# Patient Record
Sex: Female | Born: 1935 | Race: White | Hispanic: No | Marital: Single | Smoking: Never smoker
Health system: Southern US, Community
[De-identification: ages and names within clinical notes are randomized; demographics above are authoritative.]

## PROBLEM LIST (undated history)

## (undated) DIAGNOSIS — I480 Paroxysmal atrial fibrillation: Secondary | ICD-10-CM

## (undated) DIAGNOSIS — C801 Malignant (primary) neoplasm, unspecified: Secondary | ICD-10-CM

## (undated) DIAGNOSIS — I495 Sick sinus syndrome: Secondary | ICD-10-CM

## (undated) DIAGNOSIS — H3500 Unspecified background retinopathy: Secondary | ICD-10-CM

## (undated) DIAGNOSIS — E785 Hyperlipidemia, unspecified: Secondary | ICD-10-CM

## (undated) DIAGNOSIS — R413 Other amnesia: Secondary | ICD-10-CM

## (undated) DIAGNOSIS — R42 Dizziness and giddiness: Secondary | ICD-10-CM

## (undated) DIAGNOSIS — Z950 Presence of cardiac pacemaker: Secondary | ICD-10-CM

## (undated) DIAGNOSIS — N183 Chronic kidney disease, stage 3 unspecified: Secondary | ICD-10-CM

## (undated) DIAGNOSIS — I119 Hypertensive heart disease without heart failure: Secondary | ICD-10-CM

## (undated) DIAGNOSIS — E119 Type 2 diabetes mellitus without complications: Secondary | ICD-10-CM

## (undated) DIAGNOSIS — A419 Sepsis, unspecified organism: Secondary | ICD-10-CM

## (undated) DIAGNOSIS — I10 Essential (primary) hypertension: Secondary | ICD-10-CM

## (undated) DIAGNOSIS — H547 Unspecified visual loss: Secondary | ICD-10-CM

## (undated) HISTORY — DX: Chronic kidney disease, stage 3 (moderate): N18.3

## (undated) HISTORY — DX: Dizziness and giddiness: R42

## (undated) HISTORY — DX: Hyperlipidemia, unspecified: E78.5

## (undated) HISTORY — DX: Chronic kidney disease, stage 3 unspecified: N18.30

## (undated) HISTORY — DX: Hypertensive heart disease without heart failure: I11.9

## (undated) HISTORY — DX: Sick sinus syndrome: I49.5

## (undated) HISTORY — DX: Other amnesia: R41.3

## (undated) HISTORY — PX: DENTAL SURGERY: SHX609

## (undated) HISTORY — DX: Unspecified background retinopathy: H35.00

## (undated) HISTORY — DX: Type 2 diabetes mellitus without complications: E11.9

## (undated) HISTORY — PX: EYE SURGERY: SHX253

---

## 2010-05-11 ENCOUNTER — Encounter: Payer: Self-pay | Admitting: Cardiovascular Disease

## 2010-05-13 ENCOUNTER — Encounter: Payer: Self-pay | Admitting: Cardiovascular Disease

## 2010-07-30 ENCOUNTER — Encounter: Payer: Self-pay | Admitting: Cardiovascular Disease

## 2011-04-01 ENCOUNTER — Encounter: Payer: Self-pay | Admitting: Cardiovascular Disease

## 2011-04-17 ENCOUNTER — Encounter: Payer: Self-pay | Admitting: *Deleted

## 2011-04-17 ENCOUNTER — Encounter: Payer: Self-pay | Admitting: Cardiovascular Disease

## 2011-04-18 ENCOUNTER — Encounter: Payer: Self-pay | Admitting: Cardiovascular Disease

## 2011-04-18 ENCOUNTER — Ambulatory Visit (INDEPENDENT_AMBULATORY_CARE_PROVIDER_SITE_OTHER): Payer: Medicare Other | Admitting: Cardiovascular Disease

## 2011-04-18 VITALS — BP 165/80 | HR 69 | Resp 14 | Ht 62.0 in | Wt 146.0 lb

## 2011-04-18 DIAGNOSIS — R946 Abnormal results of thyroid function studies: Secondary | ICD-10-CM | POA: Insufficient documentation

## 2011-04-18 DIAGNOSIS — I119 Hypertensive heart disease without heart failure: Secondary | ICD-10-CM | POA: Insufficient documentation

## 2011-04-18 DIAGNOSIS — I480 Paroxysmal atrial fibrillation: Secondary | ICD-10-CM

## 2011-04-18 DIAGNOSIS — I10 Essential (primary) hypertension: Secondary | ICD-10-CM

## 2011-04-18 DIAGNOSIS — I4891 Unspecified atrial fibrillation: Secondary | ICD-10-CM

## 2011-04-18 NOTE — Progress Notes (Signed)
77 yo referred by Dr Johnnye Sima.  Previously seen by cardiologist in [facility].  Moved here with demented sister as neither one of them drive and their brother Barnabas Lister lives here.  Reviewed some records from [facility]  Had normal echo.  ? History of PAF.  On amiodarone with suppressed TSH.  No history of CAD or structural heart disease.  Denies palpiatations, SSCP, dyspnea or syncope.  Not on coumadin.  Discussed long term side effects of Amiodarone and especially since TSH abnormal would stop this.  If any evidence of recurrence of afib a drug like flecainide would be better  ROS: Denies fever, malais, weight loss, blurry vision, decreased visual acuity, cough, sputum, SOB, hemoptysis, pleuritic pain, palpitaitons, heartburn, abdominal pain, melena, lower extremity edema, claudication, or rash.  All other systems reviewed and negative   General: Affect appropriate Healthy:  appears stated age 77: normal Neck supple with no adenopathy JVP normal no bruits no thyromegaly Lungs clear with no wheezing and good diaphragmatic motion Heart:  S1/S2 no murmur,rub, gallop or click PMI normal Abdomen: benighn, BS positve, no tenderness, no AAA no bruit.  No HSM or HJR Distal pulses intact with no bruits No edema Neuro non-focal Skin warm and dry No muscular weakness  Medications Current Outpatient Prescriptions  Medication Sig Dispense Refill  . acarbose (PRECOSE) 100 MG tablet Take 100 mg by mouth daily.        Marland Kitchen amLODipine (NORVASC) 5 MG tablet Take 5 mg by mouth daily.        Marland Kitchen glimepiride (AMARYL) 1 MG tablet Take 1 mg by mouth daily before breakfast.        . losartan (COZAAR) 100 MG tablet Take 100 mg by mouth daily.        . metFORMIN (GLUMETZA) 500 MG (MOD) 24 hr tablet Take 500 mg by mouth 2 (two) times daily with a meal.        . metoprolol (TOPROL-XL) 200 MG 24 hr tablet 1/2 tab po qd       . pravastatin (PRAVACHOL) 40 MG tablet Take 40 mg by mouth daily.        Marland Kitchen  telmisartan (MICARDIS) 40 MG tablet Take 40 mg by mouth daily.        Marland Kitchen DISCONTD: amiodarone (PACERONE) 200 MG tablet Take 200 mg by mouth daily.        Marland Kitchen DISCONTD: b complex vitamins capsule Take 1 capsule by mouth daily.        Marland Kitchen DISCONTD: telmisartan (MICARDIS) 40 MG tablet Take 40 mg by mouth daily.          Allergies Review of patient's allergies indicates no known allergies.  Family History: Family History  Problem Relation Age of Onset  . Heart attack Father   . Hypertension Mother   . Cancer Other     Social History: History   Social History  . Marital Status: Married    Spouse Name: N/A    Number of Children: N/A  . Years of Education: N/A   Occupational History  . Not on file.   Social History Main Topics  . Smoking status: Never Smoker   . Smokeless tobacco: Not on file  . Alcohol Use: Not on file  . Drug Use: Not on file  . Sexually Active: Not on file   Other Topics Concern  . Not on file   Social History Narrative  . No narrative on file    Electrocardiogram: NSR 69 PR 232  otherwise normal  Assessment and Plan

## 2011-04-18 NOTE — Patient Instructions (Signed)
Your physician wants you to follow-up in: 6 months You will receive a reminder letter in the mail two months in advance. If you don't receive a letter, please call our office to schedule the follow-up appointment.   STOP PACERONE

## 2011-04-18 NOTE — Assessment & Plan Note (Signed)
Continue calcium blocker and beta blocker May need to change in future to ACE given tendency to brady and long PR

## 2011-04-18 NOTE — Assessment & Plan Note (Signed)
Continue ASA and BB,  Stop amiodarone.

## 2011-04-18 NOTE — Assessment & Plan Note (Signed)
Repeat in 3 months once amiodarone has been stopped today

## 2011-11-14 DIAGNOSIS — Z79899 Other long term (current) drug therapy: Secondary | ICD-10-CM | POA: Diagnosis not present

## 2011-11-14 DIAGNOSIS — I10 Essential (primary) hypertension: Secondary | ICD-10-CM | POA: Diagnosis not present

## 2011-11-14 DIAGNOSIS — E559 Vitamin D deficiency, unspecified: Secondary | ICD-10-CM | POA: Diagnosis not present

## 2011-11-14 DIAGNOSIS — Z1212 Encounter for screening for malignant neoplasm of rectum: Secondary | ICD-10-CM | POA: Diagnosis not present

## 2011-11-14 DIAGNOSIS — E119 Type 2 diabetes mellitus without complications: Secondary | ICD-10-CM | POA: Diagnosis not present

## 2011-11-14 DIAGNOSIS — E782 Mixed hyperlipidemia: Secondary | ICD-10-CM | POA: Diagnosis not present

## 2011-11-25 DIAGNOSIS — E1139 Type 2 diabetes mellitus with other diabetic ophthalmic complication: Secondary | ICD-10-CM | POA: Diagnosis not present

## 2011-11-25 DIAGNOSIS — E11349 Type 2 diabetes mellitus with severe nonproliferative diabetic retinopathy without macular edema: Secondary | ICD-10-CM | POA: Diagnosis not present

## 2011-11-25 DIAGNOSIS — H4310 Vitreous hemorrhage, unspecified eye: Secondary | ICD-10-CM | POA: Diagnosis not present

## 2011-11-25 DIAGNOSIS — E11359 Type 2 diabetes mellitus with proliferative diabetic retinopathy without macular edema: Secondary | ICD-10-CM | POA: Diagnosis not present

## 2012-02-27 DIAGNOSIS — E119 Type 2 diabetes mellitus without complications: Secondary | ICD-10-CM | POA: Diagnosis not present

## 2012-02-27 DIAGNOSIS — E559 Vitamin D deficiency, unspecified: Secondary | ICD-10-CM | POA: Diagnosis not present

## 2012-02-27 DIAGNOSIS — Z79899 Other long term (current) drug therapy: Secondary | ICD-10-CM | POA: Diagnosis not present

## 2012-02-27 DIAGNOSIS — I4891 Unspecified atrial fibrillation: Secondary | ICD-10-CM | POA: Diagnosis not present

## 2012-02-27 DIAGNOSIS — I10 Essential (primary) hypertension: Secondary | ICD-10-CM | POA: Diagnosis not present

## 2012-02-27 DIAGNOSIS — E782 Mixed hyperlipidemia: Secondary | ICD-10-CM | POA: Diagnosis not present

## 2012-03-11 DIAGNOSIS — E1139 Type 2 diabetes mellitus with other diabetic ophthalmic complication: Secondary | ICD-10-CM | POA: Diagnosis not present

## 2012-03-11 DIAGNOSIS — H35439 Paving stone degeneration of retina, unspecified eye: Secondary | ICD-10-CM | POA: Diagnosis not present

## 2012-03-11 DIAGNOSIS — E11359 Type 2 diabetes mellitus with proliferative diabetic retinopathy without macular edema: Secondary | ICD-10-CM | POA: Diagnosis not present

## 2012-03-11 DIAGNOSIS — E11349 Type 2 diabetes mellitus with severe nonproliferative diabetic retinopathy without macular edema: Secondary | ICD-10-CM | POA: Diagnosis not present

## 2012-04-19 ENCOUNTER — Encounter (HOSPITAL_COMMUNITY): Payer: Self-pay

## 2012-04-19 ENCOUNTER — Inpatient Hospital Stay (HOSPITAL_COMMUNITY)
Admission: EM | Admit: 2012-04-19 | Discharge: 2012-04-21 | DRG: 243 | Disposition: A | Discharge status: Home / Self Care | Payer: Medicare Other | Attending: Internal Medicine | Admitting: Internal Medicine

## 2012-04-19 ENCOUNTER — Emergency Department (HOSPITAL_COMMUNITY): Payer: Medicare Other

## 2012-04-19 DIAGNOSIS — E785 Hyperlipidemia, unspecified: Secondary | ICD-10-CM | POA: Diagnosis not present

## 2012-04-19 DIAGNOSIS — I10 Essential (primary) hypertension: Secondary | ICD-10-CM | POA: Diagnosis not present

## 2012-04-19 DIAGNOSIS — I495 Sick sinus syndrome: Principal | ICD-10-CM

## 2012-04-19 DIAGNOSIS — R404 Transient alteration of awareness: Secondary | ICD-10-CM | POA: Diagnosis not present

## 2012-04-19 DIAGNOSIS — R42 Dizziness and giddiness: Secondary | ICD-10-CM | POA: Diagnosis not present

## 2012-04-19 DIAGNOSIS — I4892 Unspecified atrial flutter: Secondary | ICD-10-CM | POA: Diagnosis present

## 2012-04-19 DIAGNOSIS — R55 Syncope and collapse: Secondary | ICD-10-CM | POA: Diagnosis not present

## 2012-04-19 DIAGNOSIS — I4891 Unspecified atrial fibrillation: Secondary | ICD-10-CM | POA: Diagnosis present

## 2012-04-19 DIAGNOSIS — J9819 Other pulmonary collapse: Secondary | ICD-10-CM | POA: Diagnosis not present

## 2012-04-19 DIAGNOSIS — R0602 Shortness of breath: Secondary | ICD-10-CM | POA: Diagnosis not present

## 2012-04-19 DIAGNOSIS — E119 Type 2 diabetes mellitus without complications: Secondary | ICD-10-CM | POA: Diagnosis present

## 2012-04-19 DIAGNOSIS — Z950 Presence of cardiac pacemaker: Secondary | ICD-10-CM

## 2012-04-19 DIAGNOSIS — I499 Cardiac arrhythmia, unspecified: Secondary | ICD-10-CM | POA: Diagnosis not present

## 2012-04-19 DIAGNOSIS — I480 Paroxysmal atrial fibrillation: Secondary | ICD-10-CM

## 2012-04-19 HISTORY — DX: Paroxysmal atrial fibrillation: I48.0

## 2012-04-19 LAB — DIFFERENTIAL
Basophils Absolute: 0 K/uL (ref 0.0–0.1)
Basophils Relative: 0 % (ref 0–1)
Eosinophils Absolute: 0.1 K/uL (ref 0.0–0.7)
Eosinophils Relative: 2 % (ref 0–5)
Lymphocytes Relative: 20 % (ref 12–46)
Lymphs Abs: 1.3 K/uL (ref 0.7–4.0)
Monocytes Absolute: 0.4 K/uL (ref 0.1–1.0)
Monocytes Relative: 6 % (ref 3–12)
Neutro Abs: 4.9 K/uL (ref 1.7–7.7)
Neutrophils Relative %: 72 % (ref 43–77)

## 2012-04-19 LAB — COMPREHENSIVE METABOLIC PANEL
ALT: 13 U/L (ref 0–35)
AST: 14 U/L (ref 0–37)
CO2: 22 mEq/L (ref 19–32)
Chloride: 107 mEq/L (ref 96–112)
Creatinine, Ser: 0.92 mg/dL (ref 0.50–1.10)
GFR calc non Af Amer: 56 mL/min — ABNORMAL LOW (ref >90)
Glucose, Bld: 147 mg/dL — ABNORMAL HIGH (ref 70–99)
Total Bilirubin: 0.2 mg/dL — ABNORMAL LOW (ref 0.3–1.2)

## 2012-04-19 LAB — TSH: TSH: 0.895 u[IU]/mL (ref 0.350–4.500)

## 2012-04-19 LAB — CBC
HCT: 34.9 % — ABNORMAL LOW (ref 36.0–46.0)
Hemoglobin: 12 g/dL (ref 12.0–15.0)
MCH: 30.7 pg (ref 26.0–34.0)
MCHC: 34.4 g/dL (ref 30.0–36.0)
MCV: 89.3 fL (ref 78.0–100.0)
Platelets: 259 K/uL (ref 150–400)
RBC: 3.91 MIL/uL (ref 3.87–5.11)
RDW: 12.6 % (ref 11.5–15.5)
WBC: 6.7 K/uL (ref 4.0–10.5)

## 2012-04-19 LAB — HEMOGLOBIN A1C: Hgb A1c MFr Bld: 6.1 % — ABNORMAL HIGH (ref <5.7)

## 2012-04-19 MED ORDER — CEFAZOLIN SODIUM 1-5 GM-% IV SOLN
1.0000 g | INTRAVENOUS | Status: DC
  Filled 2012-04-19: qty 50

## 2012-04-19 MED ORDER — SODIUM CHLORIDE 0.9 % IV SOLN
INTRAVENOUS | Status: DC
  Administered 2012-04-20: 500 mL via INTRAVENOUS

## 2012-04-19 MED ORDER — SODIUM CHLORIDE 0.9 % IR SOLN
80.0000 mg | Status: DC
  Filled 2012-04-19 (×2): qty 2

## 2012-04-19 MED ORDER — AMLODIPINE BESYLATE 5 MG PO TABS
5.0000 mg | ORAL_TABLET | Freq: Every day | ORAL | Status: DC
  Administered 2012-04-19 – 2012-04-21 (×3): 5 mg via ORAL
  Filled 2012-04-19 (×3): qty 1

## 2012-04-19 MED ORDER — ONDANSETRON HCL 4 MG/2ML IJ SOLN: 4.0000 mg | Freq: Four times a day (QID) | INTRAMUSCULAR | Status: DC | PRN

## 2012-04-19 MED ORDER — CHLORHEXIDINE GLUCONATE 4 % EX LIQD
60.0000 mL | Freq: Once | CUTANEOUS | Status: AC
  Administered 2012-04-20: 4 via TOPICAL
  Filled 2012-04-19: qty 60

## 2012-04-19 MED ORDER — ACARBOSE 100 MG PO TABS
100.0000 mg | ORAL_TABLET | Freq: Every day | ORAL | Status: DC
  Administered 2012-04-19 – 2012-04-21 (×2): 100 mg via ORAL
  Filled 2012-04-19 (×3): qty 1

## 2012-04-19 MED ORDER — SODIUM CHLORIDE 0.45 % IV SOLN: INTRAVENOUS | Status: DC

## 2012-04-19 MED ORDER — LOSARTAN POTASSIUM 50 MG PO TABS
100.0000 mg | ORAL_TABLET | Freq: Every day | ORAL | Status: DC
  Administered 2012-04-20 – 2012-04-21 (×2): 100 mg via ORAL
  Filled 2012-04-19 (×2): qty 2

## 2012-04-19 MED ORDER — ASPIRIN 81 MG PO CHEW
324.0000 mg | CHEWABLE_TABLET | Freq: Once | ORAL | Status: AC
  Administered 2012-04-19: 324 mg via ORAL
  Filled 2012-04-19: qty 4

## 2012-04-19 MED ORDER — SODIUM CHLORIDE 0.9 % IV SOLN
250.0000 mL | INTRAVENOUS | Status: DC | PRN
  Administered 2012-04-21: 250 mL via INTRAVENOUS

## 2012-04-19 MED ORDER — ACETAMINOPHEN 325 MG PO TABS
650.0000 mg | ORAL_TABLET | ORAL | Status: DC | PRN
  Administered 2012-04-20: 650 mg via ORAL
  Filled 2012-04-19: qty 2

## 2012-04-19 MED ORDER — SODIUM CHLORIDE 0.9 % IJ SOLN: 3.0000 mL | INTRAMUSCULAR | Status: DC | PRN

## 2012-04-19 MED ORDER — SODIUM CHLORIDE 0.9 % IJ SOLN
3.0000 mL | Freq: Two times a day (BID) | INTRAMUSCULAR | Status: DC
  Administered 2012-04-19 – 2012-04-21 (×3): 3 mL via INTRAVENOUS

## 2012-04-19 MED ORDER — GLIMEPIRIDE 1 MG PO TABS
1.0000 mg | ORAL_TABLET | Freq: Every day | ORAL | Status: DC
  Administered 2012-04-21: 1 mg via ORAL
  Filled 2012-04-19 (×3): qty 1

## 2012-04-19 MED ORDER — CHLORHEXIDINE GLUCONATE 4 % EX LIQD
60.0000 mL | Freq: Once | CUTANEOUS | Status: AC
  Administered 2012-04-20: 4 via TOPICAL
  Filled 2012-04-19 (×2): qty 60

## 2012-04-19 NOTE — ED Notes (Signed)
EMS reports patient from home, feeling like she is going to pass out, EMS reports patient in AF with brady episode, feeling lightheaded, mild diaphoresis, no CP, no nausea or vomiting

## 2012-04-19 NOTE — ED Provider Notes (Signed)
This 78 year old presents with intermittent presyncope with witnessed episode of lightheadedness with bradycardia to a rate of 40 by EMS, she also has a history of paroxysmal atrial fibrillation and was seen about a year ago [place] and was in sinus rhythm at that time, she now presents with rate controlled atrial fibrillation/flutter today but near-syncope in the ED with a sinus arrest with asystole on minitor for several seconds in the ED, she is spontaneous return of atrial fibrillation flutter with controlled rate of 80 without chest pain shortness of breath or focal neurologic symptoms. She does take metoprolol per dose has not changed recently.  Medical screening examination/treatment/procedurewere conducted as a shared visit with non-physician practitioner(s) and myself.  I personally evaluated the patient during the encounter  ECG: Afib/flutter, ventricular rate 82, normal axis, minimal nonspecific ST depression lateral leads, no comparison ECG available  [name], MD 04/19/12 2052

## 2012-04-19 NOTE — ED Provider Notes (Signed)
History     CSN: [id number]  Arrival date & time 04/19/12  [id number]   First MD Initiated Contact with Patient 04/19/12 [phone]      Chief Complaint  Patient presents with  . Near Syncope    (Consider location/radiation/quality/duration/timing/severity/associated sxs/prior treatment) HPI History from patient, previous chart, and EMS. 78 year old female with past medical history of hypertension and from a proximal atrial fibrillation presents with complaint of near syncope while at home this morning. She states that she got out of bed and was walking to the bathroom when she suddenly felt as if she was going to pass out. She did not actually lose consciousness with this and did not fall. She denies any preceding symptoms. She did not have any accompanying palpitations, chest pain, shortness of breath, diaphoresis, nausea, or vomiting.   She states that she moved to the area about a year ago from [facility]. She recalls having a similar episode while she was living there. She recalls being hospitalized for several days but does not recall what she was diagnosed with. She has seen Dr. Johnsie Cancel with [facility] cardiology once in the past. This note was reviewed and indicates that she has paroxysmal atrial fibrillation.  EMS states that during transport, the patient appeared to be in atrial fibrillation, with possible brief atrial flutter. She had several episodes of bradycardia to rates in the 40s. She was asymptomatic with this.  Past Medical History  Diagnosis Date  . Dizziness   . HTN (hypertension)   . Diabetes mellitus   . Hyperlipidemia   . Cataract     History reviewed. No pertinent past surgical history.  Family History  Problem Relation Age of Onset  . Heart attack Father   . Hypertension Mother   . Cancer Other     History  Substance Use Topics  . Smoking status: Never Smoker   . Smokeless tobacco: Not on file  . Alcohol Use: Not on file    OB History    Grav Para Term  Preterm Abortions TAB SAB Ect Mult Living                  Review of Systems  Constitutional: Negative for fever, chills, activity change and appetite change.  Respiratory: Negative for cough, chest tightness and shortness of breath.   Cardiovascular: Negative for chest pain and palpitations.  Gastrointestinal: Negative for nausea, vomiting and abdominal pain.  Musculoskeletal: Negative for myalgias.  Skin: Negative for color change and rash.  Neurological: Negative for dizziness and weakness.    Allergies  Review of patient's allergies indicates no known allergies.  Home Medications   Current Outpatient Rx  Name Route Sig Dispense Refill  . ACARBOSE 100 MG PO TABS Oral Take 100 mg by mouth daily.      Marland Kitchen AMLODIPINE BESYLATE 5 MG PO TABS Oral Take 5 mg by mouth daily.      Marland Kitchen GLIMEPIRIDE 1 MG PO TABS Oral Take 1 mg by mouth daily before breakfast.      . LOSARTAN POTASSIUM 100 MG PO TABS Oral Take 100 mg by mouth daily.      Marland Kitchen METFORMIN HCL ER (MOD) 500 MG PO TB24 Oral Take 500 mg by mouth 2 (two) times daily with a meal.      . METOPROLOL SUCCINATE ER 200 MG PO TB24  1/2 tab po qd     . PRAVASTATIN SODIUM 40 MG PO TABS Oral Take 40 mg by mouth daily.      Marland Kitchen  TELMISARTAN 40 MG PO TABS Oral Take 40 mg by mouth daily.        BP 144/73  Pulse 84  Temp(Src) 98 F (36.7 C) (Oral)  Resp 16  SpO2 100%  Physical Exam  Nursing note and vitals reviewed. Constitutional: She is oriented to person, place, and time. She appears well-developed and well-nourished. No distress.  HENT:  Head: Normocephalic and atraumatic.  Eyes: EOM are normal. Pupils are equal, round, and reactive to light.  Neck: Normal range of motion.  Cardiovascular: Normal rate, regular rhythm and normal heart sounds.  Exam reveals no gallop and no friction rub.   No murmur heard. Pulmonary/Chest: Effort normal and breath sounds normal. She exhibits no tenderness.  Abdominal: Soft. Bowel sounds are normal. There is  no tenderness. There is no rebound and no guarding.  Musculoskeletal: Normal range of motion.  Neurological: She is alert and oriented to person, place, and time. No cranial nerve deficit. She exhibits normal muscle tone. Coordination normal.  Skin: Skin is warm and dry. She is not diaphoretic.  Psychiatric: She has a normal mood and affect.    ED Course  Procedures (including critical care time)  12 lead ECG interpretation per Dr. Katy Apo note - appears to be intermittently in afib --> sinus on monitor  Labs Reviewed  CBC - Abnormal; Notable for the following:    HCT 34.9 (*)    All other components within normal limits  COMPREHENSIVE METABOLIC PANEL - Abnormal; Notable for the following:    Glucose, Bld 147 (*)    Total Bilirubin 0.2 (*)    GFR calc non Af Amer 56 (*)    GFR calc Af Amer 64 (*)    All other components within normal limits  DIFFERENTIAL  TROPONIN I   Dg Chest Port 1 View  04/19/2012  *RADIOLOGY REPORT*  Clinical Data: Syncope  PORTABLE CHEST - 1 VIEW  Comparison: None.  Findings: Cardiomediastinal silhouette is unremarkable.  No acute infiltrate or pleural effusion.  No pulmonary edema.  Bony thorax is unremarkable.  Mild basilar atelectasis.  Mild atherosclerotic calcifications of thoracic aorta.  IMPRESSION: No acute infiltrate or pulmonary edema.  Mild basilar atelectasis.  Original Report Authenticated By: Lahoma Crocker, M.D.     1. PAF (paroxysmal atrial fibrillation)       MDM  10:05 AM Patient assessed. Hx PAF, currently appears to be in sinus on the monitor. Labs initiated, discussed with Dr. Stevie Kern.  10:09 AM RN alerted me that pt had episode of sinus pause for 6 secs on monitor. Transcutaneous pacing pads to bedside in case needed.  11:42 AM Patient's labs and CXR generally unremarkable. Patient has remained intermittently in sinus, to afib, then back to sinus. No other extensive pauses seen. Page placed to cardiology for consultation and  admission.  11:49 AM Discussed with MD on call for . Will see.      Abran Richard, [place] 04/19/12 1340

## 2012-04-19 NOTE — ED Notes (Signed)
Admitting MD at bedside.

## 2012-04-19 NOTE — H&P (Signed)
ELECTROPHYSIOLOGY ADMIT NOTE     Primary Care Physician: Dr Gaspar Skeeters Primary Cardiologist:  Dr Johnsie Cancel  Admit Date: 04/19/2012  CC:  dizziness  Kylie Sanchez is a 78 y.o. female with a h/o paroxysmal atrial fibrillation who presents for further evaluation of dizziness.  She reports being diagnosed with atrial fibrillation several years ago.  She moved to [place] 1 year ago and has been established with Dr Johnsie Cancel since that time.  She was initially treated with amiodarone and toprol for her afib.  She has never been anticoagulated.  Upon being seen by Dr Johnsie Cancel last year, her amiodarone was discontinued. She reports occasional tachypalpitations for which she typically will take an additional metoprolol.  She does not have major symptoms with her afib however. She reports that she did have tachypalpitations which woke her at around 2am this morning.  She then had abrupt and profound presyncope for several seconds.  She was in bed and did not fall.  She managed to fall asleep.  This am, she reports feeling well.  Several hours later however, she again had abrupt and profound presyncope for several seconds.  She had 3 of these episodes at home which prompted her to come to [facility] ER.  She reports having similar symptoms 2 years ago for which she was seen in an ER and diagnosed with dehydration. This am while in [place], she had return of symptoms and was found to have a 6 second pause upon conversion of atrial fibrillation to sinus rhythm which corresponded to her symptoms.  Presently, she is without complaint.  Today, she denies symptoms of chest pain, shortness of breath, orthopnea, PND, lower extremity edema,  or neurologic sequela. The patient is tolerating medications without difficulties and is otherwise without complaint today.   Past Medical History  Diagnosis Date  . Dizziness   . HTN (hypertension)   . Diabetes mellitus   . Hyperlipidemia   . Cataract   . Paroxysmal atrial  fibrillation    History reviewed. No pertinent past surgical history.     Marland Kitchen acarbose  100 mg Oral Daily  . amLODipine  5 mg Oral Daily  . aspirin  324 mg Oral Once  . glimepiride  1 mg Oral QAC breakfast  . losartan  100 mg Oral Daily  . sodium chloride  3 mL Intravenous Q12H      No Known Allergies  History   Social History  . Marital Status: Married    Spouse Name: N/A    Number of Children: N/A  . Years of Education: N/A   Occupational History  . Not on file.   Social History Main Topics  . Smoking status: Never Smoker   . Smokeless tobacco: Not on file  . Alcohol Use: No  . Drug Use: No  . Sexually Active: Not on file   Other Topics Concern  . Not on file   Social History Narrative  . No narrative on file    Family History  Problem Relation Age of Onset  . Heart attack Father   . Hypertension Mother   . Cancer Other     ROS- All systems are reviewed and negative except as per the HPI above  Physical Exam: Telemetry: Filed Vitals:   04/19/12 1000 04/19/12 1126 04/19/12 1330  BP: 175/77 156/68 144/73  Pulse:  88 84  Temp: 98 F (36.7 C)    TempSrc: Oral    Resp:  17 16  SpO2: 97% 99% 100%  GEN- The patient is pleasant but elderly appearing, alert and oriented x 3 today.   Head- normocephalic, atraumatic Eyes-  Sclera clear, conjunctiva pink Ears- hearing intact Oropharynx- clear Neck- supple,  Lungs- Clear to ausculation bilaterally, normal work of breathing Heart- Regular rate and rhythm, no murmurs, rubs or gallops, PMI not laterally displaced GI- soft, NT, ND, + BS Extremities- no clubbing, cyanosis, or edema MS- age appropriate atrophy Skin- no rash or lesion Psych- euthymic mood, full affect Neuro- strength and sensation are intact  EKG-  Rate controlled afib, QRS 86 msec, QT 456, nonspecific ST/T changes  Labs:   Lab Results  Component Value Date   WBC 6.7 04/19/2012   HGB 12.0 04/19/2012   HCT 34.9* 04/19/2012   MCV 89.3  04/19/2012   PLT 259 04/19/2012    Lab 04/19/12 1025  NA 141  K 4.2  CL 107  CO2 22  BUN 20  CREATININE 0.92  CALCIUM 9.4  PROT 6.9  BILITOT 0.2*  ALKPHOS 75  ALT 13  AST 14  GLUCOSE 147*   Lab Results  Component Value Date   TROPONINI <0.30 04/19/2012    Dr Mariana Arn office note is reviewed  ASSESSMENT AND PLAN:   1. Presyncope-  The patient presents with presyncope and is documented to have post termination pause of 6 seconds.  She has a h/o afib with tachypalpitations and will require AV nodal agents long term. I would therefore recommend pacemaker implantation at this time.  Risks, benefits, alternatives to pacemaker implantation were discussed in detail with the patient today. The patient understands that the risks include but are not limited to bleeding, infection, pneumothorax, perforation, tamponade, vascular damage, renal failure, MI, stroke, death,  and lead dislodgement and wishes to proceed. We will therefore schedule the procedure tomorrow.  2. Afib/ atrial flutter-  The patient has documented afib and atrial flutter.  She was previously on amiodarone but has done reasonably well with rate control alone since she saw Dr Johnsie Cancel 2012.  Her CHADS2 score is 3.  She should therefore be placed on anticoagulation.  I discussed coumadin/ pradaxa/ xarelto/ and apixaban with her today.  She reached the "donut hole" every year and therefore I would not recommend a novel anticoagulant at this point.   She will discuss coumadin initiation with her family and let me know tomorrow if she is willing to consider long term anticoagulation. Hold metoprolol for now.  Will restart s/p PPM.  3. HTN- above goal Resume home medicines except for metoprolol and follow  4. DM- follow bs with hospitalization Check A1C.  Admit to telemetry   Thompson Grayer, MD 04/19/2012  2:07 PM

## 2012-04-19 NOTE — ED Notes (Signed)
Xray at the bedside.

## 2012-04-20 ENCOUNTER — Encounter (HOSPITAL_COMMUNITY)
Admission: EM | Disposition: A | Discharge status: Home / Self Care | Payer: Self-pay | Source: Home / Self Care | Attending: Internal Medicine

## 2012-04-20 DIAGNOSIS — I495 Sick sinus syndrome: Secondary | ICD-10-CM

## 2012-04-20 HISTORY — PX: PERMANENT PACEMAKER INSERTION: SHX5480

## 2012-04-20 HISTORY — PX: PACEMAKER INSERTION: SHX728

## 2012-04-20 LAB — CBC
HCT: 35.9 % — ABNORMAL LOW (ref 36.0–46.0)
Hemoglobin: 11.9 g/dL — ABNORMAL LOW (ref 12.0–15.0)
MCHC: 33.1 g/dL (ref 30.0–36.0)
MCV: 90.2 fL (ref 78.0–100.0)
RDW: 12.7 % (ref 11.5–15.5)

## 2012-04-20 LAB — GLUCOSE, CAPILLARY: Glucose-Capillary: 233 mg/dL — ABNORMAL HIGH (ref 70–99)

## 2012-04-20 LAB — PROTIME-INR
INR: 0.93 (ref 0.00–1.49)
Prothrombin Time: 12.7 seconds (ref 11.6–15.2)

## 2012-04-20 LAB — BASIC METABOLIC PANEL
BUN: 22 mg/dL (ref 6–23)
Creatinine, Ser: 0.95 mg/dL (ref 0.50–1.10)
GFR calc Af Amer: 62 mL/min — ABNORMAL LOW (ref >90)
GFR calc non Af Amer: 53 mL/min — ABNORMAL LOW (ref >90)
Glucose, Bld: 132 mg/dL — ABNORMAL HIGH (ref 70–99)

## 2012-04-20 SURGERY — PERMANENT PACEMAKER INSERTION
Anesthesia: LOCAL

## 2012-04-20 MED ORDER — ACETAMINOPHEN 325 MG PO TABS: 325.0000 mg | ORAL_TABLET | ORAL | Status: DC | PRN

## 2012-04-20 MED ORDER — ONDANSETRON HCL 4 MG/2ML IJ SOLN: 4.0000 mg | Freq: Four times a day (QID) | INTRAMUSCULAR | Status: DC | PRN

## 2012-04-20 MED ORDER — METOPROLOL TARTRATE 1 MG/ML IV SOLN
5.0000 mg | INTRAVENOUS | Status: DC | PRN
  Filled 2012-04-20: qty 5

## 2012-04-20 MED ORDER — CEFAZOLIN SODIUM 1-5 GM-% IV SOLN
INTRAVENOUS | Status: AC
  Filled 2012-04-20: qty 50

## 2012-04-20 MED ORDER — WARFARIN - PHYSICIAN DOSING INPATIENT: Freq: Every day | Status: DC

## 2012-04-20 MED ORDER — FENTANYL CITRATE 0.05 MG/ML IJ SOLN
INTRAMUSCULAR | Status: AC
  Filled 2012-04-20: qty 2

## 2012-04-20 MED ORDER — HEPARIN (PORCINE) IN NACL 2-0.9 UNIT/ML-% IJ SOLN
INTRAMUSCULAR | Status: AC
  Filled 2012-04-20: qty 1000

## 2012-04-20 MED ORDER — MIDAZOLAM HCL 5 MG/5ML IJ SOLN
INTRAMUSCULAR | Status: AC
  Filled 2012-04-20: qty 5

## 2012-04-20 MED ORDER — LIDOCAINE HCL (PF) 1 % IJ SOLN
INTRAMUSCULAR | Status: AC
  Filled 2012-04-20: qty 60

## 2012-04-20 MED ORDER — METOPROLOL SUCCINATE ER 100 MG PO TB24
100.0000 mg | ORAL_TABLET | Freq: Every day | ORAL | Status: DC
  Administered 2012-04-20 – 2012-04-21 (×2): 100 mg via ORAL
  Filled 2012-04-20 (×2): qty 1

## 2012-04-20 MED ORDER — METFORMIN HCL 500 MG PO TABS
1000.0000 mg | ORAL_TABLET | Freq: Two times a day (BID) | ORAL | Status: DC
  Administered 2012-04-21: 1000 mg via ORAL
  Filled 2012-04-20 (×4): qty 2

## 2012-04-20 MED ORDER — HYDROCODONE-ACETAMINOPHEN 5-325 MG PO TABS: 1.0000 | ORAL_TABLET | ORAL | Status: DC | PRN

## 2012-04-20 MED ORDER — CEFAZOLIN SODIUM 1-5 GM-% IV SOLN
1.0000 g | Freq: Four times a day (QID) | INTRAVENOUS | Status: DC
  Administered 2012-04-21 (×2): 1 g via INTRAVENOUS
  Filled 2012-04-20 (×4): qty 50

## 2012-04-20 MED ORDER — WARFARIN SODIUM 5 MG PO TABS
5.0000 mg | ORAL_TABLET | Freq: Every day | ORAL | Status: DC
  Administered 2012-04-20: 5 mg via ORAL
  Filled 2012-04-20 (×2): qty 1

## 2012-04-20 NOTE — Progress Notes (Signed)
Nutrition Brief Note  RD pulled to patient due to Nutrition Risk Report: problems chewing or swallowing foods and/or liquids. Patient states she does not have any chewing or swallowing difficulty. Currently NPO, however, reports a good appetite. BMI = 27.5 kg/m2. No nutrition intervention indicated at this time. Please consult RD as needed.  Lady Deutscher Pager #: [phone]

## 2012-04-20 NOTE — Interval H&P Note (Signed)
History and Physical Interval Note:  04/20/2012 12:44 PM  Kylie Sanchez  has presented today for surgery, with the diagnosis of syncope  The various methods of treatment have been discussed with the patient and family. After consideration of risks, benefits and other options for treatment, the patient has consented to  Procedure(s) (LRB): PERMANENT PACEMAKER INSERTION (N/A) as a surgical intervention .  The patients' history has been reviewed, patient examined, no change in status, stable for surgery.  I have reviewed the patients' chart and labs.  Questions were answered to the patient's satisfaction.     Thompson Grayer

## 2012-04-20 NOTE — Progress Notes (Signed)
UR Completed.  Vergie Living [id number] 04/20/2012

## 2012-04-20 NOTE — Op Note (Signed)
SURGEON:  Thompson Grayer, MD     PREPROCEDURE DIAGNOSIS:  Sick sinus syndrome, tachycardia bradycardia syndrome, paroxysmal atrial fibrillation    POSTPROCEDURE DIAGNOSIS:  Sick sinus syndrome, tachycardia bradycardia syndrome, paroxysmal atrial fibrillation     PROCEDURES:   1.  Pacemaker implantation.     INTRODUCTION: Kylie Sanchez is a 78 y.o. female  with a history of paroxysmal atrial fibrillation and symptomatic post termination pauses who presents today for pacemaker implantation.  The patient reports intermittent episodes of dizziness over the past few days.  She has been observed to have post termination pauses of 6 seconds in duration with presyncope.  She requires AV nodal agents long term due to symptomatic tachycardia.  The patient therefore presents today for pacemaker implantation.     DESCRIPTION OF PROCEDURE:  Informed written consent was obtained, and the patient was brought to the electrophysiology lab in a fasting state.  The patient was adequately sedated with IV versed and fentanyl for the procedure today.  The patients left chest was prepped and draped in the usual sterile fashion by the EP lab staff. The skin overlying the left deltopectoral region was infiltrated with lidocaine for local analgesia.  A 4-cm incision was made over the left deltopectoral region.  A left subcutaneous pacemaker pocket was fashioned using a combination of sharp and blunt dissection. Electrocautery was required to assure hemostasis.    RA/RV Lead Placement: The left axillary vein was cannulated with fluoroscopic visualization.  Through the left axillary vein, a Medtronic model [id number] (serial number [id number]) right atrial lead and a Medtronic model [id number] (serial number [id number]) right ventricular lead were advanced with fluoroscopic visualization into the right atrial appendage and right ventricular apex positions respectively.  Initial atrial lead P- waves measured 3 mV with impedance of 760  ohms and a threshold of 1.1 V at 0.5 msec.  Right ventricular lead R-waves measured 28 mV with an impedance of 837 ohms and a threshold of 0.4 V at 0.5 msec.  Both leads were secured to the pectoralis fascia using #2-0 silk over the suture sleeves.   Device Placement:  The leads were then connected to a Medtronic Adapta L model ADDRL 1 (serial number [id number]) pacemaker.  The pocket was irrigated with copious gentamicin solution.  The pacemaker was then placed into the pocket.  The pocket was then closed in 2 layers with 2.0 Vicryl suture for the subcutaneous and subcuticular layers.  Steri- Strips and a sterile dressing were then applied.  There were no early apparent complications.     CONCLUSIONS:   1. Successful implantation of a Medtronic Adapta L dual-chamber pacemaker for sick sinus syndrome, tachycardia bradycardia syndrome, and paroxysmal atrial fibrillation  2. No early apparent complications.           Thompson Grayer, MD 04/20/2012 3:47 PM

## 2012-04-20 NOTE — Progress Notes (Signed)
04/20/2012 8:34 PM Pt hr 110'S with occasional elevation to the 120's, not sustained and noted to be in atrial fibrillation on EKG at 1940 this evening.  Pt had a dual chamber PPM placed today.  Pt BP 162/90.  Pt had not had her dose of 100mg  metoprolol XL or her Coumadin dosage.  Both medications were administered to the patient and the MD on call made aware. New orders received. Will continue to closely monitor. Hively, Luciano Cutter, RN

## 2012-04-20 NOTE — H&P (View-Only) (Signed)
Patient: Kylie Sanchez Date of Encounter: 04/20/2012, 7:46 AM Admit date: 04/19/2012     Subjective  Kylie Sanchez has no complaints this AM. She denies chest pain, shortness of breath, palpitations or dizziness.   Objective   Filed Vitals:   04/20/12 0506  BP: 141/67  Pulse: 80  Temp: 98 F (36.7 C)  Resp: 18    Physical Exam: General: Well developed, well appearing 78 year old female in no acute distress. Head: Normocephalic, atraumatic, sclera non-icteric, no xanthomas, nares are without discharge.  Neck: Supple. JVD not elevated. Lungs: Clear bilaterally to auscultation without wheezes, rales, or rhonchi. Breathing is unlabored. Heart: RRR S1 S2 without murmurs, rubs, or gallops.  Abdomen: Soft, non-distended . Extremities: No clubbing or cyanosis. No edema.  Distal pedal pulses are 2+ and equal bilaterally. Neuro: Alert and oriented X 3. Moves all extremities spontaneously. No focal deficits. Psych:  Responds to questions appropriately with a normal affect.  Inpatient Medications:  . acarbose  100 mg Oral Daily  . amlodipine  5 mg Oral Daily  . aspirin  324 mg Oral Once  . cefazolin (ANCEF) IV  1 g Intravenous On Call  . chlorhexidine  60 mL Topical Once  . chlorhexidine  60 mL Topical Once  . gentamicin irrigation  80 mg Irrigation On Call  . glimepiride  1 mg Oral QAC breakfast  . losartan  100 mg Oral Daily  . sodium chloride  3 mL Intravenous Q12H   Labs:  [facility] 04/20/12 0520 04/19/12 1025  NA 141 141  K 4.1 4.2  CL 106 107  CO2 23 22  GLUCOSE 132* 147*  BUN 22 20  CREATININE 0.95 0.92  CALCIUM 9.5 9.4  MG -- --  PHOS -- --    Basename 04/19/12 1025  AST 14  ALT 13  ALKPHOS 75  BILITOT 0.2*  PROT 6.9  ALBUMIN 3.5    Basename 04/20/12 0520 04/19/12 1025  WBC 6.3 6.7  NEUTROABS -- 4.9  HGB 11.9* 12.0  HCT 35.9* 34.9*  MCV 90.2 89.3  PLT 244 259    Basename 04/19/12 1025  CKTOTAL --  CKMB --  TROPONINI <0.30    Basename 04/19/12 1409   HGBA1C 6.1*   Basename 04/19/12 1409  TSH 0.895    Radiology/Studies: Dg Chest Port 1 View  04/19/2012  *RADIOLOGY REPORT*  Clinical Data: Syncope  PORTABLE CHEST - 1 VIEW  Comparison: None.  Findings: Cardiomediastinal silhouette is unremarkable.  No acute infiltrate or pleural effusion.  No pulmonary edema.  Bony thorax is unremarkable.  Mild basilar atelectasis.  Mild atherosclerotic calcifications of thoracic aorta.  IMPRESSION: No acute infiltrate or pulmonary edema.  Mild basilar atelectasis.  Original Report Authenticated By: Lahoma Crocker, M.D.   Telemetry: normal sinus rhythm with first degree AV block   Assessment and Plan  1. Presyncope- The patient presents with presyncope and is documented to have post termination pause of 6 seconds. She has a h/o afib with tachypalpitations and will require AV nodal agents long term. Dr. Rayann Heman has recommended pacemaker implantation at this time. Risks, benefits, alternatives to pacemaker implantation were discussed in detail with the patient yesterday. The patient understands that the risks include but are not limited to bleeding, infection, pneumothorax, perforation, tamponade, vascular damage, renal failure, MI, stroke, death, and lead dislodgement and wishes to proceed. We will plan for PPM today.   2. Afib/ atrial flutter- The patient has documented afib and atrial flutter. She was previously  on amiodarone but has done reasonably well with rate control alone since she saw Dr Johnsie Cancel 2012. Her CHADS2 score is 3. She should therefore be placed on anticoagulation. I discussed coumadin/ pradaxa/ xarelto/ and apixaban with her today. She reached the "donut hole" every year and therefore I would not recommend a novel anticoagulant at this point. She will discuss coumadin initiation with her family and decide today if she is willing to consider long term anticoagulation. Hold metoprolol for now. Will restart s/p PPM.   Signed, EDMISTEN, BROOKE PA-C  I  have seen, examined the patient, and reviewed the above assessment and plan.  Doing well today.  Risks of PPM reviewed. She will need a left arm IV.  We weill proceed with PPM today.  Co Sign: Thompson Grayer, MD 04/20/2012 12:43 PM

## 2012-04-20 NOTE — Progress Notes (Signed)
Patient: Kylie Sanchez Date of Encounter: 04/20/2012, 7:46 AM Admit date: 04/19/2012     Subjective  Ms. Boozer has no complaints this AM. She denies chest pain, shortness of breath, palpitations or dizziness.   Objective   Filed Vitals:   04/20/12 0506  BP: 141/67  Pulse: 80  Temp: 98 F (36.7 C)  Resp: 18    Physical Exam: General: Well developed, well appearing 78 year old female in no acute distress. Head: Normocephalic, atraumatic, sclera non-icteric, no xanthomas, nares are without discharge.  Neck: Supple. JVD not elevated. Lungs: Clear bilaterally to auscultation without wheezes, rales, or rhonchi. Breathing is unlabored. Heart: RRR S1 S2 without murmurs, rubs, or gallops.  Abdomen: Soft, non-distended . Extremities: No clubbing or cyanosis. No edema.  Distal pedal pulses are 2+ and equal bilaterally. Neuro: Alert and oriented X 3. Moves all extremities spontaneously. No focal deficits. Psych:  Responds to questions appropriately with a normal affect.  Inpatient Medications:  . acarbose  100 mg Oral Daily  . amlodipine  5 mg Oral Daily  . aspirin  324 mg Oral Once  . cefazolin (ANCEF) IV  1 g Intravenous On Call  . chlorhexidine  60 mL Topical Once  . chlorhexidine  60 mL Topical Once  . gentamicin irrigation  80 mg Irrigation On Call  . glimepiride  1 mg Oral QAC breakfast  . losartan  100 mg Oral Daily  . sodium chloride  3 mL Intravenous Q12H   Labs:  [facility] 04/20/12 0520 04/19/12 1025  NA 141 141  K 4.1 4.2  CL 106 107  CO2 23 22  GLUCOSE 132* 147*  BUN 22 20  CREATININE 0.95 0.92  CALCIUM 9.5 9.4  MG -- --  PHOS -- --    Basename 04/19/12 1025  AST 14  ALT 13  ALKPHOS 75  BILITOT 0.2*  PROT 6.9  ALBUMIN 3.5    Basename 04/20/12 0520 04/19/12 1025  WBC 6.3 6.7  NEUTROABS -- 4.9  HGB 11.9* 12.0  HCT 35.9* 34.9*  MCV 90.2 89.3  PLT 244 259    Basename 04/19/12 1025  CKTOTAL --  CKMB --  TROPONINI <0.30    Basename 04/19/12 1409   HGBA1C 6.1*   Basename 04/19/12 1409  TSH 0.895    Radiology/Studies: Dg Chest Port 1 View  04/19/2012  *RADIOLOGY REPORT*  Clinical Data: Syncope  PORTABLE CHEST - 1 VIEW  Comparison: None.  Findings: Cardiomediastinal silhouette is unremarkable.  No acute infiltrate or pleural effusion.  No pulmonary edema.  Bony thorax is unremarkable.  Mild basilar atelectasis.  Mild atherosclerotic calcifications of thoracic aorta.  IMPRESSION: No acute infiltrate or pulmonary edema.  Mild basilar atelectasis.  Original Report Authenticated By: Lahoma Crocker, M.D.   Telemetry: normal sinus rhythm with first degree AV block   Assessment and Plan  1. Presyncope- The patient presents with presyncope and is documented to have post termination pause of 6 seconds. She has a h/o afib with tachypalpitations and will require AV nodal agents long term. Dr. Rayann Heman has recommended pacemaker implantation at this time. Risks, benefits, alternatives to pacemaker implantation were discussed in detail with the patient yesterday. The patient understands that the risks include but are not limited to bleeding, infection, pneumothorax, perforation, tamponade, vascular damage, renal failure, MI, stroke, death, and lead dislodgement and wishes to proceed. We will plan for PPM today.   2. Afib/ atrial flutter- The patient has documented afib and atrial flutter. She was previously  on amiodarone but has done reasonably well with rate control alone since she saw Dr Johnsie Cancel 2012. Her CHADS2 score is 3. She should therefore be placed on anticoagulation. I discussed coumadin/ pradaxa/ xarelto/ and apixaban with her today. She reached the "donut hole" every year and therefore I would not recommend a novel anticoagulant at this point. She will discuss coumadin initiation with her family and decide today if she is willing to consider long term anticoagulation. Hold metoprolol for now. Will restart s/p PPM.   Signed, EDMISTEN, BROOKE PA-C  I  have seen, examined the patient, and reviewed the above assessment and plan.  Doing well today.  Risks of PPM reviewed. She will need a left arm IV.  We weill proceed with PPM today.  Co Sign: Thompson Grayer, MD 04/20/2012 12:43 PM

## 2012-04-21 ENCOUNTER — Inpatient Hospital Stay (HOSPITAL_COMMUNITY): Payer: Medicare Other

## 2012-04-21 LAB — PROTIME-INR
INR: 0.97 (ref 0.00–1.49)
Prothrombin Time: 13.1 seconds (ref 11.6–15.2)

## 2012-04-21 LAB — GLUCOSE, CAPILLARY: Glucose-Capillary: 164 mg/dL — ABNORMAL HIGH (ref 70–99)

## 2012-04-21 MED ORDER — WARFARIN SODIUM 5 MG PO TABS: 5.0000 mg | ORAL_TABLET | Freq: Every day | ORAL | Status: DC

## 2012-04-21 MED ORDER — METOPROLOL SUCCINATE ER 100 MG PO TB24: 100.0000 mg | ORAL_TABLET | Freq: Every day | ORAL | Status: DC

## 2012-04-21 MED ORDER — COUMADIN BOOK
Freq: Once | Status: AC
  Administered 2012-04-21: 1
  Filled 2012-04-21: qty 1

## 2012-04-21 MED ORDER — WARFARIN VIDEO: 1.0000 | Freq: Once | Status: DC

## 2012-04-21 MED ORDER — WARFARIN VIDEO
Freq: Once | Status: AC
  Administered 2012-04-21: 1

## 2012-04-21 NOTE — Discharge Summary (Signed)
ELECTROPHYSIOLOGY DISCHARGE SUMMARY    Patient ID: Kylie Sanchez,  MRN: [id number], DOB/AGE: 12-Sep-1925 78 y.o.  Admit date: 04/19/2012 Discharge date: 04/21/2012  Primary Cardiologist: Dr. Johnsie Cancel Primary Electrophysiologist: Dr. Rayann Heman  Primary Discharge Diagnosis:  1. Tachy-brady syndrome s/p PPM implantation 2. Atrial fibrillation  Secondary Discharge Diagnoses:  1. HTN 2. DM 3. Dyslipidemia 4. Dizziness  Procedures This Admission:  1. Dual chamber PPM implantation 04/20/2012 Medtronic model [phone] (serial number [id number]) right atrial lead. Medtronic model [phone] (serial number [id number]) right ventricular lead. Initial atrial lead P- waves measured 3 mV with impedance of 760 ohms and a threshold of 1.1 V at 0.5 msec. Right ventricular lead R-waves measured 28 mV with an impedance of 837 ohms and a threshold of 0.4 V at 0.5 msec. Medtronic Adapta L model ADDRL 1 (serial number [id number]) pacemaker  History and Hospital Course:  Kylie Sanchez is an 78 year old woman with PAF who was admitted over the weekend with syncope. She was found to have atrial fibrillation with post termination pauses of 6 seconds. Permanent pacemaker implantation was recommended. She underwent dual chamber PPM implantation 04/20/2012. She tolerated the procedure well and there were no immediate complications. She remains hemodynamically stable and afebrile. Her chest x-ray shows stable lead placement without pneumothorax. Her device interrogation shows normal PPM function with stable lead measurements. Her implant site is intact without significant bleeding or hematoma. She was restarted on her beta blocker for rate control in addition to warfarin for embolic prophylaxis. She was counseled regarding wound care and device follow-up. She has been seen, examined and deemed stable for discharge today by Dr. Thompson Grayer.  Discharge Vitals: Blood pressure 162/81, pulse 79, temperature 98.5 F (36.9 C), temperature  source Oral, resp. rate 16, height 5\' 2"  (1.575 m), weight 150 lb 2.1 oz (68.1 kg), SpO2 96.00%.   Labs: Lab Results  Component Value Date   WBC 6.3 04/20/2012   HGB 11.9* 04/20/2012   HCT 35.9* 04/20/2012   MCV 90.2 04/20/2012   PLT 244 04/20/2012    Lab 04/20/12 0520 04/19/12 1025  NA 141 --  K 4.1 --  CL 106 --  CO2 23 --  BUN 22 --  CREATININE 0.95 --  CALCIUM 9.5 --  PROT -- 6.9  BILITOT -- 0.2*  ALKPHOS -- 75  ALT -- 13  AST -- 14  GLUCOSE 132* --   Lab Results  Component Value Date   TROPONINI <0.30 04/19/2012     Basename 04/21/12 0500  INR 0.97    Disposition:  The patient is being discharged in stable condition.  Follow-up: Follow-up Information    Follow up with [facility] on 04/24/2012. (At 9:30 AM for Coumadin follow-up (INR check))    Contact information:   [place] [phone]   Follow up with [facility] on 04/30/2012. (At 12:00 noon for wound check)    Contact information:   [place] [phone]    Follow up with Thompson Grayer, MD on 07/31/2012. (At 9:30 AM)    Contact information:   [address], [place] [phone]   Discharge Medications:  Medication List  As of 04/21/2012  9:12 AM   TAKE these medications         acarbose 100 MG tablet   Commonly known as: PRECOSE   Take 100  mg by mouth daily.      amLODipine 5 MG tablet   Commonly known as: NORVASC   Take 5 mg by mouth daily.      glimepiride 1 MG tablet   Commonly known as: AMARYL   Take 1 mg by mouth daily before breakfast.      losartan 100 MG tablet   Commonly known as: COZAAR   Take 100 mg by mouth daily.      metFORMIN 500 MG tablet   Commonly known as: GLUCOPHAGE   Take 1,000 mg by mouth 2 (two) times daily with a meal.      metoprolol succinate 100 MG 24 hr tablet   Commonly known as: TOPROL-XL    Take 1 tablet (100 mg total) by mouth daily. Take with or immediately following a meal.      pravastatin 40 MG tablet   Commonly known as: PRAVACHOL   Take 40 mg by mouth daily.      telmisartan 40 MG tablet   Commonly known as: MICARDIS   Take 40 mg by mouth daily.      warfarin 5 MG tablet   Commonly known as: COUMADIN   Take 1 tablet (5 mg total) by mouth daily at 6 PM.      Duration of Discharge Encounter: Greater than 30 minutes including physician time.  Signed, Ileene Hutchinson, PA-C 04/21/2012, 9:12 AM  I have seen, examined the patient, and reviewed the above assessment and plan.   Co Sign: Thompson Grayer, MD 04/23/2012 7:49 AM

## 2012-04-21 NOTE — Progress Notes (Addendum)
SUBJECTIVE: The patient is doing well today.  At this time, she denies chest pain, shortness of breath, or any new concerns.  She wants to go home.     Marland Kitchen acarbose  100 mg Oral Daily  . amLODipine  5 mg Oral Daily  . ceFAZolin      .  ceFAZolin (ANCEF) IV  1 g Intravenous Q6H  . fentaNYL      . glimepiride  1 mg Oral QAC breakfast  . heparin      . lidocaine      . losartan  100 mg Oral Daily  . metFORMIN  1,000 mg Oral BID WC  . metoprolol  100 mg Oral Daily  . midazolam      . sodium chloride  3 mL Intravenous Q12H  . warfarin  5 mg Oral q1800  . Warfarin - Physician Dosing Inpatient   Does not apply q1800  . DISCONTD:  ceFAZolin (ANCEF) IV  1 g Intravenous On Call  . DISCONTD: gentamicin irrigation  80 mg Irrigation On Call      . DISCONTD: sodium chloride    . DISCONTD: sodium chloride 500 mL (04/20/12 0501)    OBJECTIVE: Physical Exam: Filed Vitals:   04/20/12 1703 04/20/12 1953 04/20/12 2002 04/21/12 0432  BP: 139/77 157/79 162/90 162/81  Pulse: 79 105 111 79  Temp:  98.3 F (36.8 C)  98.5 F (36.9 C)  TempSrc:  Oral  Oral  Resp:  12  16  Height:      Weight:      SpO2:  91%  96%    Intake/Output Summary (Last 24 hours) at 04/21/12 0745 Last data filed at 04/20/12 1800  Gross per 24 hour  Intake    360 ml  Output      0 ml  Net    360 ml    Telemetry reveals sinus rhythm with occasional afib.   GEN- The patient is well appearing, alert and oriented x 3 today.   Head- normocephalic, atraumatic Eyes-  Sclera clear, conjunctiva pink Ears- hearing intact Oropharynx- clear Neck- supple, no JVP Lymph- no cervical lymphadenopathy Lungs- Clear to ausculation bilaterally, normal work of breathing Heart- Regular rate and rhythm, no murmurs, rubs or gallops, PMI not laterally displaced GI- soft, NT, ND, + BS Extremities- no clubbing, cyanosis, or edema Pacemaker pocket is without hematoma  CXR- no obvious PTx, stable leads  LABS: Basic Metabolic  Panel:  Basename 04/20/12 0520 04/19/12 1025  NA 141 141  K 4.1 4.2  CL 106 107  CO2 23 22  GLUCOSE 132* 147*  BUN 22 20  CREATININE 0.95 0.92  CALCIUM 9.5 9.4  MG -- --  PHOS -- --   Liver Function Tests:  Basename 04/19/12 1025  AST 14  ALT 13  ALKPHOS 75  BILITOT 0.2*  PROT 6.9  ALBUMIN 3.5   No results found for this basename: LIPASE:2,AMYLASE:2 in the last 72 hours CBC:  Basename 04/20/12 0520 04/19/12 1025  WBC 6.3 6.7  NEUTROABS -- 4.9  HGB 11.9* 12.0  HCT 35.9* 34.9*  MCV 90.2 89.3  PLT 244 259   Cardiac Enzymes:  Basename 04/19/12 1025  CKTOTAL --  CKMB --  CKMBINDEX --  TROPONINI <0.30   BNP: No components found with this basename: POCBNP:3 D-Dimer: No results found for this basename: DDIMER:2 in the last 72 hours Hemoglobin A1C:  Basename 04/19/12 1409  HGBA1C 6.1*   Fasting Lipid Panel: No results found for this  basename: CHOL,HDL,LDLCALC,TRIG,CHOLHDL,LDLDIRECT in the last 72 hours Thyroid Function Tests:  Basename 04/19/12 1409  TSH 0.895  T4TOTAL --  T3FREE --  THYROIDAB --   Anemia Panel: No results found for this basename: VITAMINB12,FOLATE,FERRITIN,TIBC,IRON,RETICCTPCT in the last 72 hours    ASSESSMENT AND PLAN:  Active Problems:  Tachycardia-bradycardia  Dizziness  Diabetes mellitus  1. Afib with tachy/brady syndrome- doing well s/p PPM Restart toprol xl 100mg  daily Starting on coumadin 5mg  daily.  She will need an INR check on Friday pacmaker interrogation reviewed and is normal today  DC to home Routine wound care and follow-up She should re-establish with Dr Johnsie Cancel in 4-6 weeks.    Thompson Grayer, MD 04/21/2012 7:45 AM

## 2012-04-22 ENCOUNTER — Telehealth: Payer: Self-pay | Admitting: Internal Medicine

## 2012-04-22 MED ORDER — AMLODIPINE BESYLATE 5 MG PO TABS: 5.0000 mg | ORAL_TABLET | Freq: Every day | ORAL | Status: DC

## 2012-04-22 MED ORDER — TELMISARTAN 40 MG PO TABS: 40.0000 mg | ORAL_TABLET | Freq: Every day | ORAL | Status: DC

## 2012-04-22 MED ORDER — PRAVASTATIN SODIUM 40 MG PO TABS: 40.0000 mg | ORAL_TABLET | Freq: Every day | ORAL | Status: DC

## 2012-04-22 NOTE — Telephone Encounter (Signed)
Pt calling re amlodipine, pravastatin, and mycardis were on here dc list but she does not have rx's for them ,she uses [facility] [place], pls call pt and let her know if she needs these and if they will be called in

## 2012-04-22 NOTE — Telephone Encounter (Signed)
Patient aware to continue

## 2012-04-24 ENCOUNTER — Other Ambulatory Visit: Payer: Self-pay | Admitting: *Deleted

## 2012-04-24 MED ORDER — TELMISARTAN 40 MG PO TABS: 40.0000 mg | ORAL_TABLET | Freq: Every day | ORAL | Status: DC

## 2012-04-30 ENCOUNTER — Ambulatory Visit (INDEPENDENT_AMBULATORY_CARE_PROVIDER_SITE_OTHER): Payer: Medicare Other | Admitting: *Deleted

## 2012-04-30 ENCOUNTER — Encounter: Payer: Self-pay | Admitting: Internal Medicine

## 2012-04-30 DIAGNOSIS — I495 Sick sinus syndrome: Secondary | ICD-10-CM

## 2012-04-30 DIAGNOSIS — Z7901 Long term (current) use of anticoagulants: Secondary | ICD-10-CM

## 2012-04-30 DIAGNOSIS — I4891 Unspecified atrial fibrillation: Secondary | ICD-10-CM

## 2012-04-30 DIAGNOSIS — Z9229 Personal history of other drug therapy: Secondary | ICD-10-CM | POA: Insufficient documentation

## 2012-04-30 LAB — PACEMAKER DEVICE OBSERVATION
AL IMPEDENCE PM: 422 Ohm
AL THRESHOLD: 0.625 V
ATRIAL PACING PM: 5
RV LEAD IMPEDENCE PM: 714 Ohm
VENTRICULAR PACING PM: 4

## 2012-04-30 LAB — POCT INR: INR: 6.6

## 2012-04-30 NOTE — Progress Notes (Signed)
Wound check pacer in clinic  

## 2012-04-30 NOTE — Patient Instructions (Signed)

## 2012-05-06 ENCOUNTER — Ambulatory Visit (INDEPENDENT_AMBULATORY_CARE_PROVIDER_SITE_OTHER): Payer: Medicare Other | Admitting: *Deleted

## 2012-05-06 DIAGNOSIS — E1139 Type 2 diabetes mellitus with other diabetic ophthalmic complication: Secondary | ICD-10-CM | POA: Diagnosis not present

## 2012-05-06 DIAGNOSIS — I4891 Unspecified atrial fibrillation: Secondary | ICD-10-CM | POA: Diagnosis not present

## 2012-05-06 DIAGNOSIS — E1165 Type 2 diabetes mellitus with hyperglycemia: Secondary | ICD-10-CM | POA: Diagnosis not present

## 2012-05-06 DIAGNOSIS — H524 Presbyopia: Secondary | ICD-10-CM | POA: Diagnosis not present

## 2012-05-06 DIAGNOSIS — Z7901 Long term (current) use of anticoagulants: Secondary | ICD-10-CM

## 2012-05-06 LAB — POCT INR: INR: 5.5

## 2012-05-13 ENCOUNTER — Ambulatory Visit (INDEPENDENT_AMBULATORY_CARE_PROVIDER_SITE_OTHER): Payer: Medicare Other | Admitting: *Deleted

## 2012-05-13 DIAGNOSIS — I4891 Unspecified atrial fibrillation: Secondary | ICD-10-CM

## 2012-05-13 DIAGNOSIS — Z7901 Long term (current) use of anticoagulants: Secondary | ICD-10-CM

## 2012-05-13 LAB — POCT INR: INR: 1.6

## 2012-05-20 ENCOUNTER — Ambulatory Visit (INDEPENDENT_AMBULATORY_CARE_PROVIDER_SITE_OTHER): Payer: Medicare Other | Admitting: Pharmacist

## 2012-05-20 DIAGNOSIS — Z7901 Long term (current) use of anticoagulants: Secondary | ICD-10-CM | POA: Diagnosis not present

## 2012-05-20 DIAGNOSIS — I4891 Unspecified atrial fibrillation: Secondary | ICD-10-CM | POA: Diagnosis not present

## 2012-05-27 ENCOUNTER — Ambulatory Visit (INDEPENDENT_AMBULATORY_CARE_PROVIDER_SITE_OTHER): Payer: Medicare Other | Admitting: *Deleted

## 2012-05-27 DIAGNOSIS — Z7901 Long term (current) use of anticoagulants: Secondary | ICD-10-CM | POA: Diagnosis not present

## 2012-05-27 DIAGNOSIS — I4891 Unspecified atrial fibrillation: Secondary | ICD-10-CM

## 2012-06-01 ENCOUNTER — Encounter: Payer: Self-pay | Admitting: Internal Medicine

## 2012-06-01 DIAGNOSIS — E559 Vitamin D deficiency, unspecified: Secondary | ICD-10-CM | POA: Diagnosis not present

## 2012-06-01 DIAGNOSIS — I10 Essential (primary) hypertension: Secondary | ICD-10-CM | POA: Diagnosis not present

## 2012-06-01 DIAGNOSIS — R5381 Other malaise: Secondary | ICD-10-CM | POA: Diagnosis not present

## 2012-06-01 DIAGNOSIS — E538 Deficiency of other specified B group vitamins: Secondary | ICD-10-CM | POA: Diagnosis not present

## 2012-06-01 DIAGNOSIS — E119 Type 2 diabetes mellitus without complications: Secondary | ICD-10-CM | POA: Diagnosis not present

## 2012-06-01 DIAGNOSIS — D649 Anemia, unspecified: Secondary | ICD-10-CM | POA: Diagnosis not present

## 2012-06-03 ENCOUNTER — Telehealth: Payer: Self-pay | Admitting: Cardiovascular Disease

## 2012-06-03 ENCOUNTER — Ambulatory Visit (INDEPENDENT_AMBULATORY_CARE_PROVIDER_SITE_OTHER): Payer: Medicare Other | Admitting: *Deleted

## 2012-06-03 DIAGNOSIS — Z7901 Long term (current) use of anticoagulants: Secondary | ICD-10-CM

## 2012-06-03 DIAGNOSIS — I4891 Unspecified atrial fibrillation: Secondary | ICD-10-CM

## 2012-06-03 LAB — POCT INR: INR: 2.3

## 2012-06-03 MED ORDER — TELMISARTAN 40 MG PO TABS: 40.0000 mg | ORAL_TABLET | Freq: Every day | ORAL | Status: DC

## 2012-06-03 NOTE — Telephone Encounter (Signed)
New Problem:    Patient called in needing a refill of her telmisartan (MICARDIS) 40 MG tablet.

## 2012-06-17 ENCOUNTER — Ambulatory Visit (INDEPENDENT_AMBULATORY_CARE_PROVIDER_SITE_OTHER): Payer: Medicare Other | Admitting: Pharmacist

## 2012-06-17 DIAGNOSIS — Z7901 Long term (current) use of anticoagulants: Secondary | ICD-10-CM | POA: Diagnosis not present

## 2012-06-17 DIAGNOSIS — I4891 Unspecified atrial fibrillation: Secondary | ICD-10-CM | POA: Diagnosis not present

## 2012-06-17 LAB — POCT INR: INR: 2.3

## 2012-06-30 DIAGNOSIS — E538 Deficiency of other specified B group vitamins: Secondary | ICD-10-CM | POA: Diagnosis not present

## 2012-07-01 DIAGNOSIS — Z1212 Encounter for screening for malignant neoplasm of rectum: Secondary | ICD-10-CM | POA: Diagnosis not present

## 2012-07-03 ENCOUNTER — Telehealth: Payer: Self-pay | Admitting: Internal Medicine

## 2012-07-03 MED ORDER — LOSARTAN POTASSIUM 100 MG PO TABS: 100.0000 mg | ORAL_TABLET | Freq: Every day | ORAL | Status: DC

## 2012-07-03 NOTE — Telephone Encounter (Signed)
She has not been taking the Micardis for over a year  She is a very poor historian.  I have told her to continue with the Losartan and to follow up with her PCP for her HTN

## 2012-07-03 NOTE — Telephone Encounter (Signed)
Stay on Losartan not Micardis  Lmom for patient

## 2012-07-03 NOTE — Telephone Encounter (Signed)
Pt calling re pacemaker placement, and was given rx for mycardis , insurance will not cover but will cover losartin and said it was similar, she is already on it and wants to know why she was given both and what she is to be taking?

## 2012-07-03 NOTE — Telephone Encounter (Signed)
F/u   Patient called back and still has questions, she would like a return call [phone].

## 2012-07-08 ENCOUNTER — Ambulatory Visit (INDEPENDENT_AMBULATORY_CARE_PROVIDER_SITE_OTHER): Payer: Medicare Other | Admitting: Pharmacist

## 2012-07-08 DIAGNOSIS — Z7901 Long term (current) use of anticoagulants: Secondary | ICD-10-CM

## 2012-07-08 DIAGNOSIS — I4891 Unspecified atrial fibrillation: Secondary | ICD-10-CM

## 2012-07-08 LAB — POCT INR: INR: 2.5

## 2012-07-14 DIAGNOSIS — R350 Frequency of micturition: Secondary | ICD-10-CM | POA: Diagnosis not present

## 2012-07-14 DIAGNOSIS — I10 Essential (primary) hypertension: Secondary | ICD-10-CM | POA: Diagnosis not present

## 2012-07-15 DIAGNOSIS — H35039 Hypertensive retinopathy, unspecified eye: Secondary | ICD-10-CM | POA: Diagnosis not present

## 2012-07-15 DIAGNOSIS — H353190 Nonexudative age-related macular degeneration, unspecified eye, stage unspecified: Secondary | ICD-10-CM | POA: Diagnosis not present

## 2012-07-15 DIAGNOSIS — E11359 Type 2 diabetes mellitus with proliferative diabetic retinopathy without macular edema: Secondary | ICD-10-CM | POA: Diagnosis not present

## 2012-07-15 DIAGNOSIS — E1139 Type 2 diabetes mellitus with other diabetic ophthalmic complication: Secondary | ICD-10-CM | POA: Diagnosis not present

## 2012-07-31 ENCOUNTER — Ambulatory Visit (INDEPENDENT_AMBULATORY_CARE_PROVIDER_SITE_OTHER): Payer: Medicare Other | Admitting: Internal Medicine

## 2012-07-31 ENCOUNTER — Encounter: Payer: Self-pay | Admitting: Internal Medicine

## 2012-07-31 ENCOUNTER — Ambulatory Visit (INDEPENDENT_AMBULATORY_CARE_PROVIDER_SITE_OTHER): Payer: Medicare Other

## 2012-07-31 VITALS — BP 150/62 | HR 72 | Ht 62.0 in | Wt 152.0 lb

## 2012-07-31 DIAGNOSIS — I495 Sick sinus syndrome: Secondary | ICD-10-CM

## 2012-07-31 DIAGNOSIS — I480 Paroxysmal atrial fibrillation: Secondary | ICD-10-CM

## 2012-07-31 DIAGNOSIS — I4891 Unspecified atrial fibrillation: Secondary | ICD-10-CM

## 2012-07-31 DIAGNOSIS — Z7901 Long term (current) use of anticoagulants: Secondary | ICD-10-CM

## 2012-07-31 LAB — PACEMAKER DEVICE OBSERVATION
AL THRESHOLD: 0.5 V
BAMS-0001: 150 {beats}/min
BATTERY VOLTAGE: 2.79 V
RV LEAD AMPLITUDE: 31.36 mv
RV LEAD THRESHOLD: 0.875 V

## 2012-07-31 LAB — POCT INR: INR: 2.7

## 2012-07-31 MED ORDER — AMLODIPINE BESYLATE 10 MG PO TABS: 10.0000 mg | ORAL_TABLET | Freq: Every day | ORAL | Status: DC

## 2012-07-31 NOTE — Progress Notes (Signed)
PCP: Unk Pinto Primary Cardiologist:  Dr Valerie Salts Kylie Sanchez is a 78 y.o. female who presents today for routine electrophysiology followup.  Since having her pacemaker implanted, the patient reports doing very well.  She has had no further syncope.  Today, she denies symptoms of palpitations, chest pain, shortness of breath,  lower extremity edema, dizziness, presyncope, or syncope.  The patient is otherwise without complaint today.   Past Medical History  Diagnosis Date  . Dizziness   . HTN (hypertension)   . Diabetes mellitus   . Hyperlipidemia   . Cataract   . Paroxysmal atrial fibrillation   . Sick sinus syndrome    Past Surgical History  Procedure Date  . Pacemaker insertion 04/20/12    MDT Adapta L implanted by Dr Rayann Heman    Current Outpatient Prescriptions  Medication Sig Dispense Refill  . acarbose (PRECOSE) 100 MG tablet Take 100 mg by mouth daily.        Marland Kitchen amLODipine (NORVASC) 5 MG tablet Take 1 tablet (5 mg total) by mouth daily.  30 tablet  6  . b complex vitamins tablet Take 1 tablet by mouth daily.      . cholecalciferol (VITAMIN D) 1000 UNITS tablet Take 1,000 Units by mouth daily.      . Coenzyme Q10 (CO Q-10) 200 MG CAPS Take by mouth daily.      Marland Kitchen glimepiride (AMARYL) 1 MG tablet Take 1 mg by mouth daily before breakfast.        . losartan (COZAAR) 100 MG tablet Take 1 tablet (100 mg total) by mouth daily.  30 tablet  6  . Magnesium 250 MG TABS Take by mouth daily.      . metFORMIN (GLUCOPHAGE) 500 MG tablet Take 1,000 mg by mouth 2 (two) times daily with a meal.      . metoprolol succinate (TOPROL-XL) 100 MG 24 hr tablet Take 1 tablet (100 mg total) by mouth daily. Take with or immediately following a meal.  30 tablet  6  . NON FORMULARY BLADDER CONTROL ADVANTAGE -- 1 tab daily      . pravastatin (PRAVACHOL) 40 MG tablet Take 1 tablet (40 mg total) by mouth daily.  30 tablet  6  . warfarin (COUMADIN) 5 MG tablet Take 1 tablet (5 mg total) by mouth daily at 6  PM.  30 tablet  3    Physical Exam: Filed Vitals:   07/31/12 0919  BP: 150/62  Pulse: 72  Height: 5\' 2"  (1.575 m)  Weight: 152 lb (68.947 kg)  SpO2: 96%    GEN- The patient is well appearing, alert and oriented x 3 today.   Head- normocephalic, atraumatic Eyes-  Sclera clear, conjunctiva pink Ears- hearing intact Oropharynx- clear Lungs- Clear to ausculation bilaterally, normal work of breathing Chest- pacemaker pocket is well healed Heart- Regular rate and rhythm, no murmurs, rubs or gallops, PMI not laterally displaced GI- soft, NT, ND, + BS Extremities- no clubbing, cyanosis, or edema  Pacemaker interrogation- reviewed in detail today,  See PACEART report  Assessment and Plan:  1. Bradycardia Normal pacemaker function See Pace Art report No changes today  2. Afib Controlled Continue coumadin  3. HTN- above goal Increase norvasc to 10mg  daily

## 2012-07-31 NOTE — Patient Instructions (Signed)
Your physician wants you to follow-up in: June with Dr Rayann Heman Dennis Bast will receive a reminder letter in the mail two months in advance. If you don't receive a letter, please call our office to schedule the follow-up appointment.   Your physician has recommended you make the following change in your medication:  1) Increase our Amlodipine to 10mg  daily

## 2012-08-04 DIAGNOSIS — K921 Melena: Secondary | ICD-10-CM | POA: Diagnosis not present

## 2012-08-04 DIAGNOSIS — M76899 Other specified enthesopathies of unspecified lower limb, excluding foot: Secondary | ICD-10-CM | POA: Diagnosis not present

## 2012-08-13 DIAGNOSIS — M25559 Pain in unspecified hip: Secondary | ICD-10-CM | POA: Diagnosis not present

## 2012-08-14 ENCOUNTER — Ambulatory Visit
Admission: RE | Admit: 2012-08-14 | Discharge: 2012-08-14 | Disposition: A | Discharge status: Home / Self Care | Payer: Medicare Other | Source: Ambulatory Visit | Attending: Orthopedic Surgery | Admitting: Orthopedic Surgery

## 2012-08-14 ENCOUNTER — Other Ambulatory Visit: Payer: Self-pay | Admitting: Orthopedic Surgery

## 2012-08-14 DIAGNOSIS — R52 Pain, unspecified: Secondary | ICD-10-CM

## 2012-08-14 DIAGNOSIS — M533 Sacrococcygeal disorders, not elsewhere classified: Secondary | ICD-10-CM | POA: Diagnosis not present

## 2012-08-19 DIAGNOSIS — M545 Low back pain: Secondary | ICD-10-CM | POA: Diagnosis not present

## 2012-08-26 ENCOUNTER — Ambulatory Visit (INDEPENDENT_AMBULATORY_CARE_PROVIDER_SITE_OTHER): Payer: Medicare Other | Admitting: *Deleted

## 2012-08-26 DIAGNOSIS — Z7901 Long term (current) use of anticoagulants: Secondary | ICD-10-CM | POA: Diagnosis not present

## 2012-08-26 DIAGNOSIS — I4891 Unspecified atrial fibrillation: Secondary | ICD-10-CM

## 2012-09-09 ENCOUNTER — Ambulatory Visit (INDEPENDENT_AMBULATORY_CARE_PROVIDER_SITE_OTHER): Payer: Medicare Other | Admitting: *Deleted

## 2012-09-09 DIAGNOSIS — I4891 Unspecified atrial fibrillation: Secondary | ICD-10-CM | POA: Diagnosis not present

## 2012-09-09 DIAGNOSIS — Z7901 Long term (current) use of anticoagulants: Secondary | ICD-10-CM | POA: Diagnosis not present

## 2012-09-09 LAB — POCT INR: INR: 2.4

## 2012-10-07 ENCOUNTER — Ambulatory Visit (INDEPENDENT_AMBULATORY_CARE_PROVIDER_SITE_OTHER): Payer: Medicare Other | Admitting: *Deleted

## 2012-10-07 DIAGNOSIS — I4891 Unspecified atrial fibrillation: Secondary | ICD-10-CM | POA: Diagnosis not present

## 2012-10-07 DIAGNOSIS — Z7901 Long term (current) use of anticoagulants: Secondary | ICD-10-CM | POA: Diagnosis not present

## 2012-10-14 ENCOUNTER — Other Ambulatory Visit (HOSPITAL_COMMUNITY): Payer: Self-pay | Admitting: Cardiology

## 2012-10-28 ENCOUNTER — Ambulatory Visit (INDEPENDENT_AMBULATORY_CARE_PROVIDER_SITE_OTHER): Payer: Medicare Other | Admitting: Pharmacist

## 2012-10-28 DIAGNOSIS — Z7901 Long term (current) use of anticoagulants: Secondary | ICD-10-CM

## 2012-10-28 DIAGNOSIS — I4891 Unspecified atrial fibrillation: Secondary | ICD-10-CM

## 2012-11-17 ENCOUNTER — Telehealth: Payer: Self-pay | Admitting: Internal Medicine

## 2012-11-17 NOTE — Telephone Encounter (Signed)
New problem:    Patient was having frequently cough. Friend had advise that prolonged coughing would cause the pacemaker to be out of rhythm an cause a cva.  Coughing has improve.  Please advise

## 2012-11-17 NOTE — Telephone Encounter (Signed)
Spoke with [place] PA, coughing does not cause afib nor will that damage the pacemaker, pt informed.

## 2012-12-03 ENCOUNTER — Ambulatory Visit (INDEPENDENT_AMBULATORY_CARE_PROVIDER_SITE_OTHER): Payer: Medicare Other

## 2012-12-03 DIAGNOSIS — I4891 Unspecified atrial fibrillation: Secondary | ICD-10-CM | POA: Diagnosis not present

## 2012-12-03 DIAGNOSIS — Z7901 Long term (current) use of anticoagulants: Secondary | ICD-10-CM | POA: Diagnosis not present

## 2012-12-22 DIAGNOSIS — J449 Chronic obstructive pulmonary disease, unspecified: Secondary | ICD-10-CM | POA: Diagnosis not present

## 2013-02-02 DIAGNOSIS — E782 Mixed hyperlipidemia: Secondary | ICD-10-CM | POA: Diagnosis not present

## 2013-02-02 DIAGNOSIS — Z79899 Other long term (current) drug therapy: Secondary | ICD-10-CM | POA: Diagnosis not present

## 2013-02-02 DIAGNOSIS — I10 Essential (primary) hypertension: Secondary | ICD-10-CM | POA: Diagnosis not present

## 2013-02-02 DIAGNOSIS — Z7901 Long term (current) use of anticoagulants: Secondary | ICD-10-CM | POA: Diagnosis not present

## 2013-02-02 DIAGNOSIS — E559 Vitamin D deficiency, unspecified: Secondary | ICD-10-CM | POA: Diagnosis not present

## 2013-02-02 DIAGNOSIS — Z1212 Encounter for screening for malignant neoplasm of rectum: Secondary | ICD-10-CM | POA: Diagnosis not present

## 2013-02-02 DIAGNOSIS — E538 Deficiency of other specified B group vitamins: Secondary | ICD-10-CM | POA: Diagnosis not present

## 2013-02-02 DIAGNOSIS — E119 Type 2 diabetes mellitus without complications: Secondary | ICD-10-CM | POA: Diagnosis not present

## 2013-02-03 ENCOUNTER — Ambulatory Visit (INDEPENDENT_AMBULATORY_CARE_PROVIDER_SITE_OTHER): Payer: Medicare Other | Admitting: Pharmacist

## 2013-02-03 DIAGNOSIS — I4891 Unspecified atrial fibrillation: Secondary | ICD-10-CM

## 2013-02-03 DIAGNOSIS — Z7901 Long term (current) use of anticoagulants: Secondary | ICD-10-CM | POA: Diagnosis not present

## 2013-02-03 LAB — POCT INR: INR: 5.5

## 2013-02-15 ENCOUNTER — Ambulatory Visit (INDEPENDENT_AMBULATORY_CARE_PROVIDER_SITE_OTHER): Payer: Medicare Other | Admitting: Pharmacist

## 2013-02-15 DIAGNOSIS — I4891 Unspecified atrial fibrillation: Secondary | ICD-10-CM | POA: Diagnosis not present

## 2013-02-15 DIAGNOSIS — Z7901 Long term (current) use of anticoagulants: Secondary | ICD-10-CM | POA: Diagnosis not present

## 2013-02-15 LAB — POCT INR: INR: 2.2

## 2013-03-17 ENCOUNTER — Ambulatory Visit (INDEPENDENT_AMBULATORY_CARE_PROVIDER_SITE_OTHER): Payer: Medicare Other | Admitting: *Deleted

## 2013-03-17 DIAGNOSIS — I4891 Unspecified atrial fibrillation: Secondary | ICD-10-CM | POA: Diagnosis not present

## 2013-03-17 DIAGNOSIS — Z7901 Long term (current) use of anticoagulants: Secondary | ICD-10-CM | POA: Diagnosis not present

## 2013-03-24 DIAGNOSIS — E1139 Type 2 diabetes mellitus with other diabetic ophthalmic complication: Secondary | ICD-10-CM | POA: Diagnosis not present

## 2013-03-24 DIAGNOSIS — H3582 Retinal ischemia: Secondary | ICD-10-CM | POA: Diagnosis not present

## 2013-03-24 DIAGNOSIS — H353190 Nonexudative age-related macular degeneration, unspecified eye, stage unspecified: Secondary | ICD-10-CM | POA: Diagnosis not present

## 2013-03-24 DIAGNOSIS — H35059 Retinal neovascularization, unspecified, unspecified eye: Secondary | ICD-10-CM | POA: Diagnosis not present

## 2013-04-14 DIAGNOSIS — H353290 Exudative age-related macular degeneration, unspecified eye, stage unspecified: Secondary | ICD-10-CM | POA: Diagnosis not present

## 2013-04-19 ENCOUNTER — Other Ambulatory Visit: Payer: Self-pay | Admitting: Internal Medicine

## 2013-04-19 NOTE — Telephone Encounter (Signed)
..   Requested Prescriptions   Pending Prescriptions Disp Refills  . losartan (COZAAR) 100 MG tablet [Pharmacy Med Name: LOSARTAN 100MG    TAB] 30 tablet 6    Sig: TAKE ONE TABLET BY MOUTH EVERY DAY  .Patient needs to contact office to schedule  Appointment  for future refills.Ph:[phone]. Thank you.

## 2013-04-21 ENCOUNTER — Ambulatory Visit (INDEPENDENT_AMBULATORY_CARE_PROVIDER_SITE_OTHER): Payer: Medicare Other | Admitting: *Deleted

## 2013-04-21 DIAGNOSIS — Z7901 Long term (current) use of anticoagulants: Secondary | ICD-10-CM | POA: Diagnosis not present

## 2013-04-21 DIAGNOSIS — I4891 Unspecified atrial fibrillation: Secondary | ICD-10-CM | POA: Diagnosis not present

## 2013-04-21 LAB — POCT INR: INR: 2.3

## 2013-04-27 ENCOUNTER — Other Ambulatory Visit (HOSPITAL_COMMUNITY): Payer: Self-pay | Admitting: Internal Medicine

## 2013-05-12 ENCOUNTER — Encounter: Payer: Self-pay | Admitting: Internal Medicine

## 2013-05-12 ENCOUNTER — Ambulatory Visit (INDEPENDENT_AMBULATORY_CARE_PROVIDER_SITE_OTHER): Payer: Medicare Other | Admitting: Internal Medicine

## 2013-05-12 ENCOUNTER — Other Ambulatory Visit: Payer: Self-pay | Admitting: Internal Medicine

## 2013-05-12 VITALS — BP 155/67 | HR 70 | Ht 62.0 in | Wt 154.0 lb

## 2013-05-12 DIAGNOSIS — Z7901 Long term (current) use of anticoagulants: Secondary | ICD-10-CM

## 2013-05-12 DIAGNOSIS — I10 Essential (primary) hypertension: Secondary | ICD-10-CM

## 2013-05-12 DIAGNOSIS — I4891 Unspecified atrial fibrillation: Secondary | ICD-10-CM

## 2013-05-12 DIAGNOSIS — I495 Sick sinus syndrome: Secondary | ICD-10-CM | POA: Diagnosis not present

## 2013-05-12 NOTE — Progress Notes (Signed)
  Kylie Sanchez is a 79 y.o. female who presents today for routine electrophysiology followup.  Since last being seen in our clinic, the patient reports doing very well.  Today, she denies symptoms of palpitations, chest pain, shortness of breath, dizziness, presyncope, or syncope.  The patient is otherwise without complaint today.   Past Medical History  Diagnosis Date  . Dizziness   . HTN (hypertension)   . Diabetes mellitus   . Hyperlipidemia   . Cataract   . Paroxysmal atrial fibrillation   . Sick sinus syndrome    Past Surgical History  Procedure Laterality Date  . Pacemaker insertion  04/20/12    MDT Adapta L implanted by Dr Rayann Heman    Current Outpatient Prescriptions  Medication Sig Dispense Refill  . amLODipine (NORVASC) 10 MG tablet Take 5 mg by mouth daily. TAKE 1/2 TAB BY MOUTH DAILY      . b complex vitamins tablet Take 1 tablet by mouth daily.      . cholecalciferol (VITAMIN D) 1000 UNITS tablet Take 1,000 Units by mouth daily.      . Coenzyme Q10 (CO Q-10) 200 MG CAPS Take by mouth daily.      Marland Kitchen glimepiride (AMARYL) 1 MG tablet Take 1 mg by mouth daily before breakfast.        . losartan (COZAAR) 100 MG tablet TAKE ONE TABLET BY MOUTH EVERY DAY  30 tablet  6  . Magnesium 250 MG TABS Take by mouth daily.      . metFORMIN (GLUCOPHAGE) 500 MG tablet Take 1,000 mg by mouth 2 (two) times daily with a meal.      . pravastatin (PRAVACHOL) 40 MG tablet Take 1 tablet (40 mg total) by mouth daily.  30 tablet  6  . warfarin (COUMADIN) 5 MG tablet TAKE AS DIRECTED BY  ANTICOAGULATION  CLINIC  30 tablet  3  . metoprolol succinate (TOPROL-XL) 100 MG 24 hr tablet Take 1 tablet (100 mg total) by mouth daily. Take with or immediately following a meal.  30 tablet  6   No current facility-administered medications for this visit.    Physical Exam: Filed Vitals:   05/12/13 1039  BP: 155/67  Pulse: 70  Height: 5\' 2"  (1.575 m)  Weight: 154 lb (69.854 kg)    GEN- The patient is well  appearing, alert and oriented x 3 today.   Head- normocephalic, atraumatic Eyes-  Sclera clear, conjunctiva pink Ears- hearing intact Oropharynx- clear Lungs- Clear to ausculation bilaterally, normal work of breathing Chest- pacemaker pocket is well healed Heart- regular rate and rhythm, no murmurs, rubs or gallops, PMI not laterally displaced GI- soft, NT, ND, + BS Extremities- no clubbing, cyanosis, or edema  Pacemaker interrogation- reviewed in detail today,  See PACEART report  Assessment and Plan:  1. Symptomatic bradycardia Normal pacemaker function See Pace Art report No changes today  2. HTN Slightly elevated She has not taken her medicine today  3. afib coumaind  Follow-up with the device clinic PA in 6 months I will see again in 1 year

## 2013-05-12 NOTE — Patient Instructions (Addendum)
Remote monitoring is used to monitor your Pacemaker from home. This monitoring reduces the number of office visits required to check your device to one time per year. It allows [place] to keep an eye on the functioning of your device to ensure it is working properly. You are scheduled for a device check from home on August 16, 2013. You may send your transmission at any time that day. If you have a wireless device, the transmission will be sent automatically. After your physician reviews your transmission, you will receive a postcard with your next transmission date.  Your physician wants you to follow-up in: 6 months with Ileene Hutchinson, PA.  You will receive a reminder letter in the mail two months in advance. If you don't receive a letter, please call our office to schedule the follow-up appointment.

## 2013-05-21 DIAGNOSIS — H353290 Exudative age-related macular degeneration, unspecified eye, stage unspecified: Secondary | ICD-10-CM | POA: Diagnosis not present

## 2013-05-21 DIAGNOSIS — E11359 Type 2 diabetes mellitus with proliferative diabetic retinopathy without macular edema: Secondary | ICD-10-CM | POA: Diagnosis not present

## 2013-05-21 DIAGNOSIS — H35059 Retinal neovascularization, unspecified, unspecified eye: Secondary | ICD-10-CM | POA: Diagnosis not present

## 2013-05-21 DIAGNOSIS — E1139 Type 2 diabetes mellitus with other diabetic ophthalmic complication: Secondary | ICD-10-CM | POA: Diagnosis not present

## 2013-05-22 LAB — PACEMAKER DEVICE OBSERVATION
AL AMPLITUDE: 2.8 mv
BAMS-0001: 150 {beats}/min
BATTERY VOLTAGE: 2.79 V
RV LEAD AMPLITUDE: 16 mv
RV LEAD IMPEDENCE PM: 873 Ohm
RV LEAD THRESHOLD: 0.875 V

## 2013-05-26 DIAGNOSIS — E1165 Type 2 diabetes mellitus with hyperglycemia: Secondary | ICD-10-CM | POA: Diagnosis not present

## 2013-05-26 DIAGNOSIS — E11359 Type 2 diabetes mellitus with proliferative diabetic retinopathy without macular edema: Secondary | ICD-10-CM | POA: Diagnosis not present

## 2013-05-26 DIAGNOSIS — H353190 Nonexudative age-related macular degeneration, unspecified eye, stage unspecified: Secondary | ICD-10-CM | POA: Diagnosis not present

## 2013-05-28 ENCOUNTER — Encounter: Payer: Self-pay | Admitting: Internal Medicine

## 2013-05-28 DIAGNOSIS — E538 Deficiency of other specified B group vitamins: Secondary | ICD-10-CM | POA: Diagnosis not present

## 2013-05-28 DIAGNOSIS — E559 Vitamin D deficiency, unspecified: Secondary | ICD-10-CM | POA: Diagnosis not present

## 2013-05-28 DIAGNOSIS — E782 Mixed hyperlipidemia: Secondary | ICD-10-CM | POA: Diagnosis not present

## 2013-05-28 DIAGNOSIS — Z79899 Other long term (current) drug therapy: Secondary | ICD-10-CM | POA: Diagnosis not present

## 2013-05-28 DIAGNOSIS — I10 Essential (primary) hypertension: Secondary | ICD-10-CM | POA: Diagnosis not present

## 2013-05-28 DIAGNOSIS — E119 Type 2 diabetes mellitus without complications: Secondary | ICD-10-CM | POA: Diagnosis not present

## 2013-07-05 ENCOUNTER — Ambulatory Visit (INDEPENDENT_AMBULATORY_CARE_PROVIDER_SITE_OTHER): Payer: Medicare Other | Admitting: *Deleted

## 2013-07-05 DIAGNOSIS — I4891 Unspecified atrial fibrillation: Secondary | ICD-10-CM | POA: Diagnosis not present

## 2013-07-05 DIAGNOSIS — Z7901 Long term (current) use of anticoagulants: Secondary | ICD-10-CM | POA: Diagnosis not present

## 2013-07-20 DIAGNOSIS — E119 Type 2 diabetes mellitus without complications: Secondary | ICD-10-CM | POA: Diagnosis not present

## 2013-07-23 DIAGNOSIS — H353290 Exudative age-related macular degeneration, unspecified eye, stage unspecified: Secondary | ICD-10-CM | POA: Diagnosis not present

## 2013-07-23 DIAGNOSIS — E11349 Type 2 diabetes mellitus with severe nonproliferative diabetic retinopathy without macular edema: Secondary | ICD-10-CM | POA: Diagnosis not present

## 2013-07-23 DIAGNOSIS — E11359 Type 2 diabetes mellitus with proliferative diabetic retinopathy without macular edema: Secondary | ICD-10-CM | POA: Diagnosis not present

## 2013-07-23 DIAGNOSIS — H35059 Retinal neovascularization, unspecified, unspecified eye: Secondary | ICD-10-CM | POA: Diagnosis not present

## 2013-08-09 DIAGNOSIS — E559 Vitamin D deficiency, unspecified: Secondary | ICD-10-CM | POA: Diagnosis not present

## 2013-08-09 DIAGNOSIS — E782 Mixed hyperlipidemia: Secondary | ICD-10-CM | POA: Diagnosis not present

## 2013-08-09 DIAGNOSIS — Z79899 Other long term (current) drug therapy: Secondary | ICD-10-CM | POA: Diagnosis not present

## 2013-08-09 DIAGNOSIS — I10 Essential (primary) hypertension: Secondary | ICD-10-CM | POA: Diagnosis not present

## 2013-08-09 DIAGNOSIS — E119 Type 2 diabetes mellitus without complications: Secondary | ICD-10-CM | POA: Diagnosis not present

## 2013-08-10 ENCOUNTER — Other Ambulatory Visit: Payer: Self-pay | Admitting: *Deleted

## 2013-08-10 ENCOUNTER — Other Ambulatory Visit: Payer: Self-pay | Admitting: Internal Medicine

## 2013-08-10 MED ORDER — AMLODIPINE BESYLATE 10 MG PO TABS: 5.0000 mg | ORAL_TABLET | Freq: Every day | ORAL | Status: DC

## 2013-08-16 ENCOUNTER — Encounter: Payer: Medicare Other | Admitting: *Deleted

## 2013-08-23 ENCOUNTER — Encounter: Payer: Self-pay | Admitting: *Deleted

## 2013-10-02 ENCOUNTER — Other Ambulatory Visit: Payer: Self-pay | Admitting: Internal Medicine

## 2013-10-27 ENCOUNTER — Encounter: Payer: Self-pay | Admitting: Internal Medicine

## 2013-10-27 DIAGNOSIS — I495 Sick sinus syndrome: Secondary | ICD-10-CM | POA: Insufficient documentation

## 2013-10-27 DIAGNOSIS — H3500 Unspecified background retinopathy: Secondary | ICD-10-CM | POA: Insufficient documentation

## 2013-10-27 DIAGNOSIS — E785 Hyperlipidemia, unspecified: Secondary | ICD-10-CM | POA: Insufficient documentation

## 2013-10-28 ENCOUNTER — Ambulatory Visit: Payer: Self-pay | Admitting: Emergency Medicine

## 2013-11-11 DIAGNOSIS — C801 Malignant (primary) neoplasm, unspecified: Secondary | ICD-10-CM

## 2013-11-11 HISTORY — DX: Malignant (primary) neoplasm, unspecified: C80.1

## 2013-11-15 ENCOUNTER — Other Ambulatory Visit: Payer: Self-pay | Admitting: Cardiovascular Disease

## 2013-11-22 ENCOUNTER — Other Ambulatory Visit (HOSPITAL_COMMUNITY): Payer: Self-pay | Admitting: Internal Medicine

## 2013-11-23 DIAGNOSIS — E1139 Type 2 diabetes mellitus with other diabetic ophthalmic complication: Secondary | ICD-10-CM | POA: Diagnosis not present

## 2013-11-23 DIAGNOSIS — H353290 Exudative age-related macular degeneration, unspecified eye, stage unspecified: Secondary | ICD-10-CM | POA: Diagnosis not present

## 2013-11-23 DIAGNOSIS — E11359 Type 2 diabetes mellitus with proliferative diabetic retinopathy without macular edema: Secondary | ICD-10-CM | POA: Diagnosis not present

## 2013-11-24 ENCOUNTER — Telehealth: Payer: Self-pay | Admitting: *Deleted

## 2013-11-24 MED ORDER — WARFARIN SODIUM 5 MG PO TABS: ORAL_TABLET | ORAL | Status: DC

## 2013-11-24 NOTE — Telephone Encounter (Signed)
Pt called and make appt for today but then called to reschedule due to lack of transportation.  Will send in enough Coumadin to make it until Monday's appt.

## 2013-11-24 NOTE — Telephone Encounter (Signed)
Patient is out of coumadin and would like a refill to be sent to [facility] on [address]. Thanks, MI

## 2013-11-29 ENCOUNTER — Ambulatory Visit (INDEPENDENT_AMBULATORY_CARE_PROVIDER_SITE_OTHER): Payer: Medicare Other | Admitting: Pharmacist

## 2013-11-29 DIAGNOSIS — I4891 Unspecified atrial fibrillation: Secondary | ICD-10-CM

## 2013-11-29 DIAGNOSIS — Z7901 Long term (current) use of anticoagulants: Secondary | ICD-10-CM | POA: Diagnosis not present

## 2013-11-29 LAB — POCT INR: INR: 1.8

## 2013-12-02 DIAGNOSIS — Z961 Presence of intraocular lens: Secondary | ICD-10-CM | POA: Diagnosis not present

## 2013-12-02 DIAGNOSIS — H353190 Nonexudative age-related macular degeneration, unspecified eye, stage unspecified: Secondary | ICD-10-CM | POA: Diagnosis not present

## 2013-12-02 DIAGNOSIS — H02139 Senile ectropion of unspecified eye, unspecified eyelid: Secondary | ICD-10-CM | POA: Diagnosis not present

## 2013-12-02 DIAGNOSIS — E119 Type 2 diabetes mellitus without complications: Secondary | ICD-10-CM | POA: Diagnosis not present

## 2014-01-03 ENCOUNTER — Ambulatory Visit (INDEPENDENT_AMBULATORY_CARE_PROVIDER_SITE_OTHER): Payer: Medicare Other | Admitting: *Deleted

## 2014-01-03 DIAGNOSIS — I4891 Unspecified atrial fibrillation: Secondary | ICD-10-CM

## 2014-01-03 DIAGNOSIS — Z5181 Encounter for therapeutic drug level monitoring: Secondary | ICD-10-CM

## 2014-01-03 DIAGNOSIS — Z7901 Long term (current) use of anticoagulants: Secondary | ICD-10-CM | POA: Diagnosis not present

## 2014-01-03 LAB — POCT INR: INR: 1.8

## 2014-01-09 ENCOUNTER — Other Ambulatory Visit: Payer: Self-pay | Admitting: Internal Medicine

## 2014-01-18 ENCOUNTER — Ambulatory Visit (INDEPENDENT_AMBULATORY_CARE_PROVIDER_SITE_OTHER): Payer: Medicare Other | Admitting: Pharmacist

## 2014-01-18 DIAGNOSIS — I4891 Unspecified atrial fibrillation: Secondary | ICD-10-CM

## 2014-01-18 DIAGNOSIS — Z7901 Long term (current) use of anticoagulants: Secondary | ICD-10-CM

## 2014-01-18 DIAGNOSIS — Z5181 Encounter for therapeutic drug level monitoring: Secondary | ICD-10-CM

## 2014-01-18 LAB — POCT INR: INR: 1.5

## 2014-02-03 ENCOUNTER — Other Ambulatory Visit: Payer: Self-pay | Admitting: Internal Medicine

## 2014-02-08 ENCOUNTER — Ambulatory Visit (INDEPENDENT_AMBULATORY_CARE_PROVIDER_SITE_OTHER): Payer: Medicare Other

## 2014-02-08 DIAGNOSIS — Z7901 Long term (current) use of anticoagulants: Secondary | ICD-10-CM

## 2014-02-08 DIAGNOSIS — I4891 Unspecified atrial fibrillation: Secondary | ICD-10-CM

## 2014-02-08 DIAGNOSIS — Z5181 Encounter for therapeutic drug level monitoring: Secondary | ICD-10-CM | POA: Diagnosis not present

## 2014-02-08 LAB — POCT INR: INR: 2

## 2014-02-09 ENCOUNTER — Encounter: Payer: Self-pay | Admitting: Physician Assistant

## 2014-02-09 ENCOUNTER — Ambulatory Visit (HOSPITAL_COMMUNITY)
Admission: RE | Admit: 2014-02-09 | Discharge: 2014-02-09 | Disposition: A | Discharge status: Home / Self Care | Payer: Medicare Other | Source: Ambulatory Visit | Attending: Physician Assistant | Admitting: Physician Assistant

## 2014-02-09 ENCOUNTER — Ambulatory Visit (INDEPENDENT_AMBULATORY_CARE_PROVIDER_SITE_OTHER): Payer: Medicare Other | Admitting: Physician Assistant

## 2014-02-09 VITALS — BP 122/70 | HR 72 | Temp 97.9°F | Resp 16 | Ht 63.0 in | Wt 159.0 lb

## 2014-02-09 DIAGNOSIS — F3289 Other specified depressive episodes: Secondary | ICD-10-CM | POA: Diagnosis not present

## 2014-02-09 DIAGNOSIS — Z0000 Encounter for general adult medical examination without abnormal findings: Secondary | ICD-10-CM | POA: Diagnosis not present

## 2014-02-09 DIAGNOSIS — I480 Paroxysmal atrial fibrillation: Secondary | ICD-10-CM

## 2014-02-09 DIAGNOSIS — N3000 Acute cystitis without hematuria: Secondary | ICD-10-CM | POA: Diagnosis not present

## 2014-02-09 DIAGNOSIS — R109 Unspecified abdominal pain: Secondary | ICD-10-CM | POA: Insufficient documentation

## 2014-02-09 DIAGNOSIS — M25559 Pain in unspecified hip: Secondary | ICD-10-CM | POA: Diagnosis not present

## 2014-02-09 DIAGNOSIS — I10 Essential (primary) hypertension: Secondary | ICD-10-CM

## 2014-02-09 DIAGNOSIS — I495 Sick sinus syndrome: Secondary | ICD-10-CM

## 2014-02-09 DIAGNOSIS — E785 Hyperlipidemia, unspecified: Secondary | ICD-10-CM | POA: Diagnosis not present

## 2014-02-09 DIAGNOSIS — R946 Abnormal results of thyroid function studies: Secondary | ICD-10-CM | POA: Diagnosis not present

## 2014-02-09 DIAGNOSIS — E119 Type 2 diabetes mellitus without complications: Secondary | ICD-10-CM

## 2014-02-09 DIAGNOSIS — M25552 Pain in left hip: Secondary | ICD-10-CM

## 2014-02-09 DIAGNOSIS — F329 Major depressive disorder, single episode, unspecified: Secondary | ICD-10-CM | POA: Diagnosis not present

## 2014-02-09 DIAGNOSIS — Z1331 Encounter for screening for depression: Secondary | ICD-10-CM | POA: Diagnosis not present

## 2014-02-09 DIAGNOSIS — I4891 Unspecified atrial fibrillation: Secondary | ICD-10-CM

## 2014-02-09 LAB — LIPID PANEL
Cholesterol: 143 mg/dL (ref 0–200)
HDL: 61 mg/dL (ref >39)
LDL Cholesterol: 54 mg/dL (ref 0–99)
TRIGLYCERIDES: 141 mg/dL (ref <150)
Total CHOL/HDL Ratio: 2.3 Ratio
VLDL: 28 mg/dL (ref 0–40)

## 2014-02-09 LAB — CBC WITH DIFFERENTIAL/PLATELET
BASOS ABS: 0.1 10*3/uL (ref 0.0–0.1)
Basophils Relative: 1 % (ref 0–1)
Eosinophils Absolute: 0.4 10*3/uL (ref 0.0–0.7)
Eosinophils Relative: 6 % — ABNORMAL HIGH (ref 0–5)
HCT: 36.3 % (ref 36.0–46.0)
Hemoglobin: 12 g/dL (ref 12.0–15.0)
LYMPHS PCT: 28 % (ref 12–46)
Lymphs Abs: 1.7 10*3/uL (ref 0.7–4.0)
MCH: 29.4 pg (ref 26.0–34.0)
MCHC: 33.1 g/dL (ref 30.0–36.0)
MCV: 89 fL (ref 78.0–100.0)
Monocytes Absolute: 0.5 10*3/uL (ref 0.1–1.0)
Monocytes Relative: 8 % (ref 3–12)
NEUTROS PCT: 57 % (ref 43–77)
Neutro Abs: 3.4 10*3/uL (ref 1.7–7.7)
PLATELETS: 254 10*3/uL (ref 150–400)
RBC: 4.08 MIL/uL (ref 3.87–5.11)
RDW: 13.9 % (ref 11.5–15.5)
WBC: 6 10*3/uL (ref 4.0–10.5)

## 2014-02-09 LAB — HEPATIC FUNCTION PANEL
ALK PHOS: 48 U/L (ref 39–117)
ALT: 19 U/L (ref 0–35)
AST: 19 U/L (ref 0–37)
Albumin: 3.9 g/dL (ref 3.5–5.2)
BILIRUBIN INDIRECT: 0.3 mg/dL (ref 0.2–1.2)
Bilirubin, Direct: 0.1 mg/dL (ref 0.0–0.3)
Total Bilirubin: 0.4 mg/dL (ref 0.2–1.2)
Total Protein: 6.6 g/dL (ref 6.0–8.3)

## 2014-02-09 LAB — BASIC METABOLIC PANEL WITH GFR
BUN: 25 mg/dL — ABNORMAL HIGH (ref 6–23)
CHLORIDE: 108 meq/L (ref 96–112)
CO2: 24 mEq/L (ref 19–32)
Calcium: 9.2 mg/dL (ref 8.4–10.5)
Creat: 1.22 mg/dL — ABNORMAL HIGH (ref 0.50–1.10)
GFR, EST NON AFRICAN AMERICAN: 40 mL/min — AB
GFR, Est African American: 46 mL/min — ABNORMAL LOW
Glucose, Bld: 113 mg/dL — ABNORMAL HIGH (ref 70–99)
POTASSIUM: 5.8 meq/L — AB (ref 3.5–5.3)
Sodium: 140 mEq/L (ref 135–145)

## 2014-02-09 LAB — TSH: TSH: 1.488 u[IU]/mL (ref 0.350–4.500)

## 2014-02-09 LAB — HEMOGLOBIN A1C
Hgb A1c MFr Bld: 6.8 % — ABNORMAL HIGH (ref <5.7)
MEAN PLASMA GLUCOSE: 148 mg/dL — AB (ref <117)

## 2014-02-09 NOTE — Patient Instructions (Signed)
Preventative Care for Adults - Female      MAINTAIN REGULAR HEALTH EXAMS:  A routine yearly physical is a good way to check in with your primary care provider about your health and preventive screening. It is also an opportunity to share updates about your health and any concerns you have, and receive a thorough all-over exam.   Most health insurance companies pay for at least some preventative services.  Check with your health plan for specific coverages.  WHAT PREVENTATIVE SERVICES DO WOMEN NEED?  Adult women should have their weight and blood pressure checked regularly.   Women age 35 and older should have their cholesterol levels checked regularly.  Women should be screened for cervical cancer with a Pap smear and pelvic exam beginning at either age 21, or 3 years after they become sexually activity.    Breast cancer screening generally begins at age 40 with a mammogram and breast exam by your primary care provider.    Beginning at age 50 and continuing to age 75, women should be screened for colorectal cancer.  Certain people may need continued testing until age 85.  Updating vaccinations is part of preventative care.  Vaccinations help protect against diseases such as the flu.  Osteoporosis is a disease in which the bones lose minerals and strength as we age. Women ages 65 and over should discuss this with their caregivers, as should women after menopause who have other risk factors.  Lab tests are generally done as part of preventative care to screen for anemia and blood disorders, to screen for problems with the kidneys and liver, to screen for bladder problems, to check blood sugar, and to check your cholesterol level.  Preventative services generally include counseling about diet, exercise, avoiding tobacco, drugs, excessive alcohol consumption, and sexually transmitted infections.    GENERAL RECOMMENDATIONS FOR GOOD HEALTH:  Healthy diet:  Eat a variety of foods, including  fruit, vegetables, animal or vegetable protein, such as meat, fish, chicken, and eggs, or beans, lentils, tofu, and grains, such as rice.  Drink plenty of water daily.  Decrease saturated fat in the diet, avoid lots of red meat, processed foods, sweets, fast foods, and fried foods.  Exercise:  Aerobic exercise helps maintain good heart health. At least 30-40 minutes of moderate-intensity exercise is recommended. For example, a brisk walk that increases your heart rate and breathing. This should be done on most days of the week.   Find a type of exercise or a variety of exercises that you enjoy so that it becomes a part of your daily life.  Examples are running, walking, swimming, water aerobics, and biking.  For motivation and support, explore group exercise such as aerobic class, spin class, Zumba, Yoga,or  martial arts, etc.    Set exercise goals for yourself, such as a certain weight goal, walk or run in a race such as a 5k walk/run.  Speak to your primary care provider about exercise goals.  Disease prevention:  If you smoke or chew tobacco, find out from your caregiver how to quit. It can literally save your life, no matter how long you have been a tobacco user. If you do not use tobacco, never begin.   Maintain a healthy diet and normal weight. Increased weight leads to problems with blood pressure and diabetes.   The Body Mass Index or BMI is a way of measuring how much of your body is fat. Having a BMI above 27 increases the risk of heart disease,   diabetes, hypertension, stroke and other problems related to obesity. Your caregiver can help determine your BMI and based on it develop an exercise and dietary program to help you achieve or maintain this important measurement at a healthful level.  High blood pressure causes heart and blood vessel problems.  Persistent high blood pressure should be treated with medicine if weight loss and exercise do not work.   Fat and cholesterol leaves  deposits in your arteries that can block them. This causes heart disease and vessel disease elsewhere in your body.  If your cholesterol is found to be high, or if you have heart disease or certain other medical conditions, then you may need to have your cholesterol monitored frequently and be treated with medication.   Ask if you should have a cardiac stress test if your history suggests this. A stress test is a test done on a treadmill that looks for heart disease. This test can find disease prior to there being a problem.  Menopause can be associated with physical symptoms and risks. Hormone replacement therapy is available to decrease these. You should talk to your caregiver about whether starting or continuing to take hormones is right for you.   Osteoporosis is a disease in which the bones lose minerals and strength as we age. This can result in serious bone fractures. Risk of osteoporosis can be identified using a bone density scan. Women ages 65 and over should discuss this with their caregivers, as should women after menopause who have other risk factors. Ask your caregiver whether you should be taking a calcium supplement and Vitamin D, to reduce the rate of osteoporosis.   Avoid drinking alcohol in excess (more than two drinks per day).  Avoid use of street drugs. Do not share needles with anyone. Ask for professional help if you need assistance or instructions on stopping the use of alcohol, cigarettes, and/or drugs.  Brush your teeth twice a day with fluoride toothpaste, and floss once a day. Good oral hygiene prevents tooth decay and gum disease. The problems can be painful, unattractive, and can cause other health problems. Visit your dentist for a routine oral and dental check up and preventive care every 6-12 months.   Look at your skin regularly.  Use a mirror to look at your back. Notify your caregivers of changes in moles, especially if there are changes in shapes, colors, a size  larger than a pencil eraser, an irregular border, or development of new moles.  Safety:  Use seatbelts 100% of the time, whether driving or as a passenger.  Use safety devices such as hearing protection if you work in environments with loud noise or significant background noise.  Use safety glasses when doing any work that could send debris in to the eyes.  Use a helmet if you ride a bike or motorcycle.  Use appropriate safety gear for contact sports.  Talk to your caregiver about gun safety.  Use sunscreen with a SPF (or skin protection factor) of 15 or greater.  Lighter skinned people are at a greater risk of skin cancer. Don't forget to also wear sunglasses in order to protect your eyes from too much damaging sunlight. Damaging sunlight can accelerate cataract formation.   Practice safe sex. Use condoms. Condoms are used for birth control and to help reduce the spread of sexually transmitted infections (or STIs).  Some of the STIs are gonorrhea (the clap), chlamydia, syphilis, trichomonas, herpes, HPV (human papilloma virus) and HIV (human immunodeficiency virus)   which causes AIDS. The herpes, HIV and HPV are viral illnesses that have no cure. These can result in disability, cancer and death.   Keep carbon monoxide and smoke detectors in your home functioning at all times. Change the batteries every 6 months or use a model that plugs into the wall.   Vaccinations:  Stay up to date with your tetanus shots and other required immunizations. You should have a booster for tetanus every 10 years. Be sure to get your flu shot every year, since 5%-20% of the [place] population comes down with the flu. The flu vaccine changes each year, so being vaccinated once is not enough. Get your shot in the fall, before the flu season peaks.   Other vaccines to consider:  Human Papilloma Virus or HPV causes cancer of the cervix, and other infections that can be transmitted from person to person. There is a vaccine for  HPV, and females should get immunized between the ages of 11 and 26. It requires a series of 3 shots.   Pneumococcal vaccine to protect against certain types of pneumonia.  This is normally recommended for adults age 65 or older.  However, adults younger than 78 years old with certain underlying conditions such as diabetes, heart or lung disease should also receive the vaccine.  Shingles vaccine to protect against Varicella Zoster if you are older than age 60, or younger than 78 years old with certain underlying illness.  Hepatitis A vaccine to protect against a form of infection of the liver by a virus acquired from food.  Hepatitis B vaccine to protect against a form of infection of the liver by a virus acquired from blood or body fluids, particularly if you work in health care.  If you plan to travel internationally, check with your local health department for specific vaccination recommendations.  Cancer Screening:  Breast cancer screening is essential to preventive care for women. All women age 20 and older should perform a breast self-exam every month. At age 40 and older, women should have their caregiver complete a breast exam each year. Women at ages 40 and older should have a mammogram (x-ray film) of the breasts. Your caregiver can discuss how often you need mammograms.    Cervical cancer screening includes taking a Pap smear (sample of cells examined under a microscope) from the cervix (end of the uterus). It also includes testing for HPV (Human Papilloma Virus, which can cause cervical cancer). Screening and a pelvic exam should begin at age 21, or 3 years after a woman becomes sexually active. Screening should occur every year, with a Pap smear but no HPV testing, up to age 30. After age 30, you should have a Pap smear every 3 years with HPV testing, if no HPV was found previously.   Most routine colon cancer screening begins at the age of 50. On a yearly basis, doctors may provide  special easy to use take-home tests to check for hidden blood in the stool. Sigmoidoscopy or colonoscopy can detect the earliest forms of colon cancer and is life saving. These tests use a small camera at the end of a tube to directly examine the colon. Speak to your caregiver about this at age 50, when routine screening begins (and is repeated every 5 years unless early forms of pre-cancerous polyps or small growths are found).      Bad carbs also include fruit juice, alcohol, and sweet tea. These are empty calories that do not signal to your   brain that you are full.   Please remember the good carbs are still carbs which convert into sugar. So please measure them out no more than 1/2-1 cup of rice, oatmeal, pasta, and beans.  Veggies are however free foods! Pile them on.   I like lean protein at every meal such as chicken, [place], pork chops, cottage cheese, etc. Just do not fry these meats and please center your meal around vegetable, the meats should be a side dish.   No all fruit is created equal. Please see the list below, the fruit at the bottom is higher in sugars than the fruit at the top   Advance Directive Advance directives are the legal documents that allow you to make choices about your health care and medical treatment if you cannot speak for yourself. Advance directives are a way for you to communicate your wishes to family, friends, and health care providers. The specified people can then convey your decisions about end-of-life care to avoid confusion if you should become unable to communicate. Ideally, the process of discussing and writing advance directives should be discussed over time rather than making decisions all at once. Advance directives can be modified as your situation changes and you can change your mind at any time even after you have signed the advance directives. Each state has its own laws regarding advance directives. You may want to check with your health care  provider, attorney, or state representative about the law in your state. Below are some examples of advance directives. LIVING WILL A living will is a set of instructions documenting your wishes about medical care when you cannot care for yourself. It is used if you become:  Terminally ill.  Incapacitated.  Unable to communicate.  Unable to make decisions. Items to consider in your living will include:  The use or non-use of life-sustaining equipment, such as dialysis machines and breathing machines (ventilators).  A "do not resuscitate" (DNR) order, which is the instruction not to use CPR if breathing or heartbeat stops.  Tube feeding.  Withholding of food and fluids.  Comfort (palliative) care when the goal becomes comfort rather than a cure.  Organ and tissue donation. A living does not give instructions about distribution of your money and property if you should pass away. It is advisable to seek the expert advice of a lawyer in drawing up a will regarding your possessions. Decisions about taxes, beneficiaries, and asset distribution will be legally binding. This process can relieve your family and friends of any burdens surrounding disputes or questions that may come up about the allocation of your assets. DO NOT RESUSCITATE (DNR) A do not resuscitate (DNR) order is a request to not have cardiopulmonary resuscitation (CPR) in the event that your heart stops or you stop breathing. Unless given other instructions, a health care provider will try to help any patient whose heart has stopped or who has stopped breathing.  HEALTHCARE PROXY AND DURABLE POWER OF ATTORNEY FOR HEALTH CARE A health care proxy is a person (agent) appointed to make medical decisions for you if you cannot. Generally, people choose someone they know well and trust to represent their preferences when they can no longer do so. You should be sure to ask this person for agreement to act as your agent. An agent may have  to exercise judgment in the event of a medical decision for which your wishes are not known. The durable power of attorney for health care is the legal document that  names your health care proxy. Once written, it should be:  Signed.  Notarized.  Dated.  Copied.  Witnessed.  Incorporated into your medical record. You may also want to appoint someone to manage your financial affairs if you cannot. This is called a durable power of attorney for finances. It is a separate legal document from the durable power of attorney for health care. You may choose the same person or someone different from your health care proxy to act as your agent in financial matters. Document Released: 02/04/2008 Document Revised: 06/30/2013 Document Reviewed: 03/17/2013 [facility] Patient Information 2014 [place], [place].

## 2014-02-09 NOTE — Progress Notes (Signed)
Subjective:   Kylie Sanchez is a 80 y.o. female who presents for Medicare Annual Wellness Visit and complete physical.    Date of last medicare wellness visit is unknown.  Her blood pressure has been controlled at home, today their BP is BP: 122/70 mmHg She does not workout. She denies chest pain, shortness of breath, dizziness.  She is on cholesterol medication and denies myalgias. Her cholesterol is at goal. The cholesterol last visit was: LDL 49  She has been working on diet and exercise for diabetes, she has not checked her sugars in a while, denies any low blood sugars, and denies nausea, polydipsia and polyuria. Last A1C in the office was: A1C 6.5 Patient is on Vitamin D supplement.   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  Patient states she does not drive anymore due to her vision, she has seen 3 eye doctors in 1 year, she has a tear at her nasal fold with tearing and states she has double vision when she uses both eyes occ, she also sees a retina specialist for DM retinopathy She is currently using a walker, she states for the past 2 weeks she has been having left anterior hip pain.  She states she has a paid for house in [place] and will be moving in may and currently lives on 13th floor of apartment house, uses elevator.   Names of Other Physician/Practitioners you currently use: 1. [facility]- here for primary care 2. Dr. Nicki Reaper, Dr. Gershon Crane, eye doctor, last visit 1 week ago 3. Dr. Johnsie Cancel 4. [facility]- saw yesterday Patient has no care team.   Medication Review Current Outpatient Prescriptions on File Prior to Visit  Medication Sig Dispense Refill  . b complex vitamins tablet Take 1 tablet by mouth daily.      . Coenzyme Q10 (CO Q-10) 200 MG CAPS Take by mouth daily.      Marland Kitchen glimepiride (AMARYL) 1 MG tablet Take 1 mg by mouth daily before breakfast.        .  losartan (COZAAR) 100 MG tablet TAKE ONE TABLET BY MOUTH ONCE DAILY  30 tablet  6  . Magnesium 250 MG TABS Take by mouth daily.      . metFORMIN (GLUCOPHAGE) 500 MG tablet Take 1,000 mg by mouth 2 (two) times daily with a meal.      . ONE TOUCH ULTRA TEST test strip USE ONE STRIP TO TEST BLOOD GLUCOSE TWICE DAILY  100 each  6  . pravastatin (PRAVACHOL) 40 MG tablet TAKE ONE TABLET BY MOUTH EVERY DAY AT BEDTIME FOR CHOLESTEROL  90 tablet  0  . warfarin (COUMADIN) 5 MG tablet TAKE AS DIRECTED BY  ANTICOAGULATION  CLINIC  30 tablet  1  . metoprolol succinate (TOPROL-XL) 100 MG 24 hr tablet Take 1 tablet (100 mg total) by mouth daily. Take with or immediately following a meal.  30 tablet  6   No current facility-administered medications on file prior to visit.    Current Problems (verified) Patient Active Problem List   Diagnosis Date Noted  . Encounter for therapeutic drug monitoring 01/03/2014  . Type II or unspecified type diabetes mellitus without mention of complication, not stated as uncontrolled   . Hyperlipidemia   . Sick sinus syndrome   . Retinopathy   . Encounter for long-term (current) use of anticoagulants 04/30/2012  . Tachycardia-bradycardia 04/19/2012  .  PAF (paroxysmal atrial fibrillation) 04/18/2011  . Abnormal finding on thyroid function test 04/18/2011  . HTN (hypertension) 04/18/2011    Screening Tests Health Maintenance  Topic Date Due  . Tetanus/tdap  06/10/1946  . Colonoscopy  06/10/1977  . Zostavax  06/11/1987  . Pneumococcal Polysaccharide Vaccine Age 71 And Over  06/10/1992  . Influenza Vaccine  06/11/2014     There is no immunization history on file for this patient.  Preventative care: Last colonoscopy: declines Last mammogram: declines Last pap smear/pelvic exam: declines DEXA:declines  Prior vaccinations: TD or Tdap: declines  Influenza: declines  Pneumococcal: declines Shingles/Zostavax: declines  History reviewed: allergies, current  medications, past family history, past medical history, past social history, past surgical history and problem list  Risk Factors: Osteoporosis: postmenopausal estrogen deficiency, dietary calcium and/or vitamin D deficiency and amenorrhea History of fracture in the past year: no  Tobacco History  Substance Use Topics  . Smoking status: Never Smoker   . Smokeless tobacco: Not on file  . Alcohol Use: No   She does not smoke.  Patient is not a former smoker. Are there smokers in your home (other than you)?  No  Alcohol Current alcohol use: none  Caffeine Current caffeine use: denies use  Exercise Current exercise habits: The patient does not participate in regular exercise at present.  Current exercise: none  Nutrition/Diet Current diet: in general, a "healthy" diet    Cardiac risk factors: none.  Depression Screen Nurse depression screen reviewed.  (Note: if answer to either of the following is "Yes", a more complete depression screening is indicated)   Q1: Over the past two weeks, have you felt down, depressed or hopeless? No  Q2: Over the past two weeks, have you felt little interest or pleasure in doing things? No  Have you lost interest or pleasure in daily life? No  Do you often feel hopeless? No  Do you cry easily over simple problems? No  Activities of Daily Living Nurse ADLs screen reviewed.  In your present state of health, do you have any difficulty performing the following activities?:  Driving? Yes Managing money?  No Feeding yourself? No Getting from bed to chair? No Climbing a flight of stairs? Yes Preparing food and eating?: No Bathing or showering? No Getting dressed: No Getting to the toilet? No Using the toilet:No Moving around from place to place: Yes In the past year have you fallen or had a near fall?:No   Are you sexually active?  No  Do you have more than one partner?  No  Vision Difficulties: No  Hearing Difficulties: Yes Do you  often ask people to speak up or repeat themselves? No Do you experience ringing or noises in your ears? No Do you have difficulty understanding soft or whispered voices? Yes  Cognition  Do you feel that you have a problem with memory?Yes  Do you often misplace items? No  Do you feel safe at home?  Yes  Advanced directives Does patient have a [place]? Yes Does patient have a Living Will? No  Objective:     Vision and hearing screens reviewed.   Blood pressure 122/70, pulse 72, temperature 97.9 F (36.6 C), resp. rate 16, height 5\' 3"  (1.6 m), weight 159 lb (72.122 kg). Body mass index is 28.17 kg/(m^2).  General appearance: alert, no distress, WD/WN,  female Cognitive Testing  Alert? Yes  Normal Appearance?Yes  Oriented to person? Yes  Place? Yes   Time? Yes  Recall of three objects?  2/3  Can perform simple calculations? Yes  Displays appropriate judgment?Yes  Can read the correct time from a watch face?Yes  HEENT: normocephalic, sclerae anicteric, TMs pearly, nares patent, no discharge or erythema, pharynx normal Oral cavity: MMM, no lesions Neck: supple, no lymphadenopathy, no thyromegaly, no masses Heart: RRR, normal S1, S2, no murmurs Lungs: CTA bilaterally, no wheezes, rhonchi, or rales Abdomen: +bs, soft, non tender, non distended, no masses, no hepatomegaly, no splenomegaly Musculoskeletal: nontender, no swelling, no obvious deformity Extremities: no edema, no cyanosis, no clubbing Pulses: 2+ symmetric, upper and lower extremities, normal cap refill Neurological: alert, oriented x 3, CN2-12 intact, strength normal upper extremities and lower extremities, sensation normal throughout, DTRs 2+ throughout, no cerebellar signs, gait normal Psychiatric: normal affect, behavior normal, pleasant  Breast: defer Gyn: defer Rectal: defer  Assessment:   1. PAF (paroxysmal atrial fibrillation) Follow up Coumadin clinic  2. HTN (hypertension) - CBC  with Differential - BASIC METABOLIC PANEL WITH GFR - Hepatic function panel - Urinalysis, Routine w reflex microscopic - Microalbumin / creatinine urine ratio  3. Tachycardia-bradycardia  4. Type II or unspecified type diabetes mellitus without mention of complication, not stated as uncontrolled - Hemoglobin A1c - HM DIABETES FOOT EXAM  5. Abnormal finding on thyroid function test - TSH  6. Hyperlipidemia - Lipid panel  7. Acute cystitis - Urine culture  8. Left hip pain - DG Hip Complete Left; Future   Plan:   During the course of the visit the patient was educated and counseled about appropriate screening and preventive services including:    Pneumococcal vaccine   Influenza vaccine  Td vaccine  Screening electrocardiogram  Screening mammography  Bone densitometry screening  Colorectal cancer screening  Diabetes screening  Glaucoma screening  Nutrition counseling   Advanced directives: information given  Screening recommendations, referrals:  Vaccinations: Tdap vaccine no  Influenza vaccine no Pneumococcal vaccine no Shingles vaccine no Hep B vaccine no  Nutrition assessed and recommended  Colonoscopy no Mammogram yes Pap smear no Pelvic exam no Recommended yearly ophthalmology/optometry visit for glaucoma screening and checkup Recommended yearly dental visit for hygiene and checkup Advanced directives - yes  Conditions/risks identified: BMI: Discussed weight loss, diet, and increase physical activity.  Increase physical activity: AHA recommends 150 minutes of physical activity a week.  Medications reviewed DEXA- declined Diabetes at goal, ACE/ARB therapy Yes. Urinary Incontinence is an issue: discussed non pharmacology and pharmacology options.  Fall risk: high- discussed PT, home fall assessment, medications.   Medicare Attestation I have personally reviewed: The patient's medical and social history Their use of alcohol, tobacco or  illicit drugs Their current medications and supplements The patient's functional ability including ADLs,fall risks, home safety risks, cognitive, and hearing and visual impairment Diet and physical activities Evidence for depression or mood disorders  The patient's weight, height, BMI, and visual acuity have been recorded in the chart.  I have made referrals, counseling, and provided education to the patient based on review of the above and I have provided the patient with a written personalized care plan for preventive services.     Vicie Mutters, PA-C   02/09/2014

## 2014-02-10 ENCOUNTER — Other Ambulatory Visit: Payer: Self-pay | Admitting: Physician Assistant

## 2014-02-10 DIAGNOSIS — M25552 Pain in left hip: Secondary | ICD-10-CM

## 2014-02-10 LAB — URINALYSIS, ROUTINE W REFLEX MICROSCOPIC
Bilirubin Urine: NEGATIVE
GLUCOSE, UA: 500 mg/dL — AB
Hgb urine dipstick: NEGATIVE
Ketones, ur: NEGATIVE mg/dL
LEUKOCYTES UA: NEGATIVE
NITRITE: NEGATIVE
PROTEIN: NEGATIVE mg/dL
Specific Gravity, Urine: 1.02 (ref 1.005–1.030)
UROBILINOGEN UA: 0.2 mg/dL (ref 0.0–1.0)
pH: 6 (ref 5.0–8.0)

## 2014-02-10 LAB — MICROALBUMIN / CREATININE URINE RATIO
Creatinine, Urine: 68.3 mg/dL
MICROALB UR: 3.26 mg/dL — AB (ref 0.00–1.89)
Microalb Creat Ratio: 47.7 mg/g — ABNORMAL HIGH (ref 0.0–30.0)

## 2014-02-11 LAB — URINE CULTURE
Colony Count: NO GROWTH
Organism ID, Bacteria: NO GROWTH

## 2014-02-15 DIAGNOSIS — S300XXA Contusion of lower back and pelvis, initial encounter: Secondary | ICD-10-CM | POA: Diagnosis not present

## 2014-02-28 ENCOUNTER — Other Ambulatory Visit: Payer: Self-pay | Admitting: Physician Assistant

## 2014-02-28 ENCOUNTER — Other Ambulatory Visit: Payer: Self-pay

## 2014-02-28 MED ORDER — MIRABEGRON ER 25 MG PO TB24: 25.0000 mg | ORAL_TABLET | Freq: Every day | ORAL | Status: DC

## 2014-03-01 ENCOUNTER — Ambulatory Visit (INDEPENDENT_AMBULATORY_CARE_PROVIDER_SITE_OTHER): Payer: Medicare Other | Admitting: *Deleted

## 2014-03-01 DIAGNOSIS — Z5181 Encounter for therapeutic drug level monitoring: Secondary | ICD-10-CM

## 2014-03-01 DIAGNOSIS — Z7901 Long term (current) use of anticoagulants: Secondary | ICD-10-CM | POA: Diagnosis not present

## 2014-03-01 DIAGNOSIS — I4891 Unspecified atrial fibrillation: Secondary | ICD-10-CM | POA: Diagnosis not present

## 2014-03-01 DIAGNOSIS — S300XXA Contusion of lower back and pelvis, initial encounter: Secondary | ICD-10-CM | POA: Diagnosis not present

## 2014-03-01 LAB — POCT INR: INR: 2.4

## 2014-03-07 ENCOUNTER — Other Ambulatory Visit (HOSPITAL_COMMUNITY): Payer: Self-pay | Admitting: Internal Medicine

## 2014-03-10 DIAGNOSIS — M545 Low back pain, unspecified: Secondary | ICD-10-CM | POA: Diagnosis not present

## 2014-03-10 DIAGNOSIS — S32009A Unspecified fracture of unspecified lumbar vertebra, initial encounter for closed fracture: Secondary | ICD-10-CM | POA: Diagnosis not present

## 2014-03-10 DIAGNOSIS — M818 Other osteoporosis without current pathological fracture: Secondary | ICD-10-CM | POA: Diagnosis not present

## 2014-03-30 DIAGNOSIS — M545 Low back pain, unspecified: Secondary | ICD-10-CM | POA: Diagnosis not present

## 2014-03-31 DIAGNOSIS — H35039 Hypertensive retinopathy, unspecified eye: Secondary | ICD-10-CM | POA: Diagnosis not present

## 2014-03-31 DIAGNOSIS — H01019 Ulcerative blepharitis unspecified eye, unspecified eyelid: Secondary | ICD-10-CM | POA: Diagnosis not present

## 2014-03-31 DIAGNOSIS — H524 Presbyopia: Secondary | ICD-10-CM | POA: Diagnosis not present

## 2014-03-31 DIAGNOSIS — H35359 Cystoid macular degeneration, unspecified eye: Secondary | ICD-10-CM | POA: Diagnosis not present

## 2014-03-31 DIAGNOSIS — IMO0001 Reserved for inherently not codable concepts without codable children: Secondary | ICD-10-CM | POA: Diagnosis not present

## 2014-04-06 ENCOUNTER — Other Ambulatory Visit (HOSPITAL_COMMUNITY): Payer: Self-pay | Admitting: Orthopedic Surgery

## 2014-04-06 DIAGNOSIS — M818 Other osteoporosis without current pathological fracture: Secondary | ICD-10-CM

## 2014-04-08 DIAGNOSIS — E11359 Type 2 diabetes mellitus with proliferative diabetic retinopathy without macular edema: Secondary | ICD-10-CM | POA: Diagnosis not present

## 2014-04-08 DIAGNOSIS — H353190 Nonexudative age-related macular degeneration, unspecified eye, stage unspecified: Secondary | ICD-10-CM | POA: Diagnosis not present

## 2014-04-08 DIAGNOSIS — H353290 Exudative age-related macular degeneration, unspecified eye, stage unspecified: Secondary | ICD-10-CM | POA: Diagnosis not present

## 2014-04-08 DIAGNOSIS — E1139 Type 2 diabetes mellitus with other diabetic ophthalmic complication: Secondary | ICD-10-CM | POA: Diagnosis not present

## 2014-04-13 ENCOUNTER — Other Ambulatory Visit: Payer: Self-pay | Admitting: Internal Medicine

## 2014-04-15 ENCOUNTER — Ambulatory Visit (HOSPITAL_COMMUNITY): Admission: RE | Admit: 2014-04-15 | Payer: Medicare Other | Source: Ambulatory Visit

## 2014-04-25 ENCOUNTER — Other Ambulatory Visit: Payer: Self-pay | Admitting: Emergency Medicine

## 2014-04-26 ENCOUNTER — Ambulatory Visit (HOSPITAL_COMMUNITY)
Admission: RE | Admit: 2014-04-26 | Discharge: 2014-04-26 | Disposition: A | Discharge status: Home / Self Care | Payer: Medicare Other | Source: Ambulatory Visit | Attending: Orthopedic Surgery | Admitting: Orthopedic Surgery

## 2014-04-26 DIAGNOSIS — M810 Age-related osteoporosis without current pathological fracture: Secondary | ICD-10-CM | POA: Diagnosis not present

## 2014-04-26 DIAGNOSIS — M818 Other osteoporosis without current pathological fracture: Secondary | ICD-10-CM

## 2014-04-26 DIAGNOSIS — Z780 Asymptomatic menopausal state: Secondary | ICD-10-CM | POA: Insufficient documentation

## 2014-04-26 DIAGNOSIS — Z13820 Encounter for screening for osteoporosis: Secondary | ICD-10-CM | POA: Diagnosis not present

## 2014-05-09 DIAGNOSIS — H01009 Unspecified blepharitis unspecified eye, unspecified eyelid: Secondary | ICD-10-CM | POA: Diagnosis not present

## 2014-05-09 DIAGNOSIS — H029 Unspecified disorder of eyelid: Secondary | ICD-10-CM | POA: Diagnosis not present

## 2014-05-09 DIAGNOSIS — H01019 Ulcerative blepharitis unspecified eye, unspecified eyelid: Secondary | ICD-10-CM | POA: Diagnosis not present

## 2014-05-09 DIAGNOSIS — Z961 Presence of intraocular lens: Secondary | ICD-10-CM | POA: Diagnosis not present

## 2014-05-23 ENCOUNTER — Telehealth: Payer: Self-pay

## 2014-05-23 ENCOUNTER — Other Ambulatory Visit: Payer: Self-pay | Admitting: Internal Medicine

## 2014-05-23 NOTE — Telephone Encounter (Signed)
Patient requesting Rx for dry cough. Not productive. No sore throat. [facility]. Please advise

## 2014-05-25 DIAGNOSIS — H5010 Unspecified exotropia: Secondary | ICD-10-CM | POA: Diagnosis not present

## 2014-05-25 DIAGNOSIS — IMO0002 Reserved for concepts with insufficient information to code with codable children: Secondary | ICD-10-CM | POA: Diagnosis not present

## 2014-06-04 ENCOUNTER — Other Ambulatory Visit: Payer: Self-pay | Admitting: Internal Medicine

## 2014-06-07 ENCOUNTER — Encounter: Payer: Self-pay | Admitting: *Deleted

## 2014-06-09 ENCOUNTER — Encounter: Payer: Self-pay | Admitting: Physician Assistant

## 2014-06-09 ENCOUNTER — Ambulatory Visit (INDEPENDENT_AMBULATORY_CARE_PROVIDER_SITE_OTHER): Payer: Medicare Other | Admitting: Physician Assistant

## 2014-06-09 VITALS — BP 122/62 | HR 76 | Temp 98.1°F | Resp 16 | Wt 154.0 lb

## 2014-06-09 DIAGNOSIS — Z7901 Long term (current) use of anticoagulants: Secondary | ICD-10-CM

## 2014-06-09 DIAGNOSIS — J209 Acute bronchitis, unspecified: Secondary | ICD-10-CM | POA: Diagnosis not present

## 2014-06-09 LAB — PROTIME-INR
INR: 1.87 — ABNORMAL HIGH (ref <1.50)
Prothrombin Time: 21.5 seconds — ABNORMAL HIGH (ref 11.6–15.2)

## 2014-06-09 MED ORDER — AZITHROMYCIN 250 MG PO TABS: ORAL_TABLET | ORAL | Status: AC

## 2014-06-09 NOTE — Progress Notes (Signed)
   Subjective:    Patient ID: Kylie Sanchez, female    DOB: 1925/06/04, 80 y.o.   MRN: [id number]  Cough This is a new problem. Episode onset: 3 weeks. The problem has been gradually worsening. The cough is productive of purulent sputum. Associated symptoms include ear pain, a sore throat and wheezing. Pertinent negatives include no chest pain, chills, ear congestion, fever, headaches, heartburn, hemoptysis, myalgias, nasal congestion, postnasal drip, rash, rhinorrhea, shortness of breath, sweats or weight loss. She has tried OTC cough suppressant for the symptoms. The treatment provided mild relief.   Has left nasal ulcer, states she is seeing Dr. Herbert Deaner for this.  She is on coumadin for her Afib, she has not seen the coumadin clinic for her INR in 2 months due to difficult to get rides. She is on coumadin 5mg , she takes 1/2 daily except Monday she takes a whole pill. We will check it today and we will consider do home health INR or if coumadin stable q 6 weeks.    Review of Systems  Constitutional: Negative for fever, chills, weight loss, diaphoresis, activity change, appetite change and fatigue.  HENT: Positive for ear pain and sore throat. Negative for postnasal drip and rhinorrhea.   Respiratory: Positive for cough and wheezing. Negative for hemoptysis, chest tightness and shortness of breath.   Cardiovascular: Negative.  Negative for chest pain.  Gastrointestinal: Negative.  Negative for heartburn.  Musculoskeletal: Negative for myalgias.  Skin: Negative for rash.  Neurological: Negative.  Negative for headaches.       Objective:   Physical Exam  Constitutional: She is oriented to person, place, and time. She appears well-developed and well-nourished.  HENT:  Head: Normocephalic and atraumatic.  Right Ear: External ear normal.  Left Ear: External ear normal.  Nose: Nose normal.  Mouth/Throat: Oropharynx is clear and moist.  Left eye with psotisis and nonhealing ulcer medially   Eyes: Conjunctivae are normal. Pupils are equal, round, and reactive to light.  Neck: Normal range of motion. Neck supple.  Cardiovascular: Normal rate and regular rhythm.   Pulmonary/Chest: Effort normal. No respiratory distress. She has wheezes. She has no rales. She exhibits no tenderness.  Abdominal: Soft. Bowel sounds are normal.  Lymphadenopathy:    She has no cervical adenopathy.  Neurological: She is alert and oriented to person, place, and time.  Skin: Skin is warm and dry.       Assessment & Plan:  1. Acute bronchitis, unspecified organism Zpak, continue OTC cough medication  2. Chronic anticoagulation She has not had it checked in 2 months, will check today before starting ABX, will given instructions.  May need to start to do at home, can not drive and has a hard time getting rides.  - Protime-INR  3. Left eye ulcer Follow up with Dr. Herbert Deaner

## 2014-06-09 NOTE — Patient Instructions (Signed)
We are checking your INR, depending on this number we will adjust your coumadin. The antibiotic we put you on can thin your blood, so we may have to decrease your coumadin amount.   Warfarin: What You Need to Know Warfarin is an anticoagulant. Anticoagulants help prevent the formation of blood clots. They also help stop the growth of blood clots. Warfarin is sometimes referred to as a "blood thinner."  Normally, when body tissues are cut or damaged, the blood clots in order to prevent blood loss. Sometimes clots form inside your blood vessels and obstruct the flow of blood through your circulatory system (thrombosis). These clots may travel through your bloodstream and become lodged in smaller blood vessels in your brain, which can cause a stroke, or in your lungs (pulmonary embolism). WHO SHOULD USE WARFARIN? Warfarin is prescribed for people at risk of developing harmful blood clots:  People with surgically implanted mechanical heart valves, irregular heart rhythms called atrial fibrillation, and certain clotting disorders.  People who have developed harmful blood clotting in the past, including those who have had a stroke or a pulmonary embolism, or thrombosis in their legs (deep vein thrombosis [DVT]).  People with an existing blood clot, such as a pulmonary embolism. WARFARIN DOSING Warfarin tablets come in different strengths. Each tablet strength is a different color, with the amount of warfarin (in milligrams) clearly printed on the tablet. If the color of your tablet is different than usual when you receive a new prescription, report it immediately to your pharmacist or health care provider. WARFARIN MONITORING The goal of warfarin therapy is to lessen the clotting tendency of blood but not prevent clotting completely. Your health care provider will monitor the anticoagulation effect of warfarin closely and adjust your dose as needed. For your safety, blood tests called prothrombin time (PT)  or international normalized ratio (INR) are used to measure the effects of warfarin. Both of these tests can be done with a finger stick or a blood draw. The longer it takes the blood to clot, the higher the PT or INR. Your health care provider will inform you of your "target" PT or INR range. If, at any time, your PT or INR is above the target range, there is a risk of bleeding. If your PT or INR is below the target range, there is a risk of clotting. Whether you are started on warfarin while you are in the hospital or in your health care provider's office, you will need to have your PT or INR checked within one week of starting the medicine. Initially, some people are asked to have their PT or INR checked as much as twice a week. Once you are on a stable maintenance dose, the PT or INR is checked less often, usually once every 2 to 4 weeks. The warfarin dose may be adjusted if the PT or INR is not within the target range. It is important to keep all laboratory and health care provider follow-up appointments. Not keeping appointments could result in a chronic or permanent injury, pain, or disability because warfarin is a medicine that requires close monitoring. WHAT ARE THE SIDE EFFECTS OF WARFARIN?  Too much warfarin can cause bleeding (hemorrhage) from any part of the body. This may include bleeding from the gums, blood in the urine, bloody or dark stools, a nosebleed that is not easily stopped, coughing up blood, or vomiting blood.  Too little warfarin can increase the risk of blood clots.  Too little or too much  warfarin can also increase the risk of a stroke.  Warfarin use may cause a skin rash or irritation, an unusual fever, continual nausea or stomach upset, or severe pain in your joints or back. SPECIAL PRECAUTIONS WHILE TAKING WARFARIN Warfarin should be taken exactly as directed. It is very important to take warfarin as directed since bleeding or blood clots could result in chronic or  permanent injury, pain, or disability.  Take your medicine at the same time every day. If you forget to take your dose, you can take it if it is within 6 hours of when it was due.  Do not change the dose of warfarin on your own to make up for missed or extra doses.  If you miss more than 2 doses in a row, you should contact your health care provider for advice. Avoid situations that cause bleeding. You may have a tendency to bleed more easily than usual while taking warfarin. The following actions can limit bleeding:  Using a softer toothbrush.  Flossing with waxed floss rather than unwaxed floss.  Shaving with an Copy rather than a blade.  Limiting the use of sharp objects.  Avoiding potentially harmful activities, such as contact sports. Warfarin and Pregnancy or Breastfeeding  Warfarin is not advised during the first trimester of pregnancy due to an increased risk of birth defects. In certain situations, a woman may take warfarin after her first trimester of pregnancy. A woman who becomes pregnant or plans to become pregnant while taking warfarin should notify her health care provider immediately.  Although warfarin does not pass into breast milk, a woman who wishes to breastfeed while taking warfarin should also consult with her health care provider. Alcohol, Smoking, and Illicit Drug Use  Alcohol affects how warfarin works in the body. It is best to avoid alcoholic drinks or consume very small amounts while taking warfarin. In general, alcohol intake should be limited to 1 oz (30 mL) of liquor, 6 oz (180 mL) of wine, or 12 oz (360 mL) of beer each day. Notify your health care provider if you change your alcohol intake.  Smoking affects how warfarin works. It is best to avoid smoking while taking warfarin. Notify your health care provider if you change your smoking habits.  It is best to avoid all illicit drugs while taking warfarin since there are few studies that show how  warfarin interacts with these drugs. Other Medicines and Dietary Supplements Many prescription and over-the-counter medicines can interfere with warfarin. Be sure all of your health care providers know you are taking warfarin. Notify your health care provider who prescribed warfarin for you or your pharmacist before starting or stopping any new medicines, including over-the-counter vitamins, dietary supplements, and pain medicines. Your warfarin dose may need to be adjusted. Some common over-the-counter medicines that may increase the risk of bleeding while taking warfarin include:   Acetaminophen.  Aspirin.  Nonsteroidal anti-inflammatory medicines (NSAIDs), such as ibuprofen or naproxen.  Vitamin E. Dietary Considerations  Foods that have moderate or high amounts of vitamin K can interfere with warfarin. Avoid major changes in your diet or notify your health care provider before changing your diet. Eat a consistent amount of foods that have moderate or high amounts of vitamin K. Eating less foods containing vitamin K can increase the risk of bleeding. Eating more foods containing vitamin K can increase the risk of blood clots. Additional questions about dietary considerations can be discussed with a dietitian. Foods that are very high in  vitamin K:  Greens, such as Swiss chard and beet, collard, mustard, or turnip greens (fresh or frozen, cooked).  Kale (fresh or frozen, cooked).  Parsley (raw).  Spinach (cooked). Foods that are high in vitamin K:  Asparagus (frozen, cooked).  Beans, green (frozen, cooked).  Broccoli.  Bok choy (cooked).  Brussels sprouts (fresh or frozen, cooked).  Cabbage (cooked).   Coleslaw. Foods that are moderately high in vitamin K:  Blueberries.  Black-eyed peas.  Endive (raw).  Green leaf lettuce (raw).  Green scallions (raw).  Kale (raw).  Okra (frozen, cooked).  Plantains (fried).  Romaine lettuce (raw).  Sauerkraut  (canned).  Spinach (raw). CALL YOUR CLINIC OR HEALTH CARE PROVIDER IF YOU:  Plan to have any surgery or procedure.  Feel sick, especially if you have diarrhea or vomiting.  Experience or anticipate any major changes in your diet.  Start or stop a prescription or over-the-counter medicine.  Become, plan to become, or think you may be pregnant.  Are having heavier than usual menstrual periods.  Have had a fall, accident, or any symptoms of bleeding or unusual bruising.  Develop an unusual fever. CALL 911 IN THE [place] OR GO TO THE EMERGENCY DEPARTMENT IF YOU:   Think you may be having an allergic reaction to warfarin. The signs of an allergic reaction could include itching, rash, hives, swelling, chest tightness, or trouble breathing.  See signs of blood in your urine. The signs could include reddish, pinkish, or tea-colored urine.  See signs of blood in your stools. The signs could include bright red or black stools.  Vomit or cough up blood. In these instances, the blood could have either a bright red or a "coffee-grounds" appearance.  Have bleeding that will not stop after applying pressure for 30 minutes such as cuts, nosebleeds, or other injuries.  Have severe pain in your joints or back.  Have a new and severe headache.  Have sudden weakness or numbness of your face, arm, or leg, especially on one side of your body.  Have sudden confusion or trouble understanding.  Have sudden trouble seeing in one or both eyes.  Have sudden trouble walking, dizziness, loss of balance, or coordination.  Have trouble speaking or understanding (aphasia). Document Released: 10/28/2005 Document Revised: 03/14/2014 Document Reviewed: 04/23/2013 [facility] Patient Information 2015 [place], [place]. This information is not intended to replace advice given to you by your health care provider. Make sure you discuss any questions you have with your health care provider.

## 2014-06-21 ENCOUNTER — Other Ambulatory Visit: Payer: Self-pay | Admitting: Internal Medicine

## 2014-07-07 ENCOUNTER — Other Ambulatory Visit: Payer: Self-pay | Admitting: Internal Medicine

## 2014-07-11 DIAGNOSIS — H05339 Deformity of unspecified orbit due to trauma or surgery: Secondary | ICD-10-CM | POA: Diagnosis not present

## 2014-07-11 DIAGNOSIS — H0530 Unspecified deformity of orbit: Secondary | ICD-10-CM | POA: Diagnosis not present

## 2014-07-11 DIAGNOSIS — H11439 Conjunctival hyperemia, unspecified eye: Secondary | ICD-10-CM | POA: Diagnosis not present

## 2014-07-11 DIAGNOSIS — H02849 Edema of unspecified eye, unspecified eyelid: Secondary | ICD-10-CM | POA: Diagnosis not present

## 2014-07-20 ENCOUNTER — Other Ambulatory Visit: Payer: Self-pay | Admitting: *Deleted

## 2014-07-20 MED ORDER — LOSARTAN POTASSIUM 100 MG PO TABS: ORAL_TABLET | ORAL | Status: DC

## 2014-07-26 DIAGNOSIS — H5010 Unspecified exotropia: Secondary | ICD-10-CM | POA: Diagnosis not present

## 2014-07-26 DIAGNOSIS — H579 Unspecified disorder of eye and adnexa: Secondary | ICD-10-CM | POA: Diagnosis not present

## 2014-07-27 DIAGNOSIS — H353290 Exudative age-related macular degeneration, unspecified eye, stage unspecified: Secondary | ICD-10-CM | POA: Diagnosis not present

## 2014-07-27 DIAGNOSIS — E11359 Type 2 diabetes mellitus with proliferative diabetic retinopathy without macular edema: Secondary | ICD-10-CM | POA: Diagnosis not present

## 2014-07-27 DIAGNOSIS — H353190 Nonexudative age-related macular degeneration, unspecified eye, stage unspecified: Secondary | ICD-10-CM | POA: Diagnosis not present

## 2014-07-27 DIAGNOSIS — E1139 Type 2 diabetes mellitus with other diabetic ophthalmic complication: Secondary | ICD-10-CM | POA: Diagnosis not present

## 2014-08-03 ENCOUNTER — Other Ambulatory Visit: Payer: Self-pay | Admitting: Internal Medicine

## 2014-08-09 ENCOUNTER — Ambulatory Visit: Payer: Self-pay | Admitting: Physician Assistant

## 2014-08-10 ENCOUNTER — Other Ambulatory Visit: Payer: Self-pay | Admitting: Internal Medicine

## 2014-08-22 ENCOUNTER — Encounter: Payer: Self-pay | Admitting: Emergency Medicine

## 2014-08-22 ENCOUNTER — Emergency Department (INDEPENDENT_AMBULATORY_CARE_PROVIDER_SITE_OTHER)
Admission: EM | Admit: 2014-08-22 | Discharge: 2014-08-22 | Disposition: A | Discharge status: Home / Self Care | Payer: Medicare Other | Source: Home / Self Care

## 2014-08-22 DIAGNOSIS — D485 Neoplasm of uncertain behavior of skin: Secondary | ICD-10-CM | POA: Diagnosis not present

## 2014-08-22 DIAGNOSIS — R079 Chest pain, unspecified: Secondary | ICD-10-CM

## 2014-08-22 NOTE — ED Provider Notes (Signed)
CSN: [id number]     Arrival date & time 08/22/14  0830 History   None    Chief Complaint  Patient presents with  . Chest Pain   (Consider location/radiation/quality/duration/timing/severity/associated sxs/prior Treatment) Patient is a 80 y.o. female presenting with chest pain. The history is provided by the patient. No language interpreter was used.  Chest Pain Pain location:  Substernal area Pain quality: aching   Pain radiates to:  Does not radiate Pain radiates to the back: no   Pain severity:  No pain Onset quality:  Gradual Duration:  15 minutes Progression:  Worsening Chronicity:  New Context: no movement   Relieved by:  Nothing Worsened by:  Nothing tried Ineffective treatments:  None tried Associated symptoms: no abdominal pain and no nausea   Risk factors: smoking     Past Medical History  Diagnosis Date  . Dizziness   . Cataract   . Paroxysmal atrial fibrillation   . Sick sinus syndrome   . Type II or unspecified type diabetes mellitus without mention of complication, not stated as uncontrolled   . Hyperlipidemia   . HTN (hypertension)   . Retinopathy   . CKD (chronic kidney disease) stage 3, GFR 30-59 ml/min     due to DM   Past Surgical History  Procedure Laterality Date  . Pacemaker insertion  04/20/12    MDT Adapta L implanted by Dr Rayann Heman  . Dental surgery     Family History  Problem Relation Age of Onset  . Heart attack Father   . Hypertension Mother   . Cancer Other    History  Substance Use Topics  . Smoking status: Never Smoker   . Smokeless tobacco: Not on file  . Alcohol Use: No   OB History   Grav Para Term Preterm Abortions TAB SAB Ect Mult Living                 Review of Systems  Cardiovascular: Positive for chest pain.  Gastrointestinal: Negative for nausea and abdominal pain.  All other systems reviewed and are negative.   Allergies  Review of patient's allergies indicates no known allergies.  Home Medications    Prior to Admission medications   Medication Sig Start Date End Date Taking? Authorizing Provider  amLODipine (NORVASC) 10 MG tablet TAKE ONE-HALF TABLET BY MOUTH ONCE DAILY **PATIENT  NEEDS  TO  SCHEDULE  FOLLOW  UP  FOR  FURTHER  REFILLS** 08/11/14  Yes Thompson Grayer, MD  b complex vitamins tablet Take 1 tablet by mouth daily.   Yes Historical Provider, MD  Coenzyme Q10 (CO Q-10) 200 MG CAPS Take by mouth daily.   Yes Historical Provider, MD  glimepiride (AMARYL) 1 MG tablet TAKE ONE TABLET BY MOUTH EVERY DAY 05/23/14  Yes Unk Pinto, MD  losartan (COZAAR) 100 MG tablet TAKE ONE TABLET BY MOUTH ONCE DAILY 07/20/14  Yes Thompson Grayer, MD  Magnesium 250 MG TABS Take by mouth daily.   Yes Historical Provider, MD  metFORMIN (GLUCOPHAGE) 500 MG tablet TAKE TWO TABLETS BY MOUTH TWICE DAILY 08/03/14  Yes Unk Pinto, MD  metoprolol succinate (TOPROL-XL) 100 MG 24 hr tablet Take 1 tablet (100 mg total) by mouth daily. Take with or immediately following a meal. 04/21/12 08/22/14 Yes Brooke O Edmisten, PA-C  ONE TOUCH ULTRA TEST test strip USE ONE STRIP TO TEST BLOOD GLUCOSE TWICE DAILY 10/02/13  Yes Melissa R Smith, PA-C  pravastatin (PRAVACHOL) 40 MG tablet TAKE ONE TABLET BY MOUTH AT BEDTIME  FOR CHOLESTEROL 04/25/14  Yes Vicie Mutters, PA-C  warfarin (COUMADIN) 5 MG tablet TAKE AS DIRECTED 03/07/14  Yes Thompson Grayer, MD  mirabegron ER (MYRBETRIQ) 25 MG TB24 tablet Take 1 tablet (25 mg total) by mouth daily. 02/28/14   Vicie Mutters, PA-C   BP 203/80  Pulse 81  Temp(Src) 97.8 F (36.6 C) (Oral)  Resp 18  Ht 5\' 2"  (1.575 m)  Wt 150 lb (68.04 kg)  BMI 27.43 kg/m2  SpO2 97% Physical Exam  Nursing note and vitals reviewed. Constitutional: She is oriented to person, place, and time. She appears well-developed and well-nourished.  HENT:  Head: Normocephalic and atraumatic.  Eyes: Conjunctivae and EOM are normal. Pupils are equal, round, and reactive to light.  Neck: Normal range of motion.   Cardiovascular: Normal rate and normal heart sounds.   Pulmonary/Chest: Effort normal and breath sounds normal.  Abdominal: She exhibits no distension.  Musculoskeletal: Normal range of motion.  Neurological: She is alert and oriented to person, place, and time.  Skin: Skin is warm.  Psychiatric: She has a normal mood and affect.    ED Course  Procedures (including critical care time) Labs Review Labs Reviewed - No data to display  Imaging Review No results found. EKG Normal sinus rate of 79   No stemi,  No st changes,     MDM Pt is currently pain free.   Pt has risk for coronary artery disease but does is not currently having pain.   Pt needs cardiac evaluation for coronary artery disease but I do not feel she is having an MI or needs evaluation for MI.   I spoke to [facility] cardiology who will see tommmorow   1. Chest pain, unspecified chest pain type    I spoke with Producer, television/film/video at [employer].   [place] office will call pt today with appointment time   Fransico Meadow, PA-C 08/22/14 0900  Fransico Meadow, PA-C 08/22/14 [place], PA-C 08/27/14 [phone]

## 2014-08-22 NOTE — Discharge Instructions (Signed)

## 2014-08-22 NOTE — ED Notes (Signed)
Pt c/o intermittent center of her chest pain x 7-8 mths, with an episode this morning last about 15 minutes. She describe the pain as pressure. Denies any other s/s. Her cardiology is Dr. Rayann Heman with [facility], she last saw him 1 year ago. She does have a pacemaker in place.

## 2014-08-23 ENCOUNTER — Other Ambulatory Visit: Payer: Self-pay | Admitting: Physician Assistant

## 2014-08-24 DIAGNOSIS — C44119 Basal cell carcinoma of skin of left eyelid, including canthus: Secondary | ICD-10-CM | POA: Diagnosis not present

## 2014-08-25 ENCOUNTER — Telehealth: Payer: Self-pay | Admitting: Emergency Medicine

## 2014-08-29 NOTE — ED Provider Notes (Signed)
Medical history/examination/treatment/procedure(s) were performed by non-physician provider and as supervising physician I was immediately available for consultation/collaboration.   Jacqulyn Cane, MD 08/29/14 [phone]

## 2014-09-04 ENCOUNTER — Other Ambulatory Visit: Payer: Self-pay | Admitting: Internal Medicine

## 2014-09-08 ENCOUNTER — Other Ambulatory Visit: Payer: Self-pay

## 2014-09-09 ENCOUNTER — Encounter: Payer: Self-pay | Admitting: Internal Medicine

## 2014-09-09 ENCOUNTER — Ambulatory Visit (INDEPENDENT_AMBULATORY_CARE_PROVIDER_SITE_OTHER): Payer: Medicare Other | Admitting: Internal Medicine

## 2014-09-09 VITALS — BP 162/70 | HR 87 | Ht 62.0 in | Wt 152.1 lb

## 2014-09-09 DIAGNOSIS — Z7901 Long term (current) use of anticoagulants: Secondary | ICD-10-CM

## 2014-09-09 DIAGNOSIS — I480 Paroxysmal atrial fibrillation: Secondary | ICD-10-CM | POA: Diagnosis not present

## 2014-09-09 DIAGNOSIS — I119 Hypertensive heart disease without heart failure: Secondary | ICD-10-CM

## 2014-09-09 DIAGNOSIS — I495 Sick sinus syndrome: Secondary | ICD-10-CM | POA: Diagnosis not present

## 2014-09-09 LAB — CBC WITH DIFFERENTIAL/PLATELET
BASOS PCT: 0.6 % (ref 0.0–3.0)
Basophils Absolute: 0 10*3/uL (ref 0.0–0.1)
EOS PCT: 3.4 % (ref 0.0–5.0)
Eosinophils Absolute: 0.2 10*3/uL (ref 0.0–0.7)
HEMATOCRIT: 34.3 % — AB (ref 36.0–46.0)
HEMOGLOBIN: 11.4 g/dL — AB (ref 12.0–15.0)
LYMPHS ABS: 2.1 10*3/uL (ref 0.7–4.0)
Lymphocytes Relative: 32.1 % (ref 12.0–46.0)
MCHC: 33.3 g/dL (ref 30.0–36.0)
MCV: 89.9 fl (ref 78.0–100.0)
Monocytes Absolute: 0.4 10*3/uL (ref 0.1–1.0)
Monocytes Relative: 5.4 % (ref 3.0–12.0)
Neutro Abs: 3.8 10*3/uL (ref 1.4–7.7)
Neutrophils Relative %: 58.5 % (ref 43.0–77.0)
Platelets: 291 10*3/uL (ref 150.0–400.0)
RBC: 3.82 Mil/uL — AB (ref 3.87–5.11)
RDW: 13.4 % (ref 11.5–15.5)
WBC: 6.5 10*3/uL (ref 4.0–10.5)

## 2014-09-09 LAB — BASIC METABOLIC PANEL
BUN: 26 mg/dL — ABNORMAL HIGH (ref 6–23)
CHLORIDE: 107 meq/L (ref 96–112)
CO2: 24 mEq/L (ref 19–32)
Calcium: 9.4 mg/dL (ref 8.4–10.5)
Creatinine, Ser: 1.3 mg/dL — ABNORMAL HIGH (ref 0.4–1.2)
GFR: 39.74 mL/min — ABNORMAL LOW (ref >60.00)
Glucose, Bld: 82 mg/dL (ref 70–99)
POTASSIUM: 5 meq/L (ref 3.5–5.1)
Sodium: 139 mEq/L (ref 135–145)

## 2014-09-09 LAB — PROTIME-INR
INR: 2.3 ratio — ABNORMAL HIGH (ref 0.8–1.0)
Prothrombin Time: 24.8 s — ABNORMAL HIGH (ref 9.6–13.1)

## 2014-09-09 MED ORDER — AMLODIPINE BESYLATE 10 MG PO TABS: ORAL_TABLET | ORAL | Status: DC

## 2014-09-09 NOTE — Patient Instructions (Addendum)
Remote monitoring is used to monitor your Pacemaker of ICD from home. This monitoring reduces the number of office visits required to check your device to one time per year. It allows [place] to keep an eye on the functioning of your device to ensure it is working properly. You are scheduled for a device check from home on 12/13/13. You may send your transmission at any time that day. If you have a wireless device, the transmission will be sent automatically. After your physician reviews your transmission, you will receive a postcard with your next transmission date.    Your physician wants you to follow-up in: 12 months with Dr Rayann Heman.  You will receive a reminder letter in the mail two months in advance. If you don't receive a letter, please call our office to schedule the follow-up appointment.  Your physician recommends that you return for lab work today: BMP/CBC/INR today---will call you about starting Xarelto after labs are back    Your physician has recommended you make the following change in your medication:  1) Increase your Norvasc to 1 tablet (10 mg) by mouth daily

## 2014-09-10 ENCOUNTER — Encounter: Payer: Self-pay | Admitting: Internal Medicine

## 2014-09-10 NOTE — Progress Notes (Signed)
Kylie Sanchez is a 80 y.o. female who presents today for routine electrophysiology followup.  Since last being seen in our clinic, the patient reports doing very well. She has cancer on her L nose near her eye for which she is being evaluated and may require a procedure at [facility].   Today, she denies symptoms of palpitations, chest pain, shortness of breath, dizziness, presyncope, or syncope.  The patient is otherwise without complaint today.   Past Medical History  Diagnosis Date  . Dizziness   . Cataract   . Paroxysmal atrial fibrillation   . Sick sinus syndrome   . Type II or unspecified type diabetes mellitus without mention of complication, not stated as uncontrolled   . Hyperlipidemia   . Hypertensive cardiovascular disease   . Retinopathy   . CKD (chronic kidney disease) stage 3, GFR 30-59 ml/min     due to DM   Past Surgical History  Procedure Laterality Date  . Pacemaker insertion  04/20/12    MDT Adapta L implanted by Dr Rayann Heman  . Dental surgery      Current Outpatient Prescriptions  Medication Sig Dispense Refill  . amLODipine (NORVASC) 10 MG tablet Take 1 tablet (10 mg total) daily  90 tablet  3  . b complex vitamins tablet Take 1 tablet by mouth daily.      . Coenzyme Q10 (CO Q-10) 200 MG CAPS Take 1 capsule by mouth daily.       Marland Kitchen glimepiride (AMARYL) 1 MG tablet TAKE ONE TABLET BY MOUTH ONCE DAILY  90 tablet  0  . losartan (COZAAR) 100 MG tablet Take 1 tablet (100 mg total) by mouth daily.  30 tablet  0  . Magnesium 250 MG TABS Take 1 tablet by mouth daily.       . metFORMIN (GLUCOPHAGE) 500 MG tablet TAKE TWO TABLETS BY MOUTH TWICE DAILY  120 tablet  3  . metoprolol succinate (TOPROL-XL) 100 MG 24 hr tablet Take 50 mg by mouth 2 (two) times daily. Take with or immediately following a meal.      . ONE TOUCH ULTRA TEST test strip USE ONE STRIP TO TEST BLOOD GLUCOSE TWICE DAILY  100 each  6  . pravastatin (PRAVACHOL) 40 MG tablet TAKE ONE TABLET BY MOUTH ONCE DAILY AT BEDTIME  FOR  CHOLESTEROL  90 tablet  0  . warfarin (COUMADIN) 5 MG tablet TAKE AS DIRECTED  30 tablet  3   No current facility-administered medications for this visit.    Physical Exam: Filed Vitals:   09/09/14 1155  BP: 162/70  Pulse: 87  Height: 5\' 2"  (1.575 m)  Weight: 152 lb 1.9 oz (69.001 kg)    GEN- The patient is well appearing, alert and oriented x 3 today.   Head- normocephalic, atraumatic Eyes-  Sclera clear, conjunctiva pink Ears- hearing intact Oropharynx- clear, area of necrotic ulceration near her L eye/ nosal region Lungs- Clear to ausculation bilaterally, normal work of breathing Chest- pacemaker pocket is well healed Heart- regular rate and rhythm, no murmurs, rubs or gallops, PMI not laterally displaced GI- soft, NT, ND, + BS Extremities- no clubbing, cyanosis, or edema  Pacemaker interrogation- reviewed in detail today,  See PACEART report  Assessment and Plan:  1. Symptomatic bradycardia Normal pacemaker function See Pace Art report No changes today  2. HTN Above goal Increase norvasc to 10mg  daily bmet today  3. afib She has not had her INRs checked appropriately due to difficulty with travelling to the  office. Today, I discussed the importance of  Coumadin follow-up as well as alternatives of novel anticoagulants including Pradaxa, Xarelto, Savaysa, and Eliquis today as indicated for risk reduction in stroke and systemic emboli with nonvalvular atrial fibrillation.  Risks, benefits, and alternatives to each of these drugs were discussed at length today.  She would like to switch to xarelto at this time. I will therefore check BMET/ CBC today and start renal adjusted xarelto.  She will need to follow-up with the anticoagulation clinic in 4 weeks and then twice per year thereafter.  carelink every 3 months Return to see the EP NP in 6 months I will see again in 1 year

## 2014-09-13 ENCOUNTER — Other Ambulatory Visit: Payer: Self-pay | Admitting: *Deleted

## 2014-09-13 MED ORDER — RIVAROXABAN 15 MG PO TABS: 15.0000 mg | ORAL_TABLET | Freq: Every day | ORAL | Status: DC

## 2014-09-15 ENCOUNTER — Telehealth: Payer: Self-pay | Admitting: Internal Medicine

## 2014-09-15 ENCOUNTER — Other Ambulatory Visit: Payer: Self-pay | Admitting: Internal Medicine

## 2014-09-15 NOTE — Telephone Encounter (Signed)
Called and spoke with patient.

## 2014-09-15 NOTE — Telephone Encounter (Signed)
Spoke with patient and she is going to stay on Warfarin as the cost of Xarelto is too much.

## 2014-09-15 NOTE — Telephone Encounter (Signed)
New Message  Pt called reports she will no longer take Xarelto.. Requests a call back to discuss.Marland Kitchen

## 2014-09-20 ENCOUNTER — Encounter: Payer: Self-pay | Admitting: Physician Assistant

## 2014-09-20 ENCOUNTER — Ambulatory Visit (INDEPENDENT_AMBULATORY_CARE_PROVIDER_SITE_OTHER): Payer: Medicare Other | Admitting: Physician Assistant

## 2014-09-20 ENCOUNTER — Ambulatory Visit (HOSPITAL_COMMUNITY)
Admission: RE | Admit: 2014-09-20 | Discharge: 2014-09-20 | Disposition: A | Discharge status: Home / Self Care | Payer: Medicare Other | Source: Ambulatory Visit | Attending: Physician Assistant | Admitting: Physician Assistant

## 2014-09-20 VITALS — BP 122/60 | HR 70 | Temp 98.0°F | Resp 16 | Ht 63.0 in

## 2014-09-20 DIAGNOSIS — J984 Other disorders of lung: Secondary | ICD-10-CM | POA: Insufficient documentation

## 2014-09-20 DIAGNOSIS — M79672 Pain in left foot: Secondary | ICD-10-CM

## 2014-09-20 DIAGNOSIS — R0781 Pleurodynia: Secondary | ICD-10-CM | POA: Insufficient documentation

## 2014-09-20 DIAGNOSIS — W19XXXA Unspecified fall, initial encounter: Secondary | ICD-10-CM

## 2014-09-20 DIAGNOSIS — M85872 Other specified disorders of bone density and structure, left ankle and foot: Secondary | ICD-10-CM | POA: Insufficient documentation

## 2014-09-20 DIAGNOSIS — M810 Age-related osteoporosis without current pathological fracture: Secondary | ICD-10-CM | POA: Diagnosis not present

## 2014-09-20 DIAGNOSIS — M79671 Pain in right foot: Secondary | ICD-10-CM | POA: Diagnosis not present

## 2014-09-20 DIAGNOSIS — Y92009 Unspecified place in unspecified non-institutional (private) residence as the place of occurrence of the external cause: Principal | ICD-10-CM

## 2014-09-20 DIAGNOSIS — S99921A Unspecified injury of right foot, initial encounter: Secondary | ICD-10-CM | POA: Diagnosis not present

## 2014-09-20 DIAGNOSIS — M7732 Calcaneal spur, left foot: Secondary | ICD-10-CM | POA: Diagnosis not present

## 2014-09-20 DIAGNOSIS — M2012 Hallux valgus (acquired), left foot: Secondary | ICD-10-CM | POA: Diagnosis not present

## 2014-09-20 DIAGNOSIS — W010XXA Fall on same level from slipping, tripping and stumbling without subsequent striking against object, initial encounter: Secondary | ICD-10-CM | POA: Insufficient documentation

## 2014-09-20 DIAGNOSIS — R079 Chest pain, unspecified: Secondary | ICD-10-CM | POA: Diagnosis not present

## 2014-09-20 DIAGNOSIS — S9031XA Contusion of right foot, initial encounter: Secondary | ICD-10-CM | POA: Diagnosis not present

## 2014-09-20 DIAGNOSIS — S299XXA Unspecified injury of thorax, initial encounter: Secondary | ICD-10-CM | POA: Diagnosis not present

## 2014-09-20 DIAGNOSIS — M7989 Other specified soft tissue disorders: Secondary | ICD-10-CM | POA: Diagnosis not present

## 2014-09-20 DIAGNOSIS — S2242XA Multiple fractures of ribs, left side, initial encounter for closed fracture: Secondary | ICD-10-CM | POA: Diagnosis not present

## 2014-09-20 MED ORDER — TRAMADOL HCL 50 MG PO TABS: 50.0000 mg | ORAL_TABLET | Freq: Two times a day (BID) | ORAL | Status: DC

## 2014-09-20 NOTE — Progress Notes (Signed)
Subjective:    Patient ID: Kylie Sanchez, female    DOB: November 11, 1925, 80 y.o.   MRN: [id number]  Fall Incident onset: Last friday. The fall occurred while walking. She fell from an unknown height. She landed on hard floor. Blood loss: from toe. The pain is present in the left upper arm, right foot and left foot (Left ribs.). The pain is at a severity of 9/10. The pain is severe. Exacerbated by: Constant pain that hurts more with moving.  She states that the lefft rib pain hurts the most and certain positions and not taking deep breaths help. Pertinent negatives include no abdominal pain, bowel incontinence, fever, headaches, hearing loss, hematuria, loss of consciousness, nausea, numbness, tingling, visual change or vomiting. Treatments tried: Has Aleve at home but did not try it yet.  The treatment provided no relief.   Patient states physical therapist was there at her house to help sister and he decided to move rug.  She got up and hooked feet wearing bed room slippers on rug.  Hit left arm and side on coffee table or floor.  She denies LOC and hitting her head.  Denies Syncope.  She reports large bruise on left shoulder that goes from shoulder to elbow; and both feet have bruises in the toe area.    She states she is able to walk without complications.  She reports that she makes her meals for herself without difficulty.  She states she can do ADLs without complication.    She states she is taking Coumadin for Afib and was never put on Xarelto due to being too expensive.  Last PT/INR was 09/09/14 at coumadin clinic and was 2.3.  Patient has Pacemaker.    Patient has Osteoporosis- Last Dexa was on 04/26/14.  Pt is not on anything for osteoporosis. Patient's GFR on 09/09/14 was 39.74.   Patient is being seen by skin cancer doctor.  She is scheduled for surgery (possible Moh's procedure) for this Friday.  She is currently being treated with eye drops and will probably postpone treatment for right now due  to rib pain.  Review of Systems  Constitutional: Negative.  Negative for fever, chills and fatigue.  HENT: Negative.   Eyes: Negative.   Respiratory: Negative.   Cardiovascular: Negative.   Gastrointestinal: Negative.  Negative for nausea, vomiting, abdominal pain and bowel incontinence.  Endocrine: Negative.   Genitourinary: Negative.  Negative for hematuria.  Musculoskeletal: Negative.   Skin: Negative.  Negative for rash.  Neurological: Positive for dizziness. Negative for tingling, loss of consciousness, syncope, speech difficulty, weakness, numbness and headaches.  Psychiatric/Behavioral: Negative.    Past Medical History  Diagnosis Date  . Dizziness   . Cataract   . Paroxysmal atrial fibrillation   . Sick sinus syndrome   . Type II or unspecified type diabetes mellitus without mention of complication, not stated as uncontrolled   . Hyperlipidemia   . Hypertensive cardiovascular disease   . Retinopathy   . CKD (chronic kidney disease) stage 3, GFR 30-59 ml/min     due to DM   Current Outpatient Prescriptions on File Prior to Visit  Medication Sig Dispense Refill  . amLODipine (NORVASC) 10 MG tablet Take 1 tablet (10 mg total) daily 90 tablet 3  . b complex vitamins tablet Take 1 tablet by mouth daily.    . Coenzyme Q10 (CO Q-10) 200 MG CAPS Take 1 capsule by mouth daily.     Marland Kitchen glimepiride (AMARYL) 1 MG tablet TAKE ONE TABLET  BY MOUTH ONCE DAILY 90 tablet 0  . losartan (COZAAR) 100 MG tablet Take 1 tablet (100 mg total) by mouth daily. 30 tablet 0  . Magnesium 250 MG TABS Take 1 tablet by mouth daily.     . metFORMIN (GLUCOPHAGE) 500 MG tablet TAKE TWO TABLETS BY MOUTH TWICE DAILY 120 tablet 3  . metoprolol (TOPROL-XL) 200 MG 24 hr tablet TAKE 1 TABLET BY MOUTH EVERY DAY 90 tablet 0  . ONE TOUCH ULTRA TEST test strip USE ONE STRIP TO TEST BLOOD GLUCOSE TWICE DAILY 100 each 6  . pravastatin (PRAVACHOL) 40 MG tablet TAKE ONE TABLET BY MOUTH ONCE DAILY AT BEDTIME FOR   CHOLESTEROL 90 tablet 0  . warfarin (COUMADIN) 5 MG tablet Take 5mg  by mouth nightly as directed by CVRR clinic     No current facility-administered medications on file prior to visit.   No Known Allergies    BP 122/60 mmHg  Pulse 70  Temp(Src) 98 F (36.7 C) (Temporal)  Resp 16  Ht 5\' 3"  (1.6 m)  SpO2 98% Wt Readings from Last 3 Encounters:  09/09/14 152 lb 1.9 oz (69.001 kg)  08/22/14 150 lb (68.04 kg)  06/09/14 154 lb (69.854 kg)   Objective:   Physical Exam  Constitutional: She is oriented to person, place, and time. She appears well-developed and well-nourished. She does not have a sickly appearance. No distress.  HENT:  Head: Normocephalic. Head is with laceration. Head is without abrasion and without contusion.    Right Ear: Tympanic membrane, external ear and ear canal normal. No drainage, swelling or tenderness. Tympanic membrane is not injected, not perforated, not erythematous, not retracted and not bulging.  Left Ear: Tympanic membrane, external ear and ear canal normal. No drainage, swelling or tenderness. Tympanic membrane is not injected, not perforated, not erythematous, not retracted and not bulging.  Nose: Nose normal. Right sinus exhibits no maxillary sinus tenderness and no frontal sinus tenderness. Left sinus exhibits no maxillary sinus tenderness and no frontal sinus tenderness.  Mouth/Throat: Uvula is midline, oropharynx is clear and moist and mucous membranes are normal. Mucous membranes are not pale and not dry. No trismus in the jaw. No uvula swelling. No oropharyngeal exudate, posterior oropharyngeal edema, posterior oropharyngeal erythema or tonsillar abscesses.  Eyes: Conjunctivae and EOM are normal. Pupils are equal, round, and reactive to light. Right eye exhibits no discharge. Left eye exhibits discharge. No scleral icterus.  Neck: Trachea normal, normal range of motion and phonation normal. Neck supple. Normal carotid pulses present. No tracheal  tenderness present. Carotid bruit is not present. No tracheal deviation present.  Cardiovascular: Normal rate, regular rhythm, S1 normal, S2 normal, normal heart sounds, intact distal pulses and normal pulses.  Exam reveals no gallop, no distant heart sounds and no friction rub.   No murmur heard. Patient has pacemaker.  Pulmonary/Chest: Effort normal and breath sounds normal. No accessory muscle usage or stridor. No tachypnea and no bradypnea. No respiratory distress. She has no decreased breath sounds. She has no wheezes. She has no rhonchi. She has no rales. She exhibits no tenderness.  Patient has tenderness upon palpation of left lower rib cage.  No tenderness on right side.  Abdominal: Soft. Bowel sounds are normal. She exhibits no distension. There is no hepatosplenomegaly. There is no tenderness. There is no rebound and no guarding.  Musculoskeletal: Normal range of motion. She exhibits tenderness.       Left shoulder: She exhibits swelling. She exhibits normal range of motion,  no tenderness, no crepitus, no deformity, no pain and normal strength.       Right foot: There is tenderness and swelling. There is normal range of motion, no bony tenderness, normal capillary refill, no crepitus, no deformity and no laceration.       Left foot: There is tenderness and swelling. There is normal range of motion, no bony tenderness, normal capillary refill, no crepitus, no deformity and no laceration.       Feet:  Lymphadenopathy:  No LAD.  Neurological: She is alert and oriented to person, place, and time. She has normal strength and normal reflexes. No cranial nerve deficit or sensory deficit.  Patient in pain and in wheelchair.  Patient states she can walk without assistance.  Skin: Skin is warm, dry and intact. Ecchymosis noted. She is not diaphoretic. No cyanosis. Nails show no clubbing.  Ecchymosis from left shoulder to left elbow.  Ecchymosis in metatarsals and phalanges bilaterally.      Psychiatric: She has a normal mood and affect. Her speech is normal and behavior is normal. Judgment and thought content normal. Cognition and memory are normal.  Vitals reviewed.     Assessment & Plan:  1. Fall as cause of accidental injury in home as place of occurrence Will obtain x-rays of chest, rib cage and bilateral foot to R/O fractures due to patient having osteoporosis.  -Take the tramadol 50mg  every 12 hours.  Do not take any other NSAIDs (ibuprofen, aleve, naproxen). X-rays were ordered and patient is to go to [facility] hospital right away to get them done. - DG Ribs Unilateral Left; Future - DG Chest 2 View; Future - DG Foot Complete Left; Future - DG Foot Complete Right; Future - traMADol (ULTRAM) 50 MG tablet; Take 1 tablet (50 mg total) by mouth every 12 (twelve) hours. Take with food.  Dispense: 20 tablet; Refill: 0  2. Rib pain on left side Will obtain x-ray of chest and rib cage to R/O fractures due to pt having osteoporosis. - DG Ribs Unilateral Left; Future - DG Chest 2 View; Future - traMADol (ULTRAM) 50 MG tablet; Take 1 tablet (50 mg total) by mouth every 12 (twelve) hours. Take with food.  Dispense: 20 tablet; Refill: 0  3. Bilateral foot pain Will obtain x-ray of both feet to R/O fractures due to pt having osteoporosis. - DG Foot Complete Left; Future - DG Foot Complete Right; Future - traMADol (ULTRAM) 50 MG tablet; Take 1 tablet (50 mg total) by mouth every 12 (twelve) hours. Take with food.  Dispense: 20 tablet; Refill: 0  4. Osteoporosis Will obtain x-rays and will call patient with results.   - DG Ribs Unilateral Left; Future - DG Chest 2 View; Future - DG Foot Complete Left; Future - DG Foot Complete Right; Future  Please follow up in 3 weeks, but will update pt to follow up appointment once results are received.  Make sure you are drinking plenty of fluids to stay hydrated.   Discussed medication effects and SE's.  Pt agreed to treatment plan. -If  you are expierencing headaches, dizziness, shortness of breath, Chest pain, abdominal pain, nausea, vomiting, diarrhea, change in speech or vision, arm weakness on one side or uncontrollable bleeding.  GO to the ER immediately.  Jeannia Tatro, Stephani Police, PA-C 4:22 PM [facility]

## 2014-09-20 NOTE — Patient Instructions (Addendum)
-Take the tramadol 50mg  every 12 hours.  Do not take any other NSAIDs (ibuprofen, aleve, naproxen) -[facility]  Will call you with results. -See you back in 3 weeks for follow up. -If you are expierencing headaches, dizziness, shortness of breath, Chest pain, abdominal pain, nausea, vomiting, diarrhea, change in speech or vision, arm weakness on one side.  GO to the ER immediately.    Fall Prevention and Home Safety Falls cause injuries and can affect all age groups. It is possible to use preventive measures to significantly decrease the likelihood of falls. There are many simple measures which can make your home safer and prevent falls. OUTDOORS  Repair cracks and edges of walkways and driveways.  Remove high doorway thresholds.  Trim shrubbery on the main path into your home.  Have good outside lighting.  Clear walkways of tools, rocks, debris, and clutter.  Check that handrails are not broken and are securely fastened. Both sides of steps should have handrails.  Have leaves, snow, and ice cleared regularly.  Use sand or salt on walkways during winter months.  In the garage, clean up grease or oil spills. BATHROOM  Install night lights.  Install grab bars by the toilet and in the tub and shower.  Use non-skid mats or decals in the tub or shower.  Place a plastic non-slip stool in the shower to sit on, if needed.  Keep floors dry and clean up all water on the floor immediately.  Remove soap buildup in the tub or shower on a regular basis.  Secure bath mats with non-slip, double-sided rug tape.  Remove throw rugs and tripping hazards from the floors. BEDROOMS  Install night lights.  Make sure a bedside light is easy to reach.  Do not use oversized bedding.  Keep a telephone by your bedside.  Have a firm chair with side arms to use for getting dressed.  Remove throw rugs and tripping hazards from the floor. KITCHEN  Keep handles on pots  and pans turned toward the center of the stove. Use back burners when possible.  Clean up spills quickly and allow time for drying.  Avoid walking on wet floors.  Avoid hot utensils and knives.  Position shelves so they are not too high or low.  Place commonly used objects within easy reach.  If necessary, use a sturdy step stool with a grab bar when reaching.  Keep electrical cables out of the way.  Do not use floor polish or wax that makes floors slippery. If you must use wax, use non-skid floor wax.  Remove throw rugs and tripping hazards from the floor. STAIRWAYS  Never leave objects on stairs.  Place handrails on both sides of stairways and use them. Fix any loose handrails. Make sure handrails on both sides of the stairways are as long as the stairs.  Check carpeting to make sure it is firmly attached along stairs. Make repairs to worn or loose carpet promptly.  Avoid placing throw rugs at the top or bottom of stairways, or properly secure the rug with carpet tape to prevent slippage. Get rid of throw rugs, if possible.  Have an electrician put in a light switch at the top and bottom of the stairs. OTHER FALL PREVENTION TIPS  Wear low-heel or rubber-soled shoes that are supportive and fit well. Wear closed toe shoes.  When using a stepladder, make sure it is fully opened and both spreaders are firmly locked. Do not climb a closed  stepladder.  Add color or contrast paint or tape to grab bars and handrails in your home. Place contrasting color strips on first and last steps.  Learn and use mobility aids as needed. Install an electrical emergency response system.  Turn on lights to avoid dark areas. Replace light bulbs that burn out immediately. Get light switches that glow.  Arrange furniture to create clear pathways. Keep furniture in the same place.  Firmly attach carpet with non-skid or double-sided tape.  Eliminate uneven floor surfaces.  Select a carpet  pattern that does not visually hide the edge of steps.  Be aware of all pets. OTHER HOME SAFETY TIPS  Set the water temperature for 120 F (48.8 C).  Keep emergency numbers on or near the telephone.  Keep smoke detectors on every level of the home and near sleeping areas. Document Released: 10/18/2002 Document Revised: 04/28/2012 Document Reviewed: 01/17/2012 [facility] Patient Information 2015 [place], [place]. This information is not intended to replace advice given to you by your health care provider. Make sure you discuss any questions you have with your health care provider.

## 2014-10-03 ENCOUNTER — Telehealth: Payer: Self-pay | Admitting: Internal Medicine

## 2014-10-03 NOTE — Telephone Encounter (Signed)
PT SWITCHED HER RX'S TO [facility] ON [address], NEEDS RX CALLED FOR WARFARIN 5 MG FOR 90 DAYS

## 2014-10-07 ENCOUNTER — Other Ambulatory Visit: Payer: Self-pay | Admitting: *Deleted

## 2014-10-07 ENCOUNTER — Other Ambulatory Visit: Payer: Self-pay

## 2014-10-07 MED ORDER — WARFARIN SODIUM 5 MG PO TABS: 5.0000 mg | ORAL_TABLET | Freq: Once | ORAL | Status: DC

## 2014-10-07 NOTE — Telephone Encounter (Signed)
Open in error

## 2014-10-07 NOTE — Telephone Encounter (Signed)
Patient requesting a refill on her Coumadin. According to her last OV with Dr. Rayann Heman, she was going to start Xarelto. Decided against that and wants to stay on Coumadin. Advised she is due for a PT/INR. Will refill for 30 days only, since the Coumadin Clinic is closed.

## 2014-10-09 ENCOUNTER — Other Ambulatory Visit: Payer: Self-pay | Admitting: Internal Medicine

## 2014-10-10 ENCOUNTER — Other Ambulatory Visit: Payer: Self-pay | Admitting: *Deleted

## 2014-10-10 MED ORDER — LOSARTAN POTASSIUM 100 MG PO TABS: 100.0000 mg | ORAL_TABLET | Freq: Every day | ORAL | Status: DC

## 2014-10-11 ENCOUNTER — Telehealth: Payer: Self-pay | Admitting: Internal Medicine

## 2014-10-11 NOTE — Telephone Encounter (Signed)
Walk in Pt Form " Needs Labs" Gave To Kelly/km

## 2014-10-13 ENCOUNTER — Encounter: Payer: Self-pay | Admitting: Physician Assistant

## 2014-10-13 ENCOUNTER — Ambulatory Visit (INDEPENDENT_AMBULATORY_CARE_PROVIDER_SITE_OTHER): Payer: Medicare Other | Admitting: Physician Assistant

## 2014-10-13 ENCOUNTER — Ambulatory Visit: Payer: Self-pay | Admitting: Emergency Medicine

## 2014-10-13 VITALS — BP 138/56 | HR 72 | Temp 98.2°F | Resp 16 | Ht 63.0 in | Wt 154.0 lb

## 2014-10-13 DIAGNOSIS — S2232XD Fracture of one rib, left side, subsequent encounter for fracture with routine healing: Secondary | ICD-10-CM

## 2014-10-13 NOTE — Patient Instructions (Addendum)
-Please make sure you elevate your feet when sitting down and move your muscles everyfew minutes. -Please get compression stockings to help with the leg swelling.  -Keep doing the deep breath exercises to help prevent pneumonia. -If you develop a cough with sputum production or fever, chills, or sweats, chest pain, SOB, abdominal pain, then go to ED immediately.   Please keep your follow up appt with 11/08/14 with Dr. Melford Aase.  Rib Fracture A rib fracture is a break or crack in one of the bones of the ribs. The ribs are a group of long, curved bones that wrap around your chest and attach to your spine. They protect your lungs and other organs in the chest cavity. A broken or cracked rib is often painful, but most do not cause other problems. Most rib fractures heal on their own over time. However, rib fractures can be more serious if multiple ribs are broken or if broken ribs move out of place and push against other structures. CAUSES   A direct blow to the chest. For example, this could happen during contact sports, a car accident, or a fall against a hard object.  Repetitive movements with high force, such as pitching a baseball or having severe coughing spells. SYMPTOMS   Pain when you breathe in or cough.  Pain when someone presses on the injured area. DIAGNOSIS  Your caregiver will perform a physical exam. Various imaging tests may be ordered to confirm the diagnosis and to look for related injuries. These tests may include a chest X-ray, computed tomography (CT), magnetic resonance imaging (MRI), or a bone scan. TREATMENT  Rib fractures usually heal on their own in 1-3 months. The longer healing period is often associated with a continued cough or other aggravating activities. During the healing period, pain control is very important. Medication is usually given to control pain. Hospitalization or surgery may be needed for more severe injuries, such as those in which multiple ribs are  broken or the ribs have moved out of place.  HOME CARE INSTRUCTIONS   Avoid strenuous activity and any activities or movements that cause pain. Be careful during activities and avoid bumping the injured rib.  Gradually increase activity as directed by your caregiver.  Only take over-the-counter or prescription medications as directed by your caregiver. Do not take other medications without asking your caregiver first.  Apply ice to the injured area for the first 1-2 days after you have been treated or as directed by your caregiver. Applying ice helps to reduce inflammation and pain.  Put ice in a plastic bag.  Place a towel between your skin and the bag.   Leave the ice on for 15-20 minutes at a time, every 2 hours while you are awake.  Perform deep breathing as directed by your caregiver. This will help prevent pneumonia, which is a common complication of a broken rib. Your caregiver may instruct you to:  Take deep breaths several times a day.  Try to cough several times a day, holding a pillow against the injured area.  Use a device called an incentive spirometer to practice deep breathing several times a day.  Drink enough fluids to keep your urine clear or pale yellow. This will help you avoid constipation.   Do not wear a rib belt or binder. These restrict breathing, which can lead to pneumonia.  SEEK IMMEDIATE MEDICAL CARE IF:   You have a fever.   You have difficulty breathing or shortness of breath.  You develop a continual cough, or you cough up thick or bloody sputum.  You feel sick to your stomach (nausea), throw up (vomit), or have abdominal pain.   You have worsening pain not controlled with medications.  MAKE SURE YOU:  Understand these instructions.  Will watch your condition.  Will get help right away if you are not doing well or get worse. Document Released: 10/28/2005 Document Revised: 06/30/2013 Document Reviewed: 12/30/2012 [facility] Patient  Information 2015 [place], [place]. This information is not intended to replace advice given to you by your health care provider. Make sure you discuss any questions you have with your health care provider.

## 2014-10-13 NOTE — Progress Notes (Signed)
Subjective:    Patient ID: Kylie Sanchez, female    DOB: 1925/06/16, 80 y.o.   MRN: [id number]  HPI A 80yo Caucasian female presents to the office today due to a palpable "knot" located in he LUQ.  Her last visit was on 09/20/14 in which she presented due to a recent fall in her house.  Last visit information is located in this note under Fall.  Patient had x-ray of left rib cage which showed "Nondisplaced fractures of the left anterior seventh and eighth ribs".  Her chest x-ray was normal, except for stable bibasilar scarring.  Her left foot x-ray showed "osteopenia, calcaneal bone spur and mild hallux valgus but no acute findings".  Her right foot x-ray showed "generalized osteopenia, old fracture deformity of the distal fifth metatarsal and small plantar and dorsal calcaneal spurs, but no acute findings". Patient states she is feeling much better and her ribs are feeling better.  She states she has been doing the deep breathing exercises to help prevent pneumonia.  She states she noticed the "knot" about 1 week after the fall.  She states that it only hurts when you touch it.  Patient states she is due to have CT scan done January 11th and surgery January 17th for left eye laceration by the skin cancer doctor.  She states she hads appointment with Dr. Baird Cancer (eye doctor) after the first of the year due to eye issues and "prisms in left eye".  Patient denies cough, fever, chills, sweats, SOB, CP, abdominal pain, nausea, vomiting, diarrhea and constipation.  She has no other questions or concerns at this time.  LAST VISIT: Fall Incident onset: Last friday. The fall occurred while walking. She fell from an unknown height. She landed on hard floor. Blood loss: from toe. The pain is present in the left upper arm, right foot and left foot (Left ribs.). The pain is at a severity of 9/10. The pain is severe. Exacerbated by: Constant pain that hurts more with moving.  She states that the lefft rib pain hurts the  most and certain positions and not taking deep breaths help. Pertinent negatives include no abdominal pain, bowel incontinence, fever, headaches, hearing loss, hematuria, loss of consciousness, nausea, numbness, tingling, visual change or vomiting. Treatments tried: Has Aleve at home but did not try it yet.  The treatment provided no relief.  Abdominal Pain Pertinent negatives include no fever, headaches, hematuria, nausea or vomiting.  Patient states physical therapist was there at her house to help sister and he decided to move rug.  She got up and hooked feet wearing bed room slippers on rug.  Hit left arm and side on coffee table or floor.  She denies LOC and hitting her head.  Denies Syncope.  She reports large bruise on left shoulder that goes from shoulder to elbow; and both feet have bruises in the toe area.   She states she is able to walk without complications.  She reports that she makes her meals for herself without difficulty.  She states she can do ADLs without complication.   She states she is taking Coumadin for Afib and was never put on Xarelto due to being too expensive.  Last PT/INR was 09/09/14 at coumadin clinic and was 2.3.  Patient has Pacemaker.   Patient has Osteoporosis- Last Dexa was on 04/26/14.  Pt is not on anything for osteoporosis. Patient's GFR on 09/09/14 was 39.74.  Patient is being seen by skin cancer doctor.  She is scheduled for  surgery (possible Moh's procedure) for this Friday.  She is currently being treated with eye drops and will probably postpone treatment for right now due to rib pain.  Review of Systems  Constitutional: Negative.  Negative for fever, chills and fatigue.  HENT: Negative.   Eyes: Negative.   Respiratory: Negative.   Cardiovascular: Negative.   Gastrointestinal: Negative.  Negative for nausea, vomiting, abdominal pain and bowel incontinence.  Endocrine: Negative.   Genitourinary: Negative.  Negative for hematuria.  Musculoskeletal:  Negative.   Skin: Negative.  Negative for rash.       Bruises are healing, except for "knot developing in skin in LUQ".  Neurological: Negative.  Negative for dizziness, tingling, loss of consciousness, syncope, speech difficulty, weakness, numbness and headaches.  Hematological: Bruises/bleeds easily.       Bruises easily due to Coumadin.  Psychiatric/Behavioral: Negative.    Past Medical History  Diagnosis Date  . Dizziness   . Cataract   . Paroxysmal atrial fibrillation   . Sick sinus syndrome   . Type II or unspecified type diabetes mellitus without mention of complication, not stated as uncontrolled   . Hyperlipidemia   . Hypertensive cardiovascular disease   . Retinopathy   . CKD (chronic kidney disease) stage 3, GFR 30-59 ml/min     due to DM   Current Outpatient Prescriptions on File Prior to Visit  Medication Sig Dispense Refill  . amLODipine (NORVASC) 10 MG tablet Take 1 tablet (10 mg total) daily 90 tablet 3  . b complex vitamins tablet Take 1 tablet by mouth daily.    . Coenzyme Q10 (CO Q-10) 200 MG CAPS Take 1 capsule by mouth daily.     Marland Kitchen glimepiride (AMARYL) 1 MG tablet TAKE ONE TABLET BY MOUTH ONCE DAILY 90 tablet 0  . losartan (COZAAR) 100 MG tablet Take 1 tablet (100 mg total) by mouth daily. 30 tablet 11  . Magnesium 250 MG TABS Take 1 tablet by mouth daily.     . metFORMIN (GLUCOPHAGE) 500 MG tablet TAKE TWO TABLETS BY MOUTH TWICE DAILY 120 tablet 3  . metoprolol (TOPROL-XL) 200 MG 24 hr tablet TAKE 1 TABLET BY MOUTH EVERY DAY 90 tablet 0  . ONE TOUCH ULTRA TEST test strip USE ONE STRIP TO TEST BLOOD GLUCOSE TWICE DAILY 100 each 6  . pravastatin (PRAVACHOL) 40 MG tablet TAKE ONE TABLET BY MOUTH ONCE DAILY AT BEDTIME FOR  CHOLESTEROL 90 tablet 0  . traMADol (ULTRAM) 50 MG tablet Take 1 tablet (50 mg total) by mouth every 12 (twelve) hours. Take with food. 20 tablet 0  . warfarin (COUMADIN) 5 MG tablet Take 1 tablet (5 mg total) by mouth one time only at 6 PM. Take  5mg  by mouth nightly as directed by CVRR clinic 30 tablet 0   No current facility-administered medications on file prior to visit.   No Known Allergies    BP 138/56 mmHg  Pulse 72  Temp(Src) 98.2 F (36.8 C) (Temporal)  Resp 16  Ht 5\' 3"  (1.6 m)  Wt 154 lb (69.854 kg)  BMI 27.29 kg/m2  SpO2 95%  Wt Readings from Last 3 Encounters:  10/13/14 154 lb (69.854 kg)  09/09/14 152 lb 1.9 oz (69.001 kg)  08/22/14 150 lb (68.04 kg)   Objective:   Physical Exam  Constitutional: She is oriented to person, place, and time. She appears well-developed and well-nourished. She does not have a sickly appearance. No distress.  HENT:  Head: Normocephalic. Head is with laceration.  Head is without abrasion and without contusion.    Right Ear: Tympanic membrane, external ear and ear canal normal.  Left Ear: Tympanic membrane, external ear and ear canal normal.  Nose: Nose normal.  Mouth/Throat: Uvula is midline, oropharynx is clear and moist and mucous membranes are normal. Mucous membranes are not pale and not dry. No trismus in the jaw. No uvula swelling. No oropharyngeal exudate, posterior oropharyngeal edema, posterior oropharyngeal erythema or tonsillar abscesses.  Eyes: Conjunctivae and EOM are normal. Pupils are equal, round, and reactive to light. Right eye exhibits no discharge. Left eye exhibits no discharge. No scleral icterus.  Neck: Trachea normal, normal range of motion and phonation normal. Neck supple. Normal carotid pulses present. No tracheal tenderness present. Carotid bruit is not present. No tracheal deviation present.  Cardiovascular: Normal rate, regular rhythm, S1 normal, S2 normal, normal heart sounds, intact distal pulses and normal pulses.  Exam reveals no gallop, no distant heart sounds and no friction rub.   No murmur heard. Patient has pacemaker.  Pulmonary/Chest: Effort normal and breath sounds normal. No accessory muscle usage or stridor. No tachypnea and no bradypnea.  No respiratory distress. She has no decreased breath sounds. She has no wheezes. She has no rhonchi. She has no rales. She exhibits no tenderness.  Patient has tenderness upon palpation of left lower rib cage.  No tenderness on right side.  Abdominal: Soft. Bowel sounds are normal. She exhibits no distension and no mass. There is no hepatosplenomegaly. There is tenderness in the left upper quadrant. There is no rebound and no guarding. No hernia.  Palpable hard mobile circular mass about 1cm in diameter.  Ecchymosis seen in same area and is same diameter.  Musculoskeletal: Normal range of motion.       Left shoulder: She exhibits normal range of motion, no tenderness, no swelling, no crepitus, no deformity, no pain and normal strength.       Right foot: There is tenderness and swelling. There is normal range of motion, no bony tenderness, normal capillary refill, no crepitus, no deformity and no laceration.       Left foot: There is tenderness and swelling. There is normal range of motion, no bony tenderness, normal capillary refill, no crepitus, no deformity and no laceration.       Feet:  Neurological: She is alert and oriented to person, place, and time. She has normal strength. No sensory deficit. Gait normal.  Skin: Skin is warm, dry and intact. Ecchymosis noted. She is not diaphoretic. No cyanosis. Nails show no clubbing.  Ecchymosis from left shoulder to left elbow is healing and improved since last visit.  Ecchymosis in metatarsals and phalanges bilaterally is healing and almost gone since last visit.    Psychiatric: She has a normal mood and affect. Her speech is normal and behavior is normal. Judgment and thought content normal. Cognition and memory are normal.  Vitals reviewed.     Assessment & Plan:  1. Rib fractures, left, with routine healing, subsequent encounter -Please continue deep breathing exercises every 5-10 minutes to help prevent pneumonia. -Please make sure you elevate  your feet when sitting down and move your muscles every few minutes. -Please get compression stockings to help with the leg swelling and to help prevent blood clots. -Make sure you are drinking plenty of fluids to stay hydrated.    Discussed medication effects and SE's.  Pt agreed to treatment plan. -If you are expierencing headaches, dizziness, shortness of breath, Chest pain, abdominal  pain, nausea, vomiting, diarrhea, change in speech or vision, arm weakness on one side or uncontrollable bleeding.  GO to the ER immediately. Please keep your follow up appt on 11/08/14 with Dr. Melford Aase.  Caidence Kaseman, Stephani Police, PA-C 3:42 PM [facility]

## 2014-10-20 ENCOUNTER — Encounter (HOSPITAL_COMMUNITY): Payer: Self-pay | Admitting: Internal Medicine

## 2014-10-25 DIAGNOSIS — H50122 Monocular exotropia with A pattern, left eye: Secondary | ICD-10-CM | POA: Diagnosis not present

## 2014-10-25 DIAGNOSIS — Z961 Presence of intraocular lens: Secondary | ICD-10-CM | POA: Diagnosis not present

## 2014-10-26 DIAGNOSIS — C44101 Unspecified malignant neoplasm of skin of unspecified eyelid, including canthus: Secondary | ICD-10-CM | POA: Diagnosis not present

## 2014-10-26 DIAGNOSIS — C44119 Basal cell carcinoma of skin of left eyelid, including canthus: Secondary | ICD-10-CM | POA: Diagnosis not present

## 2014-10-26 DIAGNOSIS — R29810 Facial weakness: Secondary | ICD-10-CM | POA: Diagnosis not present

## 2014-10-26 DIAGNOSIS — R220 Localized swelling, mass and lump, head: Secondary | ICD-10-CM | POA: Diagnosis not present

## 2014-10-27 DIAGNOSIS — C4491 Basal cell carcinoma of skin, unspecified: Secondary | ICD-10-CM | POA: Diagnosis not present

## 2014-10-27 DIAGNOSIS — C44119 Basal cell carcinoma of skin of left eyelid, including canthus: Secondary | ICD-10-CM | POA: Diagnosis not present

## 2014-11-07 ENCOUNTER — Other Ambulatory Visit: Payer: Self-pay | Admitting: Internal Medicine

## 2014-11-08 ENCOUNTER — Ambulatory Visit (INDEPENDENT_AMBULATORY_CARE_PROVIDER_SITE_OTHER): Payer: Medicare Other | Admitting: Internal Medicine

## 2014-11-08 ENCOUNTER — Ambulatory Visit (INDEPENDENT_AMBULATORY_CARE_PROVIDER_SITE_OTHER): Payer: Medicare Other | Admitting: *Deleted

## 2014-11-08 ENCOUNTER — Encounter: Payer: Self-pay | Admitting: Internal Medicine

## 2014-11-08 ENCOUNTER — Other Ambulatory Visit: Payer: Self-pay | Admitting: Internal Medicine

## 2014-11-08 VITALS — BP 154/80 | HR 76 | Temp 97.9°F | Resp 18 | Ht 63.0 in | Wt 153.0 lb

## 2014-11-08 DIAGNOSIS — E119 Type 2 diabetes mellitus without complications: Secondary | ICD-10-CM | POA: Diagnosis not present

## 2014-11-08 DIAGNOSIS — E1121 Type 2 diabetes mellitus with diabetic nephropathy: Secondary | ICD-10-CM

## 2014-11-08 DIAGNOSIS — Z79899 Other long term (current) drug therapy: Secondary | ICD-10-CM | POA: Diagnosis not present

## 2014-11-08 DIAGNOSIS — I10 Essential (primary) hypertension: Secondary | ICD-10-CM

## 2014-11-08 DIAGNOSIS — E785 Hyperlipidemia, unspecified: Secondary | ICD-10-CM

## 2014-11-08 DIAGNOSIS — E559 Vitamin D deficiency, unspecified: Secondary | ICD-10-CM | POA: Insufficient documentation

## 2014-11-08 DIAGNOSIS — Z5181 Encounter for therapeutic drug level monitoring: Secondary | ICD-10-CM | POA: Diagnosis not present

## 2014-11-08 LAB — CBC WITH DIFFERENTIAL/PLATELET
Basophils Absolute: 0.1 10*3/uL (ref 0.0–0.1)
Basophils Relative: 1 % (ref 0–1)
EOS ABS: 0.3 10*3/uL (ref 0.0–0.7)
Eosinophils Relative: 6 % — ABNORMAL HIGH (ref 0–5)
HEMATOCRIT: 33.8 % — AB (ref 36.0–46.0)
HEMOGLOBIN: 11.4 g/dL — AB (ref 12.0–15.0)
LYMPHS ABS: 1.8 10*3/uL (ref 0.7–4.0)
Lymphocytes Relative: 32 % (ref 12–46)
MCH: 30.1 pg (ref 26.0–34.0)
MCHC: 33.7 g/dL (ref 30.0–36.0)
MCV: 89.2 fL (ref 78.0–100.0)
MONOS PCT: 6 % (ref 3–12)
MPV: 8.9 fL (ref 8.6–12.4)
Monocytes Absolute: 0.3 10*3/uL (ref 0.1–1.0)
NEUTROS ABS: 3.1 10*3/uL (ref 1.7–7.7)
Neutrophils Relative %: 55 % (ref 43–77)
Platelets: 290 10*3/uL (ref 150–400)
RBC: 3.79 MIL/uL — AB (ref 3.87–5.11)
RDW: 13.5 % (ref 11.5–15.5)
WBC: 5.6 10*3/uL (ref 4.0–10.5)

## 2014-11-08 LAB — HEMOGLOBIN A1C
HEMOGLOBIN A1C: 6.7 % — AB (ref <5.7)
MEAN PLASMA GLUCOSE: 146 mg/dL — AB (ref <117)

## 2014-11-08 LAB — POCT INR: INR: 1.4

## 2014-11-08 MED ORDER — WARFARIN SODIUM 5 MG PO TABS: 5.0000 mg | ORAL_TABLET | Freq: Once | ORAL | Status: DC

## 2014-11-08 NOTE — Patient Instructions (Signed)

## 2014-11-08 NOTE — Progress Notes (Signed)
Patient ID: Kylie Sanchez, female   DOB: 16-Jul-1925, 80 y.o.   MRN: [id number]   This very nice 80 y.o.SWF presents for 3 month follow up with Hypertension, Hyperlipidemia, Pre-Diabetes and Vitamin D Deficiency.    Patient had removal of a skin cancer from her nasal bridge years ago and currently is being evaluated by [facility] occuloplastics for an invasive skin cancer into the left orbit and treatment plan apparently contemplates left enucleation.    Patient is treated for HTN & BP has been controlled at home. Patient has hx/o HTHD and pAfib predating at least from 2011. In June 2013 she had a permanent pacemaker placed for SSS and has been on coumadin since. Today's BP: (!) 154/80 mmHg. Patient has had no complaints of any cardiac type chest pain, palpitations, dyspnea/orthopnea/PND, dizziness, claudication, or dependent edema.   Hyperlipidemia is controlled with diet & meds. Patient denies myalgias or other med SE's. Last Lipids were Total  Chol 143; HDL 61; LDL  54; Trig 141 on 02/09/2014.   Also, the patient has history of T2_NIDDM w/CKD3 (GFR 40 ml/min) and has had no symptoms of reactive hypoglycemia, diabetic polys, paresthesias or visual blurring.  She very infrequently monitors CBG's. Last A1c was  6.8% on  02/09/2014.   Further, the patient also has history of Vitamin D Deficiency and supplements vitamin D without any suspected side-effects. Last vitamin D was 56 in Sept 2014.   Medication List   b complex vitamins tablet  Take 1 tablet by mouth daily.     Co Q-10 200 MG Caps  Take 1 capsule by mouth daily.     glimepiride 1 MG tablet  Commonly known as:  AMARYL  TAKE ONE TABLET BY MOUTH ONCE DAILY     losartan 100 MG tablet  Commonly known as:  COZAAR  Take 1 tablet (100 mg total) by mouth daily.     Magnesium 250 MG Tabs  Take 1 tablet by mouth daily.     metFORMIN 500 MG tablet  Commonly known as:  GLUCOPHAGE  TAKE TWO TABLETS BY MOUTH TWICE DAILY     metoprolol 200 MG 24 hr  tablet  Commonly known as:  TOPROL-XL  TAKE 1 TABLET BY MOUTH EVERY DAY     ONE TOUCH ULTRA TEST test strip  Generic drug:  glucose blood  USE ONE STRIP TO TEST BLOOD GLUCOSE TWICE DAILY     pravastatin 40 MG tablet  Commonly known as:  PRAVACHOL  TAKE ONE TABLET BY MOUTH ONCE DAILY AT BEDTIME FOR  CHOLESTEROL     traMADol 50 MG tablet  Commonly known as:  ULTRAM  Take 1 tablet (50 mg total) by mouth every 12 (twelve) hours. Take with food.     warfarin 5 MG tablet  Commonly known as:  COUMADIN  Take 1 tablet (5 mg total) by mouth one time only at 6 PM. Take as directed by [facility].       No Known Allergies  PMHx:   Past Medical History  Diagnosis Date  . Dizziness   . Cataract   . Paroxysmal atrial fibrillation   . Sick sinus syndrome   . Type II or unspecified type diabetes mellitus without mention of complication, not stated as uncontrolled   . Hyperlipidemia   . Hypertensive cardiovascular disease   . Retinopathy   . CKD (chronic kidney disease) stage 3, GFR 30-59 ml/min     due to DM    There is no immunization history on  file for this patient. Past Surgical History  Procedure Laterality Date  . Pacemaker insertion  04/20/12    MDT Adapta L implanted by Dr Rayann Heman  . Dental surgery    . Permanent pacemaker insertion N/A 04/20/2012    Procedure: PERMANENT PACEMAKER INSERTION;  Surgeon: Thompson Grayer, MD;  Location: [facility] CATH LAB;  Service: Cardiovascular;  Laterality: N/A;   FHx:    Reviewed / unchanged  SHx:    Reviewed / unchanged  Systems Review:  Constitutional: Denies fever, chills, wt changes, headaches, insomnia, fatigue, night sweats, change in appetite. Eyes: Denies redness, blurred vision, diplopia, discharge, itchy, watery eyes.  ENT: Denies discharge, congestion, post nasal drip, epistaxis, sore throat, earache, hearing loss, dental pain, tinnitus, vertigo, sinus pain, snoring.  CV: Denies chest pain, palpitations, irregular heartbeat, syncope,  dyspnea, diaphoresis, orthopnea, PND, claudication or edema. Respiratory: denies cough, dyspnea, DOE, pleurisy, hoarseness, laryngitis, wheezing.  Gastrointestinal: Denies dysphagia, odynophagia, heartburn, reflux, water brash, abdominal pain or cramps, nausea, vomiting, bloating, diarrhea, constipation, hematemesis, melena, hematochezia  or hemorrhoids. Genitourinary: Denies dysuria, frequency, urgency, nocturia, hesitancy, discharge, hematuria or flank pain. Musculoskeletal: Denies arthralgias, myalgias, stiffness, jt. swelling, pain, limping or strain/sprain.  Skin: Denies pruritus, rash, hives, warts, acne, eczema. Neuro: No weakness, tremor, incoordination, spasms, paresthesia or pain. Psychiatric: Denies confusion, memory loss or sensory loss. Endo: Denies change in weight, skin or hair change.  Heme/Lymph: No excessive bleeding, bruising or enlarged lymph nodes.  Physical Exam  BP 154/80   Pulse 76  Temp 97.9 F   Resp 18  Ht 5\' 3"    Wt 153 lb    BMI 27.11  Appears well nourished and in no distress, affect is somewhat flat and depressed.  Eyes: PERRLA, EOMs, conjunctiva no swelling or erythema. Obvious ulcerative nodular skin lesion along the medial commissure of the left eye. Sinuses: No frontal/maxillary tenderness ENT/Mouth: EAC's clear, TM's nl w/o erythema, bulging. Nares clear w/o erythema, swelling, exudates. Oropharynx clear without erythema or exudates. Oral hygiene is good. Tongue normal, non obstructing. Hearing intact.  Neck: Supple. Thyroid nl. Car 2+/2+ without bruits, nodes or JVD. Chest: Respirations nl with BS clear & equal w/o rales, rhonchi, wheezing or stridor.  Cor: Heart sounds normal w/ regular rate and rhythm without sig. murmurs, gallops, clicks, or rubs. Peripheral pulses normal and equal  without edema.  Abdomen: Soft & bowel sounds normal. Non-tender w/o guarding, rebound, hernias, masses, or organomegaly.  Lymphatics: Unremarkable.  Musculoskeletal:  Full ROM all peripheral extremities, joint stability, 5/5 strength, and normal gait.  Skin: Warm, dry without exposed rashes, lesions or ecchymosis apparent.  Neuro: Cranial nerves intact, reflexes equal bilaterally. Sensory-motor testing grossly intact. Tendon reflexes grossly intact.  Pysch: Alert & oriented x 3.  Insight and judgement nl & appropriate. No ideations.  Assessment and Plan:  1. Essential hypertension  - TSH  2. Hyperlipidemia  - Lipid panel  3. T2_NIDDM  - Hemoglobin A1c - Insulin, fasting  4. Vitamin D deficiency  - Vit D  25 hydroxy (rtn osteoporosis monitoring)  5. Medication management - CBC with Differential - BASIC METABOLIC PANEL WITH GFR - Hepatic function panel - Magnesium  6. Invasive skin Cancer Left Orbit - Treatment plan being worked out   Recommended regular exercise, BP monitoring, weight control, and discussed med and SE's. Recommended labs to assess and monitor clinical status. Further disposition pending results of labs.

## 2014-11-09 LAB — INSULIN, FASTING: INSULIN FASTING, SERUM: 21.7 u[IU]/mL — AB (ref 2.0–19.6)

## 2014-11-09 LAB — LIPID PANEL
CHOL/HDL RATIO: 2.5 ratio
Cholesterol: 153 mg/dL (ref 0–200)
HDL: 61 mg/dL (ref >39)
LDL CALC: 56 mg/dL (ref 0–99)
Triglycerides: 182 mg/dL — ABNORMAL HIGH (ref <150)
VLDL: 36 mg/dL (ref 0–40)

## 2014-11-09 LAB — VITAMIN D 25 HYDROXY (VIT D DEFICIENCY, FRACTURES): Vit D, 25-Hydroxy: 44 ng/mL (ref 30–100)

## 2014-11-09 LAB — HEPATIC FUNCTION PANEL
ALT: 14 U/L (ref 0–35)
AST: 15 U/L (ref 0–37)
Albumin: 3.9 g/dL (ref 3.5–5.2)
Alkaline Phosphatase: 66 U/L (ref 39–117)
BILIRUBIN TOTAL: 0.3 mg/dL (ref 0.2–1.2)
Bilirubin, Direct: 0.1 mg/dL (ref 0.0–0.3)
Indirect Bilirubin: 0.2 mg/dL (ref 0.2–1.2)
Total Protein: 6.7 g/dL (ref 6.0–8.3)

## 2014-11-09 LAB — BASIC METABOLIC PANEL WITH GFR
BUN: 28 mg/dL — ABNORMAL HIGH (ref 6–23)
CO2: 26 meq/L (ref 19–32)
Calcium: 9.5 mg/dL (ref 8.4–10.5)
Chloride: 104 mEq/L (ref 96–112)
Creat: 1.32 mg/dL — ABNORMAL HIGH (ref 0.50–1.10)
GFR, Est African American: 42 mL/min — ABNORMAL LOW
GFR, Est Non African American: 36 mL/min — ABNORMAL LOW
GLUCOSE: 133 mg/dL — AB (ref 70–99)
POTASSIUM: 4.8 meq/L (ref 3.5–5.3)
Sodium: 137 mEq/L (ref 135–145)

## 2014-11-09 LAB — TSH: TSH: 1.043 u[IU]/mL (ref 0.350–4.500)

## 2014-11-09 LAB — MAGNESIUM: MAGNESIUM: 1.9 mg/dL (ref 1.5–2.5)

## 2014-11-21 DIAGNOSIS — E11349 Type 2 diabetes mellitus with severe nonproliferative diabetic retinopathy without macular edema: Secondary | ICD-10-CM | POA: Diagnosis not present

## 2014-11-21 DIAGNOSIS — H3531 Nonexudative age-related macular degeneration: Secondary | ICD-10-CM | POA: Diagnosis not present

## 2014-11-21 DIAGNOSIS — H3532 Exudative age-related macular degeneration: Secondary | ICD-10-CM | POA: Diagnosis not present

## 2014-11-22 ENCOUNTER — Ambulatory Visit (INDEPENDENT_AMBULATORY_CARE_PROVIDER_SITE_OTHER): Payer: Medicare Other | Admitting: *Deleted

## 2014-11-22 DIAGNOSIS — Z5181 Encounter for therapeutic drug level monitoring: Secondary | ICD-10-CM | POA: Diagnosis not present

## 2014-11-22 LAB — POCT INR: INR: 1.6

## 2014-11-23 ENCOUNTER — Telehealth: Payer: Self-pay | Admitting: Internal Medicine

## 2014-11-23 NOTE — Telephone Encounter (Signed)
F/U    Request for surgical clearance:  1. What type of surgery is being performed? Left  Eye surgery   2. When is this surgery scheduled? No definite but estimated date of 11-29-14  3. Are there any medications that need to be held prior to surgery and how long? Hold Coumadin. Pt needs to know when to stop   4. Name of physician performing surgery? Dr. Mardee Postin   5. What is your office phone and fax number? [phone]   Dr. Mardee Postin of [facility] calling and would like pt to be contacted with this info.

## 2014-11-23 NOTE — Telephone Encounter (Signed)
Request for surgical clearance:  1. What type of surgery is being performed? Eye Surgery  2. When is this surgery scheduled? 1.19.16   3. Are there any medications that need to be held prior to surgery and how long?Coumadin..need to know when she need to stop this medication  4. Name of physician performing surgery? Dr Roosvelt Harps  5. What is your office phone and fax number? Pt don't know number 6.

## 2014-11-24 DIAGNOSIS — E785 Hyperlipidemia, unspecified: Secondary | ICD-10-CM

## 2014-11-24 DIAGNOSIS — E1169 Type 2 diabetes mellitus with other specified complication: Secondary | ICD-10-CM | POA: Insufficient documentation

## 2014-11-24 NOTE — Telephone Encounter (Signed)
Pt came into office today inquiring about holding Coumadin for upcoming eye surgery on 11/29/14 to remove a cancerous tumor.  Please advise.  Thanks

## 2014-11-24 NOTE — Telephone Encounter (Signed)
Gay Filler, please use protocol for bridging.

## 2014-11-25 DIAGNOSIS — Z01818 Encounter for other preprocedural examination: Secondary | ICD-10-CM | POA: Diagnosis not present

## 2014-11-25 DIAGNOSIS — I495 Sick sinus syndrome: Secondary | ICD-10-CM | POA: Diagnosis not present

## 2014-11-25 DIAGNOSIS — C44119 Basal cell carcinoma of skin of left eyelid, including canthus: Secondary | ICD-10-CM | POA: Diagnosis not present

## 2014-11-25 DIAGNOSIS — I480 Paroxysmal atrial fibrillation: Secondary | ICD-10-CM | POA: Diagnosis not present

## 2014-11-25 DIAGNOSIS — R221 Localized swelling, mass and lump, neck: Secondary | ICD-10-CM | POA: Diagnosis not present

## 2014-11-25 DIAGNOSIS — C760 Malignant neoplasm of head, face and neck: Secondary | ICD-10-CM | POA: Diagnosis not present

## 2014-11-25 DIAGNOSIS — R220 Localized swelling, mass and lump, head: Secondary | ICD-10-CM | POA: Diagnosis not present

## 2014-11-25 DIAGNOSIS — R918 Other nonspecific abnormal finding of lung field: Secondary | ICD-10-CM | POA: Diagnosis not present

## 2014-11-25 DIAGNOSIS — Z7901 Long term (current) use of anticoagulants: Secondary | ICD-10-CM | POA: Diagnosis not present

## 2014-11-25 DIAGNOSIS — Z5181 Encounter for therapeutic drug level monitoring: Secondary | ICD-10-CM | POA: Diagnosis not present

## 2014-11-25 NOTE — Telephone Encounter (Signed)
Spoke with eye doctor's office pt came in today for her pre-op appt, she didn't take he coumadin last night, thus she is holding her coumadin and will have surgery on 11/29/14. Faxed clearance to them

## 2014-11-25 NOTE — Telephone Encounter (Signed)
Pt has a CHADS score of 3.  Per protocol, okay to hold Coumadin with no Lovenox bridge.

## 2014-11-29 DIAGNOSIS — I4891 Unspecified atrial fibrillation: Secondary | ICD-10-CM | POA: Diagnosis not present

## 2014-11-29 DIAGNOSIS — M6281 Muscle weakness (generalized): Secondary | ICD-10-CM | POA: Diagnosis not present

## 2014-11-29 DIAGNOSIS — D62 Acute posthemorrhagic anemia: Secondary | ICD-10-CM | POA: Diagnosis not present

## 2014-11-29 DIAGNOSIS — C44119 Basal cell carcinoma of skin of left eyelid, including canthus: Secondary | ICD-10-CM | POA: Diagnosis not present

## 2014-11-29 DIAGNOSIS — I110 Hypertensive heart disease with heart failure: Secondary | ICD-10-CM | POA: Diagnosis present

## 2014-11-29 DIAGNOSIS — Z135 Encounter for screening for eye and ear disorders: Secondary | ICD-10-CM | POA: Diagnosis not present

## 2014-11-29 DIAGNOSIS — Z950 Presence of cardiac pacemaker: Secondary | ICD-10-CM | POA: Diagnosis not present

## 2014-11-29 DIAGNOSIS — E11319 Type 2 diabetes mellitus with unspecified diabetic retinopathy without macular edema: Secondary | ICD-10-CM | POA: Diagnosis present

## 2014-11-29 DIAGNOSIS — M6250 Muscle wasting and atrophy, not elsewhere classified, unspecified site: Secondary | ICD-10-CM | POA: Diagnosis not present

## 2014-11-29 DIAGNOSIS — I509 Heart failure, unspecified: Secondary | ICD-10-CM | POA: Diagnosis present

## 2014-11-29 DIAGNOSIS — I058 Other rheumatic mitral valve diseases: Secondary | ICD-10-CM | POA: Diagnosis not present

## 2014-11-29 DIAGNOSIS — C6992 Malignant neoplasm of unspecified site of left eye: Secondary | ICD-10-CM | POA: Diagnosis not present

## 2014-11-29 DIAGNOSIS — C6962 Malignant neoplasm of left orbit: Secondary | ICD-10-CM | POA: Diagnosis not present

## 2014-11-29 DIAGNOSIS — Z85828 Personal history of other malignant neoplasm of skin: Secondary | ICD-10-CM | POA: Diagnosis not present

## 2014-11-29 DIAGNOSIS — C4491 Basal cell carcinoma of skin, unspecified: Secondary | ICD-10-CM | POA: Diagnosis not present

## 2014-11-29 DIAGNOSIS — Z483 Aftercare following surgery for neoplasm: Secondary | ICD-10-CM | POA: Diagnosis not present

## 2014-11-29 DIAGNOSIS — I480 Paroxysmal atrial fibrillation: Secondary | ICD-10-CM | POA: Diagnosis present

## 2014-11-29 DIAGNOSIS — C44199 Other specified malignant neoplasm of skin of left eyelid, including canthus: Secondary | ICD-10-CM | POA: Diagnosis not present

## 2014-11-29 DIAGNOSIS — C760 Malignant neoplasm of head, face and neck: Secondary | ICD-10-CM | POA: Diagnosis not present

## 2014-11-29 DIAGNOSIS — I10 Essential (primary) hypertension: Secondary | ICD-10-CM | POA: Diagnosis not present

## 2014-11-29 DIAGNOSIS — I517 Cardiomegaly: Secondary | ICD-10-CM | POA: Diagnosis not present

## 2014-11-29 DIAGNOSIS — E119 Type 2 diabetes mellitus without complications: Secondary | ICD-10-CM | POA: Diagnosis not present

## 2014-11-29 DIAGNOSIS — R600 Localized edema: Secondary | ICD-10-CM | POA: Diagnosis not present

## 2014-11-29 DIAGNOSIS — I272 Other secondary pulmonary hypertension: Secondary | ICD-10-CM | POA: Diagnosis not present

## 2014-11-29 DIAGNOSIS — I700 Atherosclerosis of aorta: Secondary | ICD-10-CM | POA: Diagnosis not present

## 2014-11-29 DIAGNOSIS — E785 Hyperlipidemia, unspecified: Secondary | ICD-10-CM | POA: Diagnosis present

## 2014-11-29 DIAGNOSIS — R2689 Other abnormalities of gait and mobility: Secondary | ICD-10-CM | POA: Diagnosis not present

## 2014-11-29 DIAGNOSIS — R040 Epistaxis: Secondary | ICD-10-CM | POA: Diagnosis not present

## 2014-11-29 DIAGNOSIS — Z79899 Other long term (current) drug therapy: Secondary | ICD-10-CM | POA: Diagnosis not present

## 2014-11-29 DIAGNOSIS — I495 Sick sinus syndrome: Secondary | ICD-10-CM | POA: Diagnosis present

## 2014-12-06 DIAGNOSIS — M6250 Muscle wasting and atrophy, not elsewhere classified, unspecified site: Secondary | ICD-10-CM | POA: Diagnosis not present

## 2014-12-06 DIAGNOSIS — I480 Paroxysmal atrial fibrillation: Secondary | ICD-10-CM | POA: Diagnosis not present

## 2014-12-06 DIAGNOSIS — F4321 Adjustment disorder with depressed mood: Secondary | ICD-10-CM | POA: Diagnosis not present

## 2014-12-06 DIAGNOSIS — I10 Essential (primary) hypertension: Secondary | ICD-10-CM | POA: Diagnosis not present

## 2014-12-06 DIAGNOSIS — C6962 Malignant neoplasm of left orbit: Secondary | ICD-10-CM | POA: Diagnosis not present

## 2014-12-06 DIAGNOSIS — Z7901 Long term (current) use of anticoagulants: Secondary | ICD-10-CM | POA: Diagnosis not present

## 2014-12-06 DIAGNOSIS — C760 Malignant neoplasm of head, face and neck: Secondary | ICD-10-CM | POA: Diagnosis not present

## 2014-12-06 DIAGNOSIS — I509 Heart failure, unspecified: Secondary | ICD-10-CM | POA: Diagnosis not present

## 2014-12-06 DIAGNOSIS — M6281 Muscle weakness (generalized): Secondary | ICD-10-CM | POA: Diagnosis not present

## 2014-12-06 DIAGNOSIS — C4491 Basal cell carcinoma of skin, unspecified: Secondary | ICD-10-CM | POA: Diagnosis not present

## 2014-12-06 DIAGNOSIS — R2689 Other abnormalities of gait and mobility: Secondary | ICD-10-CM | POA: Diagnosis not present

## 2014-12-06 DIAGNOSIS — J310 Chronic rhinitis: Secondary | ICD-10-CM | POA: Diagnosis not present

## 2014-12-06 DIAGNOSIS — E119 Type 2 diabetes mellitus without complications: Secondary | ICD-10-CM | POA: Diagnosis not present

## 2014-12-06 DIAGNOSIS — I5032 Chronic diastolic (congestive) heart failure: Secondary | ICD-10-CM | POA: Diagnosis not present

## 2014-12-06 DIAGNOSIS — C6992 Malignant neoplasm of unspecified site of left eye: Secondary | ICD-10-CM | POA: Diagnosis not present

## 2014-12-06 DIAGNOSIS — I4891 Unspecified atrial fibrillation: Secondary | ICD-10-CM | POA: Diagnosis not present

## 2014-12-06 DIAGNOSIS — G4733 Obstructive sleep apnea (adult) (pediatric): Secondary | ICD-10-CM | POA: Diagnosis not present

## 2014-12-06 DIAGNOSIS — I495 Sick sinus syndrome: Secondary | ICD-10-CM | POA: Diagnosis not present

## 2014-12-06 DIAGNOSIS — E139 Other specified diabetes mellitus without complications: Secondary | ICD-10-CM | POA: Diagnosis not present

## 2014-12-06 DIAGNOSIS — Z483 Aftercare following surgery for neoplasm: Secondary | ICD-10-CM | POA: Diagnosis not present

## 2014-12-07 DIAGNOSIS — C4491 Basal cell carcinoma of skin, unspecified: Secondary | ICD-10-CM | POA: Diagnosis not present

## 2014-12-07 DIAGNOSIS — E119 Type 2 diabetes mellitus without complications: Secondary | ICD-10-CM | POA: Diagnosis not present

## 2014-12-07 DIAGNOSIS — I10 Essential (primary) hypertension: Secondary | ICD-10-CM | POA: Diagnosis not present

## 2014-12-07 DIAGNOSIS — C760 Malignant neoplasm of head, face and neck: Secondary | ICD-10-CM | POA: Diagnosis not present

## 2014-12-08 DIAGNOSIS — I5032 Chronic diastolic (congestive) heart failure: Secondary | ICD-10-CM | POA: Diagnosis not present

## 2014-12-08 DIAGNOSIS — I4891 Unspecified atrial fibrillation: Secondary | ICD-10-CM | POA: Diagnosis not present

## 2014-12-08 DIAGNOSIS — G4733 Obstructive sleep apnea (adult) (pediatric): Secondary | ICD-10-CM | POA: Diagnosis not present

## 2014-12-08 DIAGNOSIS — E139 Other specified diabetes mellitus without complications: Secondary | ICD-10-CM | POA: Diagnosis not present

## 2014-12-08 DIAGNOSIS — I495 Sick sinus syndrome: Secondary | ICD-10-CM | POA: Diagnosis not present

## 2014-12-08 DIAGNOSIS — I10 Essential (primary) hypertension: Secondary | ICD-10-CM | POA: Diagnosis not present

## 2014-12-13 ENCOUNTER — Encounter: Payer: Medicare Other | Admitting: *Deleted

## 2014-12-13 ENCOUNTER — Telehealth: Payer: Self-pay | Admitting: Cardiology

## 2014-12-13 DIAGNOSIS — C4491 Basal cell carcinoma of skin, unspecified: Secondary | ICD-10-CM | POA: Diagnosis not present

## 2014-12-13 DIAGNOSIS — Z7901 Long term (current) use of anticoagulants: Secondary | ICD-10-CM | POA: Diagnosis not present

## 2014-12-13 DIAGNOSIS — C6992 Malignant neoplasm of unspecified site of left eye: Secondary | ICD-10-CM | POA: Diagnosis not present

## 2014-12-13 NOTE — Telephone Encounter (Signed)
Attempted to confirm remote transmission with pt. No answer and was unable to leave a message.   

## 2014-12-14 ENCOUNTER — Encounter: Payer: Self-pay | Admitting: Cardiology

## 2014-12-14 DIAGNOSIS — F4321 Adjustment disorder with depressed mood: Secondary | ICD-10-CM | POA: Diagnosis not present

## 2014-12-14 DIAGNOSIS — C4491 Basal cell carcinoma of skin, unspecified: Secondary | ICD-10-CM | POA: Diagnosis not present

## 2014-12-14 DIAGNOSIS — Z7901 Long term (current) use of anticoagulants: Secondary | ICD-10-CM | POA: Diagnosis not present

## 2014-12-14 DIAGNOSIS — C760 Malignant neoplasm of head, face and neck: Secondary | ICD-10-CM | POA: Diagnosis not present

## 2014-12-16 DIAGNOSIS — J310 Chronic rhinitis: Secondary | ICD-10-CM | POA: Diagnosis not present

## 2014-12-16 DIAGNOSIS — Z7901 Long term (current) use of anticoagulants: Secondary | ICD-10-CM | POA: Diagnosis not present

## 2014-12-16 DIAGNOSIS — C4491 Basal cell carcinoma of skin, unspecified: Secondary | ICD-10-CM | POA: Diagnosis not present

## 2014-12-16 DIAGNOSIS — C760 Malignant neoplasm of head, face and neck: Secondary | ICD-10-CM | POA: Diagnosis not present

## 2014-12-16 DIAGNOSIS — C6992 Malignant neoplasm of unspecified site of left eye: Secondary | ICD-10-CM | POA: Diagnosis not present

## 2014-12-19 DIAGNOSIS — C4491 Basal cell carcinoma of skin, unspecified: Secondary | ICD-10-CM | POA: Diagnosis not present

## 2014-12-19 DIAGNOSIS — C760 Malignant neoplasm of head, face and neck: Secondary | ICD-10-CM | POA: Diagnosis not present

## 2014-12-19 DIAGNOSIS — Z7901 Long term (current) use of anticoagulants: Secondary | ICD-10-CM | POA: Diagnosis not present

## 2014-12-23 DIAGNOSIS — Z7901 Long term (current) use of anticoagulants: Secondary | ICD-10-CM | POA: Diagnosis not present

## 2014-12-23 DIAGNOSIS — C760 Malignant neoplasm of head, face and neck: Secondary | ICD-10-CM | POA: Diagnosis not present

## 2014-12-23 DIAGNOSIS — C4491 Basal cell carcinoma of skin, unspecified: Secondary | ICD-10-CM | POA: Diagnosis not present

## 2014-12-27 DIAGNOSIS — Z483 Aftercare following surgery for neoplasm: Secondary | ICD-10-CM | POA: Diagnosis not present

## 2014-12-27 DIAGNOSIS — Z9001 Acquired absence of eye: Secondary | ICD-10-CM | POA: Diagnosis not present

## 2014-12-27 DIAGNOSIS — E119 Type 2 diabetes mellitus without complications: Secondary | ICD-10-CM | POA: Diagnosis not present

## 2014-12-27 DIAGNOSIS — R2689 Other abnormalities of gait and mobility: Secondary | ICD-10-CM

## 2014-12-27 DIAGNOSIS — Z5181 Encounter for therapeutic drug level monitoring: Secondary | ICD-10-CM | POA: Diagnosis not present

## 2014-12-27 DIAGNOSIS — I509 Heart failure, unspecified: Secondary | ICD-10-CM | POA: Diagnosis not present

## 2014-12-27 DIAGNOSIS — Z7901 Long term (current) use of anticoagulants: Secondary | ICD-10-CM | POA: Diagnosis not present

## 2014-12-27 DIAGNOSIS — C6962 Malignant neoplasm of left orbit: Secondary | ICD-10-CM

## 2014-12-27 DIAGNOSIS — I4891 Unspecified atrial fibrillation: Secondary | ICD-10-CM

## 2014-12-30 ENCOUNTER — Ambulatory Visit (INDEPENDENT_AMBULATORY_CARE_PROVIDER_SITE_OTHER): Payer: PRIVATE HEALTH INSURANCE | Admitting: Cardiology

## 2014-12-30 DIAGNOSIS — I4891 Unspecified atrial fibrillation: Secondary | ICD-10-CM | POA: Diagnosis not present

## 2014-12-30 DIAGNOSIS — I509 Heart failure, unspecified: Secondary | ICD-10-CM | POA: Diagnosis not present

## 2014-12-30 DIAGNOSIS — C6962 Malignant neoplasm of left orbit: Secondary | ICD-10-CM | POA: Diagnosis not present

## 2014-12-30 DIAGNOSIS — E119 Type 2 diabetes mellitus without complications: Secondary | ICD-10-CM | POA: Diagnosis not present

## 2014-12-30 DIAGNOSIS — Z483 Aftercare following surgery for neoplasm: Secondary | ICD-10-CM | POA: Diagnosis not present

## 2014-12-30 DIAGNOSIS — R2689 Other abnormalities of gait and mobility: Secondary | ICD-10-CM | POA: Diagnosis not present

## 2014-12-30 LAB — POCT INR: INR: 2

## 2015-01-03 DIAGNOSIS — E119 Type 2 diabetes mellitus without complications: Secondary | ICD-10-CM | POA: Diagnosis not present

## 2015-01-03 DIAGNOSIS — I509 Heart failure, unspecified: Secondary | ICD-10-CM | POA: Diagnosis not present

## 2015-01-03 DIAGNOSIS — I4891 Unspecified atrial fibrillation: Secondary | ICD-10-CM | POA: Diagnosis not present

## 2015-01-03 DIAGNOSIS — R2689 Other abnormalities of gait and mobility: Secondary | ICD-10-CM | POA: Diagnosis not present

## 2015-01-03 DIAGNOSIS — Z483 Aftercare following surgery for neoplasm: Secondary | ICD-10-CM | POA: Diagnosis not present

## 2015-01-03 DIAGNOSIS — C6962 Malignant neoplasm of left orbit: Secondary | ICD-10-CM | POA: Diagnosis not present

## 2015-01-04 ENCOUNTER — Ambulatory Visit (INDEPENDENT_AMBULATORY_CARE_PROVIDER_SITE_OTHER): Payer: Medicare Other | Admitting: *Deleted

## 2015-01-04 DIAGNOSIS — I4891 Unspecified atrial fibrillation: Secondary | ICD-10-CM | POA: Diagnosis not present

## 2015-01-04 DIAGNOSIS — Z950 Presence of cardiac pacemaker: Secondary | ICD-10-CM

## 2015-01-04 DIAGNOSIS — I509 Heart failure, unspecified: Secondary | ICD-10-CM | POA: Diagnosis not present

## 2015-01-04 DIAGNOSIS — I480 Paroxysmal atrial fibrillation: Secondary | ICD-10-CM

## 2015-01-04 DIAGNOSIS — Z5181 Encounter for therapeutic drug level monitoring: Secondary | ICD-10-CM | POA: Diagnosis not present

## 2015-01-04 DIAGNOSIS — I495 Sick sinus syndrome: Secondary | ICD-10-CM | POA: Diagnosis not present

## 2015-01-04 DIAGNOSIS — E119 Type 2 diabetes mellitus without complications: Secondary | ICD-10-CM | POA: Diagnosis not present

## 2015-01-04 DIAGNOSIS — C6962 Malignant neoplasm of left orbit: Secondary | ICD-10-CM | POA: Diagnosis not present

## 2015-01-04 DIAGNOSIS — R2689 Other abnormalities of gait and mobility: Secondary | ICD-10-CM | POA: Diagnosis not present

## 2015-01-04 DIAGNOSIS — Z483 Aftercare following surgery for neoplasm: Secondary | ICD-10-CM | POA: Diagnosis not present

## 2015-01-04 LAB — MDC_IDC_ENUM_SESS_TYPE_INCLINIC
Battery Remaining Longevity: 138 mo
Battery Voltage: 2.78 V
Brady Statistic AP VS Percent: 2 %
Date Time Interrogation Session: 20160224140510
Lead Channel Pacing Threshold Amplitude: 0.75 V
Lead Channel Pacing Threshold Pulse Width: 0.4 ms
Lead Channel Setting Pacing Amplitude: 2.5 V
MDC IDC MSMT BATTERY IMPEDANCE: 160 Ohm
MDC IDC MSMT LEADCHNL RA IMPEDANCE VALUE: 323 Ohm
MDC IDC MSMT LEADCHNL RA PACING THRESHOLD AMPLITUDE: 0.5 V
MDC IDC MSMT LEADCHNL RA PACING THRESHOLD PULSEWIDTH: 0.4 ms
MDC IDC MSMT LEADCHNL RA SENSING INTR AMPL: 4 mV
MDC IDC MSMT LEADCHNL RV IMPEDANCE VALUE: 836 Ohm
MDC IDC MSMT LEADCHNL RV SENSING INTR AMPL: 22.4 mV
MDC IDC SET LEADCHNL RA PACING AMPLITUDE: 2 V
MDC IDC SET LEADCHNL RV PACING PULSEWIDTH: 0.4 ms
MDC IDC SET LEADCHNL RV SENSING SENSITIVITY: 5.6 mV
MDC IDC STAT BRADY AP VP PERCENT: 1 %
MDC IDC STAT BRADY AS VP PERCENT: 1 %
MDC IDC STAT BRADY AS VS PERCENT: 96 %

## 2015-01-04 LAB — POCT INR: INR: 1

## 2015-01-04 NOTE — Progress Notes (Signed)
Pacemaker check in clinic. Normal device function. Thresholds, sensing, impedances consistent with previous measurements. Device programmed to maximize longevity. 35.4% mode switch + warfarin. 5 NSVT---all noise---were during pt's surgery. Device programmed at appropriate safety margins. Histogram distribution appropriate for patient activity level. Device programmed to optimize intrinsic conduction. Estimated longevity 11.39yrs. [facility] 04/07/15 & ROV w/ Dr. Rayann Heman 10/16.

## 2015-01-06 ENCOUNTER — Ambulatory Visit (INDEPENDENT_AMBULATORY_CARE_PROVIDER_SITE_OTHER): Payer: PRIVATE HEALTH INSURANCE | Admitting: Pharmacist

## 2015-01-06 DIAGNOSIS — I509 Heart failure, unspecified: Secondary | ICD-10-CM | POA: Diagnosis not present

## 2015-01-06 DIAGNOSIS — R2689 Other abnormalities of gait and mobility: Secondary | ICD-10-CM | POA: Diagnosis not present

## 2015-01-06 DIAGNOSIS — C6962 Malignant neoplasm of left orbit: Secondary | ICD-10-CM | POA: Diagnosis not present

## 2015-01-06 DIAGNOSIS — Z483 Aftercare following surgery for neoplasm: Secondary | ICD-10-CM | POA: Diagnosis not present

## 2015-01-06 DIAGNOSIS — I4891 Unspecified atrial fibrillation: Secondary | ICD-10-CM | POA: Diagnosis not present

## 2015-01-06 DIAGNOSIS — E119 Type 2 diabetes mellitus without complications: Secondary | ICD-10-CM | POA: Diagnosis not present

## 2015-01-06 LAB — POCT INR: INR: 1.1

## 2015-01-10 DIAGNOSIS — Z483 Aftercare following surgery for neoplasm: Secondary | ICD-10-CM | POA: Diagnosis not present

## 2015-01-10 DIAGNOSIS — E119 Type 2 diabetes mellitus without complications: Secondary | ICD-10-CM | POA: Diagnosis not present

## 2015-01-10 DIAGNOSIS — I509 Heart failure, unspecified: Secondary | ICD-10-CM | POA: Diagnosis not present

## 2015-01-10 DIAGNOSIS — R2689 Other abnormalities of gait and mobility: Secondary | ICD-10-CM | POA: Diagnosis not present

## 2015-01-10 DIAGNOSIS — I4891 Unspecified atrial fibrillation: Secondary | ICD-10-CM | POA: Diagnosis not present

## 2015-01-10 DIAGNOSIS — C6962 Malignant neoplasm of left orbit: Secondary | ICD-10-CM | POA: Diagnosis not present

## 2015-01-11 ENCOUNTER — Ambulatory Visit (INDEPENDENT_AMBULATORY_CARE_PROVIDER_SITE_OTHER): Payer: Medicare Other | Admitting: *Deleted

## 2015-01-11 DIAGNOSIS — Z5181 Encounter for therapeutic drug level monitoring: Secondary | ICD-10-CM | POA: Diagnosis not present

## 2015-01-11 LAB — POCT INR: INR: 3

## 2015-01-12 DIAGNOSIS — C6962 Malignant neoplasm of left orbit: Secondary | ICD-10-CM | POA: Diagnosis not present

## 2015-01-12 DIAGNOSIS — I4891 Unspecified atrial fibrillation: Secondary | ICD-10-CM | POA: Diagnosis not present

## 2015-01-12 DIAGNOSIS — I509 Heart failure, unspecified: Secondary | ICD-10-CM | POA: Diagnosis not present

## 2015-01-12 DIAGNOSIS — Z483 Aftercare following surgery for neoplasm: Secondary | ICD-10-CM | POA: Diagnosis not present

## 2015-01-12 DIAGNOSIS — E119 Type 2 diabetes mellitus without complications: Secondary | ICD-10-CM | POA: Diagnosis not present

## 2015-01-12 DIAGNOSIS — R2689 Other abnormalities of gait and mobility: Secondary | ICD-10-CM | POA: Diagnosis not present

## 2015-01-13 ENCOUNTER — Ambulatory Visit (INDEPENDENT_AMBULATORY_CARE_PROVIDER_SITE_OTHER): Payer: PRIVATE HEALTH INSURANCE | Admitting: Cardiovascular Disease

## 2015-01-13 DIAGNOSIS — J310 Chronic rhinitis: Secondary | ICD-10-CM | POA: Diagnosis not present

## 2015-01-13 DIAGNOSIS — R2689 Other abnormalities of gait and mobility: Secondary | ICD-10-CM | POA: Diagnosis not present

## 2015-01-13 DIAGNOSIS — Z483 Aftercare following surgery for neoplasm: Secondary | ICD-10-CM | POA: Diagnosis not present

## 2015-01-13 DIAGNOSIS — E119 Type 2 diabetes mellitus without complications: Secondary | ICD-10-CM | POA: Diagnosis not present

## 2015-01-13 DIAGNOSIS — I509 Heart failure, unspecified: Secondary | ICD-10-CM | POA: Diagnosis not present

## 2015-01-13 DIAGNOSIS — C6962 Malignant neoplasm of left orbit: Secondary | ICD-10-CM | POA: Diagnosis not present

## 2015-01-13 DIAGNOSIS — I4891 Unspecified atrial fibrillation: Secondary | ICD-10-CM | POA: Diagnosis not present

## 2015-01-13 LAB — POCT INR: INR: 5.1

## 2015-01-16 DIAGNOSIS — E119 Type 2 diabetes mellitus without complications: Secondary | ICD-10-CM | POA: Diagnosis not present

## 2015-01-16 DIAGNOSIS — I4891 Unspecified atrial fibrillation: Secondary | ICD-10-CM | POA: Diagnosis not present

## 2015-01-16 DIAGNOSIS — I509 Heart failure, unspecified: Secondary | ICD-10-CM | POA: Diagnosis not present

## 2015-01-16 DIAGNOSIS — C6962 Malignant neoplasm of left orbit: Secondary | ICD-10-CM | POA: Diagnosis not present

## 2015-01-16 DIAGNOSIS — Z483 Aftercare following surgery for neoplasm: Secondary | ICD-10-CM | POA: Diagnosis not present

## 2015-01-16 DIAGNOSIS — R2689 Other abnormalities of gait and mobility: Secondary | ICD-10-CM | POA: Diagnosis not present

## 2015-01-18 DIAGNOSIS — C6962 Malignant neoplasm of left orbit: Secondary | ICD-10-CM | POA: Diagnosis not present

## 2015-01-18 DIAGNOSIS — Z483 Aftercare following surgery for neoplasm: Secondary | ICD-10-CM | POA: Diagnosis not present

## 2015-01-18 DIAGNOSIS — R2689 Other abnormalities of gait and mobility: Secondary | ICD-10-CM | POA: Diagnosis not present

## 2015-01-18 DIAGNOSIS — E119 Type 2 diabetes mellitus without complications: Secondary | ICD-10-CM | POA: Diagnosis not present

## 2015-01-18 DIAGNOSIS — I509 Heart failure, unspecified: Secondary | ICD-10-CM | POA: Diagnosis not present

## 2015-01-18 DIAGNOSIS — I4891 Unspecified atrial fibrillation: Secondary | ICD-10-CM | POA: Diagnosis not present

## 2015-01-20 ENCOUNTER — Ambulatory Visit (INDEPENDENT_AMBULATORY_CARE_PROVIDER_SITE_OTHER): Payer: PRIVATE HEALTH INSURANCE

## 2015-01-20 ENCOUNTER — Telehealth: Payer: Self-pay | Admitting: Internal Medicine

## 2015-01-20 DIAGNOSIS — I4891 Unspecified atrial fibrillation: Secondary | ICD-10-CM | POA: Diagnosis not present

## 2015-01-20 DIAGNOSIS — Z483 Aftercare following surgery for neoplasm: Secondary | ICD-10-CM | POA: Diagnosis not present

## 2015-01-20 DIAGNOSIS — E119 Type 2 diabetes mellitus without complications: Secondary | ICD-10-CM | POA: Diagnosis not present

## 2015-01-20 DIAGNOSIS — R2689 Other abnormalities of gait and mobility: Secondary | ICD-10-CM | POA: Diagnosis not present

## 2015-01-20 DIAGNOSIS — I509 Heart failure, unspecified: Secondary | ICD-10-CM | POA: Diagnosis not present

## 2015-01-20 DIAGNOSIS — C6962 Malignant neoplasm of left orbit: Secondary | ICD-10-CM | POA: Diagnosis not present

## 2015-01-20 LAB — POCT INR: INR: 3.5

## 2015-01-20 NOTE — Telephone Encounter (Signed)
See anticoagulation note in Epic.

## 2015-01-20 NOTE — Telephone Encounter (Signed)
New Message  Tiffany from [facility] wants to give INR levels for pt. Please call and discuss.

## 2015-01-24 ENCOUNTER — Encounter: Payer: Self-pay | Admitting: Internal Medicine

## 2015-01-24 ENCOUNTER — Other Ambulatory Visit: Payer: Self-pay | Admitting: *Deleted

## 2015-01-25 DIAGNOSIS — C6962 Malignant neoplasm of left orbit: Secondary | ICD-10-CM | POA: Diagnosis not present

## 2015-01-25 DIAGNOSIS — R2689 Other abnormalities of gait and mobility: Secondary | ICD-10-CM | POA: Diagnosis not present

## 2015-01-25 DIAGNOSIS — E119 Type 2 diabetes mellitus without complications: Secondary | ICD-10-CM | POA: Diagnosis not present

## 2015-01-25 DIAGNOSIS — I509 Heart failure, unspecified: Secondary | ICD-10-CM | POA: Diagnosis not present

## 2015-01-25 DIAGNOSIS — Z483 Aftercare following surgery for neoplasm: Secondary | ICD-10-CM | POA: Diagnosis not present

## 2015-01-25 DIAGNOSIS — I4891 Unspecified atrial fibrillation: Secondary | ICD-10-CM | POA: Diagnosis not present

## 2015-01-25 MED ORDER — PRAVASTATIN SODIUM 40 MG PO TABS: ORAL_TABLET | ORAL | Status: DC

## 2015-01-27 LAB — POCT INR: INR: 1.4

## 2015-01-30 ENCOUNTER — Ambulatory Visit (INDEPENDENT_AMBULATORY_CARE_PROVIDER_SITE_OTHER): Payer: PRIVATE HEALTH INSURANCE | Admitting: Interventional Cardiology

## 2015-01-30 DIAGNOSIS — E119 Type 2 diabetes mellitus without complications: Secondary | ICD-10-CM | POA: Diagnosis not present

## 2015-01-30 DIAGNOSIS — H3532 Exudative age-related macular degeneration: Secondary | ICD-10-CM | POA: Diagnosis not present

## 2015-02-03 ENCOUNTER — Ambulatory Visit (INDEPENDENT_AMBULATORY_CARE_PROVIDER_SITE_OTHER): Payer: Medicare Other | Admitting: *Deleted

## 2015-02-03 DIAGNOSIS — Z5181 Encounter for therapeutic drug level monitoring: Secondary | ICD-10-CM | POA: Diagnosis not present

## 2015-02-03 DIAGNOSIS — I4891 Unspecified atrial fibrillation: Secondary | ICD-10-CM | POA: Diagnosis not present

## 2015-02-03 DIAGNOSIS — I4819 Other persistent atrial fibrillation: Secondary | ICD-10-CM | POA: Insufficient documentation

## 2015-02-03 LAB — POCT INR: INR: 2.2

## 2015-02-10 ENCOUNTER — Encounter: Payer: Self-pay | Admitting: Physician Assistant

## 2015-02-10 ENCOUNTER — Ambulatory Visit (INDEPENDENT_AMBULATORY_CARE_PROVIDER_SITE_OTHER): Payer: Medicare Other | Admitting: Physician Assistant

## 2015-02-10 VITALS — BP 140/72 | HR 84 | Temp 97.7°F | Resp 16 | Ht 63.0 in | Wt 142.0 lb

## 2015-02-10 DIAGNOSIS — Z0001 Encounter for general adult medical examination with abnormal findings: Secondary | ICD-10-CM

## 2015-02-10 DIAGNOSIS — Z79899 Other long term (current) drug therapy: Secondary | ICD-10-CM

## 2015-02-10 DIAGNOSIS — E1129 Type 2 diabetes mellitus with other diabetic kidney complication: Secondary | ICD-10-CM | POA: Diagnosis not present

## 2015-02-10 DIAGNOSIS — I10 Essential (primary) hypertension: Secondary | ICD-10-CM | POA: Diagnosis not present

## 2015-02-10 DIAGNOSIS — E1121 Type 2 diabetes mellitus with diabetic nephropathy: Secondary | ICD-10-CM

## 2015-02-10 DIAGNOSIS — C4491 Basal cell carcinoma of skin, unspecified: Secondary | ICD-10-CM | POA: Insufficient documentation

## 2015-02-10 DIAGNOSIS — I4891 Unspecified atrial fibrillation: Secondary | ICD-10-CM

## 2015-02-10 DIAGNOSIS — E785 Hyperlipidemia, unspecified: Secondary | ICD-10-CM | POA: Diagnosis not present

## 2015-02-10 DIAGNOSIS — Z7901 Long term (current) use of anticoagulants: Secondary | ICD-10-CM

## 2015-02-10 DIAGNOSIS — E559 Vitamin D deficiency, unspecified: Secondary | ICD-10-CM

## 2015-02-10 DIAGNOSIS — I495 Sick sinus syndrome: Secondary | ICD-10-CM

## 2015-02-10 DIAGNOSIS — E1140 Type 2 diabetes mellitus with diabetic neuropathy, unspecified: Secondary | ICD-10-CM | POA: Diagnosis not present

## 2015-02-10 DIAGNOSIS — I480 Paroxysmal atrial fibrillation: Secondary | ICD-10-CM

## 2015-02-10 DIAGNOSIS — I119 Hypertensive heart disease without heart failure: Secondary | ICD-10-CM

## 2015-02-10 DIAGNOSIS — Z5181 Encounter for therapeutic drug level monitoring: Secondary | ICD-10-CM

## 2015-02-10 DIAGNOSIS — R6889 Other general symptoms and signs: Secondary | ICD-10-CM | POA: Diagnosis not present

## 2015-02-10 DIAGNOSIS — H3500 Unspecified background retinopathy: Secondary | ICD-10-CM

## 2015-02-10 LAB — CBC WITH DIFFERENTIAL/PLATELET
BASOS PCT: 0 % (ref 0–1)
Basophils Absolute: 0 10*3/uL (ref 0.0–0.1)
EOS ABS: 0.2 10*3/uL (ref 0.0–0.7)
EOS PCT: 3 % (ref 0–5)
HCT: 34.5 % — ABNORMAL LOW (ref 36.0–46.0)
HEMOGLOBIN: 11.2 g/dL — AB (ref 12.0–15.0)
LYMPHS ABS: 2.4 10*3/uL (ref 0.7–4.0)
LYMPHS PCT: 32 % (ref 12–46)
MCH: 28.6 pg (ref 26.0–34.0)
MCHC: 32.5 g/dL (ref 30.0–36.0)
MCV: 88.2 fL (ref 78.0–100.0)
MONO ABS: 0.4 10*3/uL (ref 0.1–1.0)
MPV: 8.8 fL (ref 8.6–12.4)
Monocytes Relative: 6 % (ref 3–12)
NEUTROS PCT: 59 % (ref 43–77)
Neutro Abs: 4.4 10*3/uL (ref 1.7–7.7)
Platelets: 285 10*3/uL (ref 150–400)
RBC: 3.91 MIL/uL (ref 3.87–5.11)
RDW: 14.2 % (ref 11.5–15.5)
WBC: 7.4 10*3/uL (ref 4.0–10.5)

## 2015-02-10 NOTE — Patient Instructions (Signed)
Bad carbs also include fruit juice, alcohol, and sweet tea. These are empty calories that do not signal to your brain that you are full.   Please remember the good carbs are still carbs which convert into sugar. So please measure them out no more than 1/2-1 cup of rice, oatmeal, pasta, and beans  Veggies are however free foods! Pile them on.   Not all fruit is created equal. Please see the list below, the fruit at the bottom is higher in sugars than the fruit at the top. Please avoid all dried fruits.      Preventive Care for Adults A healthy lifestyle and preventive care can promote health and wellness. Preventive health guidelines for women include the following key practices. 5. A routine yearly physical is a good way to check with your health care provider about your health and preventive screening. It is a chance to share any concerns and updates on your health and to receive a thorough exam. 6. Visit your dentist for a routine exam and preventive care every 6 months. Brush your teeth twice a day and floss once a day. Good oral hygiene prevents tooth decay and gum disease. 7. The frequency of eye exams is based on your age, health, family medical history, use of contact lenses, and other factors. Follow your health care provider's recommendations for frequency of eye exams. 8. Eat a healthy diet. Foods like vegetables, fruits, whole grains, low-fat dairy products, and lean protein foods contain the nutrients you need without too many calories. Decrease your intake of foods high in solid fats, added sugars, and salt. Eat the right amount of calories for you.Get information about a proper diet from your health care provider, if necessary. 9. Regular physical exercise is one of the most important things you can do for your health. Most adults should get at least 150 minutes of moderate-intensity exercise (any activity that increases your heart rate and causes you to sweat) each week. In  addition, most adults need muscle-strengthening exercises on 2 or more days a week. 10. Maintain a healthy weight. The body mass index (BMI) is a screening tool to identify possible weight problems. It provides an estimate of body fat based on height and weight. Your health care provider can find your BMI and can help you achieve or maintain a healthy weight.For adults 20 years and older: 1. A BMI below 18.5 is considered underweight. 2. A BMI of 18.5 to 24.9 is normal. 3. A BMI of 25 to 29.9 is considered overweight. 4. A BMI of 30 and above is considered obese. 11. Maintain normal blood lipids and cholesterol levels by exercising and minimizing your intake of saturated fat. Eat a balanced diet with plenty of fruit and vegetables. If your lipid or cholesterol levels are high, you are over 50, or you are at high risk for heart disease, you may need your cholesterol levels checked more frequently.Ongoing high lipid and cholesterol levels should be treated with medicines if diet and exercise are not working. 12. If you smoke, find out from your health care provider how to quit. If you do not use tobacco, do not start. 68. Lung cancer screening is recommended for adults aged 72-80 years who are at high risk for developing lung cancer because of a history of smoking. A yearly low-dose CT scan of the lungs is recommended for people who have at least a 30-pack-year history of smoking and are a current smoker or have quit within the past  15 years. A pack year of smoking is smoking an average of 1 pack of cigarettes a day for 1 year (for example: 1 pack a day for 30 years or 2 packs a day for 15 years). Yearly screening should continue until the smoker has stopped smoking for at least 15 years. Yearly screening should be stopped for people who develop a health problem that would prevent them from having lung cancer treatment. 14. Avoid use of street drugs. Do not share needles with anyone. Ask for help if you  need support or instructions about stopping the use of drugs. 15. High blood pressure causes heart disease and increases the risk of stroke.  Ongoing high blood pressure should be treated with medicines if weight loss and exercise do not work. 3. If you are 34-67 years old, ask your health care provider if you should take aspirin to prevent strokes. 17. Diabetes screening involves taking a blood sample to check your fasting blood sugar level. This should be done once every 3 years, after age 90, if you are within normal weight and without risk factors for diabetes. Testing should be considered at a younger age or be carried out more frequently if you are overweight and have at least 1 risk factor for diabetes. 18. Breast cancer screening is essential preventive care for women. You should practice "breast self-awareness." This means understanding the normal appearance and feel of your breasts and may include breast self-examination. Any changes detected, no matter how small, should be reported to a health care provider. Women in their 75s and 30s should have a clinical breast exam (CBE) by a health care provider as part of a regular health exam every 1 to 3 years. After age 68, women should have a CBE every year. Starting at age 34, women should consider having a mammogram (breast X-ray test) every year. Women who have a family history of breast cancer should talk to their health care provider about genetic screening. Women at a high risk of breast cancer should talk to their health care providers about having an MRI and a mammogram every year. 80. Breast cancer gene (BRCA)-related cancer risk assessment is recommended for women who have family members with BRCA-related cancers. BRCA-related cancers include breast, ovarian, tubal, and peritoneal cancers. Having family members with these cancers may be associated with an increased risk for harmful changes (mutations) in the breast cancer genes BRCA1 and BRCA2.  Results of the assessment will determine the need for genetic counseling and BRCA1 and BRCA2 testing. 20. Routine pelvic exams to screen for cancer are no longer recommended for nonpregnant women who are considered low risk for cancer of the pelvic organs (ovaries, uterus, and vagina) and who do not have symptoms. Ask your health care provider if a screening pelvic exam is right for you. 21. If you have had past treatment for cervical cancer or a condition that could lead to cancer, you need Pap tests and screening for cancer for at least 20 years after your treatment. If Pap tests have been discontinued, your risk factors (such as having a new sexual partner) need to be reassessed to determine if screening should be resumed. Some women have medical problems that increase the chance of getting cervical cancer. In these cases, your health care provider may recommend more frequent screening and Pap tests. 22.  23. Colorectal cancer can be detected and often prevented. Most routine colorectal cancer screening begins at the age of 36 years and continues through age 38 years.  However, your health care provider may recommend screening at an earlier age if you have risk factors for colon cancer. On a yearly basis, your health care provider may provide home test kits to check for hidden blood in the stool. Use of a small camera at the end of a tube, to directly examine the colon (sigmoidoscopy or colonoscopy), can detect the earliest forms of colorectal cancer. Talk to your health care provider about this at age 47, when routine screening begins. Direct exam of the colon should be repeated every 5-10 years through age 71 years, unless early forms of pre-cancerous polyps or small growths are found. 24. Osteoporosis is a disease in which the bones lose minerals and strength with aging. This can result in serious bone fractures or breaks. The risk of osteoporosis can be identified using a bone density scan. Women ages 48  years and over and women at risk for fractures or osteoporosis should discuss screening with their health care providers. Ask your health care provider whether you should take a calcium supplement or vitamin D to reduce the rate of osteoporosis. 25. Menopause can be associated with physical symptoms and risks. Hormone replacement therapy is available to decrease symptoms and risks. You should talk to your health care provider about whether hormone replacement therapy is right for you. 26. Use sunscreen. Apply sunscreen liberally and repeatedly throughout the day. You should seek shade when your shadow is shorter than you. Protect yourself by wearing long sleeves, pants, a wide-brimmed hat, and sunglasses year round, whenever you are outdoors. 27. Once a month, do a whole body skin exam, using a mirror to look at the skin on your back. Tell your health care provider of new moles, moles that have irregular borders, moles that are larger than a pencil eraser, or moles that have changed in shape or color. 28. Stay current with required vaccines (immunizations). 1. Influenza vaccine. All adults should be immunized every year. 2. Tetanus, diphtheria, and acellular pertussis (Td, Tdap) vaccine. Pregnant women should receive 1 dose of Tdap vaccine during each pregnancy. The dose should be obtained regardless of the length of time since the last dose. Immunization is preferred during the 27th-36th week of gestation. An adult who has not previously received Tdap or who does not know her vaccine status should receive 1 dose of Tdap. This initial dose should be followed by tetanus and diphtheria toxoids (Td) booster doses every 10 years. Adults with an unknown or incomplete history of completing a 3-dose immunization series with Td-containing vaccines should begin or complete a primary immunization series including a Tdap dose. Adults should receive a Td booster every 10 years. 3.  4. Zoster vaccine. One dose is  recommended for adults aged 32 years or older unless certain conditions are present. 5.  6. Pneumococcal 13-valent conjugate (PCV13) vaccine. When indicated, a person who is uncertain of her immunization history and has no record of immunization should receive the PCV13 vaccine. An adult aged 60 years or older who has certain medical conditions and has not been previously immunized should receive 1 dose of PCV13 vaccine. This PCV13 should be followed with a dose of pneumococcal polysaccharide (PPSV23) vaccine. The PPSV23 vaccine dose should be obtained at least 8 weeks after the dose of PCV13 vaccine. An adult aged 21 years or older who has certain medical conditions and previously received 1 or more doses of PPSV23 vaccine should receive 1 dose of PCV13. The PCV13 vaccine dose should be obtained 1 or more  years after the last PPSV23 vaccine dose. 7.  8. Pneumococcal polysaccharide (PPSV23) vaccine. When PCV13 is also indicated, PCV13 should be obtained first. All adults aged 44 years and older should be immunized. An adult younger than age 54 years who has certain medical conditions should be immunized. Any person who resides in a nursing home or long-term care facility should be immunized. An adult smoker should be immunized. People with an immunocompromised condition and certain other conditions should receive both PCV13 and PPSV23 vaccines. People with human immunodeficiency virus (HIV) infection should be immunized as soon as possible after diagnosis. Immunization during chemotherapy or radiation therapy should be avoided. Routine use of PPSV23 vaccine is not recommended for American Indians, [place] Natives, or people younger than 65 years unless there are medical conditions that require PPSV23 vaccine. When indicated, people who have unknown immunization and have no record of immunization should receive PPSV23 vaccine. One-time revaccination 5 years after the first dose of PPSV23 is recommended for people  aged 19-64 years who have chronic kidney failure, nephrotic syndrome, asplenia, or immunocompromised conditions. People who received 1-2 doses of PPSV23 before age 2 years should receive another dose of PPSV23 vaccine at age 81 years or later if at least 5 years have passed since the previous dose. Doses of PPSV23 are not needed for people immunized with PPSV23 at or after age 28 years. 9.  Preventive Services / Frequency  Ages 71 years and over  Blood pressure check.  Lipid and cholesterol check.  Lung cancer screening. / Every year if you are aged 20-80 years and have a 30-pack-year history of smoking and currently smoke or have quit within the past 15 years. Yearly screening is stopped once you have quit smoking for at least 15 years or develop a health problem that would prevent you from having lung cancer treatment.  Clinical breast exam.** / Every year after age 7 years.  BRCA-related cancer risk assessment.** / For women who have family members with a BRCA-related cancer (breast, ovarian, tubal, or peritoneal cancers).  Mammogram.** / Every year beginning at age 67 years and continuing for as long as you are in good health. Consult with your health care provider.  Pap test.** / Every 3 years starting at age 70 years through age 3 or 74 years with 3 consecutive normal Pap tests. Testing can be stopped between 65 and 70 years with 3 consecutive normal Pap tests and no abnormal Pap or HPV tests in the past 10 years.  Fecal occult blood test (FOBT) of stool. / Every year beginning at age 42 years and continuing until age 75 years. You may not need to do this test if you get a colonoscopy every 10 years.  Flexible sigmoidoscopy or colonoscopy.** / Every 5 years for a flexible sigmoidoscopy or every 10 years for a colonoscopy beginning at age 44 years and continuing until age 17 years.  Hepatitis C blood test.** / For all people born from [birth year] through 1963 and any individual with known  risks for hepatitis C.  Osteoporosis screening.** / A one-time screening for women ages 45 years and over and women at risk for fractures or osteoporosis.  Skin self-exam. / Monthly.  Influenza vaccine. / Every year.  Tetanus, diphtheria, and acellular pertussis (Tdap/Td) vaccine.** / 1 dose of Td every 10 years.  Zoster vaccine.** / 1 dose for adults aged 26 years or older.  Pneumococcal 13-valent conjugate (PCV13) vaccine.** / Consult your health care provider.  Pneumococcal polysaccharide (PPSV23) vaccine.** /  1 dose for all adults aged 30 years and older. Screening for abdominal aortic aneurysm (AAA)  by ultrasound is recommended for people who have history of high blood pressure or who are current or former smokers.

## 2015-02-10 NOTE — Progress Notes (Signed)
MEDICARE ANNUAL WELLNESS VISIT AND CPE  Assessment:   1. Hypertensive heart disease without heart failure - continue medications, DASH diet, exercise and monitor at home. Call if greater than 130/80.   2. Essential hypertension - CBC with Differential/Platelet - BASIC METABOLIC PANEL WITH GFR - Hepatic function panel - TSH - Urinalysis, Routine w reflex microscopic - Microalbumin / creatinine urine ratio  3. PAF (paroxysmal atrial fibrillation)  4. T2_NIDDM w/Stage 3 CKD (GFR 40 ml/min) Discussed general issues about diabetes pathophysiology and management., Educational material distributed., Suggested low cholesterol diet., Encouraged aerobic exercise., Discussed foot care., Reminded to get yearly retinal exam. - Hemoglobin A1c - Insulin, fasting - HM DIABETES FOOT EXAM  5. Tachycardia-bradycardia Has pacemaker  6. Sick sinus syndrome Has pacemaker Continue cardio follow up  7. Atrial fibrillation, unspecified Follows with coumadin clinic, rate controlled  8. Hyperlipidemia -continue medications, check lipids, decrease fatty foods, increase activity.  - Lipid panel  9. Vitamin D deficiency ,Continue supplement  10. Medication management - Magnesium  11. Encounter for therapeutic drug monitoring  12. Retinopathy Continue follow up eye doctor  13. Long term current use of anticoagulant therapy Follows coumadin clinic  14. Basal cell carcinoma Continue follow up [facility]  15. DM neuropathy, type II diabetes mellitus Declines medications at this time, discussed feet checks.   57. Encounter for general adult medical examination with abnormal findings  Over 40 minutes of exam, counseling, chart review and critical decision making was performed  Plan:   During the course of the visit the patient was educated and counseled about appropriate screening and preventive services including:    Pneumococcal vaccine   Influenza vaccine  Td vaccine  Screening  electrocardiogram  Bone densitometry screening  Colorectal cancer screening  Diabetes screening  Glaucoma screening  Nutrition counseling   Advanced directives: requested  Conditions/risks identified: BMI: Discussed weight loss, diet, and increase physical activity.  Increase physical activity: AHA recommends 150 minutes of physical activity a week.  Medications reviewed Diabetes is not at goal, ACE/ARB therapy: Yes. Urinary Incontinence is an issue: discussed non pharmacology and pharmacology options.  Fall risk: high- discussed PT, home fall assessment, medications.    Subjective:  Kylie Sanchez is a 81 y.o. female who presents for Medicare Annual Wellness Visit and complete physical.  Date of last medicare wellness visit was 02/09/2014 Her blood pressure has been controlled at home, today their BP is BP: 140/72 mmHg She does not workout. She denies chest pain, shortness of breath, dizziness.  She has pAfib since 2011 and has SSS s/p pacemaker placement in 2013, she is on coumadin and follows with the coumadin clinic.  She is on cholesterol medication and denies myalgias. Her cholesterol is at goal. The cholesterol last visit was:   Lab Results  Component Value Date   CHOL 153 11/08/2014   HDL 61 11/08/2014   LDLCALC 56 11/08/2014   TRIG 182* 11/08/2014   CHOLHDL 2.5 11/08/2014   She has been working on diet and exercise for Diabetes with diabetic chronic kidney disease stage III, she is not on bASA, she is on ACE/ARB, and denies hypoglycemia , polydipsia and polyuria. She does not check her sugars regularly.Last A1C was:  Lab Results  Component Value Date   HGBA1C 6.7* 11/08/2014  Patient is on Vitamin D supplement.   Lab Results  Component Value Date   VD25OH 44 11/08/2014  She had left orbital exenteration with anterior craniofacial resection and reconstruction with Dr. Mardee Postin at [facility] on 11/29/2014  for a [id number] basal cell carcinoma, she states that she is actually  feeling much better.  She is tolerating the procedure well, she did not drive before the procedure due to her vision and she has people in her building bring her. She states that she did PT after the surgery but she continues to have imbalance adjusting to her vision and some weakness in her legs, walks with a walker. Denies falls in past year.  One of her neighbors that helped a lot, Lynann Bologna moved but other neighbors have helped get her to and from appointments.  She was taking care of her sister with dementia however she is now in a NH and this has helped eliminate a lot of stress.   Medication Review: Current Outpatient Prescriptions on File Prior to Visit  Medication Sig Dispense Refill  . amLODipine (NORVASC) 10 MG tablet Take 1 tablet (10 mg total) daily 90 tablet 3  . b complex vitamins tablet Take 1 tablet by mouth daily.    . Coenzyme Q10 (CO Q-10) 200 MG CAPS Take 1 capsule by mouth daily.     Marland Kitchen glimepiride (AMARYL) 1 MG tablet TAKE ONE TABLET BY MOUTH ONCE DAILY 90 tablet 0  . losartan (COZAAR) 100 MG tablet Take 1 tablet (100 mg total) by mouth daily. 30 tablet 11  . Magnesium 250 MG TABS Take 1 tablet by mouth daily.     . metFORMIN (GLUCOPHAGE) 500 MG tablet TAKE TWO TABLETS BY MOUTH TWICE DAILY 120 tablet 3  . metoprolol (TOPROL-XL) 200 MG 24 hr tablet TAKE 1 TABLET BY MOUTH EVERY DAY 90 tablet 0  . ONE TOUCH ULTRA TEST test strip USE ONE STRIP TO TEST BLOOD GLUCOSE TWICE DAILY 100 each 6  . pravastatin (PRAVACHOL) 40 MG tablet TAKE ONE TABLET BY MOUTH ONCE DAILY AT BEDTIME FOR  CHOLESTEROL 90 tablet 1  . warfarin (COUMADIN) 5 MG tablet Take 1 tablet (5 mg total) by mouth one time only at 6 PM. Take as directed by Coumadin Clinic. 30 tablet 1   No current facility-administered medications on file prior to visit.    Current Problems (verified) Patient Active Problem List   Diagnosis Date Noted  . Basal cell carcinoma 02/10/2015  . Atrial fibrillation [I48.91] 02/03/2015  .  Encounter for therapeutic drug monitoring 02/03/2015  . HTN (hypertension) 11/08/2014  . T2_NIDDM w/Stage 3 CKD (GFR 40 ml/min) 11/08/2014  . Vitamin D deficiency 11/08/2014  . Medication management 11/08/2014  . Hyperlipidemia   . Sick sinus syndrome   . Retinopathy   . Long term current use of anticoagulant therapy 04/30/2012  . Tachycardia-bradycardia 04/19/2012  . PAF (paroxysmal atrial fibrillation) 04/18/2011  . Hypertensive cardiovascular disease 04/18/2011    Screening Tests Preventative care: Last colonoscopy: declines Last mammogram: declines Last pap smear/pelvic exam: declines  DEXA:declines  Prior vaccinations: TD or Tdap: declines  Influenza: declines Pneumococcal: declines Prevnar13: dec;lines Shingles/Zostavax: declines  Names of Other Physician/Practitioners you currently use: 1. [facility] here for primary care 2. Dr. Nicki Reaper, eye doctor, last visit recent 3. Dr. Kristeen Mans, cardio  Patient Care Team: Unk Pinto, MD as PCP - General (Internal Medicine) Sherlynn Stalls, MD as Consulting Physician (Ophthalmology)  SURGICAL HISTORY Past Surgical History  Procedure Laterality Date  . Pacemaker insertion  04/20/12    MDT Adapta L implanted by Dr Rayann Heman  . Dental surgery    . Permanent pacemaker insertion N/A 04/20/2012    Procedure: PERMANENT PACEMAKER INSERTION;  Surgeon: Jeneen Rinks  Allred, MD;  Location: [place] CATH [facility];  Service: Cardiovascular;  Laterality: N/A;   FAMILY HISTORY Family History  Problem Relation Age of Onset  . Heart attack Father   . Hypertension Mother   . Cancer Other    SOCIAL HISTORY History  Substance Use Topics  . Smoking status: Never Smoker   . Smokeless tobacco: Not on file  . Alcohol Use: No    MEDICARE WELLNESS OBJECTIVES: Tobacco use: She does not smoke.  Patient is not a former smoker. Alcohol Current alcohol use: none Caffeine Current caffeine use: denies use Osteoporosis:  postmenopausal estrogen deficiency and dietary calcium and/or vitamin D deficiency, History of fracture in the past year: no Diet: in general, a "healthy" diet   Physical activity: ADLs and sendentary work Depression/mood screen:   Depression screen [facility] 2/9 02/10/2015  Decreased Interest 0  Down, Depressed, Hopeless 0  PHQ - 2 Score 0   Hearing: normal Visual acuity: impaired,  does not perform annual eye exam  ADLs:  In your present state of health, do you have any difficulty performing the following activities: 02/10/2015  Is the patient deaf or have difficulty hearing? N  Hearing Y  Vision N  Difficulty concentrating or making decisions Y  Walking or climbing stairs? N  Doing errands, shopping? Y  Preparing Food and eating ? Y  Using the Toilet? Y  In the past six months, have you accidently leaked urine? Y  Do you have problems with loss of bowel control? N  Managing your Medications? N  Managing your Finances? N  Housekeeping or managing your Housekeeping? Y    Fall risk: High Risk Cognitive Testing  Alert? Yes  Normal Appearance?Yes  Oriented to person? Yes  Place? Yes   Time? Yes  Recall of three objects?  Yes  Can perform simple calculations? Yes  Displays appropriate judgment?Yes  Can read the correct time from a watch face?Yes EOL planning: Does patient have an advance directive?: Yes Type of Advance Directive: Velma, Living will Does patient want to make changes to advanced directive?: No - Patient declined Copy of advanced directive(s) in chart?: No - copy requested     Objective:     Blood pressure 140/72, pulse 84, temperature 97.7 F (36.5 C), resp. rate 16, height 5\' 3"  (1.6 m), weight 142 lb (64.411 kg). Body mass index is 25.16 kg/(m^2).  General appearance: alert, no distress, WD/WN, female HEENT: normocephalic, evidence of left orbital exenteration with heavy crusting at left medial canthal region, TMs pearly, nares patent, no  discharge or erythema, pharynx normal Oral cavity: MMM, no lesions Neck: supple, no lymphadenopathy, no thyromegaly, no masses Heart: RRR, normal S1, S2, no murmurs Lungs: CTA bilaterally, no wheezes, rhonchi, or rales Abdomen: +bs, soft, non tender, non distended, no masses, no hepatomegaly, no splenomegaly Musculoskeletal: nontender, no swelling, no obvious deformity Extremities: no edema, no cyanosis, no clubbing Pulses: 2+ symmetric, upper and lower extremities, normal cap refill Neurological: alert, oriented x 3, left nasal fold flat with decreased sensation,  strength 4/5 upper extremities and lower extremities, sensation decreased to mid shin DTRs 2+ throughout, no cerebellar signs, walks with walker Psychiatric: normal affect, behavior normal, pleasant   Medicare Attestation I have personally reviewed: The patient's medical and social history Their use of alcohol, tobacco or illicit drugs Their current medications and supplements The patient's functional ability including ADLs,fall risks, home safety risks, cognitive, and hearing and visual impairment Diet and physical activities Evidence for depression or  mood disorders  The patient's weight, height, BMI, and visual acuity have been recorded in the chart.  I have made referrals, counseling, and provided education to the patient based on review of the above and I have provided the patient with a written personalized care plan for preventive services.     Vicie Mutters, PA-C   02/10/2015

## 2015-02-11 LAB — HEPATIC FUNCTION PANEL
ALBUMIN: 3.8 g/dL (ref 3.5–5.2)
ALK PHOS: 67 U/L (ref 39–117)
ALT: 12 U/L (ref 0–35)
AST: 16 U/L (ref 0–37)
BILIRUBIN TOTAL: 0.3 mg/dL (ref 0.2–1.2)
Bilirubin, Direct: 0.1 mg/dL (ref 0.0–0.3)
Indirect Bilirubin: 0.2 mg/dL (ref 0.2–1.2)
Total Protein: 6.8 g/dL (ref 6.0–8.3)

## 2015-02-11 LAB — URINALYSIS, ROUTINE W REFLEX MICROSCOPIC
Bilirubin Urine: NEGATIVE
Hgb urine dipstick: NEGATIVE
Ketones, ur: NEGATIVE mg/dL
LEUKOCYTES UA: NEGATIVE
Nitrite: NEGATIVE
PH: 5 (ref 5.0–8.0)
Protein, ur: NEGATIVE mg/dL
SPECIFIC GRAVITY, URINE: 1.017 (ref 1.005–1.030)
Urobilinogen, UA: 0.2 mg/dL (ref 0.0–1.0)

## 2015-02-11 LAB — LIPID PANEL
Cholesterol: 149 mg/dL (ref 0–200)
HDL: 57 mg/dL (ref >=46)
LDL Cholesterol: 64 mg/dL (ref 0–99)
Total CHOL/HDL Ratio: 2.6 Ratio
Triglycerides: 142 mg/dL (ref <150)
VLDL: 28 mg/dL (ref 0–40)

## 2015-02-11 LAB — BASIC METABOLIC PANEL WITH GFR
BUN: 17 mg/dL (ref 6–23)
CHLORIDE: 104 meq/L (ref 96–112)
CO2: 26 meq/L (ref 19–32)
Calcium: 9.2 mg/dL (ref 8.4–10.5)
Creat: 1.02 mg/dL (ref 0.50–1.10)
GFR, Est African American: 57 mL/min — ABNORMAL LOW
GFR, Est Non African American: 50 mL/min — ABNORMAL LOW
GLUCOSE: 224 mg/dL — AB (ref 70–99)
POTASSIUM: 4.4 meq/L (ref 3.5–5.3)
Sodium: 136 mEq/L (ref 135–145)

## 2015-02-11 LAB — TSH: TSH: 1.352 u[IU]/mL (ref 0.350–4.500)

## 2015-02-11 LAB — MICROALBUMIN / CREATININE URINE RATIO
CREATININE, URINE: 89.1 mg/dL
MICROALB/CREAT RATIO: 13.5 mg/g (ref 0.0–30.0)
Microalb, Ur: 1.2 mg/dL (ref <2.0)

## 2015-02-11 LAB — HEMOGLOBIN A1C
HEMOGLOBIN A1C: 6.8 % — AB (ref <5.7)
MEAN PLASMA GLUCOSE: 148 mg/dL — AB (ref <117)

## 2015-02-11 LAB — URINALYSIS, MICROSCOPIC ONLY
BACTERIA UA: NONE SEEN
Casts: NONE SEEN
Crystals: NONE SEEN
Squamous Epithelial / LPF: NONE SEEN

## 2015-02-11 LAB — INSULIN, FASTING: Insulin fasting, serum: 22.9 u[IU]/mL — ABNORMAL HIGH (ref 2.0–19.6)

## 2015-02-11 LAB — MAGNESIUM: MAGNESIUM: 1.4 mg/dL — AB (ref 1.5–2.5)

## 2015-02-19 ENCOUNTER — Encounter: Payer: Self-pay | Admitting: *Deleted

## 2015-02-20 ENCOUNTER — Other Ambulatory Visit: Payer: Self-pay | Admitting: Internal Medicine

## 2015-02-21 DIAGNOSIS — H43811 Vitreous degeneration, right eye: Secondary | ICD-10-CM | POA: Diagnosis not present

## 2015-02-21 DIAGNOSIS — H3532 Exudative age-related macular degeneration: Secondary | ICD-10-CM | POA: Diagnosis not present

## 2015-02-21 DIAGNOSIS — E11359 Type 2 diabetes mellitus with proliferative diabetic retinopathy without macular edema: Secondary | ICD-10-CM | POA: Diagnosis not present

## 2015-02-24 DIAGNOSIS — J310 Chronic rhinitis: Secondary | ICD-10-CM | POA: Diagnosis not present

## 2015-02-27 DIAGNOSIS — E119 Type 2 diabetes mellitus without complications: Secondary | ICD-10-CM | POA: Diagnosis not present

## 2015-02-27 DIAGNOSIS — H524 Presbyopia: Secondary | ICD-10-CM | POA: Diagnosis not present

## 2015-02-27 DIAGNOSIS — Z961 Presence of intraocular lens: Secondary | ICD-10-CM | POA: Diagnosis not present

## 2015-02-27 DIAGNOSIS — H3532 Exudative age-related macular degeneration: Secondary | ICD-10-CM | POA: Diagnosis not present

## 2015-03-02 ENCOUNTER — Telehealth: Payer: Self-pay

## 2015-03-02 NOTE — Telephone Encounter (Signed)
Received paper note from front office staff, patient wanted to know if there was a medication that she can take to help balance. Per Vicie Mutters, PA , I returned call to patient to advise there is no medication to prescribe, but she can cut Norvasc in half and maybe try physical therapy. Patient declines physical therapy at this time, she will cut Norvasc in half for now and see how that works will advise if physical therapy is needed.

## 2015-03-20 ENCOUNTER — Other Ambulatory Visit: Payer: Self-pay | Admitting: Internal Medicine

## 2015-03-27 ENCOUNTER — Encounter: Payer: Self-pay | Admitting: Internal Medicine

## 2015-03-27 ENCOUNTER — Ambulatory Visit: Payer: Medicare Other | Admitting: Internal Medicine

## 2015-03-27 VITALS — BP 142/68 | HR 72 | Temp 97.7°F | Resp 16 | Ht 63.0 in | Wt 139.8 lb

## 2015-03-27 DIAGNOSIS — T8189XS Other complications of procedures, not elsewhere classified, sequela: Secondary | ICD-10-CM

## 2015-03-27 NOTE — Progress Notes (Signed)
   Subjective:    Patient ID: Kylie Sanchez, female    DOB: 28-Jan-1925, 81 y.o.   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   Review of Systems     Objective:   Physical Exam  BP 142/68 mmHg  Pulse 72  Temp(Src) 97.7 F (36.5 C)  Resp 16  Ht 5\' 3"  (1.6 m)  Wt 139 lb 12.8 oz (63.413 kg)  BMI 24.77 kg/m2  There is a 6 mm stoma with smooth margins and a central  crusted "scab" type detritus occluding the depth of the opening and unable to determine the depth of the opening. No signs of cellulitis or drainage.     Assessment & Plan:    1)  Appears incompletely healed surgical wound site   - Recommend patient schedule to see her occuloplastic surgeon for evaluation and it's not apparent what the stoma articulates with.

## 2015-03-29 DIAGNOSIS — H3531 Nonexudative age-related macular degeneration: Secondary | ICD-10-CM | POA: Diagnosis not present

## 2015-03-29 DIAGNOSIS — Z961 Presence of intraocular lens: Secondary | ICD-10-CM | POA: Diagnosis not present

## 2015-05-03 DIAGNOSIS — J310 Chronic rhinitis: Secondary | ICD-10-CM | POA: Diagnosis not present

## 2015-05-03 DIAGNOSIS — C4491 Basal cell carcinoma of skin, unspecified: Secondary | ICD-10-CM | POA: Diagnosis not present

## 2015-05-05 DIAGNOSIS — J310 Chronic rhinitis: Secondary | ICD-10-CM | POA: Insufficient documentation

## 2015-05-22 DIAGNOSIS — H3532 Exudative age-related macular degeneration: Secondary | ICD-10-CM | POA: Diagnosis not present

## 2015-05-22 DIAGNOSIS — H43811 Vitreous degeneration, right eye: Secondary | ICD-10-CM | POA: Diagnosis not present

## 2015-05-22 DIAGNOSIS — E11359 Type 2 diabetes mellitus with proliferative diabetic retinopathy without macular edema: Secondary | ICD-10-CM | POA: Diagnosis not present

## 2015-05-24 ENCOUNTER — Other Ambulatory Visit: Payer: Self-pay | Admitting: Internal Medicine

## 2015-06-06 ENCOUNTER — Encounter: Payer: Self-pay | Admitting: Internal Medicine

## 2015-06-06 ENCOUNTER — Ambulatory Visit (INDEPENDENT_AMBULATORY_CARE_PROVIDER_SITE_OTHER): Payer: Medicare Other | Admitting: Internal Medicine

## 2015-06-06 VITALS — BP 140/62 | HR 74 | Temp 98.0°F | Resp 16 | Ht 63.0 in | Wt 142.0 lb

## 2015-06-06 DIAGNOSIS — Z5181 Encounter for therapeutic drug level monitoring: Secondary | ICD-10-CM | POA: Diagnosis not present

## 2015-06-06 DIAGNOSIS — I10 Essential (primary) hypertension: Secondary | ICD-10-CM

## 2015-06-06 DIAGNOSIS — I480 Paroxysmal atrial fibrillation: Secondary | ICD-10-CM

## 2015-06-06 DIAGNOSIS — Z79899 Other long term (current) drug therapy: Secondary | ICD-10-CM

## 2015-06-06 DIAGNOSIS — E785 Hyperlipidemia, unspecified: Secondary | ICD-10-CM

## 2015-06-06 DIAGNOSIS — E1121 Type 2 diabetes mellitus with diabetic nephropathy: Secondary | ICD-10-CM | POA: Diagnosis not present

## 2015-06-06 DIAGNOSIS — Z7901 Long term (current) use of anticoagulants: Secondary | ICD-10-CM | POA: Diagnosis not present

## 2015-06-06 DIAGNOSIS — E1129 Type 2 diabetes mellitus with other diabetic kidney complication: Secondary | ICD-10-CM | POA: Diagnosis not present

## 2015-06-06 DIAGNOSIS — E559 Vitamin D deficiency, unspecified: Secondary | ICD-10-CM | POA: Diagnosis not present

## 2015-06-06 LAB — BASIC METABOLIC PANEL WITH GFR
BUN: 29 mg/dL — ABNORMAL HIGH (ref 7–25)
CO2: 24 mEq/L (ref 20–31)
Calcium: 9.6 mg/dL (ref 8.6–10.4)
Chloride: 106 mEq/L (ref 98–110)
Creat: 1.17 mg/dL — ABNORMAL HIGH (ref 0.60–0.88)
GFR, EST AFRICAN AMERICAN: 48 mL/min — AB (ref >=60)
GFR, EST NON AFRICAN AMERICAN: 42 mL/min — AB (ref >=60)
GLUCOSE: 83 mg/dL (ref 65–99)
Potassium: 4.9 mEq/L (ref 3.5–5.3)
Sodium: 140 mEq/L (ref 135–146)

## 2015-06-06 LAB — LIPID PANEL
Cholesterol: 140 mg/dL (ref 125–200)
HDL: 66 mg/dL (ref >=46)
LDL Cholesterol: 50 mg/dL (ref <130)
TRIGLYCERIDES: 119 mg/dL (ref <150)
Total CHOL/HDL Ratio: 2.1 Ratio (ref <=5.0)
VLDL: 24 mg/dL (ref <30)

## 2015-06-06 LAB — MAGNESIUM: Magnesium: 1.9 mg/dL (ref 1.5–2.5)

## 2015-06-06 LAB — HEPATIC FUNCTION PANEL
ALT: 11 U/L (ref 6–29)
AST: 14 U/L (ref 10–35)
Albumin: 4 g/dL (ref 3.6–5.1)
Alkaline Phosphatase: 67 U/L (ref 33–130)
BILIRUBIN INDIRECT: 0.2 mg/dL (ref 0.2–1.2)
Bilirubin, Direct: 0.1 mg/dL (ref <=0.2)
Total Bilirubin: 0.3 mg/dL (ref 0.2–1.2)
Total Protein: 6.8 g/dL (ref 6.1–8.1)

## 2015-06-06 NOTE — Progress Notes (Signed)
Patient ID: Kylie Sanchez, female   DOB: Oct 06, 1925, 81 y.o.   MRN: [id number]  Assessment and Plan:  Hypertension:  -Continue medication -monitor blood pressure at home. -Continue DASH diet -Reminder to go to the ER if any CP, SOB, nausea, dizziness, severe HA, changes vision/speech, left arm numbness and tingling and jaw pain.  Cholesterol -Stop pravastatin, no longer a necessity at this age - Continue diet and exercise -Check cholesterol.   Diabetes with diabetic chronic kidney disease -Continue diet and exercise.  -Check A1C  Vitamin D Def -check level -continue medications.   Basal Cell Carcinoma -s/p surgery with fistula/stoma resulting -does not appear infected -may need some prosthesis for coverage.  Depression -patient does not currently wish to try medications or counseling -I encouraged patient to try to contact her church pastor for further support  Continue diet and meds as discussed. Further disposition pending results of labs. Discussed med's effects and SE's.    HPI 81 y.o. female  presents for 3 month follow up with hypertension, hyperlipidemia, diabetes and vitamin D deficiency.  Patient also has history of basal cell carcinoma with status post enucleation and also poor wound healing since her surgery in 11/29/14.     Her blood pressure has been controlled at home, today their BP is BP: 140/62 mmHg.She does not workout. She denies chest pain, shortness of breath, dizziness.   She is on cholesterol medication and denies myalgias. Her cholesterol is at goal. The cholesterol was:  02/10/2015: Cholesterol 149; HDL 57; LDL Cholesterol 64; Triglycerides 142   She has been working on diet and exercise for diabetes with diabetic chronic kidney disease, she is not on bASA, she is on ACE/ARB, and denies  foot ulcerations, hyperglycemia, hypoglycemia , increased appetite, nausea, paresthesia of the feet, polydipsia, polyuria, vomiting and weight loss. Last A1C was: 02/10/2015:  Hgb A1c MFr Bld 6.8*   Patient is on Vitamin D supplement. 11/08/2014: Vit D, 25-Hydroxy 44   Patient reports that she has been having a hard time with her vision since her surgery.  She has gone to see Dr. Baird Cancer and Dr. Katy Fitch and is now getting a new pair of glasses to help with her vision.  She reports that she has a hard time seeing close and far.    She reports that she still has some issues with the sinus tract from her previous surgery.  She reports that she is not due to see her surgeon and has not seen her surgeon since her last visit.    She reports that she has been taking her coumadin.  Has not hit her head, had any nosebleeds, or bloody stools.    Patient reports that she has some issues with some depression.  She reports that she does not do a whole lot of anything at this time.  She reports that she has people helping her with laundry and cleaning but she has not been doing any activities that she likes to do anymore.  She reports that she gets very teary eyed and upset very easily.  She reports that her sister has not been doing well either.      Current Medications:  Current Outpatient Prescriptions on File Prior to Visit  Medication Sig Dispense Refill  . amLODipine (NORVASC) 10 MG tablet Take 1 tablet (10 mg total) daily 90 tablet 3  . b complex vitamins tablet Take 1 tablet by mouth daily.    . Coenzyme Q10 (CO Q-10) 200 MG CAPS Take 1 capsule  by mouth daily.     Marland Kitchen glimepiride (AMARYL) 1 MG tablet TAKE 1 TABLET BY MOUTH EVERY DAY 90 tablet 0  . losartan (COZAAR) 100 MG tablet Take 1 tablet (100 mg total) by mouth daily. 30 tablet 11  . Magnesium 250 MG TABS Take 1 tablet by mouth daily.     . metFORMIN (GLUCOPHAGE) 500 MG tablet TAKE 2 TABLETS BY MOUTH TWICE DAILY 120 tablet 3  . metoprolol (TOPROL-XL) 200 MG 24 hr tablet TAKE 1 TABLET BY MOUTH EVERY DAY 90 tablet 0  . ONE TOUCH ULTRA TEST test strip USE ONE STRIP TO TEST BLOOD GLUCOSE TWICE DAILY 100 each 6  .  pravastatin (PRAVACHOL) 40 MG tablet TAKE ONE TABLET BY MOUTH ONCE DAILY AT BEDTIME FOR  CHOLESTEROL 90 tablet 1  . warfarin (COUMADIN) 5 MG tablet Take 1 tablet (5 mg total) by mouth one time only at 6 PM. Take as directed by Coumadin Clinic. 30 tablet 1   No current facility-administered medications on file prior to visit.   Medical History:  Past Medical History  Diagnosis Date  . Dizziness   . Cataract   . Paroxysmal atrial fibrillation   . Sick sinus syndrome   . Type II or unspecified type diabetes mellitus without mention of complication, not stated as uncontrolled   . Hyperlipidemia   . Hypertensive cardiovascular disease   . Retinopathy   . CKD (chronic kidney disease) stage 3, GFR 30-59 ml/min     due to DM   Allergies: No Known Allergies   Review of Systems:  Review of Systems  Constitutional: Negative for fever, chills and malaise/fatigue.  HENT: Negative for congestion, ear pain, nosebleeds and sore throat.   Eyes: Positive for blurred vision.  Respiratory: Negative for cough, shortness of breath and wheezing.   Cardiovascular: Negative for chest pain, palpitations and leg swelling.  Gastrointestinal: Negative for heartburn, diarrhea, constipation, blood in stool and melena.  Genitourinary: Negative.   Neurological: Negative for dizziness, sensory change, loss of consciousness and headaches.  Psychiatric/Behavioral: Negative for depression. The patient is not nervous/anxious and does not have insomnia.     Family history- Review and unchanged  Social history- Review and unchanged  Physical Exam: BP 140/62 mmHg  Pulse 74  Temp(Src) 98 F (36.7 C) (Temporal)  Resp 16  Ht 5\' 3"  (1.6 m)  Wt 142 lb (64.411 kg)  BMI 25.16 kg/m2 Wt Readings from Last 3 Encounters:  06/06/15 142 lb (64.411 kg)  03/27/15 139 lb 12.8 oz (63.413 kg)  02/10/15 142 lb (64.411 kg)   General Appearance: Well nourished well developed, non-toxic appearing, in no apparent  distress. Eyes: PERRLA, EOMs, conjunctiva no swelling or erythema.  Left eye enucleated ENT/Mouth: Ear canals clear with no erythema, swelling, or discharge.  TMs normal bilaterally, oropharynx clear, moist, with no exudate. Left sinus fistula/stoma to skin   Neck: Supple, thyroid normal, no JVD, no cervical adenopathy.  Respiratory: Respiratory effort normal, breath sounds clear A&P, no wheeze, rhonchi or rales noted.  No retractions, no accessory muscle usage Cardio: RRR with no MRGs. No noted edema.  Abdomen: Soft, + BS.  Non tender, no guarding, rebound, hernias, masses. Musculoskeletal: Full ROM, 5/5 strength, Ambulates with walker gait Skin: Warm, dry without rashes, lesions, ecchymosis.  Neuro: Awake and oriented X 3, Cranial nerves intact. No cerebellar symptoms.  Psych: normal affect, Insight and Judgment appropriate.    Starlyn Skeans, PA-C 11:08 AM [facility]

## 2015-06-06 NOTE — Patient Instructions (Signed)
You can stop taking pravachol or pravastatin.    Continue to take your other medications as prescribed.  You can continue your vitamins.  You can talk to Dr. Rayann Heman about potentially stopping your coumadin and see what he thinks.

## 2015-06-07 LAB — CBC WITH DIFFERENTIAL/PLATELET
BASOS PCT: 0 % (ref 0–1)
Basophils Absolute: 0 10*3/uL (ref 0.0–0.1)
EOS PCT: 3 % (ref 0–5)
Eosinophils Absolute: 0.2 10*3/uL (ref 0.0–0.7)
HCT: 34.1 % — ABNORMAL LOW (ref 36.0–46.0)
Hemoglobin: 11.6 g/dL — ABNORMAL LOW (ref 12.0–15.0)
Lymphocytes Relative: 32 % (ref 12–46)
Lymphs Abs: 1.9 10*3/uL (ref 0.7–4.0)
MCH: 30.3 pg (ref 26.0–34.0)
MCHC: 34 g/dL (ref 30.0–36.0)
MCV: 89 fL (ref 78.0–100.0)
MONOS PCT: 8 % (ref 3–12)
MPV: 8.5 fL — ABNORMAL LOW (ref 8.6–12.4)
Monocytes Absolute: 0.5 10*3/uL (ref 0.1–1.0)
NEUTROS PCT: 57 % (ref 43–77)
Neutro Abs: 3.4 10*3/uL (ref 1.7–7.7)
Platelets: 268 10*3/uL (ref 150–400)
RBC: 3.83 MIL/uL — ABNORMAL LOW (ref 3.87–5.11)
RDW: 14.1 % (ref 11.5–15.5)
WBC: 6 10*3/uL (ref 4.0–10.5)

## 2015-06-07 LAB — INSULIN, RANDOM: Insulin: 12.2 u[IU]/mL (ref 2.0–19.6)

## 2015-06-07 LAB — TSH: TSH: 1.774 u[IU]/mL (ref 0.350–4.500)

## 2015-06-07 LAB — HEMOGLOBIN A1C
Hgb A1c MFr Bld: 6.5 % — ABNORMAL HIGH (ref <5.7)
MEAN PLASMA GLUCOSE: 140 mg/dL — AB (ref <117)

## 2015-06-07 LAB — VITAMIN D 25 HYDROXY (VIT D DEFICIENCY, FRACTURES): Vit D, 25-Hydroxy: 41 ng/mL (ref 30–100)

## 2015-06-14 ENCOUNTER — Telehealth: Payer: Self-pay | Admitting: *Deleted

## 2015-06-14 ENCOUNTER — Other Ambulatory Visit: Payer: Self-pay | Admitting: Internal Medicine

## 2015-06-14 NOTE — Telephone Encounter (Signed)
Called the patient twice today in reference to her Coumadin/Warfarin Refill request.  The phone number listed does not have a voicemail and there was no answer.  She has not been seen in the [facility] clinic since March therefore cannot honor the refill request.  She will need to schedule an appointment for INR check. Will continue to try to call her back to update her with this information.

## 2015-06-20 ENCOUNTER — Other Ambulatory Visit: Payer: Self-pay | Admitting: Internal Medicine

## 2015-06-20 ENCOUNTER — Ambulatory Visit (INDEPENDENT_AMBULATORY_CARE_PROVIDER_SITE_OTHER): Payer: Medicare Other | Admitting: *Deleted

## 2015-06-20 DIAGNOSIS — Z5181 Encounter for therapeutic drug level monitoring: Secondary | ICD-10-CM | POA: Diagnosis not present

## 2015-06-20 DIAGNOSIS — I480 Paroxysmal atrial fibrillation: Secondary | ICD-10-CM

## 2015-06-20 LAB — POCT INR: INR: 1.3

## 2015-07-03 ENCOUNTER — Other Ambulatory Visit: Payer: Self-pay | Admitting: Internal Medicine

## 2015-07-03 MED ORDER — METOPROLOL SUCCINATE ER 200 MG PO TB24
200.0000 mg | ORAL_TABLET | Freq: Every day | ORAL | Status: DC
Start: 2015-07-03 — End: 2015-10-13

## 2015-07-10 ENCOUNTER — Other Ambulatory Visit: Payer: Self-pay | Admitting: Internal Medicine

## 2015-07-27 DIAGNOSIS — H3532 Exudative age-related macular degeneration: Secondary | ICD-10-CM | POA: Diagnosis not present

## 2015-07-27 DIAGNOSIS — H01003 Unspecified blepharitis right eye, unspecified eyelid: Secondary | ICD-10-CM | POA: Diagnosis not present

## 2015-07-27 DIAGNOSIS — Z961 Presence of intraocular lens: Secondary | ICD-10-CM | POA: Diagnosis not present

## 2015-07-27 DIAGNOSIS — E119 Type 2 diabetes mellitus without complications: Secondary | ICD-10-CM | POA: Diagnosis not present

## 2015-08-23 DIAGNOSIS — C4491 Basal cell carcinoma of skin, unspecified: Secondary | ICD-10-CM | POA: Diagnosis not present

## 2015-08-23 DIAGNOSIS — J310 Chronic rhinitis: Secondary | ICD-10-CM | POA: Diagnosis not present

## 2015-08-24 ENCOUNTER — Other Ambulatory Visit: Payer: Self-pay | Admitting: Physician Assistant

## 2015-08-24 ENCOUNTER — Other Ambulatory Visit: Payer: Self-pay | Admitting: Internal Medicine

## 2015-08-24 NOTE — Telephone Encounter (Signed)
Pt requesting refill

## 2015-08-28 ENCOUNTER — Telehealth: Payer: Self-pay | Admitting: Internal Medicine

## 2015-08-28 NOTE — Telephone Encounter (Signed)
Pt requesting a refill on Warfarin. Please advise

## 2015-08-28 NOTE — Telephone Encounter (Signed)
Pt has not been seen for INR check since August.  Was due to come back on 06/28/15.  Called patient to set up another appt.  She will come to the office tomorrow.  She states she has enough Coumadin to last her until then.  Will refill after her appt.

## 2015-08-29 ENCOUNTER — Ambulatory Visit (INDEPENDENT_AMBULATORY_CARE_PROVIDER_SITE_OTHER): Payer: Medicare Other | Admitting: Pharmacist

## 2015-08-29 DIAGNOSIS — I480 Paroxysmal atrial fibrillation: Secondary | ICD-10-CM

## 2015-08-29 DIAGNOSIS — Z5181 Encounter for therapeutic drug level monitoring: Secondary | ICD-10-CM

## 2015-08-29 LAB — POCT INR: INR: 1.4

## 2015-09-05 DIAGNOSIS — H53411 Scotoma involving central area, right eye: Secondary | ICD-10-CM | POA: Diagnosis not present

## 2015-09-05 DIAGNOSIS — E113413 Type 2 diabetes mellitus with severe nonproliferative diabetic retinopathy with macular edema, bilateral: Secondary | ICD-10-CM | POA: Diagnosis not present

## 2015-09-05 DIAGNOSIS — H43811 Vitreous degeneration, right eye: Secondary | ICD-10-CM | POA: Diagnosis not present

## 2015-09-18 ENCOUNTER — Ambulatory Visit (INDEPENDENT_AMBULATORY_CARE_PROVIDER_SITE_OTHER): Payer: Medicare Other | Admitting: *Deleted

## 2015-09-18 DIAGNOSIS — I480 Paroxysmal atrial fibrillation: Secondary | ICD-10-CM | POA: Diagnosis not present

## 2015-09-18 DIAGNOSIS — Z5181 Encounter for therapeutic drug level monitoring: Secondary | ICD-10-CM

## 2015-09-18 LAB — POCT INR: INR: 1.3

## 2015-09-25 ENCOUNTER — Ambulatory Visit (INDEPENDENT_AMBULATORY_CARE_PROVIDER_SITE_OTHER): Payer: Medicare Other | Admitting: *Deleted

## 2015-09-25 DIAGNOSIS — Z5181 Encounter for therapeutic drug level monitoring: Secondary | ICD-10-CM

## 2015-09-25 DIAGNOSIS — I480 Paroxysmal atrial fibrillation: Secondary | ICD-10-CM | POA: Diagnosis not present

## 2015-09-25 LAB — POCT INR: INR: 2

## 2015-09-26 ENCOUNTER — Encounter: Payer: Self-pay | Admitting: Internal Medicine

## 2015-09-26 ENCOUNTER — Ambulatory Visit (INDEPENDENT_AMBULATORY_CARE_PROVIDER_SITE_OTHER): Payer: Medicare Other | Admitting: Internal Medicine

## 2015-09-26 VITALS — BP 146/70 | HR 68 | Temp 97.3°F | Resp 16 | Ht 63.0 in | Wt 145.8 lb

## 2015-09-26 DIAGNOSIS — E1140 Type 2 diabetes mellitus with diabetic neuropathy, unspecified: Secondary | ICD-10-CM

## 2015-09-26 DIAGNOSIS — E785 Hyperlipidemia, unspecified: Secondary | ICD-10-CM

## 2015-09-26 DIAGNOSIS — I119 Hypertensive heart disease without heart failure: Secondary | ICD-10-CM | POA: Diagnosis not present

## 2015-09-26 DIAGNOSIS — E1121 Type 2 diabetes mellitus with diabetic nephropathy: Secondary | ICD-10-CM | POA: Diagnosis not present

## 2015-09-26 DIAGNOSIS — E1129 Type 2 diabetes mellitus with other diabetic kidney complication: Secondary | ICD-10-CM | POA: Diagnosis not present

## 2015-09-26 DIAGNOSIS — Z6825 Body mass index (BMI) 25.0-25.9, adult: Secondary | ICD-10-CM | POA: Diagnosis not present

## 2015-09-26 DIAGNOSIS — Z79899 Other long term (current) drug therapy: Secondary | ICD-10-CM | POA: Diagnosis not present

## 2015-09-26 DIAGNOSIS — E559 Vitamin D deficiency, unspecified: Secondary | ICD-10-CM | POA: Diagnosis not present

## 2015-09-26 LAB — CBC WITH DIFFERENTIAL/PLATELET
BASOS ABS: 0.1 10*3/uL (ref 0.0–0.1)
Basophils Relative: 1 % (ref 0–1)
EOS PCT: 4 % (ref 0–5)
Eosinophils Absolute: 0.3 10*3/uL (ref 0.0–0.7)
HEMATOCRIT: 36.4 % (ref 36.0–46.0)
HEMOGLOBIN: 12.3 g/dL (ref 12.0–15.0)
LYMPHS ABS: 2.2 10*3/uL (ref 0.7–4.0)
LYMPHS PCT: 33 % (ref 12–46)
MCH: 29.7 pg (ref 26.0–34.0)
MCHC: 33.8 g/dL (ref 30.0–36.0)
MCV: 87.9 fL (ref 78.0–100.0)
MONO ABS: 0.4 10*3/uL (ref 0.1–1.0)
MPV: 8.8 fL (ref 8.6–12.4)
Monocytes Relative: 6 % (ref 3–12)
Neutro Abs: 3.8 10*3/uL (ref 1.7–7.7)
Neutrophils Relative %: 56 % (ref 43–77)
Platelets: 309 10*3/uL (ref 150–400)
RBC: 4.14 MIL/uL (ref 3.87–5.11)
RDW: 13.7 % (ref 11.5–15.5)
WBC: 6.8 10*3/uL (ref 4.0–10.5)

## 2015-09-26 LAB — BASIC METABOLIC PANEL WITH GFR
BUN: 30 mg/dL — ABNORMAL HIGH (ref 7–25)
CO2: 25 mmol/L (ref 20–31)
Calcium: 9.5 mg/dL (ref 8.6–10.4)
Chloride: 105 mmol/L (ref 98–110)
Creat: 1.09 mg/dL — ABNORMAL HIGH (ref 0.60–0.88)
GFR, EST NON AFRICAN AMERICAN: 45 mL/min — AB (ref >=60)
GFR, Est African American: 52 mL/min — ABNORMAL LOW (ref >=60)
Glucose, Bld: 75 mg/dL (ref 65–99)
POTASSIUM: 5.4 mmol/L — AB (ref 3.5–5.3)
Sodium: 138 mmol/L (ref 135–146)

## 2015-09-26 LAB — HEPATIC FUNCTION PANEL
ALT: 11 U/L (ref 6–29)
AST: 14 U/L (ref 10–35)
Albumin: 3.8 g/dL (ref 3.6–5.1)
Alkaline Phosphatase: 79 U/L (ref 33–130)
Bilirubin, Direct: 0.1 mg/dL (ref <=0.2)
Indirect Bilirubin: 0.2 mg/dL (ref 0.2–1.2)
Total Bilirubin: 0.3 mg/dL (ref 0.2–1.2)
Total Protein: 6.9 g/dL (ref 6.1–8.1)

## 2015-09-26 LAB — HEMOGLOBIN A1C
HEMOGLOBIN A1C: 6.9 % — AB (ref <5.7)
MEAN PLASMA GLUCOSE: 151 mg/dL — AB (ref <117)

## 2015-09-26 LAB — LIPID PANEL
CHOLESTEROL: 173 mg/dL (ref 125–200)
HDL: 59 mg/dL (ref >=46)
LDL Cholesterol: 85 mg/dL (ref <130)
Total CHOL/HDL Ratio: 2.9 Ratio (ref <=5.0)
Triglycerides: 147 mg/dL (ref <150)
VLDL: 29 mg/dL (ref <30)

## 2015-09-26 LAB — MAGNESIUM: MAGNESIUM: 2 mg/dL (ref 1.5–2.5)

## 2015-09-26 LAB — TSH: TSH: 1.72 u[IU]/mL (ref 0.350–4.500)

## 2015-09-26 NOTE — Patient Instructions (Signed)

## 2015-09-26 NOTE — Progress Notes (Signed)
Patient ID: Kylie Sanchez, female   DOB: 1925/05/17, 81 y.o.   MRN: [id number]   This very nice 81 y.o. SWF presents for  follow up with Hypertension, Hyperlipidemia, Pre-Diabetes and Vitamin D Deficiency.    Patient is treated for HTN& HTHD with hx/o pAfib since 2011 & is on Coumadin & BP has been controlled at home.Patient does relate an episode about a week ago when she had rapid palpitations and tooks an extra 1/2 dose of her Toprol with HR slowing back to normal. Denied associated dyspnea, CP or Diaphoresis.  In June 2013 she had a permanent pacemaker placed for SSS.   Her coumadin is followed at the [facility]. Today's BP: (!) 146/70 mmHg. Patient has had no complaints of any cardiac type chest pain, palpitations, dyspnea/orthopnea/PND, dizziness, claudication, or dependent edema. Patient also relates having "Dark Stools" recently but no actual BRBlood.    Hyperlipidemia is controlled with diet.  Last Lipids were at goal with Cholesterol 140; HDL 66; LDL 50; Triglycerides 119 on 06/06/2015.    Also, the patient has history of T2_NIDDM w/Stage 3 CKD  and has had no symptoms of reactive hypoglycemia, diabetic polys, paresthesias or visual blurring.  Last A1c was 06/06/2015: Hgb A1c MFr Bld 6.5%     Further, the patient also has history of Vitamin D Deficiency and supplements vitamin D without any suspected side-effects. Last vitamin D was 41 on 06/06/2015.  Medication Sig  . amLODipine (NORVASC) 10 MG tablet Take 1 tablet (10 mg total) daily  . B complex vitamins tablet Take 1 tablet by mouth daily.  . Coenzyme Q10  200 MG Take 1 capsule by mouth daily.   Marland Kitchen glimepiride  1 MG tablet TAKE 1 TABLET BY MOUTH EVERY DAY  . losartan  100 MG tablet Take 1 tablet (100 mg total) by mouth daily.  . Magnesium 250 MG TABS Take 1 tablet by mouth daily.   . metFORMIN  500 MG tablet TAKE 2 TABLETS BY MOUTH TWICE DAILY  . metoprolol (TOPROL-XL) 200 MG  Take 1 tablet (200 mg total) by mouth daily.  Marland Kitchen  warfarin (COUMADIN) 5 MG tablet Take 1/2-1 tablet by mouth daily as directed by anticoagulation clinic   No Known Allergies  PMHx:   Past Medical History  Diagnosis Date  . Dizziness   . Cataract   . Paroxysmal atrial fibrillation (HCC)   . Sick sinus syndrome ([place])   . Type II or unspecified type diabetes mellitus without mention of complication, not stated as uncontrolled   . Hyperlipidemia   . Hypertensive cardiovascular disease   . Retinopathy   . CKD (chronic kidney disease) stage 3, GFR 30-59 ml/min     due to DM   Past Surgical History  Procedure Laterality Date  . Pacemaker insertion  04/20/12    MDT Adapta L implanted by Dr Rayann Heman  . Dental surgery    . Permanent pacemaker insertion N/A 04/20/2012    Procedure: PERMANENT PACEMAKER INSERTION;  Surgeon: Thompson Grayer, MD;  Location: [facility] CATH LAB;  Service: Cardiovascular;  Laterality: N/A;   FHx:    Reviewed / unchanged  SHx:    Reviewed / unchanged  Systems Review:  Constitutional: Denies fever, chills, wt changes, headaches, insomnia, fatigue, night sweats, change in appetite. Eyes: Denies redness, blurred vision, diplopia, discharge, itchy, watery eyes.  ENT: Denies discharge, congestion, post nasal drip, epistaxis, sore throat, earache, hearing loss, dental pain, tinnitus, vertigo, sinus pain, snoring.  CV: Denies chest pain, palpitations, irregular  heartbeat, syncope, dyspnea, diaphoresis, orthopnea, PND, claudication or edema. Respiratory: denies cough, dyspnea, DOE, pleurisy, hoarseness, laryngitis, wheezing.  Gastrointestinal: Denies dysphagia, odynophagia, heartburn, reflux, water brash, abdominal pain or cramps, nausea, vomiting, bloating, diarrhea, constipation, hematemesis, melena, hematochezia  or hemorrhoids. Genitourinary: Denies dysuria, frequency, urgency, nocturia, hesitancy, discharge, hematuria or flank pain. Musculoskeletal: Denies arthralgias, myalgias, stiffness, jt. swelling, pain, limping or  strain/sprain.  Skin: Denies pruritus, rash, hives, warts, acne, eczema or change in skin lesion(s). Neuro: No weakness, tremor, incoordination, spasms, paresthesia or pain. Psychiatric: Denies confusion, memory loss or sensory loss. Endo: Denies change in weight, skin or hair change.  Heme/Lymph: No excessive bleeding, bruising or enlarged lymph nodes.  Physical Exam  BP 146/70 mmHg  Pulse 68  Temp(Src) 97.3 F (36.3 C)  Resp 16  Ht 5\' 3"  (1.6 m)  Wt 145 lb 12.8 oz (66.134 kg)  BMI 25.83 kg/m2  Appears well nourished and in no distress. Eyes: PERRLA, EOMs, conjunctiva no swelling or erythema. Sinuses: No frontal/maxillary tenderness ENT/Mouth: EAC's clear, TM's nl w/o erythema, bulging. Nares clear w/o erythema, swelling, exudates. Oropharynx clear without erythema or exudates. Oral hygiene is good. Tongue normal, non obstructing. Hearing intact.  Neck: Supple. Thyroid nl. Car 2+/2+ without bruits, nodes or JVD. Chest: Respirations nl with BS clear & equal w/o rales, rhonchi, wheezing or stridor.  Cor: Heart sounds softw/ irregular rate and rhythm without sig. murmurs, gallops, clicks, or rubs. Peripheral pulses normal and equal  without edema.  Abdomen: Soft & bowel sounds normal. Non-tender w/o guarding, rebound, hernias, masses, or organomegaly.  Lymphatics: Unremarkable.  Musculoskeletal: Full ROM all peripheral extremities, joint stability, 5/5 strength, and normal gait.  Skin: Warm, dry without exposed rashes, lesions or ecchymosis apparent.  Neuro: Cranial nerves intact, reflexes equal bilaterally. Sensory-motor testing grossly intact. Tendon reflexes grossly intact.  Pysch: Alert & oriented x 3.  Insight and judgement nl & appropriate. No ideations.  Assessment and Plan:  1. Hypertensive heart disease without heart failure  - TSH  2. Hyperlipidemia  - Lipid panel - TSH  3. T2_NIDDM w/Stage 3 CKD (GFR 40 ml/min)  - Hemoglobin A1c - Insulin, random  4. Vitamin D  deficiency  - VITAMIN D 25 Hydroxy  5. Medication management  - CBC with Differential/Platelet - BASIC METABOLIC PANEL WITH GFR - Hepatic function panel - Magnesium  6. BMI 25.0-25.9,adult   7. Type 2 diabetes mellitus with diabetic neuropathy, without long-term current use of insulin ([place])   8. Dark [place] on Coumadin   -will check Hemoccult & Cologard.    Recommended regular exercise, BP monitoring, weight control, and discussed med and SE's. Recommended labs to assess and monitor clinical status. Further disposition pending results of labs. Over 30 minutes of exam, counseling, chart review was performed

## 2015-09-27 LAB — INSULIN, RANDOM: INSULIN: 10.9 u[IU]/mL (ref 2.0–19.6)

## 2015-09-27 LAB — VITAMIN D 25 HYDROXY (VIT D DEFICIENCY, FRACTURES): Vit D, 25-Hydroxy: 43 ng/mL (ref 30–100)

## 2015-10-02 ENCOUNTER — Encounter: Payer: PRIVATE HEALTH INSURANCE | Admitting: Internal Medicine

## 2015-10-11 ENCOUNTER — Ambulatory Visit (INDEPENDENT_AMBULATORY_CARE_PROVIDER_SITE_OTHER): Payer: Medicare Other | Admitting: Pharmacist

## 2015-10-11 ENCOUNTER — Encounter: Payer: Self-pay | Admitting: Internal Medicine

## 2015-10-11 ENCOUNTER — Ambulatory Visit (INDEPENDENT_AMBULATORY_CARE_PROVIDER_SITE_OTHER): Payer: Medicare Other | Admitting: Internal Medicine

## 2015-10-11 VITALS — BP 146/70 | HR 72 | Ht 62.0 in | Wt 145.2 lb

## 2015-10-11 DIAGNOSIS — I480 Paroxysmal atrial fibrillation: Secondary | ICD-10-CM | POA: Diagnosis not present

## 2015-10-11 DIAGNOSIS — Z5181 Encounter for therapeutic drug level monitoring: Secondary | ICD-10-CM | POA: Diagnosis not present

## 2015-10-11 DIAGNOSIS — I495 Sick sinus syndrome: Secondary | ICD-10-CM

## 2015-10-11 LAB — POCT INR: INR: 1.8

## 2015-10-11 NOTE — Progress Notes (Signed)
  Kylie Sanchez is a 81 y.o. female who presents today for routine electrophysiology followup.  Since last being seen in our clinic, the patient reports doing very well.   She has rare palpitations.  Today, she denies symptoms of palpitations, chest pain, shortness of breath, dizziness, presyncope, or syncope.  The patient is otherwise without complaint today.   Past Medical History  Diagnosis Date  . Dizziness   . Cataract   . Paroxysmal atrial fibrillation (HCC)   . Sick sinus syndrome ([place])   . Type II or unspecified type diabetes mellitus without mention of complication, not stated as uncontrolled   . Hyperlipidemia   . Hypertensive cardiovascular disease   . Retinopathy   . CKD (chronic kidney disease) stage 3, GFR 30-59 ml/min     due to DM   Past Surgical History  Procedure Laterality Date  . Pacemaker insertion  04/20/12    MDT Adapta L implanted by Dr Rayann Heman  . Dental surgery    . Permanent pacemaker insertion N/A 04/20/2012    Procedure: PERMANENT PACEMAKER INSERTION;  Surgeon: Thompson Grayer, MD;  Location: [facility] CATH LAB;  Service: Cardiovascular;  Laterality: N/A;    Current Outpatient Prescriptions  Medication Sig Dispense Refill  . amLODipine (NORVASC) 10 MG tablet Take 1 tablet (10 mg total) daily 90 tablet 3  . b complex vitamins tablet Take 1 tablet by mouth daily.    . Coenzyme Q10 (CO Q 10) 100 MG CAPS Take 1 capsule by mouth daily.    Marland Kitchen glimepiride (AMARYL) 1 MG tablet TAKE 1 TABLET BY MOUTH EVERY DAY 90 tablet 0  . losartan (COZAAR) 100 MG tablet Take 1 tablet (100 mg total) by mouth daily. 30 tablet 11  . Magnesium 250 MG TABS Take 1 tablet by mouth daily.     . metFORMIN (GLUCOPHAGE) 500 MG tablet TAKE 2 TABLETS BY MOUTH TWICE DAILY 360 tablet 2  . metoprolol (TOPROL-XL) 200 MG 24 hr tablet Take 1 tablet (200 mg total) by mouth daily. 90 tablet 0  . ONE TOUCH ULTRA TEST test strip USE ONE STRIP TO TEST BLOOD GLUCOSE TWICE DAILY 100 each 6  . warfarin (COUMADIN) 5 MG  tablet Take 1/2-1 tablet by mouth daily as directed by anticoagulation clinic 30 tablet 1   No current facility-administered medications for this visit.    Physical Exam: Filed Vitals:   10/11/15 1149  BP: 146/70  Pulse: 72  Height: 5\' 2"  (1.575 m)  Weight: 145 lb 3.2 oz (65.862 kg)    GEN- The patient is well appearing, alert and oriented x 3 today.   Head- normocephalic, atraumatic Eyes-  S/p L eye enucleation Ears- hearing intact Oropharynx- clear, area of necrotic ulceration near her L eye/ nosal region Lungs- Clear to ausculation bilaterally, normal work of breathing Chest- pacemaker pocket is well healed Heart- regular rate and rhythm, no murmurs, rubs or gallops, PMI not laterally displaced GI- soft, NT, ND, + BS Extremities- no clubbing, cyanosis, or edema  Pacemaker interrogation- reviewed in detail today,  See PACEART report  Assessment and Plan:  1. Symptomatic bradycardia Normal pacemaker function See Pace Art report No changes today  2. HTN Stable No change required today  3. afib Stable Compliance with INR checks encouraged today  carelink every 3 months Return to see the EP NP in 12 months

## 2015-10-11 NOTE — Patient Instructions (Signed)
Medication Instructions:  Your physician recommends that you continue on your current medications as directed. Please refer to the Current Medication list given to you today.   Labwork: None ordered   Testing/Procedures: None ordered   Follow-Up: Remote monitoring is used to monitor your Pacemaker from home. This monitoring reduces the number of office visits required to check your device to one time per year. It allows [place] to keep an eye on the functioning of your device to ensure it is working properly. You are scheduled for a device check from home on 01/10/16. You may send your transmission at any time that day. If you have a wireless device, the transmission will be sent automatically. After your physician reviews your transmission, you will receive a postcard with your next transmission date.   Your physician wants you to follow-up in: 12 months with Chanetta Marshall, NP You will receive a reminder letter in the mail two months in advance. If you don't receive a letter, please call our office to schedule the follow-up appointment.   Any Other Special Instructions Will Be Listed Below (If Applicable).     If you need a refill on your cardiac medications before your next appointment, please call your pharmacy.

## 2015-10-13 ENCOUNTER — Other Ambulatory Visit: Payer: Self-pay | Admitting: Internal Medicine

## 2015-10-17 ENCOUNTER — Other Ambulatory Visit: Payer: Self-pay | Admitting: *Deleted

## 2015-10-17 DIAGNOSIS — Z1212 Encounter for screening for malignant neoplasm of rectum: Secondary | ICD-10-CM

## 2015-10-17 LAB — POC HEMOCCULT BLD/STL (HOME/3-CARD/SCREEN)
FECAL OCCULT BLD: NEGATIVE
FECAL OCCULT BLD: NEGATIVE
Fecal Occult Blood, POC: NEGATIVE

## 2015-10-18 ENCOUNTER — Telehealth: Payer: Self-pay | Admitting: Cardiology

## 2015-10-18 NOTE — Telephone Encounter (Signed)
Called pt and made her aware that her home monitor has arrived and is ready for pick up. She verbalzied understanding and said she would come to the office tomorrow and pick it up.

## 2015-10-23 DIAGNOSIS — H53421 Scotoma of blind spot area, right eye: Secondary | ICD-10-CM | POA: Diagnosis not present

## 2015-10-23 DIAGNOSIS — H3521 Other non-diabetic proliferative retinopathy, right eye: Secondary | ICD-10-CM | POA: Diagnosis not present

## 2015-10-23 DIAGNOSIS — H5372 Impaired contrast sensitivity: Secondary | ICD-10-CM | POA: Diagnosis not present

## 2015-10-25 ENCOUNTER — Ambulatory Visit (INDEPENDENT_AMBULATORY_CARE_PROVIDER_SITE_OTHER): Payer: Medicare Other | Admitting: *Deleted

## 2015-10-25 DIAGNOSIS — Z5181 Encounter for therapeutic drug level monitoring: Secondary | ICD-10-CM | POA: Diagnosis not present

## 2015-10-25 DIAGNOSIS — I480 Paroxysmal atrial fibrillation: Secondary | ICD-10-CM

## 2015-10-25 LAB — POCT INR: INR: 1.8

## 2015-10-30 DIAGNOSIS — Z961 Presence of intraocular lens: Secondary | ICD-10-CM | POA: Insufficient documentation

## 2015-10-30 DIAGNOSIS — H53421 Scotoma of blind spot area, right eye: Secondary | ICD-10-CM | POA: Diagnosis not present

## 2015-10-30 DIAGNOSIS — H5372 Impaired contrast sensitivity: Secondary | ICD-10-CM | POA: Diagnosis not present

## 2015-10-30 DIAGNOSIS — H3530 Unspecified macular degeneration: Secondary | ICD-10-CM | POA: Insufficient documentation

## 2015-10-30 DIAGNOSIS — Z9001 Acquired absence of eye: Secondary | ICD-10-CM | POA: Insufficient documentation

## 2015-10-30 DIAGNOSIS — H3521 Other non-diabetic proliferative retinopathy, right eye: Secondary | ICD-10-CM | POA: Diagnosis not present

## 2015-11-02 LAB — CUP PACEART INCLINIC DEVICE CHECK
Battery Impedance: 208 Ohm
Battery Remaining Longevity: 135 mo
Battery Voltage: 2.78 V
Brady Statistic AP VP Percent: 0 %
Brady Statistic AS VP Percent: 0 %
Date Time Interrogation Session: 20161130180549
Implantable Lead Implant Date: 20130610
Implantable Lead Location: 753859
Implantable Lead Model: 5076
Implantable Lead Model: 5092
Lead Channel Impedance Value: 356 Ohm
Lead Channel Pacing Threshold Amplitude: 0.75 V
Lead Channel Pacing Threshold Pulse Width: 0.4 ms
Lead Channel Sensing Intrinsic Amplitude: 5.6 mV
Lead Channel Setting Pacing Amplitude: 2 V
Lead Channel Setting Pacing Amplitude: 2.5 V
Lead Channel Setting Sensing Sensitivity: 5.6 mV
MDC IDC LEAD IMPLANT DT: 20130610
MDC IDC LEAD LOCATION: 753860
MDC IDC MSMT LEADCHNL RV IMPEDANCE VALUE: 779 Ohm
MDC IDC MSMT LEADCHNL RV PACING THRESHOLD AMPLITUDE: 0.75 V
MDC IDC MSMT LEADCHNL RV PACING THRESHOLD PULSEWIDTH: 0.4 ms
MDC IDC MSMT LEADCHNL RV SENSING INTR AMPL: 31.36 mV
MDC IDC SET LEADCHNL RV PACING PULSEWIDTH: 0.4 ms
MDC IDC STAT BRADY AP VS PERCENT: 2 %
MDC IDC STAT BRADY AS VS PERCENT: 97 %

## 2015-11-08 ENCOUNTER — Ambulatory Visit (INDEPENDENT_AMBULATORY_CARE_PROVIDER_SITE_OTHER): Payer: Medicare Other | Admitting: Pharmacist

## 2015-11-08 ENCOUNTER — Other Ambulatory Visit: Payer: Self-pay | Admitting: Physician Assistant

## 2015-11-08 ENCOUNTER — Other Ambulatory Visit: Payer: Self-pay | Admitting: Internal Medicine

## 2015-11-08 DIAGNOSIS — I480 Paroxysmal atrial fibrillation: Secondary | ICD-10-CM | POA: Diagnosis not present

## 2015-11-08 DIAGNOSIS — Z5181 Encounter for therapeutic drug level monitoring: Secondary | ICD-10-CM

## 2015-11-08 LAB — POCT INR: INR: 2.1

## 2015-11-14 DIAGNOSIS — H3521 Other non-diabetic proliferative retinopathy, right eye: Secondary | ICD-10-CM | POA: Diagnosis not present

## 2015-11-14 DIAGNOSIS — H5372 Impaired contrast sensitivity: Secondary | ICD-10-CM | POA: Diagnosis not present

## 2015-11-14 DIAGNOSIS — H53421 Scotoma of blind spot area, right eye: Secondary | ICD-10-CM | POA: Diagnosis not present

## 2015-11-17 ENCOUNTER — Other Ambulatory Visit: Payer: Self-pay | Admitting: Internal Medicine

## 2015-11-17 ENCOUNTER — Other Ambulatory Visit: Payer: Self-pay

## 2015-11-23 DIAGNOSIS — H5372 Impaired contrast sensitivity: Secondary | ICD-10-CM | POA: Diagnosis not present

## 2015-11-23 DIAGNOSIS — H3521 Other non-diabetic proliferative retinopathy, right eye: Secondary | ICD-10-CM | POA: Diagnosis not present

## 2015-11-23 DIAGNOSIS — H53421 Scotoma of blind spot area, right eye: Secondary | ICD-10-CM | POA: Diagnosis not present

## 2015-11-29 ENCOUNTER — Ambulatory Visit (INDEPENDENT_AMBULATORY_CARE_PROVIDER_SITE_OTHER): Payer: Medicare Other | Admitting: Pharmacist

## 2015-11-29 DIAGNOSIS — I480 Paroxysmal atrial fibrillation: Secondary | ICD-10-CM | POA: Diagnosis not present

## 2015-11-29 DIAGNOSIS — Z5181 Encounter for therapeutic drug level monitoring: Secondary | ICD-10-CM | POA: Diagnosis not present

## 2015-11-29 LAB — POCT INR: INR: 2.4

## 2015-12-04 DIAGNOSIS — H2513 Age-related nuclear cataract, bilateral: Secondary | ICD-10-CM | POA: Diagnosis not present

## 2015-12-07 DIAGNOSIS — E113491 Type 2 diabetes mellitus with severe nonproliferative diabetic retinopathy without macular edema, right eye: Secondary | ICD-10-CM | POA: Diagnosis not present

## 2015-12-07 DIAGNOSIS — H353212 Exudative age-related macular degeneration, right eye, with inactive choroidal neovascularization: Secondary | ICD-10-CM | POA: Diagnosis not present

## 2015-12-22 ENCOUNTER — Other Ambulatory Visit: Payer: Self-pay | Admitting: Internal Medicine

## 2015-12-22 ENCOUNTER — Other Ambulatory Visit: Payer: Self-pay | Admitting: *Deleted

## 2015-12-22 MED ORDER — LOSARTAN POTASSIUM 100 MG PO TABS: 100.0000 mg | ORAL_TABLET | Freq: Every day | ORAL | Status: DC

## 2015-12-27 ENCOUNTER — Other Ambulatory Visit: Payer: Self-pay | Admitting: Internal Medicine

## 2015-12-28 ENCOUNTER — Ambulatory Visit (INDEPENDENT_AMBULATORY_CARE_PROVIDER_SITE_OTHER): Payer: Medicare Other | Admitting: Internal Medicine

## 2015-12-28 ENCOUNTER — Encounter: Payer: Self-pay | Admitting: Internal Medicine

## 2015-12-28 VITALS — BP 154/72 | HR 70 | Temp 97.8°F | Resp 16 | Ht 63.0 in | Wt 147.0 lb

## 2015-12-28 DIAGNOSIS — Z0000 Encounter for general adult medical examination without abnormal findings: Secondary | ICD-10-CM

## 2015-12-28 DIAGNOSIS — Z79899 Other long term (current) drug therapy: Secondary | ICD-10-CM

## 2015-12-28 DIAGNOSIS — R6889 Other general symptoms and signs: Secondary | ICD-10-CM | POA: Diagnosis not present

## 2015-12-28 DIAGNOSIS — I10 Essential (primary) hypertension: Secondary | ICD-10-CM | POA: Diagnosis not present

## 2015-12-28 DIAGNOSIS — E1129 Type 2 diabetes mellitus with other diabetic kidney complication: Secondary | ICD-10-CM | POA: Diagnosis not present

## 2015-12-28 DIAGNOSIS — I495 Sick sinus syndrome: Secondary | ICD-10-CM | POA: Diagnosis not present

## 2015-12-28 DIAGNOSIS — E559 Vitamin D deficiency, unspecified: Secondary | ICD-10-CM | POA: Diagnosis not present

## 2015-12-28 DIAGNOSIS — I119 Hypertensive heart disease without heart failure: Secondary | ICD-10-CM | POA: Diagnosis not present

## 2015-12-28 DIAGNOSIS — E1140 Type 2 diabetes mellitus with diabetic neuropathy, unspecified: Secondary | ICD-10-CM

## 2015-12-28 DIAGNOSIS — I480 Paroxysmal atrial fibrillation: Secondary | ICD-10-CM

## 2015-12-28 DIAGNOSIS — E785 Hyperlipidemia, unspecified: Secondary | ICD-10-CM

## 2015-12-28 DIAGNOSIS — Z0001 Encounter for general adult medical examination with abnormal findings: Secondary | ICD-10-CM | POA: Diagnosis not present

## 2015-12-28 DIAGNOSIS — Z7901 Long term (current) use of anticoagulants: Secondary | ICD-10-CM

## 2015-12-28 DIAGNOSIS — C4491 Basal cell carcinoma of skin, unspecified: Secondary | ICD-10-CM | POA: Diagnosis not present

## 2015-12-28 DIAGNOSIS — H3500 Unspecified background retinopathy: Secondary | ICD-10-CM | POA: Diagnosis not present

## 2015-12-28 DIAGNOSIS — Z6825 Body mass index (BMI) 25.0-25.9, adult: Secondary | ICD-10-CM

## 2015-12-28 DIAGNOSIS — E1121 Type 2 diabetes mellitus with diabetic nephropathy: Secondary | ICD-10-CM

## 2015-12-28 LAB — HEPATIC FUNCTION PANEL
ALK PHOS: 72 U/L (ref 33–130)
ALT: 13 U/L (ref 6–29)
AST: 16 U/L (ref 10–35)
Albumin: 3.9 g/dL (ref 3.6–5.1)
BILIRUBIN DIRECT: 0.1 mg/dL (ref <=0.2)
BILIRUBIN INDIRECT: 0.2 mg/dL (ref 0.2–1.2)
BILIRUBIN TOTAL: 0.3 mg/dL (ref 0.2–1.2)
Total Protein: 7 g/dL (ref 6.1–8.1)

## 2015-12-28 LAB — CBC WITH DIFFERENTIAL/PLATELET
BASOS ABS: 0 10*3/uL (ref 0.0–0.1)
Basophils Relative: 0 % (ref 0–1)
EOS ABS: 0.3 10*3/uL (ref 0.0–0.7)
EOS PCT: 4 % (ref 0–5)
HEMATOCRIT: 37.7 % (ref 36.0–46.0)
Hemoglobin: 12.3 g/dL (ref 12.0–15.0)
LYMPHS PCT: 31 % (ref 12–46)
Lymphs Abs: 2.1 10*3/uL (ref 0.7–4.0)
MCH: 29.6 pg (ref 26.0–34.0)
MCHC: 32.6 g/dL (ref 30.0–36.0)
MCV: 90.8 fL (ref 78.0–100.0)
MPV: 8.7 fL (ref 8.6–12.4)
Monocytes Absolute: 0.5 10*3/uL (ref 0.1–1.0)
Monocytes Relative: 7 % (ref 3–12)
NEUTROS PCT: 58 % (ref 43–77)
Neutro Abs: 3.9 10*3/uL (ref 1.7–7.7)
PLATELETS: 308 10*3/uL (ref 150–400)
RBC: 4.15 MIL/uL (ref 3.87–5.11)
RDW: 13.5 % (ref 11.5–15.5)
WBC: 6.7 10*3/uL (ref 4.0–10.5)

## 2015-12-28 LAB — BASIC METABOLIC PANEL WITH GFR
BUN: 27 mg/dL — ABNORMAL HIGH (ref 7–25)
CHLORIDE: 105 mmol/L (ref 98–110)
CO2: 26 mmol/L (ref 20–31)
Calcium: 9.6 mg/dL (ref 8.6–10.4)
Creat: 1.17 mg/dL — ABNORMAL HIGH (ref 0.60–0.88)
GFR, EST AFRICAN AMERICAN: 48 mL/min — AB (ref >=60)
GFR, EST NON AFRICAN AMERICAN: 42 mL/min — AB (ref >=60)
Glucose, Bld: 78 mg/dL (ref 65–99)
POTASSIUM: 5.2 mmol/L (ref 3.5–5.3)
Sodium: 136 mmol/L (ref 135–146)

## 2015-12-28 LAB — HEMOGLOBIN A1C
Hgb A1c MFr Bld: 6.5 % — ABNORMAL HIGH (ref <5.7)
Mean Plasma Glucose: 140 mg/dL — ABNORMAL HIGH (ref <117)

## 2015-12-28 LAB — LIPID PANEL
CHOL/HDL RATIO: 2.9 ratio (ref <=5.0)
CHOLESTEROL: 184 mg/dL (ref 125–200)
HDL: 63 mg/dL (ref >=46)
LDL Cholesterol: 86 mg/dL (ref <130)
TRIGLYCERIDES: 176 mg/dL — AB (ref <150)
VLDL: 35 mg/dL — AB (ref <30)

## 2015-12-28 NOTE — Progress Notes (Signed)
Patient ID: Kylie Sanchez, female   DOB: 08/12/1925, 82 y.o.   MRN: [id number]  MEDICARE ANNUAL WELLNESS VISIT AND FOLLOW UP  Assessment:    1. T2_NIDDM w/Stage 3 CKD (GFR 40 ml/min) -cont meds -appears to be well controlled by report but will check with medication - Hemoglobin A1c  2. Type 2 diabetes mellitus with diabetic neuropathy, without long-term current use of insulin (HCC) -cont meds - Hemoglobin A1c  3. Hyperlipidemia -cont meds - Lipid panel  4. Vitamin D deficiency -cont supplement  5. Medication management  - CBC with Differential/Platelet - BASIC METABOLIC PANEL WITH GFR - Hepatic function panel  6. PAF (paroxysmal atrial fibrillation) (HCC) -cont coumadin -followed by cards  7. Hypertensive heart disease without heart failure -cont meds -dash diet -well controlled  8. Tachycardia-bradycardia ([place]) -followed by cards  9. Sick sinus syndrome ([place]) -followed by cards  10. Essential hypertension -cont meds -cont dash diet -followed by cards  11. Paroxysmal atrial fibrillation (HCC) -cont meds -followed by cards  12. Basal cell carcinoma -followed by derm -has regular follow-up  13. Long term current use of anticoagulant therapy -followed by cards  14. Retinopathy -followed by Dr. Gershon Crane  15. BMI 25.0-25.9,adult -cont diet and exercise    Over 30 minutes of exam, counseling, chart review, and critical decision making was performed  Plan:   During the course of the visit the patient was educated and counseled about appropriate screening and preventive services including:    Pneumococcal vaccine   Influenza vaccine  Td vaccine  Prevnar 13  Screening electrocardiogram  Screening mammography  Bone densitometry screening  Colorectal cancer screening  Diabetes screening  Glaucoma screening  Nutrition counseling   Advanced directives: given info/requested copies  Conditions/risks identified: Diabetes is at goal,  ACE/ARB therapy: Yes. Urinary Incontinence is an issue: discussed non pharmacology and pharmacology options.  Fall risk: moderate- discussed PT, home fall assessment, medications.    Subjective:   Kylie Sanchez is a 82 y.o. female who presents for Medicare Annual Wellness Visit and 3 month follow up on hypertension, diabetes, hyperlipidemia, vitamin D def.  Date of last medicare wellness visit is unknown.   Her blood pressure has been controlled at home, today their BP is BP: (!) 154/72 mmHg She does workout. She denies chest pain, shortness of breath, dizziness.  She is not on cholesterol medication and denies myalgias. Her cholesterol is at goal. The cholesterol last visit was:   Lab Results  Component Value Date   CHOL 173 09/26/2015   HDL 59 09/26/2015   LDLCALC 85 09/26/2015   TRIG 147 09/26/2015   CHOLHDL 2.9 09/26/2015   She has been working on diet and exercise for diabetes, and denies foot ulcerations, hyperglycemia, hypoglycemia , increased appetite, nausea, paresthesia of the feet, polydipsia, polyuria, visual disturbances, vomiting and weight loss. Last A1C in the office was:  Lab Results  Component Value Date   HGBA1C 6.9* 09/26/2015  She reports that she doesn't check sugars on a regular basis, but last reading was 95-120.    Last GFR NonAA   Lab Results  Component Value Date   GFRNONAA 45* 09/26/2015   AA  Lab Results  Component Value Date   GFRAA 52* 09/26/2015   Patient is on Vitamin D supplement. Lab Results  Component Value Date   VD25OH 43 09/26/2015      Medication Review Current Outpatient Prescriptions on File Prior to Visit  Medication Sig Dispense Refill  . amLODipine (NORVASC) 10 MG tablet  TAKE ONE TABLET BY MOUTH EVERY DAY 90 tablet 2  . b complex vitamins tablet Take 1 tablet by mouth daily.    . Coenzyme Q10 (CO Q 10) 100 MG CAPS Take 1 capsule by mouth daily.    Marland Kitchen glimepiride (AMARYL) 1 MG tablet TAKE 1 TABLET BY MOUTH EVERY DAY 90 tablet 0   . losartan (COZAAR) 100 MG tablet Take 1 tablet (100 mg total) by mouth daily. 30 tablet 10  . Magnesium 250 MG TABS Take 1 tablet by mouth daily.     . metFORMIN (GLUCOPHAGE) 500 MG tablet TAKE 2 TABLETS BY MOUTH TWICE DAILY 120 tablet 0  . metoprolol (TOPROL-XL) 200 MG 24 hr tablet TAKE 1 TABLET(200 MG) BY MOUTH DAILY 90 tablet 1  . ONE TOUCH ULTRA TEST test strip USE ONE STRIP TO TEST BLOOD GLUCOSE TWICE DAILY 100 each 6  . warfarin (COUMADIN) 5 MG tablet TAKE 1/2 TO 1 TABLET BY MOUTH ONCE DAILY AS DIRECTED 30 tablet 3   No current facility-administered medications on file prior to visit.    Current Problems (verified) Patient Active Problem List   Diagnosis Date Noted  . BMI 25.0-25.9,adult 09/26/2015  . Basal cell carcinoma 02/10/2015  . DM neuropathy, type II diabetes mellitus ([place]) 02/10/2015  . Atrial fibrillation [I48.91] 02/03/2015  . HTN (hypertension) 11/08/2014  . T2_NIDDM w/Stage 3 CKD (GFR 40 ml/min) 11/08/2014  . Vitamin D deficiency 11/08/2014  . Medication management 11/08/2014  . Hyperlipidemia   . Sick sinus syndrome ([place])   . Retinopathy   . Long term current use of anticoagulant therapy 04/30/2012  . Tachycardia-bradycardia ([place]) 04/19/2012  . PAF (paroxysmal atrial fibrillation) ([place]) 04/18/2011  . Hypertensive cardiovascular disease 04/18/2011    Screening Tests  There is no immunization history on file for this patient.  Preventative care: Last colonoscopy: never had one, delcined Last mammogram: Never DEXA:2015  Prior vaccinations: TD or Tdap: Declined  Influenza: Declined Pneumococcal: Declined Prevnar13: Declined Shingles/Zostavax: Declined  Names of Other Physician/Practitioners you currently use: 1. [facility]- here for primary care 2. Dr. Baird Cancer, eye doctor, last visit 2016, due for visit in April 3. Does not go, dentist, last visit two years ago  Patient Care Team: Unk Pinto, MD as PCP -  General (Internal Medicine) Sherlynn Stalls, MD as Consulting Physician (Ophthalmology)  Past Surgical History  Procedure Laterality Date  . Pacemaker insertion  04/20/12    MDT Adapta L implanted by Dr Rayann Heman  . Dental surgery    . Permanent pacemaker insertion N/A 04/20/2012    Procedure: PERMANENT PACEMAKER INSERTION;  Surgeon: Thompson Grayer, MD;  Location: [facility] CATH LAB;  Service: Cardiovascular;  Laterality: N/A;   Family History  Problem Relation Age of Onset  . Heart attack Father   . Hypertension Mother   . Cancer Other    Social History  Substance Use Topics  . Smoking status: Never Smoker   . Smokeless tobacco: None  . Alcohol Use: No    MEDICARE WELLNESS OBJECTIVES: Tobacco use: She does not smoke.  Patient is not a former smoker. If yes, counseling given Alcohol Current alcohol use: none Osteoporosis: postmenopausal estrogen deficiency, History of fracture in the past year: no Fall risk: Moderate Risk, has had left eye enucleation Hearing: normal Visual acuity: impaired,  does perform annual eye exam Diet: well balanced Physical activity: Current Exercise Habits:: The patient does not participate in regular exercise at present Cardiac risk factors: Cardiac Risk Factors include: advanced age (>  1men, >65 women);diabetes mellitus;dyslipidemia;female gender;hypertension;sedentary lifestyle Depression/mood screen:   Depression screen [facility] 2/9 12/28/2015  Decreased Interest 0  Down, Depressed, Hopeless 0  PHQ - 2 Score 0  Altered sleeping 0  Tired, decreased energy 0  Change in appetite 0  Feeling bad or failure about yourself  0  Trouble concentrating 0  Moving slowly or fidgety/restless 0  Suicidal thoughts 0  PHQ-9 Score 0  Difficult doing work/chores Not difficult at all    ADLs:  In your present state of health, do you have any difficulty performing the following activities: 12/28/2015 09/26/2015  Hearing? N N  Vision? Y Y  Difficulty concentrating or making  decisions? N N  Walking or climbing stairs? N N  Dressing or bathing? N N  Doing errands, shopping? N N  Preparing Food and eating ? N -  Using the Toilet? N -  In the past six months, have you accidently leaked urine? N -  Do you have problems with loss of bowel control? N -  Managing your Medications? N -  Managing your Finances? N -  Housekeeping or managing your Housekeeping? N -     Cognitive Testing  Alert? Yes  Normal Appearance?Yes  Oriented to person? Yes  Place? Yes   Time? Yes  Recall of three objects?  Yes  Can perform simple calculations? Yes  Displays appropriate judgment?Yes  Can read the correct time from a watch face?Yes  EOL planning: Does patient have an advance directive?: No Would patient like information on creating an advanced directive?: Yes - Educational materials given   Objective:   Today's Vitals   12/28/15 1007  BP: 154/72  Pulse: 70  Temp: 97.8 F (36.6 C)  TempSrc: Temporal  Resp: 16  Height: 5\' 3"  (1.6 m)  Weight: 147 lb (66.679 kg)   Body mass index is 26.05 kg/(m^2).  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  No discharge noted.  Bandaid in place.   Oral cavity: MMM, no lesions Neck: supple, no lymphadenopathy, no thyromegaly, no masses Heart: RRR, normal S1, S2, no murmurs Lungs: CTA bilaterally, no wheezes, rhonchi, or rales Abdomen: +bs, soft, non tender, non distended, no masses, no hepatomegaly, no splenomegaly Musculoskeletal: nontender, no swelling, no obvious deformity Extremities: no edema, no cyanosis, no clubbing Pulses: 2+ symmetric, upper and lower extremities, normal cap refill Neurological: alert, oriented x 3, CN2-12 intact, strength normal upper extremities and lower extremities, sensation normal throughout, DTRs  2+ throughout, no cerebellar signs, gait normal Psychiatric: normal affect, behavior normal, pleasant  Breast: defer Gyn: defer Rectal: defer   Medicare Attestation I have personally reviewed: The patient's medical and social history Their use of alcohol, tobacco or illicit drugs Their current medications and supplements The patient's functional ability including ADLs,fall risks, home safety risks, cognitive, and hearing and visual impairment Diet and physical activities Evidence for depression or mood disorders  The patient's weight, height, BMI, and visual acuity have been recorded in the chart.  I have made referrals, counseling, and provided education to the patient based on review of the above and I have provided the patient with a written personalized care plan for preventive services.     Starlyn Skeans, PA-C   12/28/2015

## 2016-01-10 ENCOUNTER — Telehealth: Payer: Self-pay | Admitting: Cardiology

## 2016-01-10 ENCOUNTER — Encounter: Payer: Medicare Other | Admitting: *Deleted

## 2016-01-10 NOTE — Telephone Encounter (Signed)
Spoke with pt and reminded pt of remote transmission that is due today. Pt verbalized understanding.   

## 2016-01-26 DIAGNOSIS — J310 Chronic rhinitis: Secondary | ICD-10-CM | POA: Diagnosis not present

## 2016-01-26 DIAGNOSIS — C4491 Basal cell carcinoma of skin, unspecified: Secondary | ICD-10-CM | POA: Diagnosis not present

## 2016-01-26 DIAGNOSIS — C760 Malignant neoplasm of head, face and neck: Secondary | ICD-10-CM | POA: Diagnosis not present

## 2016-02-15 ENCOUNTER — Encounter: Payer: Self-pay | Admitting: Physician Assistant

## 2016-02-16 ENCOUNTER — Ambulatory Visit (INDEPENDENT_AMBULATORY_CARE_PROVIDER_SITE_OTHER): Payer: Medicare Other | Admitting: *Deleted

## 2016-02-16 DIAGNOSIS — I480 Paroxysmal atrial fibrillation: Secondary | ICD-10-CM

## 2016-02-16 DIAGNOSIS — Z5181 Encounter for therapeutic drug level monitoring: Secondary | ICD-10-CM

## 2016-02-16 LAB — POCT INR: INR: 1.2

## 2016-03-07 ENCOUNTER — Other Ambulatory Visit: Payer: Self-pay | Admitting: Physician Assistant

## 2016-03-26 ENCOUNTER — Encounter: Payer: Self-pay | Admitting: Physician Assistant

## 2016-03-28 DIAGNOSIS — M545 Low back pain: Secondary | ICD-10-CM | POA: Diagnosis not present

## 2016-04-18 ENCOUNTER — Telehealth: Payer: Self-pay | Admitting: Internal Medicine

## 2016-04-18 ENCOUNTER — Other Ambulatory Visit: Payer: Self-pay | Admitting: Internal Medicine

## 2016-04-18 NOTE — Telephone Encounter (Signed)
Returned call to patient and have advised her to call her PCP if she feels she is having left sided weakness on and off.  She has bad back problems too.  She is going to follow up with her PCP

## 2016-04-18 NOTE — Telephone Encounter (Signed)
New Message  Pt c/o medication issue:  1. Name of Medication: Cyclobencaprine and Tizanidine  2. How are you currently taking this medication (dosage and times per day)?Cyclob. 5mg .... Tizan. 2mg .... Pt never took either med  3. Are you having a reaction (difficulty breathing--STAT)? NO Side affect says possible dizziness   4. What is your medication issue? Pt having back pain since Easter. Pt states she got an Xray and was recommended to exercise. Pt was prescribed cyclobencaprine and tizanidine but the side affects stated could cause dizziness; pt never started either med. Pt wants to know could her intense back pain cause a stroke? Please call back to discuss

## 2016-05-08 ENCOUNTER — Encounter: Payer: Self-pay | Admitting: Physician Assistant

## 2016-05-08 ENCOUNTER — Ambulatory Visit (INDEPENDENT_AMBULATORY_CARE_PROVIDER_SITE_OTHER): Payer: Medicare Other | Admitting: Physician Assistant

## 2016-05-08 VITALS — BP 140/80 | HR 88 | Temp 97.7°F | Resp 14 | Ht 63.0 in | Wt 145.2 lb

## 2016-05-08 DIAGNOSIS — I480 Paroxysmal atrial fibrillation: Secondary | ICD-10-CM

## 2016-05-08 DIAGNOSIS — E1140 Type 2 diabetes mellitus with diabetic neuropathy, unspecified: Secondary | ICD-10-CM | POA: Diagnosis not present

## 2016-05-08 DIAGNOSIS — I208 Other forms of angina pectoris: Secondary | ICD-10-CM | POA: Diagnosis not present

## 2016-05-08 DIAGNOSIS — R2689 Other abnormalities of gait and mobility: Secondary | ICD-10-CM | POA: Diagnosis not present

## 2016-05-08 DIAGNOSIS — L89322 Pressure ulcer of left buttock, stage 2: Secondary | ICD-10-CM

## 2016-05-08 DIAGNOSIS — M545 Low back pain, unspecified: Secondary | ICD-10-CM

## 2016-05-08 DIAGNOSIS — Z79899 Other long term (current) drug therapy: Secondary | ICD-10-CM | POA: Diagnosis not present

## 2016-05-08 DIAGNOSIS — E1121 Type 2 diabetes mellitus with diabetic nephropathy: Secondary | ICD-10-CM | POA: Diagnosis not present

## 2016-05-08 DIAGNOSIS — I495 Sick sinus syndrome: Secondary | ICD-10-CM | POA: Diagnosis not present

## 2016-05-08 DIAGNOSIS — E785 Hyperlipidemia, unspecified: Secondary | ICD-10-CM

## 2016-05-08 LAB — CBC WITH DIFFERENTIAL/PLATELET
BASOS ABS: 0 {cells}/uL (ref 0–200)
Basophils Relative: 0 %
Eosinophils Absolute: 240 cells/uL (ref 15–500)
Eosinophils Relative: 4 %
HEMATOCRIT: 34.7 % — AB (ref 35.0–45.0)
HEMOGLOBIN: 11.5 g/dL — AB (ref 11.7–15.5)
LYMPHS ABS: 1620 {cells}/uL (ref 850–3900)
Lymphocytes Relative: 27 %
MCH: 29.9 pg (ref 27.0–33.0)
MCHC: 33.1 g/dL (ref 32.0–36.0)
MCV: 90.1 fL (ref 80.0–100.0)
MONO ABS: 360 {cells}/uL (ref 200–950)
MPV: 8.8 fL (ref 7.5–12.5)
Monocytes Relative: 6 %
NEUTROS PCT: 63 %
Neutro Abs: 3780 cells/uL (ref 1500–7800)
Platelets: 328 10*3/uL (ref 140–400)
RBC: 3.85 MIL/uL (ref 3.80–5.10)
RDW: 13.9 % (ref 11.0–15.0)
WBC: 6 10*3/uL (ref 3.8–10.8)

## 2016-05-08 MED ORDER — NITROGLYCERIN 0.4 MG SL SUBL: 0.4000 mg | SUBLINGUAL_TABLET | SUBLINGUAL | Status: DC | PRN

## 2016-05-08 NOTE — Patient Instructions (Addendum)
Add zinc and vitamin C per bottle instructions  Nitroglycerin sublingual tablets  You can take this medication if you have chest pain.  It relaxes blood vessels, increasing the blood and oxygen supply to your heart.  At the first sign of chest pain or tightness place one tablet under your tongue.  Please be sitting down when taking this medication because it can cause dizziness and low blood pressure.   Follow the directions on the prescription label.  -Let the tablet dissolve under the tongue. Do not swallow whole.  -Replace the dose if you accidentally swallow it. It will help if your mouth is not dry.  -Do not eat or drink, smoke or chew tobacco while a tablet is dissolving.   *If you are not better within 5 minutes after taking ONE dose of nitroglycerin, call 9-1-1 immediately to seek emergency medical care*.  *Do not take more than 3 nitroglycerin tablets over 15 minutes*.  If you take this medicine often to relieve symptoms of angina, your doctor or health care professional may provide you with different instructions to manage your symptoms.  If symptoms do not go away after following these instructions, it is important to call 9-1-1 immediately. Do not take more than 3 nitroglycerin tablets over 15 minutes.   The tablets are only good for 3 months once the container has been opened.  If the container has never been opened, then they are good for 3 years.  One way to tell if they are good is to see if you notice a sting when placed under the tongue (ONLY TEST WHEN NEEDED).  If there is NOT a sting, then they are no longer good.  If you have any doubt, then please request a refill or call 911 if you are having chest pain.   Pressure Injury A pressure injury, sometimes called a bedsore, is an injury to the skin and underlying tissue caused by pressure. Pressure on blood vessels causes decreased blood flow to the skin, which can eventually cause the skin tissue to die and break down into  a wound. Pressure injuries usually occur:  Over bony parts of the body such as the tailbone, shoulders, elbows, hips, and heels.  Under medical devices such as respiratory equipment, stockings, tubes, and splints. Pressure injuries start as reddened areas on the skin and can lead to pain, muscle damage, and infection. Pressure injuries can vary in severity.  CAUSES Pressure injuries are caused by a lack of blood supply to an area of skin. They can occur from intense pressure over a short period of time or from less intense pressure over a long period of time. RISK FACTORS This condition is more likely to develop in people who:  Are in the hospital or an extended care facility.  Are bedridden or in a wheelchair.  Have an injury or disease that keeps them from:  Moving normally.  Feeling pain or pressure.  Have a condition that:  Makes them sleepy or less alert.  Causes poor blood flow.  Need to wear a medical device.  Have poor control of their bladder or bowel functions (incontinence).  Have poor nutrition (malnutrition).  Are of certain ethnicities. People of African American and Latino or Hispanic descent are at higher risk compared to other ethnic groups. If you are at risk for pressure ulcers, your health care provider may recommend certain types of bedding to help prevent them. These may include foam or gel mattresses covered with one of the following:  A sheepskin blanket.  A pad that is filled with gel, air, water, or foam. SYMPTOMS  The main symptom is a blister or change in skin color that opens into a wound. Other symptoms include:   Red or dark areas of skin that do not turn white or pale when pressed with a finger.   Pain, warmth, or change of skin texture.  DIAGNOSIS This condition is diagnosed with a medical history and physical exam. You may also have tests, including:   Blood tests to check for infection or signs of poor nutrition.  Imaging studies  to check for damage to the deep tissues under your skin.  Blood flow studies. Your pressure injury will be staged to determine its severity. Staging is an assessment of:  The depth of the pressure injury.  Which tissues are exposed because of the pressure injury.  The causes of the pressure injury. TREATMENT The main focus of treatment is to help your injury heal. This may be done by:   Relieving or redistributing pressure on your skin. This includes:  Frequently changing your position.  Eliminating or minimizing positions that caused the wound or that can make the wound worse.  Using specific bed mattresses and chair cushions.  Refitting, resizing, or replacing any medical devices, or padding the skin under them.  Using creams or powders to prevent rubbing (friction) on the skin.  Keeping your skin clean and dry. This may include using a skin cleanser or skin protectant as told by your health care provider. This may be a lotion, ointment, or spray.  Cleaning your injury and removing any dead tissue from the wound (debridement).  Placing a bandage (dressing) over your injury.  Preventing or treating infection. This may include antibiotic, antimicrobial, or antiseptic medicines. Treatment may also include medicine for pain. Sometimes surgery is needed to close the wound with a flap of healthy skin or a piece of skin from another area of your body (graft). You may need surgery if other treatments are not working or if your injury is very deep. HOME CARE INSTRUCTIONS Wound Care  Follow instructions from your health care provider about:  How to take care of your wound.  When and how you should change your dressing.  When you should remove your dressing. If your dressing is dry and stuck when you try to remove it, moisten or wet the dressing with saline or water so that it can be removed without harming your skin or wound tissue.  Check your wound every day for signs of  infection. Have a caregiver do this for you if you are not able. Watch for:  More redness, swelling, or pain.  More fluid, blood, or pus.  A bad smell. Skin Care  Keep your skin clean and dry. Gently pat your skin dry.  Do not rub or massage your skin.  Use a skin protectant only as told by your health care provider.  Check your skin every day for any changes in color or any new blisters or sores (ulcers). Have a caregiver do this for you if you are not able. Medicines  Take over-the-counter and prescription medicines only as told by your health care provider.  If you were prescribed an antibiotic medicine, take it or apply it as told by your health care provider. Do not stop taking or using the antibiotic even if your condition improves. Reducing and Redistributing Pressure  Do not lie or sit in one position for a long time. Move or  change position every two hours or as told by your health care provider.  Use pillows or cushions to reduce pressure. Ask your health care provider to recommend cushions or pads for you.  Use medical devices that do not rub your skin. Tell your health care provider if one of your medical devices is causing a pressure injury to develop. General Instructions  Eat a healthy diet that includes lots of protein. Ask your health care provider for diet advice.  Drink enough fluid to keep your urine clear or pale yellow.  Be as active as you can every day. Ask your health care provider to suggest safe exercises or activities.  Do not abuse drugs or alcohol.  Keep all follow-up visits as told by your health care provider. This is important.  Do not smoke. SEEK MEDICAL CARE IF:  You have chills or fever.  Your pain medicine is not helping.  You have any changes in skin color.  You have new blisters or sores.  You develop warmth, redness, or swelling near a pressure injury.  You have a bad odor or pus coming from your pressure injury.  You  lose control of your bowels or bladder.  You develop new symptoms.  Your wound does not improve after 1-2 weeks of treatment.  You develop a new medical condition, such as diabetes, peripheral vascular disease, or conditions that affect your defense (immune) system.   This information is not intended to replace advice given to you by your health care provider. Make sure you discuss any questions you have with your health care provider.   Document Released: 10/28/2005 Document Revised: 07/19/2015 Document Reviewed: 03/08/2015 Elsevier Interactive Patient Education 2016 [employer]  Skin Ulcer A skin ulcer is an open sore that can be shallow or deep. Skin ulcers sometimes become infected and are difficult to treat. It may be 1 month or longer before real healing progress is made. CAUSES   Injury.  Problems with the veins or arteries.  Diabetes.  Insect bites.  Bedsores.  Inflammatory conditions. SYMPTOMS   Pain, redness, swelling, and tenderness around the ulcer.  Fever.  Bleeding from the ulcer.  Yellow or clear fluid coming from the ulcer. DIAGNOSIS  There are many types of skin ulcers. Any open sores will be examined. Certain tests will be done to determine the kind of ulcer you have. The right treatment depends on the type of ulcer you have. TREATMENT  Treatment is a long-term challenge. It may include:  Wearing an elastic wrap, compression stockings, or gel cast over the ulcer area.  Taking antibiotic medicines or putting antibiotic creams on the affected area if there is an infection. HOME CARE INSTRUCTIONS  Put on your bandages (dressings), wraps, or casts over the ulcer as directed by your caregiver.  Change all dressings as directed by your caregiver.  Take all medicines as directed by your caregiver.  Keep the affected area clean and dry.  Avoid injuries to the affected area.  Eat a well-balanced, healthy diet that includes plenty of fruit and  vegetables.  If you smoke, consider quitting or decreasing the amount of cigarettes you smoke.  Once the ulcer heals, get regular exercise as directed by your caregiver.  Work with your caregiver to make sure your blood pressure, cholesterol, and diabetes are well-controlled.  Keep your skin moisturized. Dry skin can crack and lead to skin ulcers. SEEK IMMEDIATE MEDICAL CARE IF:   Your pain gets worse.  You have swelling, redness, or fluids  around the ulcer.  You have chills.  You have a fever. MAKE SURE YOU:   Understand these instructions.  Will watch your condition.  Will get help right away if you are not doing well or get worse.   This information is not intended to replace advice given to you by your health care provider. Make sure you discuss any questions you have with your health care provider.   Document Released: 12/05/2004 Document Revised: 01/20/2012 Document Reviewed: 06/14/2011 Elsevier Interactive Patient Education [employer].

## 2016-05-08 NOTE — Progress Notes (Signed)
Assessment and Plan:  1. Type 2 diabetes mellitus with diabetic neuropathy, without long-term current use of insulin ([place]) Discussed general issues about diabetes pathophysiology and management., Educational material distributed., Suggested low cholesterol diet., Encouraged aerobic exercise., Discussed foot care., Reminded to get yearly retinal exam. - Hemoglobin A1c  2. T2_NIDDM w/Stage 3 CKD (GFR 40 ml/min) Discussed general issues about diabetes pathophysiology and management., Educational material distributed., Suggested low cholesterol diet., Encouraged aerobic exercise., Discussed foot care., Reminded to get yearly retinal exam.  3. Hyperlipidemia -continue medications, check lipids, decrease fatty foods, increase activity.  - TSH - Lipid panel  4. PAF (paroxysmal atrial fibrillation) (HCC) Continue cardio follow up, very high fall risk, dicusussed risk/benefits at this time, will continue until they follow up with cardio.   5. Sick sinus syndrome (HCC) Continue cardio follow up  6. Medication management - CBC with Differential/Platelet - BASIC METABOLIC PANEL WITH GFR - Hepatic function panel  7. Pressure ulcer of left buttock, stage 2 Patient lives alone, unable to tend to her wound, and very difficult for her to leave her house due to imbalance, would benefit from wound care nurse for stage 2 pressure ulcer left buttocks. No infection at this time, keep clean, try to lie down or sit in different positions.  - Ambulatory referral to Home Health  8. Bilateral low back pain without sciatica Would benefit from home health - Ambulatory referral to [facility]  9. Imbalance Would benefit from PT/home health - TSH - Ambulatory referral to [facility]  10. Stable angina (HCC) Likely angina from story, NTG PRN, declines imdur at this time, declines work up due to age, will treat medically. Continue coumadin and follow up with cardio.    Continue diet and meds as discussed.  Further disposition pending results of labs. Discussed med's effects and SE's.   Over 30 minutes of exam, counseling, chart review, and critical decision making was performed No future appointments.   HPI 82 y.o. female  presents for 3 month follow up on hypertension, cholesterol, diabetes and vitamin D deficiency and acute visit.   She is here with her daughter. She complains of lower back pain and pressure ulcers. She lives in an apartment by herself, has not left the apartment since before easter. She states she had an episode of chest discomfort leaving her apartment. Barnabas Lister and susan bring her food, and neighbors help, and has a lady that does cleaning.   She hurt her back the sat before easter, states it is still hurting, has gotten gradually worse, walks with a walker. She has seen Dr. Mayer Camel an [facility], had xrays and given medication but she was unable to tolerate due to dizziness. She continue to have lower back pain, no radicular symptoms.  Patient denies fever, hematuria, incontinence, numbness, tingling, weakness and saddle anesthesia. Has some left hand pain. Follows up with Mayer Camel next week. Due to her back pain, she has started to sit differently, she will sleep/sit in a chair, noticed sores x 2 weeks. Has been putting vaseline on them. Denies fever/chills. Very difficult for her to leave the home due to imbalance, visoin.   Her blood pressure has been controlled at home, today their BP is BP: 140/80 mmHg  She does not workout. She denies chest pain, shortness of breath, dizziness. She has history of Afib, follows with coumadin clinic.   She is not on cholesterol medication and denies myalgias. Her cholesterol is at goal. The cholesterol last visit was:   Lab  Results  Component Value Date   CHOL 184 12/28/2015   HDL 63 12/28/2015   LDLCALC 86 12/28/2015   TRIG 176* 12/28/2015   CHOLHDL 2.9 12/28/2015   She has been working on diet and exercise for Diabetes with diabetic  chronic kidney disease, she is not on bASA, she is on ACE/ARB, and denies polydipsia and polyuria. Last A1C was:  Lab Results  Component Value Date   HGBA1C 6.5* 12/28/2015   Patient is on Vitamin D supplement.   Lab Results  Component Value Date   VD25OH 43 09/26/2015       Current Medications:  Current Outpatient Prescriptions on File Prior to Visit  Medication Sig Dispense Refill  . amLODipine (NORVASC) 10 MG tablet TAKE ONE TABLET BY MOUTH EVERY DAY 90 tablet 2  . b complex vitamins tablet Take 1 tablet by mouth daily.    . Coenzyme Q10 (CO Q 10) 100 MG CAPS Take 1 capsule by mouth daily.    Marland Kitchen glimepiride (AMARYL) 1 MG tablet TAKE 1 TABLET BY MOUTH EVERY DAY 90 tablet 0  . losartan (COZAAR) 100 MG tablet Take 1 tablet (100 mg total) by mouth daily. 30 tablet 10  . Magnesium 250 MG TABS Take 1 tablet by mouth daily.     . metFORMIN (GLUCOPHAGE) 500 MG tablet TAKE 2 TABLETS BY MOUTH TWICE DAILY 120 tablet 0  . metoprolol (TOPROL-XL) 200 MG 24 hr tablet TAKE 1 TABLET BY MOUTH DAILY 90 tablet 0  . ONE TOUCH ULTRA TEST test strip USE ONE STRIP TO TEST BLOOD GLUCOSE TWICE DAILY 100 each 6  . warfarin (COUMADIN) 5 MG tablet TAKE 1/2 TO 1 TABLET BY MOUTH ONCE DAILY AS DIRECTED 30 tablet 3   No current facility-administered medications on file prior to visit.   Medical History:  Past Medical History  Diagnosis Date  . Dizziness   . Cataract   . Paroxysmal atrial fibrillation (HCC)   . Sick sinus syndrome ([place])   . Type II or unspecified type diabetes mellitus without mention of complication, not stated as uncontrolled   . Hyperlipidemia   . Hypertensive cardiovascular disease   . Retinopathy   . CKD (chronic kidney disease) stage 3, GFR 30-59 ml/min     due to DM   Allergies: No Known Allergies   Review of Systems:  Review of Systems  Constitutional: Negative for fever, chills and malaise/fatigue.  HENT: Negative for congestion, ear pain, nosebleeds and sore throat.   Eyes:  Positive for blurred vision.  Respiratory: Negative for cough, shortness of breath and wheezing.   Cardiovascular: Positive for chest pain and leg swelling. Negative for palpitations, orthopnea, claudication and PND.  Gastrointestinal: Negative for heartburn, diarrhea, constipation, blood in stool and melena.  Genitourinary: Negative.   Musculoskeletal: Positive for myalgias and back pain. Negative for falls.  Skin: Positive for rash.  Neurological: Negative for dizziness, sensory change, loss of consciousness, weakness and headaches.  Psychiatric/Behavioral: Negative for depression and memory loss. The patient is not nervous/anxious and does not have insomnia.     Family history- Review and unchanged Social history- Review and unchanged Physical Exam: BP 140/80 mmHg  Pulse 88  Temp(Src) 97.7 F (36.5 C) (Temporal)  Resp 14  Ht 5\' 3"  (1.6 m)  Wt 145 lb 3.2 oz (65.862 kg)  BMI 25.73 kg/m2  SpO2 97% Wt Readings from Last 3 Encounters:  05/08/16 145 lb 3.2 oz (65.862 kg)  12/28/15 147 lb (66.679 kg)  10/11/15 145 lb 3.2  oz (65.862 kg)   HEENT: normocephalic, sclerae anicteric, TMs pearly, nares patent, no discharge or erythema, pharynx normal, left eye enucleated.  Oral cavity: MMM, no lesions Neck: supple, no lymphadenopathy, no thyromegaly, no masses Heart: RRR, normal S1, S2, RSB systolic click/murmur, 2+ edema Lungs: CTA bilaterally, no wheezes, rhonchi, or rales Abdomen: +bs, soft, non tender, non distended, no masses, no hepatomegaly, no splenomegaly Musculoskeletal: nontender, no swelling, no obvious deformity Extremities: no edema, no cyanosis, no clubbing Pulses: 2+ symmetric, upper and lower extremities, normal cap refill Neurological: alert, oriented x 3, CN2-12 intact, decreased strenght upper extremities and lower extremities, sensation normal throughout, DTRs 2+ throughout, no cerebellar signs, gait antalgic with walker Psychiatric: normal affect, behavior normal,  pleasant  Skin: Left inner gluteal fold with 5x73mm stage 2 pressure ulcer with right inner gluteal fold with stage 1 pressure ulcer.   Vicie Mutters, PA-C 2:46 PM [facility]

## 2016-05-09 DIAGNOSIS — E113599 Type 2 diabetes mellitus with proliferative diabetic retinopathy without macular edema, unspecified eye: Secondary | ICD-10-CM | POA: Diagnosis not present

## 2016-05-09 DIAGNOSIS — H353210 Exudative age-related macular degeneration, right eye, stage unspecified: Secondary | ICD-10-CM | POA: Diagnosis not present

## 2016-05-09 DIAGNOSIS — Z9001 Acquired absence of eye: Secondary | ICD-10-CM | POA: Diagnosis not present

## 2016-05-09 DIAGNOSIS — E119 Type 2 diabetes mellitus without complications: Secondary | ICD-10-CM | POA: Diagnosis not present

## 2016-05-09 LAB — TSH: TSH: 1.22 m[IU]/L

## 2016-05-09 LAB — HEPATIC FUNCTION PANEL
ALBUMIN: 4 g/dL (ref 3.6–5.1)
ALT: 13 U/L (ref 6–29)
AST: 14 U/L (ref 10–35)
Alkaline Phosphatase: 93 U/L (ref 33–130)
BILIRUBIN INDIRECT: 0.2 mg/dL (ref 0.2–1.2)
Bilirubin, Direct: 0 mg/dL (ref <=0.2)
TOTAL PROTEIN: 7 g/dL (ref 6.1–8.1)
Total Bilirubin: 0.2 mg/dL (ref 0.2–1.2)

## 2016-05-09 LAB — HEMOGLOBIN A1C
HEMOGLOBIN A1C: 6.4 % — AB (ref <5.7)
MEAN PLASMA GLUCOSE: 137 mg/dL

## 2016-05-09 LAB — LIPID PANEL
CHOLESTEROL: 173 mg/dL (ref 125–200)
HDL: 75 mg/dL (ref >=46)
LDL Cholesterol: 70 mg/dL (ref <130)
TRIGLYCERIDES: 140 mg/dL (ref <150)
Total CHOL/HDL Ratio: 2.3 Ratio (ref <=5.0)
VLDL: 28 mg/dL (ref <30)

## 2016-05-09 LAB — BASIC METABOLIC PANEL WITH GFR
BUN: 33 mg/dL — AB (ref 7–25)
CALCIUM: 9.5 mg/dL (ref 8.6–10.4)
CO2: 23 mmol/L (ref 20–31)
CREATININE: 1.2 mg/dL — AB (ref 0.60–0.88)
Chloride: 105 mmol/L (ref 98–110)
GFR, EST AFRICAN AMERICAN: 47 mL/min — AB (ref >=60)
GFR, EST NON AFRICAN AMERICAN: 40 mL/min — AB (ref >=60)
GLUCOSE: 121 mg/dL — AB (ref 65–99)
Potassium: 5.3 mmol/L (ref 3.5–5.3)
Sodium: 137 mmol/L (ref 135–146)

## 2016-05-16 DIAGNOSIS — M545 Low back pain: Secondary | ICD-10-CM | POA: Diagnosis not present

## 2016-05-17 DIAGNOSIS — E1122 Type 2 diabetes mellitus with diabetic chronic kidney disease: Secondary | ICD-10-CM | POA: Diagnosis not present

## 2016-05-17 DIAGNOSIS — H3530 Unspecified macular degeneration: Secondary | ICD-10-CM | POA: Diagnosis not present

## 2016-05-17 DIAGNOSIS — L89322 Pressure ulcer of left buttock, stage 2: Secondary | ICD-10-CM | POA: Diagnosis not present

## 2016-05-17 DIAGNOSIS — N183 Chronic kidney disease, stage 3 (moderate): Secondary | ICD-10-CM | POA: Diagnosis not present

## 2016-05-17 DIAGNOSIS — I208 Other forms of angina pectoris: Secondary | ICD-10-CM | POA: Diagnosis not present

## 2016-05-17 DIAGNOSIS — M545 Low back pain: Secondary | ICD-10-CM | POA: Diagnosis not present

## 2016-05-17 DIAGNOSIS — E11319 Type 2 diabetes mellitus with unspecified diabetic retinopathy without macular edema: Secondary | ICD-10-CM | POA: Diagnosis not present

## 2016-05-17 DIAGNOSIS — E785 Hyperlipidemia, unspecified: Secondary | ICD-10-CM | POA: Diagnosis not present

## 2016-05-17 DIAGNOSIS — Z9181 History of falling: Secondary | ICD-10-CM | POA: Diagnosis not present

## 2016-05-17 DIAGNOSIS — L89312 Pressure ulcer of right buttock, stage 2: Secondary | ICD-10-CM | POA: Diagnosis not present

## 2016-05-17 DIAGNOSIS — Z85828 Personal history of other malignant neoplasm of skin: Secondary | ICD-10-CM | POA: Diagnosis not present

## 2016-05-17 DIAGNOSIS — Z7901 Long term (current) use of anticoagulants: Secondary | ICD-10-CM | POA: Diagnosis not present

## 2016-05-17 DIAGNOSIS — Z7984 Long term (current) use of oral hypoglycemic drugs: Secondary | ICD-10-CM | POA: Diagnosis not present

## 2016-05-17 DIAGNOSIS — I480 Paroxysmal atrial fibrillation: Secondary | ICD-10-CM | POA: Diagnosis not present

## 2016-05-17 DIAGNOSIS — E1140 Type 2 diabetes mellitus with diabetic neuropathy, unspecified: Secondary | ICD-10-CM | POA: Diagnosis not present

## 2016-05-17 DIAGNOSIS — E1121 Type 2 diabetes mellitus with diabetic nephropathy: Secondary | ICD-10-CM | POA: Diagnosis not present

## 2016-05-19 ENCOUNTER — Encounter: Payer: Self-pay | Admitting: *Deleted

## 2016-05-20 ENCOUNTER — Other Ambulatory Visit (HOSPITAL_COMMUNITY): Payer: Self-pay | Admitting: Orthopedic Surgery

## 2016-05-20 DIAGNOSIS — M5440 Lumbago with sciatica, unspecified side: Secondary | ICD-10-CM

## 2016-05-21 DIAGNOSIS — N183 Chronic kidney disease, stage 3 (moderate): Secondary | ICD-10-CM | POA: Diagnosis not present

## 2016-05-21 DIAGNOSIS — E1121 Type 2 diabetes mellitus with diabetic nephropathy: Secondary | ICD-10-CM | POA: Diagnosis not present

## 2016-05-21 DIAGNOSIS — L89322 Pressure ulcer of left buttock, stage 2: Secondary | ICD-10-CM | POA: Diagnosis not present

## 2016-05-21 DIAGNOSIS — E1140 Type 2 diabetes mellitus with diabetic neuropathy, unspecified: Secondary | ICD-10-CM | POA: Diagnosis not present

## 2016-05-21 DIAGNOSIS — E1122 Type 2 diabetes mellitus with diabetic chronic kidney disease: Secondary | ICD-10-CM | POA: Diagnosis not present

## 2016-05-21 DIAGNOSIS — L89312 Pressure ulcer of right buttock, stage 2: Secondary | ICD-10-CM | POA: Diagnosis not present

## 2016-05-22 ENCOUNTER — Encounter (HOSPITAL_COMMUNITY): Payer: Medicare Other

## 2016-05-22 DIAGNOSIS — L89312 Pressure ulcer of right buttock, stage 2: Secondary | ICD-10-CM | POA: Diagnosis not present

## 2016-05-22 DIAGNOSIS — E1140 Type 2 diabetes mellitus with diabetic neuropathy, unspecified: Secondary | ICD-10-CM | POA: Diagnosis not present

## 2016-05-22 DIAGNOSIS — E1121 Type 2 diabetes mellitus with diabetic nephropathy: Secondary | ICD-10-CM | POA: Diagnosis not present

## 2016-05-22 DIAGNOSIS — N183 Chronic kidney disease, stage 3 (moderate): Secondary | ICD-10-CM | POA: Diagnosis not present

## 2016-05-22 DIAGNOSIS — E1122 Type 2 diabetes mellitus with diabetic chronic kidney disease: Secondary | ICD-10-CM | POA: Diagnosis not present

## 2016-05-22 DIAGNOSIS — L89322 Pressure ulcer of left buttock, stage 2: Secondary | ICD-10-CM | POA: Diagnosis not present

## 2016-05-23 ENCOUNTER — Telehealth: Payer: Self-pay

## 2016-05-23 NOTE — Telephone Encounter (Signed)
Pt had questions about how much the zostavax would cost & i informed pt that her out of pocket cost would be $119.77. Pt states she will think about this &  @ her next will let us know if this what she wants.

## 2016-05-24 DIAGNOSIS — L89312 Pressure ulcer of right buttock, stage 2: Secondary | ICD-10-CM | POA: Diagnosis not present

## 2016-05-24 DIAGNOSIS — L89322 Pressure ulcer of left buttock, stage 2: Secondary | ICD-10-CM | POA: Diagnosis not present

## 2016-05-24 DIAGNOSIS — E1122 Type 2 diabetes mellitus with diabetic chronic kidney disease: Secondary | ICD-10-CM | POA: Diagnosis not present

## 2016-05-24 DIAGNOSIS — E1121 Type 2 diabetes mellitus with diabetic nephropathy: Secondary | ICD-10-CM | POA: Diagnosis not present

## 2016-05-24 DIAGNOSIS — E1140 Type 2 diabetes mellitus with diabetic neuropathy, unspecified: Secondary | ICD-10-CM | POA: Diagnosis not present

## 2016-05-24 DIAGNOSIS — N183 Chronic kidney disease, stage 3 (moderate): Secondary | ICD-10-CM | POA: Diagnosis not present

## 2016-05-28 DIAGNOSIS — N183 Chronic kidney disease, stage 3 (moderate): Secondary | ICD-10-CM | POA: Diagnosis not present

## 2016-05-28 DIAGNOSIS — E1121 Type 2 diabetes mellitus with diabetic nephropathy: Secondary | ICD-10-CM | POA: Diagnosis not present

## 2016-05-28 DIAGNOSIS — E1122 Type 2 diabetes mellitus with diabetic chronic kidney disease: Secondary | ICD-10-CM | POA: Diagnosis not present

## 2016-05-28 DIAGNOSIS — E1140 Type 2 diabetes mellitus with diabetic neuropathy, unspecified: Secondary | ICD-10-CM | POA: Diagnosis not present

## 2016-05-28 DIAGNOSIS — L89312 Pressure ulcer of right buttock, stage 2: Secondary | ICD-10-CM | POA: Diagnosis not present

## 2016-05-28 DIAGNOSIS — L89322 Pressure ulcer of left buttock, stage 2: Secondary | ICD-10-CM | POA: Diagnosis not present

## 2016-05-30 DIAGNOSIS — L89312 Pressure ulcer of right buttock, stage 2: Secondary | ICD-10-CM | POA: Diagnosis not present

## 2016-05-30 DIAGNOSIS — N183 Chronic kidney disease, stage 3 (moderate): Secondary | ICD-10-CM | POA: Diagnosis not present

## 2016-05-30 DIAGNOSIS — E1140 Type 2 diabetes mellitus with diabetic neuropathy, unspecified: Secondary | ICD-10-CM | POA: Diagnosis not present

## 2016-05-30 DIAGNOSIS — E1122 Type 2 diabetes mellitus with diabetic chronic kidney disease: Secondary | ICD-10-CM | POA: Diagnosis not present

## 2016-05-30 DIAGNOSIS — E1121 Type 2 diabetes mellitus with diabetic nephropathy: Secondary | ICD-10-CM | POA: Diagnosis not present

## 2016-05-30 DIAGNOSIS — L89322 Pressure ulcer of left buttock, stage 2: Secondary | ICD-10-CM | POA: Diagnosis not present

## 2016-05-31 ENCOUNTER — Encounter (HOSPITAL_COMMUNITY): Payer: Medicare Other

## 2016-06-03 DIAGNOSIS — L89322 Pressure ulcer of left buttock, stage 2: Secondary | ICD-10-CM | POA: Diagnosis not present

## 2016-06-03 DIAGNOSIS — L89312 Pressure ulcer of right buttock, stage 2: Secondary | ICD-10-CM | POA: Diagnosis not present

## 2016-06-03 DIAGNOSIS — E1121 Type 2 diabetes mellitus with diabetic nephropathy: Secondary | ICD-10-CM | POA: Diagnosis not present

## 2016-06-03 DIAGNOSIS — N183 Chronic kidney disease, stage 3 (moderate): Secondary | ICD-10-CM | POA: Diagnosis not present

## 2016-06-03 DIAGNOSIS — E1140 Type 2 diabetes mellitus with diabetic neuropathy, unspecified: Secondary | ICD-10-CM | POA: Diagnosis not present

## 2016-06-03 DIAGNOSIS — E1122 Type 2 diabetes mellitus with diabetic chronic kidney disease: Secondary | ICD-10-CM | POA: Diagnosis not present

## 2016-06-04 DIAGNOSIS — E1140 Type 2 diabetes mellitus with diabetic neuropathy, unspecified: Secondary | ICD-10-CM | POA: Diagnosis not present

## 2016-06-04 DIAGNOSIS — E1122 Type 2 diabetes mellitus with diabetic chronic kidney disease: Secondary | ICD-10-CM | POA: Diagnosis not present

## 2016-06-04 DIAGNOSIS — N183 Chronic kidney disease, stage 3 (moderate): Secondary | ICD-10-CM | POA: Diagnosis not present

## 2016-06-04 DIAGNOSIS — E1121 Type 2 diabetes mellitus with diabetic nephropathy: Secondary | ICD-10-CM | POA: Diagnosis not present

## 2016-06-04 DIAGNOSIS — L89322 Pressure ulcer of left buttock, stage 2: Secondary | ICD-10-CM | POA: Diagnosis not present

## 2016-06-04 DIAGNOSIS — L89312 Pressure ulcer of right buttock, stage 2: Secondary | ICD-10-CM | POA: Diagnosis not present

## 2016-06-05 DIAGNOSIS — E1140 Type 2 diabetes mellitus with diabetic neuropathy, unspecified: Secondary | ICD-10-CM | POA: Diagnosis not present

## 2016-06-05 DIAGNOSIS — L89312 Pressure ulcer of right buttock, stage 2: Secondary | ICD-10-CM | POA: Diagnosis not present

## 2016-06-05 DIAGNOSIS — L89322 Pressure ulcer of left buttock, stage 2: Secondary | ICD-10-CM | POA: Diagnosis not present

## 2016-06-05 DIAGNOSIS — N183 Chronic kidney disease, stage 3 (moderate): Secondary | ICD-10-CM | POA: Diagnosis not present

## 2016-06-05 DIAGNOSIS — E1121 Type 2 diabetes mellitus with diabetic nephropathy: Secondary | ICD-10-CM | POA: Diagnosis not present

## 2016-06-05 DIAGNOSIS — E1122 Type 2 diabetes mellitus with diabetic chronic kidney disease: Secondary | ICD-10-CM | POA: Diagnosis not present

## 2016-06-09 ENCOUNTER — Other Ambulatory Visit: Payer: Self-pay | Admitting: Internal Medicine

## 2016-06-11 DIAGNOSIS — E1140 Type 2 diabetes mellitus with diabetic neuropathy, unspecified: Secondary | ICD-10-CM | POA: Diagnosis not present

## 2016-06-11 DIAGNOSIS — L89312 Pressure ulcer of right buttock, stage 2: Secondary | ICD-10-CM | POA: Diagnosis not present

## 2016-06-11 DIAGNOSIS — E1122 Type 2 diabetes mellitus with diabetic chronic kidney disease: Secondary | ICD-10-CM | POA: Diagnosis not present

## 2016-06-11 DIAGNOSIS — E1121 Type 2 diabetes mellitus with diabetic nephropathy: Secondary | ICD-10-CM | POA: Diagnosis not present

## 2016-06-11 DIAGNOSIS — N183 Chronic kidney disease, stage 3 (moderate): Secondary | ICD-10-CM | POA: Diagnosis not present

## 2016-06-11 DIAGNOSIS — L89322 Pressure ulcer of left buttock, stage 2: Secondary | ICD-10-CM | POA: Diagnosis not present

## 2016-06-12 ENCOUNTER — Encounter (HOSPITAL_COMMUNITY)
Admission: RE | Admit: 2016-06-12 | Discharge: 2016-06-12 | Disposition: A | Discharge status: Home / Self Care | Payer: Medicare Other | Source: Ambulatory Visit | Attending: Orthopedic Surgery | Admitting: Orthopedic Surgery

## 2016-06-12 DIAGNOSIS — M5440 Lumbago with sciatica, unspecified side: Secondary | ICD-10-CM

## 2016-06-12 DIAGNOSIS — R937 Abnormal findings on diagnostic imaging of other parts of musculoskeletal system: Secondary | ICD-10-CM | POA: Diagnosis not present

## 2016-06-12 DIAGNOSIS — M545 Low back pain: Secondary | ICD-10-CM | POA: Insufficient documentation

## 2016-06-12 MED ORDER — TECHNETIUM TC 99M MEDRONATE IV KIT
25.0000 | PACK | Freq: Once | INTRAVENOUS | Status: AC | PRN
  Administered 2016-06-12: 25 via INTRAVENOUS

## 2016-06-13 ENCOUNTER — Telehealth: Payer: Self-pay | Admitting: *Deleted

## 2016-06-13 DIAGNOSIS — L89312 Pressure ulcer of right buttock, stage 2: Secondary | ICD-10-CM | POA: Diagnosis not present

## 2016-06-13 DIAGNOSIS — E1140 Type 2 diabetes mellitus with diabetic neuropathy, unspecified: Secondary | ICD-10-CM | POA: Diagnosis not present

## 2016-06-13 DIAGNOSIS — E1122 Type 2 diabetes mellitus with diabetic chronic kidney disease: Secondary | ICD-10-CM | POA: Diagnosis not present

## 2016-06-13 DIAGNOSIS — E1121 Type 2 diabetes mellitus with diabetic nephropathy: Secondary | ICD-10-CM | POA: Diagnosis not present

## 2016-06-13 DIAGNOSIS — L89322 Pressure ulcer of left buttock, stage 2: Secondary | ICD-10-CM | POA: Diagnosis not present

## 2016-06-13 DIAGNOSIS — N183 Chronic kidney disease, stage 3 (moderate): Secondary | ICD-10-CM | POA: Diagnosis not present

## 2016-06-13 NOTE — Telephone Encounter (Signed)
PT called and asked if patient can take Arthritis Strenght Tylenol 650 mg BID for back pain.  Per Dr Melford Aase, it will be OK.

## 2016-06-19 ENCOUNTER — Other Ambulatory Visit: Payer: Self-pay

## 2016-06-19 DIAGNOSIS — M545 Low back pain: Secondary | ICD-10-CM | POA: Diagnosis not present

## 2016-06-21 ENCOUNTER — Other Ambulatory Visit: Payer: Self-pay

## 2016-06-21 ENCOUNTER — Ambulatory Visit (INDEPENDENT_AMBULATORY_CARE_PROVIDER_SITE_OTHER): Payer: Medicare Other | Admitting: Pharmacist

## 2016-06-21 ENCOUNTER — Other Ambulatory Visit: Payer: Self-pay | Admitting: Internal Medicine

## 2016-06-21 DIAGNOSIS — E1122 Type 2 diabetes mellitus with diabetic chronic kidney disease: Secondary | ICD-10-CM | POA: Diagnosis not present

## 2016-06-21 DIAGNOSIS — L89312 Pressure ulcer of right buttock, stage 2: Secondary | ICD-10-CM | POA: Diagnosis not present

## 2016-06-21 DIAGNOSIS — Z5181 Encounter for therapeutic drug level monitoring: Secondary | ICD-10-CM

## 2016-06-21 DIAGNOSIS — E1121 Type 2 diabetes mellitus with diabetic nephropathy: Secondary | ICD-10-CM | POA: Diagnosis not present

## 2016-06-21 DIAGNOSIS — E1140 Type 2 diabetes mellitus with diabetic neuropathy, unspecified: Secondary | ICD-10-CM | POA: Diagnosis not present

## 2016-06-21 DIAGNOSIS — L89322 Pressure ulcer of left buttock, stage 2: Secondary | ICD-10-CM | POA: Diagnosis not present

## 2016-06-21 DIAGNOSIS — N183 Chronic kidney disease, stage 3 (moderate): Secondary | ICD-10-CM | POA: Diagnosis not present

## 2016-06-21 LAB — POCT INR: INR: 1.3

## 2016-06-21 MED ORDER — WARFARIN SODIUM 5 MG PO TABS: ORAL_TABLET | ORAL | 0 refills | Status: DC

## 2016-06-27 DIAGNOSIS — L89322 Pressure ulcer of left buttock, stage 2: Secondary | ICD-10-CM | POA: Diagnosis not present

## 2016-06-27 DIAGNOSIS — N183 Chronic kidney disease, stage 3 (moderate): Secondary | ICD-10-CM | POA: Diagnosis not present

## 2016-06-27 DIAGNOSIS — E1140 Type 2 diabetes mellitus with diabetic neuropathy, unspecified: Secondary | ICD-10-CM | POA: Diagnosis not present

## 2016-06-27 DIAGNOSIS — E1122 Type 2 diabetes mellitus with diabetic chronic kidney disease: Secondary | ICD-10-CM | POA: Diagnosis not present

## 2016-06-27 DIAGNOSIS — E1121 Type 2 diabetes mellitus with diabetic nephropathy: Secondary | ICD-10-CM | POA: Diagnosis not present

## 2016-06-27 DIAGNOSIS — L89312 Pressure ulcer of right buttock, stage 2: Secondary | ICD-10-CM | POA: Diagnosis not present

## 2016-07-03 ENCOUNTER — Ambulatory Visit (INDEPENDENT_AMBULATORY_CARE_PROVIDER_SITE_OTHER): Payer: Medicare Other | Admitting: Pharmacist

## 2016-07-03 DIAGNOSIS — Z5181 Encounter for therapeutic drug level monitoring: Secondary | ICD-10-CM | POA: Diagnosis not present

## 2016-07-03 LAB — POCT INR: INR: 2.5

## 2016-07-17 ENCOUNTER — Ambulatory Visit (INDEPENDENT_AMBULATORY_CARE_PROVIDER_SITE_OTHER): Payer: Medicare Other | Admitting: *Deleted

## 2016-07-17 DIAGNOSIS — Z5181 Encounter for therapeutic drug level monitoring: Secondary | ICD-10-CM | POA: Diagnosis not present

## 2016-07-17 LAB — POCT INR: INR: 1.4

## 2016-07-23 ENCOUNTER — Ambulatory Visit (INDEPENDENT_AMBULATORY_CARE_PROVIDER_SITE_OTHER): Payer: Medicare Other | Admitting: *Deleted

## 2016-07-23 DIAGNOSIS — Z5181 Encounter for therapeutic drug level monitoring: Secondary | ICD-10-CM | POA: Diagnosis not present

## 2016-07-23 LAB — POCT INR: INR: 2.5

## 2016-07-26 ENCOUNTER — Other Ambulatory Visit: Payer: Self-pay | Admitting: Internal Medicine

## 2016-08-01 ENCOUNTER — Other Ambulatory Visit: Payer: Self-pay | Admitting: Internal Medicine

## 2016-08-06 DIAGNOSIS — H353111 Nonexudative age-related macular degeneration, right eye, early dry stage: Secondary | ICD-10-CM | POA: Diagnosis not present

## 2016-08-06 DIAGNOSIS — E103291 Type 1 diabetes mellitus with mild nonproliferative diabetic retinopathy without macular edema, right eye: Secondary | ICD-10-CM | POA: Diagnosis not present

## 2016-08-21 DIAGNOSIS — C6990 Malignant neoplasm of unspecified site of unspecified eye: Secondary | ICD-10-CM | POA: Diagnosis not present

## 2016-08-21 DIAGNOSIS — J310 Chronic rhinitis: Secondary | ICD-10-CM | POA: Diagnosis not present

## 2016-08-27 ENCOUNTER — Other Ambulatory Visit: Payer: Self-pay | Admitting: Internal Medicine

## 2016-08-28 ENCOUNTER — Ambulatory Visit: Payer: Self-pay | Admitting: Internal Medicine

## 2016-09-05 ENCOUNTER — Ambulatory Visit (INDEPENDENT_AMBULATORY_CARE_PROVIDER_SITE_OTHER): Payer: Medicare Other | Admitting: Internal Medicine

## 2016-09-05 ENCOUNTER — Encounter: Payer: Self-pay | Admitting: Internal Medicine

## 2016-09-05 ENCOUNTER — Ambulatory Visit: Payer: Self-pay | Admitting: Internal Medicine

## 2016-09-05 VITALS — BP 124/60 | HR 72 | Temp 98.0°F | Resp 14 | Ht 63.0 in | Wt 144.0 lb

## 2016-09-05 DIAGNOSIS — E559 Vitamin D deficiency, unspecified: Secondary | ICD-10-CM

## 2016-09-05 DIAGNOSIS — J300 Vasomotor rhinitis: Secondary | ICD-10-CM

## 2016-09-05 DIAGNOSIS — I480 Paroxysmal atrial fibrillation: Secondary | ICD-10-CM

## 2016-09-05 DIAGNOSIS — R946 Abnormal results of thyroid function studies: Secondary | ICD-10-CM | POA: Diagnosis not present

## 2016-09-05 DIAGNOSIS — R6889 Other general symptoms and signs: Secondary | ICD-10-CM | POA: Diagnosis not present

## 2016-09-05 DIAGNOSIS — E1140 Type 2 diabetes mellitus with diabetic neuropathy, unspecified: Secondary | ICD-10-CM

## 2016-09-05 DIAGNOSIS — I119 Hypertensive heart disease without heart failure: Secondary | ICD-10-CM

## 2016-09-05 DIAGNOSIS — Z79899 Other long term (current) drug therapy: Secondary | ICD-10-CM

## 2016-09-05 DIAGNOSIS — C44310 Basal cell carcinoma of skin of unspecified parts of face: Secondary | ICD-10-CM | POA: Diagnosis not present

## 2016-09-05 DIAGNOSIS — Z9889 Other specified postprocedural states: Secondary | ICD-10-CM

## 2016-09-05 DIAGNOSIS — I10 Essential (primary) hypertension: Secondary | ICD-10-CM | POA: Diagnosis not present

## 2016-09-05 DIAGNOSIS — Z0000 Encounter for general adult medical examination without abnormal findings: Secondary | ICD-10-CM

## 2016-09-05 DIAGNOSIS — I482 Chronic atrial fibrillation, unspecified: Secondary | ICD-10-CM

## 2016-09-05 DIAGNOSIS — H3530 Unspecified macular degeneration: Secondary | ICD-10-CM

## 2016-09-05 DIAGNOSIS — Z6825 Body mass index (BMI) 25.0-25.9, adult: Secondary | ICD-10-CM

## 2016-09-05 DIAGNOSIS — Z9229 Personal history of other drug therapy: Secondary | ICD-10-CM

## 2016-09-05 DIAGNOSIS — H3500 Unspecified background retinopathy: Secondary | ICD-10-CM

## 2016-09-05 DIAGNOSIS — Z0001 Encounter for general adult medical examination with abnormal findings: Secondary | ICD-10-CM

## 2016-09-05 DIAGNOSIS — E1121 Type 2 diabetes mellitus with diabetic nephropathy: Secondary | ICD-10-CM

## 2016-09-05 DIAGNOSIS — Z961 Presence of intraocular lens: Secondary | ICD-10-CM

## 2016-09-05 DIAGNOSIS — E782 Mixed hyperlipidemia: Secondary | ICD-10-CM | POA: Diagnosis not present

## 2016-09-05 DIAGNOSIS — Z7901 Long term (current) use of anticoagulants: Secondary | ICD-10-CM

## 2016-09-05 DIAGNOSIS — I495 Sick sinus syndrome: Secondary | ICD-10-CM

## 2016-09-05 DIAGNOSIS — Z5181 Encounter for therapeutic drug level monitoring: Secondary | ICD-10-CM

## 2016-09-05 LAB — CBC WITH DIFFERENTIAL/PLATELET
BASOS ABS: 0 {cells}/uL (ref 0–200)
BASOS PCT: 0 %
EOS ABS: 240 {cells}/uL (ref 15–500)
Eosinophils Relative: 4 %
HCT: 35.3 % (ref 35.0–45.0)
HEMOGLOBIN: 11.6 g/dL — AB (ref 11.7–15.5)
LYMPHS ABS: 1980 {cells}/uL (ref 850–3900)
Lymphocytes Relative: 33 %
MCH: 29.7 pg (ref 27.0–33.0)
MCHC: 32.9 g/dL (ref 32.0–36.0)
MCV: 90.5 fL (ref 80.0–100.0)
MPV: 9 fL (ref 7.5–12.5)
Monocytes Absolute: 360 cells/uL (ref 200–950)
Monocytes Relative: 6 %
NEUTROS ABS: 3420 {cells}/uL (ref 1500–7800)
Neutrophils Relative %: 57 %
PLATELETS: 323 10*3/uL (ref 140–400)
RBC: 3.9 MIL/uL (ref 3.80–5.10)
RDW: 13.6 % (ref 11.0–15.0)
WBC: 6 10*3/uL (ref 3.8–10.8)

## 2016-09-05 LAB — HEPATIC FUNCTION PANEL
ALK PHOS: 65 U/L (ref 33–130)
ALT: 12 U/L (ref 6–29)
AST: 15 U/L (ref 10–35)
Albumin: 3.7 g/dL (ref 3.6–5.1)
BILIRUBIN DIRECT: 0 mg/dL (ref <=0.2)
BILIRUBIN TOTAL: 0.2 mg/dL (ref 0.2–1.2)
Indirect Bilirubin: 0.2 mg/dL (ref 0.2–1.2)
Total Protein: 6.9 g/dL (ref 6.1–8.1)

## 2016-09-05 LAB — BASIC METABOLIC PANEL WITH GFR
BUN: 25 mg/dL (ref 7–25)
CHLORIDE: 108 mmol/L (ref 98–110)
CO2: 24 mmol/L (ref 20–31)
CREATININE: 1.27 mg/dL — AB (ref 0.60–0.88)
Calcium: 10 mg/dL (ref 8.6–10.4)
GFR, Est African American: 43 mL/min — ABNORMAL LOW (ref >=60)
GFR, Est Non African American: 38 mL/min — ABNORMAL LOW (ref >=60)
Glucose, Bld: 79 mg/dL (ref 65–99)
POTASSIUM: 5.1 mmol/L (ref 3.5–5.3)
SODIUM: 140 mmol/L (ref 135–146)

## 2016-09-05 LAB — LIPID PANEL
CHOL/HDL RATIO: 2.7 ratio (ref <=5.0)
CHOLESTEROL: 159 mg/dL (ref 125–200)
HDL: 60 mg/dL (ref >=46)
LDL Cholesterol: 62 mg/dL (ref <130)
Triglycerides: 185 mg/dL — ABNORMAL HIGH (ref <150)
VLDL: 37 mg/dL — AB (ref <30)

## 2016-09-05 LAB — TSH: TSH: 1.36 m[IU]/L

## 2016-09-05 NOTE — Progress Notes (Signed)
MEDICARE ANNUAL WELLNESS VISIT AND FOLLOW UP  Assessment:    1. Chronic atrial fibrillation (HCC) -on anticoagulation -on rate control -has pacemaker  2. Essential hypertension -cont meds -followed by cards -dash diet -exercise as tolerated - TSH  3. Type 2 diabetes mellitus with diabetic neuropathy, without long-term current use of insulin (HCC) -cont meds -currently well controlled -check CBGs - Hemoglobin A1c  4. T2_NIDDM w/Stage 3 CKD (GFR 40 ml/min) -cont meds -well controlled - Hemoglobin A1c  5. Mixed hyperlipidemia -cont diet and exercise -cont current therapy - Lipid panel  6. Vitamin D deficiency -Cont Vit D  7. Medication management  - CBC with Differential/Platelet - BASIC METABOLIC PANEL WITH GFR - Hepatic function panel  8. Hypertensive heart disease without heart failure -followed by cards  9. PAF (paroxysmal atrial fibrillation) ([place]) -cont following with cards  10. Sick sinus syndrome ([place]) -followed by cards  11. Tachycardia-bradycardia ([place]) -followed by cards  12. Chronic vasomotor rhinitis -cont atrovent nasal spray prn  13. Basal cell carcinoma of skin of face, unspecified part of face -removed by derm  14. Abnormal finding on thyroid function test -TSH  15. BMI 25.0-25.9,adult -well controlled  16. Degeneration macular -followed by opthalmology  17. Encounter for therapeutic drug monitoring -check labs biyearly  18. History of anticoagulant therapy -followed by cards  19. History of surgical procedure -see surgical history   20. Long term current use of anticoagulant therapy -cont coumadin  21. Polypharmacy -try to minimize medications  22. Pseudophakia -followed by neuro  23. Retinopathy -followed by opthalmology    Over 30 minutes of exam, counseling, chart review, and critical decision making was performed  No future appointments.  Plan:   During the course of the visit the patient was educated  and counseled about appropriate screening and preventive services including:    Pneumococcal vaccine   Influenza vaccine  Td vaccine  Prevnar 13  Screening electrocardiogram  Screening mammography  Bone densitometry screening  Colorectal cancer screening  Diabetes screening  Glaucoma screening  Nutrition counseling   Advanced directives: given info/requested copies   Subjective:   Kylie Sanchez is a 82 y.o. female who presents for Medicare Annual Wellness Visit and 3 month follow up on hypertension, prediabetes, hyperlipidemia, vitamin D def.   Her blood pressure has been controlled at home, today their BP is BP: 124/60 She does not workout. She denies chest pain, shortness of breath, dizziness.  She notes that she has been having some issues with her lower back.  She reports that this prevents her from doing exercise.    She is on cholesterol medication and denies myalgias. Her cholesterol is at goal. The cholesterol last visit was:   Lab Results  Component Value Date   CHOL 173 05/08/2016   HDL 75 05/08/2016   LDLCALC 70 05/08/2016   TRIG 140 05/08/2016   CHOLHDL 2.3 05/08/2016   She has not been working on diet and exercise for diabetes, and denies foot ulcerations, hyperglycemia, hypoglycemia , increased appetite, nausea, paresthesia of the feet, polydipsia, polyuria, visual disturbances, vomiting and weight loss. Last A1C in the office was:  Lab Results  Component Value Date   HGBA1C 6.4 (H) 05/08/2016   Last GFR Lab Results  Component Value Date   GFRNONAA 40 (L) 05/08/2016   Lab Results  Component Value Date   GFRAA 47 (L) 05/08/2016   Patient is on Vitamin D supplement. Lab Results  Component Value Date   VD25OH 43 09/26/2015  She reports that she has been having some feelings of getting shakey.  She reports that she has that in between her breakfast and lunch.  She reports she thinks that this is causing her to have low blood sugars but she  doesn't check them.    She reports that she has been following with the cardiologist for her coumadin checks.  Her levels have been good.   Medication Review Current Outpatient Prescriptions on File Prior to Visit  Medication Sig Dispense Refill  . amLODipine (NORVASC) 10 MG tablet TAKE ONE TABLET BY MOUTH EVERY DAY 90 tablet 2  . b complex vitamins tablet Take 1 tablet by mouth daily.    . Coenzyme Q10 (CO Q 10) 100 MG CAPS Take 1 capsule by mouth daily.    Marland Kitchen glimepiride (AMARYL) 1 MG tablet TAKE 1 TABLET BY MOUTH EVERY DAY 90 tablet 0  . losartan (COZAAR) 100 MG tablet Take 1 tablet (100 mg total) by mouth daily. 30 tablet 10  . Magnesium 250 MG TABS Take 1 tablet by mouth daily.     . metFORMIN (GLUCOPHAGE) 500 MG tablet TAKE 2 TABLETS BY MOUTH TWICE DAILY 120 tablet 0  . metoprolol (TOPROL-XL) 200 MG 24 hr tablet TAKE 1 TABLET BY MOUTH DAILY 90 tablet 0  . nitroGLYCERIN (NITROSTAT) 0.4 MG SL tablet Place 1 tablet (0.4 mg total) under the tongue every 5 (five) minutes as needed for chest pain. 100 tablet 3  . ONE TOUCH ULTRA TEST test strip USE ONE STRIP TO TEST BLOOD GLUCOSE TWICE DAILY 100 each 6  . warfarin (COUMADIN) 5 MG tablet TAKE 1/2 TO 1 TABLET BY MOUTH DAILY AS DIRECTED BY COUMADIN CLINIC 30 tablet 0   No current facility-administered medications on file prior to visit.     Allergies: No Known Allergies  Current Problems (verified) has PAF (paroxysmal atrial fibrillation) ([place]); Hypertensive cardiovascular disease; Tachycardia-bradycardia ([place]); Long term current use of anticoagulant therapy; Hyperlipidemia; Sick sinus syndrome ([place]); Retinopathy; HTN (hypertension); T2_NIDDM w/Stage 3 CKD (GFR 40 ml/min); Vitamin D deficiency; Medication management; Atrial fibrillation [I48.91]; Basal cell carcinoma; DM neuropathy, type II diabetes mellitus ([place]); BMI 25.0-25.9,adult; Abnormal finding on thyroid function test; Chronic rhinitis; Diabetes mellitus ([place]); Encounter for  therapeutic drug monitoring; History of surgical procedure; History of anticoagulant therapy; BP (high blood pressure); HCD (hypertensive cardiovascular disease); Degeneration macular; Polypharmacy; and Pseudoaphakia on her problem list.  Screening Tests  There is no immunization history on file for this patient.  Preventative care: Last colonoscopy:  Declines Last mammogram: Declines mammograms Last pap smear/pelvic exam: Remote, refuses further  DEXA:2015  Prior vaccinations:  PATIENT DECLINES ALL VACCINES   Names of Other Physician/Practitioners you currently use: 1. [facility]- here for primary care 2. Dr. Baird Cancer, eye doctor, last visit 2017 3. Does see a dentist but does not know the dentist name, dentist, last visit 2016 Patient Care Team: Unk Pinto, MD as PCP - General (Internal Medicine) Sherlynn Stalls, MD as Consulting Physician (Ophthalmology)  Surgical: She  has a past surgical history that includes Pacemaker insertion (04/20/12); Dental surgery; and permanent pacemaker insertion (N/A, 04/20/2012). Family Her family history includes Cancer in her other; Heart attack in her father; Hypertension in her mother. Social history  She reports that she has never smoked. She does not have any smokeless tobacco history on file. She reports that she does not drink alcohol or use drugs.  MEDICARE WELLNESS OBJECTIVES: Physical activity:   Cardiac risk factors:  Depression/mood screen:   Depression screen [facility] 2/9 12/28/2015  Decreased Interest 0  Down, Depressed, Hopeless 0  PHQ - 2 Score 0  Altered sleeping 0  Tired, decreased energy 0  Change in appetite 0  Feeling bad or failure about yourself  0  Trouble concentrating 0  Moving slowly or fidgety/restless 0  Suicidal thoughts 0  PHQ-9 Score 0  Difficult doing work/chores Not difficult at all    ADLs:  In your present state of health, do you have any difficulty performing the  following activities: 12/28/2015 09/26/2015  Hearing? N N  Vision? Y Y  Difficulty concentrating or making decisions? N N  Walking or climbing stairs? N N  Dressing or bathing? N N  Doing errands, shopping? N N  Preparing Food and eating ? N -  Using the Toilet? N -  In the past six months, have you accidently leaked urine? N -  Do you have problems with loss of bowel control? N -  Managing your Medications? N -  Managing your Finances? N -  Housekeeping or managing your Housekeeping? N -  Some recent data might be hidden     Cognitive Testing  Alert? Yes  Normal Appearance?Yes  Oriented to person? Yes  Place? Yes   Time? Yes  Recall of three objects?  Yes  Can perform simple calculations? Yes  Displays appropriate judgment?Yes  Can read the correct time from a watch face?Yes  EOL planning:     Objective:   Today's Vitals   09/05/16 1147  BP: 124/60  Pulse: 72  Resp: 14  Temp: 98 F (36.7 C)  TempSrc: Temporal  Weight: 144 lb (65.3 kg)  Height: 5\' 3"  (1.6 m)   Body mass index is 25.51 kg/m.  General appearance: alert, no distress, WD/WN,  female HEENT: normocephalic, sclerae anicteric, TMs pearly, nares patent, no discharge or erythema, pharynx normal.  Left eye enucleated.  There is an unhealed stoma below the left eye socket to the sinuses which is covered with a bandage.   Oral cavity: MMM, no lesions Neck: supple, no lymphadenopathy, no thyromegaly, no masses Heart: RRR, normal S1, S2, no murmurs Lungs: CTA bilaterally, no wheezes, rhonchi, or rales Abdomen: +bs, soft, non tender, non distended, no masses, no hepatomegaly, no splenomegaly Musculoskeletal: nontender, no swelling, no obvious deformity Extremities: no edema, no cyanosis, no clubbing Pulses: 2+ symmetric, upper and lower extremities, normal cap refill Neurological: alert, oriented x 3, CN2-12 intact, strength normal upper extremities and lower extremities, sensation normal throughout, DTRs 2+  throughout, no cerebellar signs, gait normal Psychiatric: normal affect, behavior normal, pleasant  Breast: defer Gyn: defer Rectal: defer   Medicare Attestation I have personally reviewed: The patient's medical and social history Their use of alcohol, tobacco or illicit drugs Their current medications and supplements The patient's functional ability including ADLs,fall risks, home safety risks, cognitive, and hearing and visual impairment Diet and physical activities Evidence for depression or mood disorders  The patient's weight, height, BMI, and visual acuity have been recorded in the chart.  I have made referrals, counseling, and provided education to the patient based on review of the above and I have provided the patient with a written personalized care plan for preventive services.     Starlyn Skeans, PA-C   09/05/2016

## 2016-09-05 NOTE — Patient Instructions (Signed)
Please discontinue your glimeperide at breakfast as I think that this is dropping your blood sugars too low.  Please check your blood sugars in the morning for the next few weeks.  We will want to see you between 100-125 first thing in the morning.

## 2016-09-06 LAB — HEMOGLOBIN A1C
HEMOGLOBIN A1C: 6 % — AB (ref <5.7)
MEAN PLASMA GLUCOSE: 126 mg/dL

## 2016-09-13 DIAGNOSIS — H353112 Nonexudative age-related macular degeneration, right eye, intermediate dry stage: Secondary | ICD-10-CM | POA: Diagnosis not present

## 2016-09-13 DIAGNOSIS — Z961 Presence of intraocular lens: Secondary | ICD-10-CM | POA: Diagnosis not present

## 2016-09-17 DIAGNOSIS — H53411 Scotoma involving central area, right eye: Secondary | ICD-10-CM | POA: Diagnosis not present

## 2016-09-17 DIAGNOSIS — E113391 Type 2 diabetes mellitus with moderate nonproliferative diabetic retinopathy without macular edema, right eye: Secondary | ICD-10-CM | POA: Diagnosis not present

## 2016-09-17 DIAGNOSIS — H353212 Exudative age-related macular degeneration, right eye, with inactive choroidal neovascularization: Secondary | ICD-10-CM | POA: Diagnosis not present

## 2016-09-17 DIAGNOSIS — H43811 Vitreous degeneration, right eye: Secondary | ICD-10-CM | POA: Diagnosis not present

## 2016-09-18 ENCOUNTER — Other Ambulatory Visit: Payer: Self-pay | Admitting: Internal Medicine

## 2016-09-18 ENCOUNTER — Other Ambulatory Visit: Payer: Self-pay | Admitting: Physician Assistant

## 2016-09-18 ENCOUNTER — Other Ambulatory Visit: Payer: Self-pay | Admitting: *Deleted

## 2016-09-18 MED ORDER — METFORMIN HCL 500 MG PO TABS: 1000.0000 mg | ORAL_TABLET | Freq: Two times a day (BID) | ORAL | 1 refills | Status: DC

## 2016-09-30 ENCOUNTER — Other Ambulatory Visit: Payer: Self-pay | Admitting: Internal Medicine

## 2016-10-08 ENCOUNTER — Ambulatory Visit (INDEPENDENT_AMBULATORY_CARE_PROVIDER_SITE_OTHER): Payer: Medicare Other | Admitting: *Deleted

## 2016-10-08 DIAGNOSIS — I482 Chronic atrial fibrillation, unspecified: Secondary | ICD-10-CM

## 2016-10-08 DIAGNOSIS — I495 Sick sinus syndrome: Secondary | ICD-10-CM | POA: Diagnosis not present

## 2016-10-08 DIAGNOSIS — I208 Other forms of angina pectoris: Secondary | ICD-10-CM

## 2016-10-08 DIAGNOSIS — Z5181 Encounter for therapeutic drug level monitoring: Secondary | ICD-10-CM

## 2016-10-08 LAB — POCT INR: INR: 1.5

## 2016-10-08 MED ORDER — WARFARIN SODIUM 5 MG PO TABS: ORAL_TABLET | ORAL | 0 refills | Status: DC

## 2016-10-09 NOTE — Progress Notes (Signed)
Remote pacemaker transmission.   

## 2016-10-10 ENCOUNTER — Encounter: Payer: Self-pay | Admitting: Cardiology

## 2016-11-06 ENCOUNTER — Other Ambulatory Visit: Payer: Self-pay

## 2016-11-06 LAB — CUP PACEART REMOTE DEVICE CHECK
Battery Impedance: 257 Ohm
Battery Voltage: 2.78 V
Brady Statistic AP VS Percent: 2 %
Implantable Lead Implant Date: 20130610
Implantable Lead Model: 5076
Implantable Lead Model: 5092
Implantable Pulse Generator Implant Date: 20130610
Lead Channel Pacing Threshold Amplitude: 0.625 V
Lead Channel Pacing Threshold Pulse Width: 0.4 ms
Lead Channel Setting Pacing Amplitude: 2.5 V
Lead Channel Setting Pacing Pulse Width: 0.4 ms
Lead Channel Setting Sensing Sensitivity: 5.6 mV
MDC IDC LEAD IMPLANT DT: 20130610
MDC IDC LEAD LOCATION: 753859
MDC IDC LEAD LOCATION: 753860
MDC IDC MSMT BATTERY REMAINING LONGEVITY: 127 mo
MDC IDC MSMT LEADCHNL RA IMPEDANCE VALUE: 323 Ohm
MDC IDC MSMT LEADCHNL RV IMPEDANCE VALUE: 836 Ohm
MDC IDC MSMT LEADCHNL RV PACING THRESHOLD AMPLITUDE: 0.75 V
MDC IDC MSMT LEADCHNL RV PACING THRESHOLD PULSEWIDTH: 0.4 ms
MDC IDC SESS DTM: 20171128174904
MDC IDC SET LEADCHNL RA PACING AMPLITUDE: 2 V
MDC IDC STAT BRADY AP VP PERCENT: 0 %
MDC IDC STAT BRADY AS VP PERCENT: 0 %
MDC IDC STAT BRADY AS VS PERCENT: 98 %

## 2016-11-06 MED ORDER — METOPROLOL SUCCINATE ER 200 MG PO TB24: 200.0000 mg | ORAL_TABLET | Freq: Every day | ORAL | 0 refills | Status: DC

## 2016-11-14 ENCOUNTER — Encounter: Payer: Medicare Other | Admitting: Nurse Practitioner

## 2016-11-15 ENCOUNTER — Encounter: Payer: Self-pay | Admitting: *Deleted

## 2016-11-18 ENCOUNTER — Other Ambulatory Visit: Payer: Self-pay | Admitting: Internal Medicine

## 2016-11-18 ENCOUNTER — Other Ambulatory Visit: Payer: Self-pay

## 2016-11-18 MED ORDER — WARFARIN SODIUM 5 MG PO TABS: ORAL_TABLET | ORAL | 0 refills | Status: DC

## 2016-11-18 NOTE — Telephone Encounter (Signed)
Rx refill sent to pharmacy. 

## 2016-12-05 DIAGNOSIS — H40011 Open angle with borderline findings, low risk, right eye: Secondary | ICD-10-CM | POA: Diagnosis not present

## 2016-12-11 ENCOUNTER — Ambulatory Visit: Payer: Self-pay | Admitting: Internal Medicine

## 2016-12-18 ENCOUNTER — Ambulatory Visit: Payer: Self-pay | Admitting: Internal Medicine

## 2016-12-25 ENCOUNTER — Ambulatory Visit (INDEPENDENT_AMBULATORY_CARE_PROVIDER_SITE_OTHER): Payer: Medicare Other | Admitting: Internal Medicine

## 2016-12-25 ENCOUNTER — Encounter: Payer: Self-pay | Admitting: Internal Medicine

## 2016-12-25 VITALS — BP 140/66 | HR 68 | Temp 97.8°F | Resp 14 | Ht 63.0 in | Wt 142.0 lb

## 2016-12-25 DIAGNOSIS — I495 Sick sinus syndrome: Secondary | ICD-10-CM | POA: Diagnosis not present

## 2016-12-25 DIAGNOSIS — E1121 Type 2 diabetes mellitus with diabetic nephropathy: Secondary | ICD-10-CM

## 2016-12-25 DIAGNOSIS — I480 Paroxysmal atrial fibrillation: Secondary | ICD-10-CM | POA: Diagnosis not present

## 2016-12-25 DIAGNOSIS — I482 Chronic atrial fibrillation, unspecified: Secondary | ICD-10-CM

## 2016-12-25 DIAGNOSIS — E1140 Type 2 diabetes mellitus with diabetic neuropathy, unspecified: Secondary | ICD-10-CM | POA: Diagnosis not present

## 2016-12-25 DIAGNOSIS — E782 Mixed hyperlipidemia: Secondary | ICD-10-CM | POA: Diagnosis not present

## 2016-12-25 NOTE — Progress Notes (Signed)
Assessment and Plan:  Hypertension:  -Continue medication,  -monitor blood pressure at home.  -Continue DASH diet.   -Reminder to go to the ER if any CP, SOB, nausea, dizziness, severe HA, changes vision/speech, left arm numbness and tingling, and jaw pain.  Chronic atrial fibrillation -no palpitations -followed by cardiology  Cholesterol: -Continue diet and exercise.   Diabetes with retinopathy: -recommended continued tight control of blood sugars with continued medication, consistency of diet and also checking CBGs at home -no need to check A1c level today given very tight control and continued routine compliance. -Continue diet and exercise.   Vitamin D Def -continue medications.   GERD -try tums or pepcid/ranitidine BID -patient to let us know if happens more frequently.    Vision Changes -recommended calling Dr. Tye Savoy her retinal specialist    Continue diet and meds as discussed. Further disposition pending results of labs.  HPI 83 y.o. female  presents for 3 month follow up with hypertension, hyperlipidemia, prediabetes and vitamin D.   Her blood pressure has been controlled at home, today their BP is BP: 140/66.   She does not workout. She denies chest pain, shortness of breath, dizziness.  She notes that she doesn't get episodes of chest pain except after she eats a really large meal and this is associated with water brash and sometimes burning.  It happens with both rest and activity.    She is on cholesterol medication and denies myalgias. Her cholesterol is at goal. The cholesterol last visit was:   Lab Results  Component Value Date   CHOL 159 09/05/2016   HDL 60 09/05/2016   LDLCALC 62 09/05/2016   TRIG 185 (H) 09/05/2016   CHOLHDL 2.7 09/05/2016     She has been working on diet and exercise for prediabetes, and denies foot ulcerations, hyperglycemia, hypoglycemia , increased appetite, nausea, paresthesia of the feet, polydipsia, polyuria, visual  disturbances, vomiting and weight loss. Last A1C in the office was:  Lab Results  Component Value Date   HGBA1C 6.0 (H) 09/05/2016    Patient is on Vitamin D supplement.  Lab Results  Component Value Date   VD25OH 29 09/26/2015     She notes that the vision from her remaining eye seems to be getting worse.  She has seen Dr. Katy Fitch and Dr. Baird Cancer and they have not given her a new prescripiton.  Her stoma to her sinuses are no longer draining.  She is rinsing it with saline as often as tolerated.     Current Medications:  Current Outpatient Prescriptions on File Prior to Visit  Medication Sig Dispense Refill  . amLODipine (NORVASC) 10 MG tablet TAKE ONE TABLET BY MOUTH EVERY DAY 90 tablet 2  . b complex vitamins tablet Take 1 tablet by mouth daily.    . Coenzyme Q10 (CO Q 10) 100 MG CAPS Take 1 capsule by mouth daily.    Marland Kitchen glimepiride (AMARYL) 1 MG tablet TAKE 1 TABLET BY MOUTH EVERY DAY 90 tablet 0  . losartan (COZAAR) 100 MG tablet TAKE ONE (1) TABLET BY MOUTH EVERY DAY 30 tablet 0  . Magnesium 250 MG TABS Take 1 tablet by mouth daily.     . metFORMIN (GLUCOPHAGE) 500 MG tablet TAKE 2 TABLETS(1000 MG) BY MOUTH TWICE DAILY 360 tablet 1  . metoprolol (TOPROL-XL) 200 MG 24 hr tablet Take 1 tablet (200 mg total) by mouth daily. 90 tablet 0  . nitroGLYCERIN (NITROSTAT) 0.4 MG SL tablet Place 1 tablet (0.4 mg total)  under the tongue every 5 (five) minutes as needed for chest pain. 100 tablet 3  . ONE TOUCH ULTRA TEST test strip USE ONE STRIP TO TEST BLOOD GLUCOSE TWICE DAILY 100 each 6  . warfarin (COUMADIN) 5 MG tablet Take as directed by coumadin clinic 30 tablet 0   No current facility-administered medications on file prior to visit.     Medical History:  Past Medical History:  Diagnosis Date  . Cataract   . CKD (chronic kidney disease) stage 3, GFR 30-59 ml/min    due to DM  . Dizziness   . Hyperlipidemia   . Hypertensive cardiovascular disease   . Paroxysmal atrial  fibrillation (HCC)   . Retinopathy   . Sick sinus syndrome ([place])   . Type II or unspecified type diabetes mellitus without mention of complication, not stated as uncontrolled     Allergies: No Known Allergies   Review of Systems:  Review of Systems  Constitutional: Negative for chills, fever and malaise/fatigue.  HENT: Negative for congestion, ear pain and sore throat.   Eyes: Negative.   Respiratory: Negative for cough, shortness of breath and wheezing.   Cardiovascular: Negative for chest pain, palpitations and leg swelling.  Gastrointestinal: Negative for abdominal pain, blood in stool, constipation, diarrhea, heartburn and melena.  Genitourinary: Negative.   Skin: Negative.   Neurological: Negative for dizziness, sensory change, loss of consciousness and headaches.  Psychiatric/Behavioral: Negative for depression. The patient is not nervous/anxious and does not have insomnia.     Family history- Review and unchanged  Social history- Review and unchanged  Physical Exam: BP 140/66   Pulse 68   Temp 97.8 F (36.6 C) (Temporal)   Resp 14   Ht 5\' 3"  (1.6 m)   Wt 142 lb (64.4 kg)   BMI 25.15 kg/m  Wt Readings from Last 3 Encounters:  12/25/16 142 lb (64.4 kg)  09/05/16 144 lb (65.3 kg)  05/08/16 145 lb 3.2 oz (65.9 kg)    General Appearance: Well nourished well developed, in no apparent distress. Eyes: Left eye enucleated and surgically absent.  Bandaid present over left medial surgical stoma.   ENT/Mouth: Ear canals normal without obstruction, swelling, erythma, discharge.  TMs normal bilaterally.  Oropharynx moist, clear, without exudate, or postoropharyngeal swelling. Neck: Supple, thyroid normal,no cervical adenopathy  Respiratory: Respiratory effort normal, Breath sounds clear A&P without rhonchi, wheeze, or rale.  No retractions, no accessory usage. Cardio: RRR with no MRGs. Brisk peripheral pulses without edema.  Abdomen: Soft, + BS,  Non tender, no guarding,  rebound, hernias, masses. Musculoskeletal: Full ROM, 5/5 strength, slow gait with good stability with walking Skin: Warm, dry without rashes, lesions, ecchymosis.  Neuro: Awake and oriented X 3, Cranial nerves intact. Normal muscle tone, no cerebellar symptoms. Psych: Normal affect, Insight and Judgment appropriate.    Starlyn Skeans, PA-C 11:20 AM [facility]

## 2016-12-26 ENCOUNTER — Telehealth: Payer: Self-pay

## 2016-12-26 ENCOUNTER — Other Ambulatory Visit: Payer: Self-pay | Admitting: Internal Medicine

## 2016-12-26 NOTE — Telephone Encounter (Signed)
Pt call stating that she has had some weakness,wobbly, & sweaty. Battery in meter is dead as well Took 2 Metformins this morning.   Per provider pt should stop glimepiride & metformin tonight & tomorrow. Pt should follow up in office tomorrow If sxs get worse then she should go to the ER Pt agreed & was transferred to front office to schedule that appt.

## 2016-12-27 ENCOUNTER — Encounter: Payer: Self-pay | Admitting: Physician Assistant

## 2016-12-27 ENCOUNTER — Ambulatory Visit (INDEPENDENT_AMBULATORY_CARE_PROVIDER_SITE_OTHER): Payer: Medicare Other | Admitting: Physician Assistant

## 2016-12-27 VITALS — BP 136/70 | HR 90 | Temp 97.9°F | Resp 14 | Ht 63.0 in | Wt 146.0 lb

## 2016-12-27 DIAGNOSIS — E1121 Type 2 diabetes mellitus with diabetic nephropathy: Secondary | ICD-10-CM | POA: Diagnosis not present

## 2016-12-27 NOTE — Patient Instructions (Addendum)
STOP the AMARYL/GLIMEPIRIDE Continue the metformin  Can keep record of sugar and blood pressure for 1-2 weeks.    Your A1C is a measure of your sugar over the past 3 months and is not affected by what you have eaten over the past few days. Diabetes increases your chances of stroke and heart attack over 300 % and is the leading cause of blindness and kidney failure in the [place]. Please make sure you decrease bad carbs like white bread, white rice, potatoes, corn, soft drinks, pasta, cereals, refined sugars, sweet tea, dried fruits, and fruit juice. Good carbs are okay to eat in moderation like sweet potatoes, brown rice, whole grain pasta/bread, most fruit (except dried fruit) and you can eat as many veggies as you want.   Greater than 6.5 is considered diabetic. Between 6.4 and 5.7 is prediabetic If your A1C is less than 5.7 you are NOT diabetic.  Targets for Glucose Readings: Time of Check Target for patients WITHOUT Diabetes Target for DIABETICS  Before Meals/fasting in the morning Less than 100  less than 150  Two hours after meals Less than 200  Less than 250     Hypoglycemia Hypoglycemia occurs when the level of sugar (glucose) in the blood is too low. Glucose is a type of sugar that provides the body's main source of energy. Certain hormones (insulin and glucagon) control the level of glucose in the blood. Insulin lowers blood glucose, and glucagon increases blood glucose. Hypoglycemia can result from having too much insulin in the bloodstream, or from not eating enough food that contains glucose. Hypoglycemia can happen in people who do or do not have diabetes. It can develop quickly, and it can be a medical emergency. What are the causes? Hypoglycemia occurs most often in people who have diabetes. If you have diabetes, hypoglycemia may be caused by:  Diabetes medicine.  Not eating enough, or not eating often enough.  Increased physical activity.  Drinking alcohol,  especially when you have not eaten recently. If you do not have diabetes, hypoglycemia may be caused by:  A tumor in the pancreas. The pancreas is the organ that makes insulin.  Not eating enough, or not eating for long periods at a time (fasting).  Severe infection or illness that affects the liver, heart, or kidneys.  Certain medicines. You may also have reactive hypoglycemia. This condition causes hypoglycemia within 4 hours of eating a meal. This may occur after having stomach surgery. Sometimes, the cause of reactive hypoglycemia is not known. What increases the risk? Hypoglycemia is more likely to develop in:  People who have diabetes and take medicines to lower blood glucose.  People who abuse alcohol.  People who have a severe illness. What are the signs or symptoms? Hypoglycemia may not cause any symptoms. If you have symptoms, they may include:  Hunger.  Anxiety.  Sweating and feeling clammy.  Confusion.  Dizziness or feeling light-headed.  Sleepiness.  Nausea.  Increased heart rate.  Headache.  Blurry vision.  Seizure.  Nightmares.  Tingling or numbness around the mouth, lips, or tongue.  A change in speech.  Decreased ability to concentrate.  A change in coordination.  Restless sleep.  Tremors or shakes.  Fainting.  Irritability. How is this diagnosed? Hypoglycemia is diagnosed with a blood test to measure your blood glucose level. This blood test is done while you are having symptoms. Your health care provider may also do a physical exam and review your medical history. If you do  not have diabetes, other tests may be done to find the cause of your hypoglycemia. How is this treated? This condition can often be treated by immediately eating or drinking something that contains glucose, such as:  3-4 sugar tablets (glucose pills).  Glucose gel, 15-gram tube.  Fruit juice, 4 oz (120 mL).  Regular soda (not diet soda), 4 oz (120  mL).  Low-fat milk, 4 oz (120 mL).  Several pieces of hard candy.  Sugar or honey, 1 Tbsp. Treating Hypoglycemia If You Have Diabetes  If you are alert and able to swallow safely, follow the 15:15 rule:  Take 15 grams of a rapid-acting carbohydrate. Rapid-acting options include:  1 tube of glucose gel.  3 glucose pills.  6-8 pieces of hard candy.  4 oz (120 mL) of fruit juice.  4 oz (120 ml) of regular (not diet) soda.  Check your blood glucose 15 minutes after you take the carbohydrate.  If the repeat blood glucose level is still at or below 70 mg/dL (3.9 mmol/L), take 15 grams of a carbohydrate again.  If your blood glucose level does not increase above 70 mg/dL (3.9 mmol/L) after 3 tries, seek emergency medical care.  After your blood glucose level returns to normal, eat a meal or a snack within 1 hour. Treating Severe Hypoglycemia  Severe hypoglycemia is when your blood glucose level is at or below 54 mg/dL (3 mmol/L). Severe hypoglycemia is an emergency. Do not wait to see if the symptoms will go away. Get medical help right away. Call your local emergency services (911 in the [place] Do not drive yourself to the hospital. If you have severe hypoglycemia and you cannot eat or drink, you may need an injection of glucagon. A family member or close friend should learn how to check your blood glucose and how to give you a glucagon injection. Ask your health care provider if you need to have an emergency glucagon injection kit available. Severe hypoglycemia may need to be treated in a hospital. The treatment may include getting glucose through an IV tube. You may also need treatment for the cause of your hypoglycemia. Follow these instructions at home: General instructions  Avoid any diets that cause you to not eat enough food. Talk with your health care provider before you start any new diet.  Take over-the-counter and prescription medicines only as told by your health care  provider.  Limit alcohol intake to no more than 1 drink per day for nonpregnant women and 2 drinks per day for men. One drink equals 12 oz of beer, 5 oz of wine, or 1 oz of hard liquor.  Keep all follow-up visits as told by your health care provider. This is important. If You Have Diabetes:   Make sure you know the symptoms of hypoglycemia.  Always have a rapid-acting carbohydrate snack with you to treat low blood sugar.  Follow your diabetes management plan, as told by your health care provider. Make sure you:  Take your medicines as directed.  Follow your exercise plan.  Follow your meal plan. Eat on time, and do not skip meals.  Check your blood glucose as often as directed. Make sure to check your blood glucose before and after exercise. If you exercise longer or in a different way than usual, check your blood glucose more often.  Follow your sick day plan whenever you cannot eat or drink normally. Make this plan in advance with your health care provider.  Share your diabetes management  plan with people in your workplace, school, and household.  Check your urine for ketones when you are ill and as told by your health care provider.  Carry a medical alert card or wear medical alert jewelry. If You Have Reactive Hypoglycemia or Low Blood Sugar From Other Causes:  Monitor your blood glucose as told by your health care provider.  Follow instructions from your health care provider about eating or drinking restrictions. Contact a health care provider if:  You have problems keeping your blood glucose in your target range.  You have frequent episodes of hypoglycemia. Get help right away if:  You continue to have hypoglycemia symptoms after eating or drinking something containing glucose.  Your blood glucose is at or below 54 mg/dL (3 mmol/L).  You have a seizure.  You faint. These symptoms may represent a serious problem that is an emergency. Do not wait to see if the  symptoms will go away. Get medical help right away. Call your local emergency services (911 in the [place] Do not drive yourself to the hospital.  This information is not intended to replace advice given to you by your health care provider. Make sure you discuss any questions you have with your health care provider. Document Released: 10/28/2005 Document Revised: 04/10/2016 Document Reviewed: 12/01/2015 Elsevier Interactive Patient Education  2017 [employer].

## 2016-12-27 NOTE — Progress Notes (Signed)
Assessment and Plan: 1. T2_NIDDM w/Stage 3 CKD (GFR 40 ml/min) Patient is eating better and kidney function is decreased, likely hypoglycemia Stop the Amaryl, continue metformin, montor BS and BP, discussed hypoglycemia, information given.  Follow up in May  Future Appointments Date Time Provider [place]  01/08/2017 11:20 AM [facility] DEVICE REMOTES [facility]  04/09/2017 11:45 AM Unk Pinto, MD [facility] None     HPI 83 y.o.female presents for follow up for her blood sugars. Her meter is broken at home and she called the office yesterday feeling shakey, sweaty, and dizzy, states she got better after PB&J sandwich with juice, told not to take any DM medications and follow up in the office today.   She did not take her night or morning medicine, states she is feeling much better today. She ate cheerios 2 hours ago and her sugar in the office right now is 190.   Blood pressure 136/70, pulse 90, temperature 97.9 F (36.6 C), resp. rate 14, height 5\' 3"  (1.6 m), weight 146 lb (66.2 kg), SpO2 95 %.    Lab Results  Component Value Date   GFRNONAA 49 (L) 09/05/2016   Lab Results  Component Value Date   HGBA1C 6.0 (H) 09/05/2016    Past Medical History:  Diagnosis Date  . Cataract   . CKD (chronic kidney disease) stage 3, GFR 30-59 ml/min    due to DM  . Dizziness   . Hyperlipidemia   . Hypertensive cardiovascular disease   . Paroxysmal atrial fibrillation (HCC)   . Retinopathy   . Sick sinus syndrome ([place])   . Type II or unspecified type diabetes mellitus without mention of complication, not stated as uncontrolled      No Known Allergies  Current Outpatient Prescriptions on File Prior to Visit  Medication Sig  . amLODipine (NORVASC) 10 MG tablet TAKE ONE TABLET BY MOUTH EVERY DAY  . b complex vitamins tablet Take 1 tablet by mouth daily.  . Coenzyme Q10 (CO Q 10) 100 MG CAPS Take 1 capsule by mouth daily.  Marland Kitchen losartan (COZAAR) 100 MG tablet  Take 1 tablet (100 mg total) by mouth daily. *Patient is overdue for an appointment and needs to call and schedule for further refills*  . Magnesium 250 MG TABS Take 1 tablet by mouth daily.   . metFORMIN (GLUCOPHAGE) 500 MG tablet TAKE 2 TABLETS(1000 MG) BY MOUTH TWICE DAILY  . metoprolol (TOPROL-XL) 200 MG 24 hr tablet Take 1 tablet (200 mg total) by mouth daily.  . nitroGLYCERIN (NITROSTAT) 0.4 MG SL tablet Place 1 tablet (0.4 mg total) under the tongue every 5 (five) minutes as needed for chest pain.  . ONE TOUCH ULTRA TEST test strip USE ONE STRIP TO TEST BLOOD GLUCOSE TWICE DAILY  . warfarin (COUMADIN) 5 MG tablet Take as directed by coumadin clinic   No current facility-administered medications on file prior to visit.     ROS: all negative except above.   Physical Exam: Filed Weights   12/27/16 1114  Weight: 146 lb (66.2 kg)   BP 136/70   Pulse 90   Temp 97.9 F (36.6 C)   Resp 14   Ht 5\' 3"  (1.6 m)   Wt 146 lb (66.2 kg)   SpO2 95%   BMI 25.86 kg/m  HEENT: normocephalic, sclerae anicteric, TMs pearly, nares patent, no discharge or erythema, pharynx normal, left eye enucleated.  Oral cavity: MMM, no lesions Neck: supple, no lymphadenopathy, no thyromegaly, no masses Heart: RRR, normal  S1, S2, RSB systolic click/murmur, 2+ edema Lungs: CTA bilaterally, no wheezes, rhonchi, or rales Abdomen: +bs, soft, non tender, non distended, no masses, no hepatomegaly, no splenomegaly Musculoskeletal: nontender, no swelling, no obvious deformity Extremities: no edema, no cyanosis, no clubbing Pulses: 2+ symmetric, upper and lower extremities, normal cap refill Neurological: alert, oriented x 3, CN2-12 intact, decreased strenght upper extremities and lower extremities, sensation normal throughout, DTRs 2+ throughout, no cerebellar signs, gait antalgic with walker Psychiatric: normal affect, behavior normal, pleasant   Vicie Mutters, PA-C 12:55 PM [facility]

## 2017-01-07 ENCOUNTER — Other Ambulatory Visit: Payer: Self-pay | Admitting: *Deleted

## 2017-01-07 MED ORDER — ONETOUCH BASIC SYSTEM W/DEVICE KIT: PACK | 0 refills | Status: DC

## 2017-01-07 MED ORDER — ONETOUCH ULTRASOFT LANCETS MISC: 12 refills | Status: DC

## 2017-01-07 MED ORDER — GLUCOSE BLOOD VI STRP: ORAL_STRIP | 12 refills | Status: DC

## 2017-01-08 ENCOUNTER — Encounter: Payer: Medicare Other | Admitting: *Deleted

## 2017-01-08 ENCOUNTER — Telehealth: Payer: Self-pay | Admitting: Cardiology

## 2017-01-08 DIAGNOSIS — H353113 Nonexudative age-related macular degeneration, right eye, advanced atrophic without subfoveal involvement: Secondary | ICD-10-CM | POA: Diagnosis not present

## 2017-01-08 DIAGNOSIS — E113391 Type 2 diabetes mellitus with moderate nonproliferative diabetic retinopathy without macular edema, right eye: Secondary | ICD-10-CM | POA: Diagnosis not present

## 2017-01-08 DIAGNOSIS — H35421 Microcystoid degeneration of retina, right eye: Secondary | ICD-10-CM | POA: Diagnosis not present

## 2017-01-08 DIAGNOSIS — H35431 Paving stone degeneration of retina, right eye: Secondary | ICD-10-CM | POA: Diagnosis not present

## 2017-01-08 NOTE — Telephone Encounter (Signed)
Spoke with pt and reminded pt of remote transmission that is due today. Pt stated that she has not set up her monitor and she is not going to do set up the monitor. Pt agreed to an appt w/ device clinic on 5-30 at 11:00 AM.

## 2017-01-15 ENCOUNTER — Other Ambulatory Visit: Payer: Self-pay | Admitting: Internal Medicine

## 2017-01-15 ENCOUNTER — Ambulatory Visit: Payer: Self-pay | Admitting: Internal Medicine

## 2017-01-16 ENCOUNTER — Other Ambulatory Visit: Payer: Self-pay

## 2017-01-16 MED ORDER — LOSARTAN POTASSIUM 100 MG PO TABS: ORAL_TABLET | ORAL | 1 refills | Status: DC

## 2017-01-16 NOTE — Telephone Encounter (Signed)
Entered chart in order to refill (losartan) Note in chart that it was refilled on 3-8 18 for 15 pills already by Dr. Thompson Grayer.

## 2017-01-16 NOTE — Telephone Encounter (Signed)
Per provider OK to refill for 30 days w/ 1 refill due @ this time.

## 2017-02-04 ENCOUNTER — Other Ambulatory Visit: Payer: Self-pay | Admitting: Internal Medicine

## 2017-02-04 ENCOUNTER — Other Ambulatory Visit: Payer: Self-pay | Admitting: Physician Assistant

## 2017-02-08 ENCOUNTER — Other Ambulatory Visit: Payer: Self-pay | Admitting: Internal Medicine

## 2017-02-17 ENCOUNTER — Other Ambulatory Visit: Payer: Self-pay | Admitting: Physician Assistant

## 2017-03-26 ENCOUNTER — Other Ambulatory Visit: Payer: Self-pay | Admitting: Internal Medicine

## 2017-03-26 ENCOUNTER — Other Ambulatory Visit: Payer: Self-pay | Admitting: Physician Assistant

## 2017-03-27 ENCOUNTER — Encounter: Payer: Self-pay | Admitting: Physician Assistant

## 2017-04-03 DIAGNOSIS — H353112 Nonexudative age-related macular degeneration, right eye, intermediate dry stage: Secondary | ICD-10-CM | POA: Diagnosis not present

## 2017-04-03 DIAGNOSIS — Z961 Presence of intraocular lens: Secondary | ICD-10-CM | POA: Diagnosis not present

## 2017-04-09 ENCOUNTER — Encounter: Payer: Self-pay | Admitting: Internal Medicine

## 2017-04-09 ENCOUNTER — Ambulatory Visit (INDEPENDENT_AMBULATORY_CARE_PROVIDER_SITE_OTHER): Payer: Medicare Other | Admitting: Internal Medicine

## 2017-04-09 VITALS — BP 134/78 | HR 72 | Temp 97.7°F | Resp 16 | Ht 67.0 in | Wt 141.4 lb

## 2017-04-09 DIAGNOSIS — E559 Vitamin D deficiency, unspecified: Secondary | ICD-10-CM

## 2017-04-09 DIAGNOSIS — E782 Mixed hyperlipidemia: Secondary | ICD-10-CM | POA: Diagnosis not present

## 2017-04-09 DIAGNOSIS — Z7901 Long term (current) use of anticoagulants: Secondary | ICD-10-CM

## 2017-04-09 DIAGNOSIS — I482 Chronic atrial fibrillation, unspecified: Secondary | ICD-10-CM

## 2017-04-09 DIAGNOSIS — Z5181 Encounter for therapeutic drug level monitoring: Secondary | ICD-10-CM | POA: Diagnosis not present

## 2017-04-09 DIAGNOSIS — I10 Essential (primary) hypertension: Secondary | ICD-10-CM

## 2017-04-09 DIAGNOSIS — E1121 Type 2 diabetes mellitus with diabetic nephropathy: Secondary | ICD-10-CM | POA: Diagnosis not present

## 2017-04-09 DIAGNOSIS — Z79899 Other long term (current) drug therapy: Secondary | ICD-10-CM | POA: Diagnosis not present

## 2017-04-09 LAB — CBC WITH DIFFERENTIAL/PLATELET
BASOS PCT: 1 %
Basophils Absolute: 60 cells/uL (ref 0–200)
EOS ABS: 240 {cells}/uL (ref 15–500)
Eosinophils Relative: 4 %
HEMATOCRIT: 36.5 % (ref 35.0–45.0)
HEMOGLOBIN: 11.8 g/dL (ref 11.7–15.5)
LYMPHS ABS: 1980 {cells}/uL (ref 850–3900)
Lymphocytes Relative: 33 %
MCH: 29.1 pg (ref 27.0–33.0)
MCHC: 32.3 g/dL (ref 32.0–36.0)
MCV: 90.1 fL (ref 80.0–100.0)
MONO ABS: 420 {cells}/uL (ref 200–950)
MONOS PCT: 7 %
MPV: 9.2 fL (ref 7.5–12.5)
NEUTROS ABS: 3300 {cells}/uL (ref 1500–7800)
Neutrophils Relative %: 55 %
PLATELETS: 289 10*3/uL (ref 140–400)
RBC: 4.05 MIL/uL (ref 3.80–5.10)
RDW: 13.3 % (ref 11.0–15.0)
WBC: 6 10*3/uL (ref 3.8–10.8)

## 2017-04-09 LAB — BASIC METABOLIC PANEL WITH GFR
BUN: 25 mg/dL (ref 7–25)
CHLORIDE: 107 mmol/L (ref 98–110)
CO2: 21 mmol/L (ref 20–31)
Calcium: 9.7 mg/dL (ref 8.6–10.4)
Creat: 1.06 mg/dL — ABNORMAL HIGH (ref 0.60–0.88)
GFR, Est African American: 54 mL/min — ABNORMAL LOW (ref >=60)
GFR, Est Non African American: 47 mL/min — ABNORMAL LOW (ref >=60)
Glucose, Bld: 95 mg/dL (ref 65–99)
POTASSIUM: 5.1 mmol/L (ref 3.5–5.3)
Sodium: 139 mmol/L (ref 135–146)

## 2017-04-09 LAB — HEPATIC FUNCTION PANEL
ALBUMIN: 3.9 g/dL (ref 3.6–5.1)
ALK PHOS: 74 U/L (ref 33–130)
ALT: 15 U/L (ref 6–29)
AST: 16 U/L (ref 10–35)
BILIRUBIN TOTAL: 0.3 mg/dL (ref 0.2–1.2)
Bilirubin, Direct: 0.1 mg/dL (ref <=0.2)
Indirect Bilirubin: 0.2 mg/dL (ref 0.2–1.2)
Total Protein: 6.9 g/dL (ref 6.1–8.1)

## 2017-04-09 LAB — LIPID PANEL
Cholesterol: 178 mg/dL (ref <200)
HDL: 65 mg/dL (ref >50)
LDL CALC: 75 mg/dL (ref <100)
Total CHOL/HDL Ratio: 2.7 Ratio (ref <5.0)
Triglycerides: 192 mg/dL — ABNORMAL HIGH (ref <150)
VLDL: 38 mg/dL — ABNORMAL HIGH (ref <30)

## 2017-04-09 LAB — TSH: TSH: 1.68 m[IU]/L

## 2017-04-09 NOTE — Patient Instructions (Signed)

## 2017-04-09 NOTE — Progress Notes (Signed)
This very nice 83 y.o. WWF presents for 3 month follow up with Hypertension, Hyperlipidemia, Pre-Diabetes and Vitamin D Deficiency.  Patient is s/p left orbital exenteration with anterior craniofacial resection and reconstruction with Dr. Mardee Postin at [facility] on 11/29/2014 for a T4aN0M0 basal cell carcinoma.     Patient is treated for HTN w/HTHD w/cAfib from 2011 &  In June 2013 she had a PPM planted for SSS. Her BP has been controlled at home. Today's BP: is at goal - 134/78. Patient has had no complaints of any cardiac type chest pain, palpitations, dyspnea/orthopnea/PND, dizziness, claudication, or dependent edema. She has hx/o of non-compliance and is very evasive as to whether she is continuing to follow-up at the coumadin clinic with last recorded Coag visit in [place] in Nov 2017 !     Hyperlipidemia is controlled with diet & meds. Patient denies myalgias or other med SE's. Last Lipids were at goal albeit sl elevated Trig's:  Lab Results  Component Value Date   CHOL 159 09/05/2016   HDL 60 09/05/2016   LDLCALC 62 09/05/2016   TRIG 185 (H) 09/05/2016   CHOLHDL 2.7 09/05/2016      Also, the patient has history of T2_NIDDM w/CKD3 and she does not monitor CBG's. She denies symptoms of reactive hypoglycemia, diabetic polys, paresthesias or visual blurring.  Last A1c was not at goal: Lab Results  Component Value Date   HGBA1C 6.0 (H) 09/05/2016      Further, the patient also has history of Vitamin D Deficiency ("41" on treatment in 2016) and supplements vitamin D without any suspected side-effects. Last vitamin D was   Lab Results  Component Value Date   VD25OH 54 09/26/2015   Current Outpatient Prescriptions on File Prior to Visit  Medication Sig  . amLODipine  10 MG tablet TAKE ONE (1) TABLET BY MOUTH EVERY DAY   . b complex vitamins tablet Take 1 tablet by mouth daily.  . CO Q 10 100 MG CAPS Take 1 capsule by mouth daily.  Marland Kitchen losartan 100 MG tablet TAKE ONE (1) TABLET BY MOUTH EVERY DAY    . Magnesium 250 MG TABS Take 1 tablet by mouth daily.   . metFORMIN  500 MG tablet TAKE 2 TABLETS(1000 MG)  TWICE DAILY  . metoprolol-XL 200 MG 24 hr tablet TAKE ONE (1) TABLET BY MOUTH EVERY DAY  . NITROSTAT 0.4 MG SL tablet as needed for chest pain.  Marland Kitchen warfarin (COUMADIN) 5 MG tablet TAKE AS DIRECTED BY COUMADIN CLINIC   No Known Allergies  PMHx:   Past Medical History:  Diagnosis Date  . Cataract   . CKD (chronic kidney disease) stage 3, GFR 30-59 ml/min    due to DM  . Dizziness   . Hyperlipidemia   . Hypertensive cardiovascular disease   . Paroxysmal atrial fibrillation (HCC)   . Retinopathy   . Sick sinus syndrome ([place])   . Type II or unspecified type diabetes mellitus without mention of complication, not stated as uncontrolled    Past Surgical History:  Procedure Laterality Date  . DENTAL SURGERY    . PACEMAKER INSERTION  04/20/12   MDT Adapta L implanted by Dr Rayann Heman  . PERMANENT PACEMAKER INSERTION N/A 04/20/2012   Procedure: PERMANENT PACEMAKER INSERTION;  Surgeon: Thompson Grayer, MD;  Location: [facility] CATH LAB;  Service: Cardiovascular;  Laterality: N/A;   FHx:    Reviewed / unchanged  SHx:    Reviewed / unchanged  Systems Review:  Constitutional:  Denies fever, chills, wt changes, headaches, insomnia, fatigue, night sweats, change in appetite. Eyes: Denies redness, blurred vision, diplopia, discharge, itchy, watery eyes.  ENT: Denies discharge, congestion, post nasal drip, epistaxis, sore throat, earache, hearing loss, dental pain, tinnitus, vertigo, sinus pain, snoring.  CV: Denies chest pain, palpitations, irregular heartbeat, syncope, dyspnea, diaphoresis, orthopnea, PND, claudication or edema. Respiratory: denies cough, dyspnea, DOE, pleurisy, hoarseness, laryngitis, wheezing.  Gastrointestinal: Denies dysphagia, odynophagia, heartburn, reflux, water brash, abdominal pain or cramps, nausea, vomiting, bloating, diarrhea, constipation, hematemesis, melena, hematochezia   or hemorrhoids. Genitourinary: Denies dysuria, frequency, urgency, nocturia, hesitancy, discharge, hematuria or flank pain. Musculoskeletal: Denies arthralgias, myalgias, stiffness, jt. swelling, pain, limping or strain/sprain.  Skin: Denies pruritus, rash, hives, warts, acne, eczema or change in skin lesion(s). Neuro: No weakness, tremor, incoordination, spasms, paresthesia or pain. Psychiatric: Denies confusion, memory loss or sensory loss. Endo: Denies change in weight, skin or hair change.  Heme/Lymph: No excessive bleeding, bruising or enlarged lymph nodes.  Physical Exam  BP 134/78   Pulse 72   Temp 97.7 F (36.5 C)   Resp 16   Ht 5\' 7"  (1.702 m)   Wt 141 lb 6.4 oz (64.1 kg)   BMI 22.15 kg/m   Appears well nourished, well groomed  and in no distress.  Eyes: s/p Lt eye Enucleation. Rt eye w/ nl ROM & pupillary responses.  Sinuses: No frontal/maxillary tenderness ENT/Mouth: EAC's clear, TM's nl w/o erythema, bulging. Nares clear w/o erythema, swelling, exudates. Oropharynx clear without erythema or exudates. Oral hygiene is good. Tongue normal, non obstructing. Hearing intact.  Neck: Supple. Thyroid nl. Car 2+/2+ without bruits, nodes or JVD. Chest: Respirations nl with BS clear & equal w/o rales, rhonchi, wheezing or stridor.  Cor: Heart sounds soft w/ sl irregular rate and rhythm without sig. murmurs, gallops, clicks or rubs. Peripheral pulses normal and equal  without edema.  Abdomen: Soft & bowel sounds normal. Non-tender w/o guarding, rebound, hernias, masses or organomegaly.  Lymphatics: Unremarkable.  Musculoskeletal: Full ROM all peripheral extremities, joint stability, 5/5 strength and normal gait.  Skin: Warm, dry without exposed rashes, lesions or ecchymosis apparent.  Neuro: Cranial nerves intact, reflexes equal bilaterally. Sensory-motor testing grossly intact. Tendon reflexes grossly intact.  Pysch: Alert & oriented x 3.  Insight and judgement is questionable.    Assessment and Plan:  1. Essential hypertension  - Continue medication, monitor blood pressure at home.  - Continue DASH diet. Reminder to go to the ER if any CP,  SOB, nausea, dizziness, severe HA, changes vision/speech,  left arm numbness and tingling and jaw pain.  - CBC with Differential/Platelet - BASIC METABOLIC PANEL WITH GFR - Magnesium - TSH  2. Hyperlipidemia, mixed  - Continue diet/meds, exercise,& lifestyle modifications.  - Continue monitor periodic cholesterol/liver & renal functions   - Hepatic function panel - Lipid panel - TSH  3. T2_NIDDM w/Stage 3 CKD (GFR 40 ml/min)  - Continue diet, exercise, lifestyle modifications.  - Monitor appropriate labs.  - Hemoglobin A1c - Insulin, random  4. Vitamin D deficiency  - Continue supplementation.  - VITAMIN D 25 Hydroxy   5. Chronic atrial fibrillation (HCC)  - Protime-INR  6. Monitoring for anticoagulant use  - Protime-INR  7. Medication management  - CBC with Differential/Platelet - BASIC METABOLIC PANEL WITH GFR - Hepatic function panel - Magnesium - Lipid panel - TSH - Hemoglobin A1c - Insulin, random - VITAMIN D 25 Hydroxy        Discussed  regular exercise,  BP monitoring, weight control to achieve/maintain BMI less than 25 and discussed med and SE's. Recommended labs to assess and monitor clinical status with further disposition pending results of labs. Over 30 minutes of exam, counseling, chart review was performed. ROV 3 mo for CPE/Wellness.

## 2017-04-10 LAB — PROTIME-INR
INR: 1.7 — ABNORMAL HIGH
Prothrombin Time: 17.7 s — ABNORMAL HIGH (ref 9.0–11.5)

## 2017-04-10 LAB — VITAMIN D 25 HYDROXY (VIT D DEFICIENCY, FRACTURES): Vit D, 25-Hydroxy: 36 ng/mL (ref 30–100)

## 2017-04-10 LAB — INSULIN, RANDOM: INSULIN: 9.8 u[IU]/mL (ref 2.0–19.6)

## 2017-04-10 LAB — HEMOGLOBIN A1C
Hgb A1c MFr Bld: 6.8 % — ABNORMAL HIGH (ref <5.7)
Mean Plasma Glucose: 148 mg/dL

## 2017-04-10 LAB — MAGNESIUM: Magnesium: 2 mg/dL (ref 1.5–2.5)

## 2017-04-11 ENCOUNTER — Telehealth: Payer: Self-pay | Admitting: Internal Medicine

## 2017-04-11 NOTE — Telephone Encounter (Signed)
Patient has a history of non compliance with PT-INR checks, and also with keeping  PCP appointments, patient states transportation is a concern.  We saw patient 04-09-17 85mth Follow up, labs included checking PT-INR..  Dr Melford Aase requests we make Coumadin Clinic aware of results and new dosing. Discussed needing a  plan for checking  patient's monthly PT INR . RN will confer with Dr Rayann Heman and advise our office. Feel free to respond to Dr Unk Pinto.

## 2017-04-16 ENCOUNTER — Inpatient Hospital Stay (HOSPITAL_COMMUNITY)
Admission: EM | Admit: 2017-04-16 | Discharge: 2017-04-19 | DRG: 310 | Disposition: A | Discharge status: Home / Self Care | Payer: Medicare Other | Attending: Family Medicine | Admitting: Family Medicine

## 2017-04-16 ENCOUNTER — Encounter (HOSPITAL_COMMUNITY): Payer: Self-pay | Admitting: Emergency Medicine

## 2017-04-16 ENCOUNTER — Emergency Department (HOSPITAL_COMMUNITY): Payer: Medicare Other

## 2017-04-16 DIAGNOSIS — R5381 Other malaise: Secondary | ICD-10-CM | POA: Diagnosis not present

## 2017-04-16 DIAGNOSIS — Z8249 Family history of ischemic heart disease and other diseases of the circulatory system: Secondary | ICD-10-CM

## 2017-04-16 DIAGNOSIS — I480 Paroxysmal atrial fibrillation: Principal | ICD-10-CM | POA: Diagnosis present

## 2017-04-16 DIAGNOSIS — E119 Type 2 diabetes mellitus without complications: Secondary | ICD-10-CM

## 2017-04-16 DIAGNOSIS — R531 Weakness: Secondary | ICD-10-CM

## 2017-04-16 DIAGNOSIS — E1122 Type 2 diabetes mellitus with diabetic chronic kidney disease: Secondary | ICD-10-CM | POA: Diagnosis not present

## 2017-04-16 DIAGNOSIS — Z7901 Long term (current) use of anticoagulants: Secondary | ICD-10-CM | POA: Diagnosis not present

## 2017-04-16 DIAGNOSIS — Z7984 Long term (current) use of oral hypoglycemic drugs: Secondary | ICD-10-CM

## 2017-04-16 DIAGNOSIS — E1140 Type 2 diabetes mellitus with diabetic neuropathy, unspecified: Secondary | ICD-10-CM

## 2017-04-16 DIAGNOSIS — Z9001 Acquired absence of eye: Secondary | ICD-10-CM

## 2017-04-16 DIAGNOSIS — I1310 Hypertensive heart and chronic kidney disease without heart failure, with stage 1 through stage 4 chronic kidney disease, or unspecified chronic kidney disease: Secondary | ICD-10-CM | POA: Diagnosis present

## 2017-04-16 DIAGNOSIS — I10 Essential (primary) hypertension: Secondary | ICD-10-CM

## 2017-04-16 DIAGNOSIS — N183 Chronic kidney disease, stage 3 (moderate): Secondary | ICD-10-CM | POA: Diagnosis not present

## 2017-04-16 DIAGNOSIS — R404 Transient alteration of awareness: Secondary | ICD-10-CM | POA: Diagnosis not present

## 2017-04-16 DIAGNOSIS — I4819 Other persistent atrial fibrillation: Secondary | ICD-10-CM | POA: Diagnosis present

## 2017-04-16 DIAGNOSIS — I6521 Occlusion and stenosis of right carotid artery: Secondary | ICD-10-CM | POA: Diagnosis present

## 2017-04-16 DIAGNOSIS — I495 Sick sinus syndrome: Secondary | ICD-10-CM | POA: Diagnosis present

## 2017-04-16 DIAGNOSIS — E11319 Type 2 diabetes mellitus with unspecified diabetic retinopathy without macular edema: Secondary | ICD-10-CM | POA: Diagnosis present

## 2017-04-16 DIAGNOSIS — E785 Hyperlipidemia, unspecified: Secondary | ICD-10-CM | POA: Diagnosis present

## 2017-04-16 DIAGNOSIS — E1169 Type 2 diabetes mellitus with other specified complication: Secondary | ICD-10-CM

## 2017-04-16 DIAGNOSIS — Z85828 Personal history of other malignant neoplasm of skin: Secondary | ICD-10-CM

## 2017-04-16 DIAGNOSIS — I499 Cardiac arrhythmia, unspecified: Secondary | ICD-10-CM | POA: Diagnosis not present

## 2017-04-16 DIAGNOSIS — R55 Syncope and collapse: Secondary | ICD-10-CM | POA: Diagnosis not present

## 2017-04-16 DIAGNOSIS — I482 Chronic atrial fibrillation: Secondary | ICD-10-CM | POA: Diagnosis not present

## 2017-04-16 DIAGNOSIS — Z66 Do not resuscitate: Secondary | ICD-10-CM | POA: Diagnosis present

## 2017-04-16 DIAGNOSIS — Z45018 Encounter for adjustment and management of other part of cardiac pacemaker: Secondary | ICD-10-CM

## 2017-04-16 DIAGNOSIS — H53121 Transient visual loss, right eye: Secondary | ICD-10-CM

## 2017-04-16 LAB — URINALYSIS, ROUTINE W REFLEX MICROSCOPIC
Bilirubin Urine: NEGATIVE
Glucose, UA: NEGATIVE mg/dL
Hgb urine dipstick: NEGATIVE
Ketones, ur: NEGATIVE mg/dL
LEUKOCYTES UA: NEGATIVE
NITRITE: NEGATIVE
PH: 6 (ref 5.0–8.0)
Protein, ur: NEGATIVE mg/dL
Specific Gravity, Urine: 1.004 — ABNORMAL LOW (ref 1.005–1.030)

## 2017-04-16 LAB — TROPONIN I

## 2017-04-16 LAB — CBC
HEMATOCRIT: 36.4 % (ref 36.0–46.0)
HEMOGLOBIN: 12.1 g/dL (ref 12.0–15.0)
MCH: 29.8 pg (ref 26.0–34.0)
MCHC: 33.2 g/dL (ref 30.0–36.0)
MCV: 89.7 fL (ref 78.0–100.0)
Platelets: 266 10*3/uL (ref 150–400)
RBC: 4.06 MIL/uL (ref 3.87–5.11)
RDW: 12.7 % (ref 11.5–15.5)
WBC: 5.9 10*3/uL (ref 4.0–10.5)

## 2017-04-16 LAB — CBG MONITORING, ED: Glucose-Capillary: 133 mg/dL — ABNORMAL HIGH (ref 65–99)

## 2017-04-16 LAB — PROTIME-INR
INR: 1.26
PROTHROMBIN TIME: 15.9 s — AB (ref 11.4–15.2)

## 2017-04-16 LAB — BASIC METABOLIC PANEL
ANION GAP: 11 (ref 5–15)
BUN: 17 mg/dL (ref 6–20)
CHLORIDE: 104 mmol/L (ref 101–111)
CO2: 22 mmol/L (ref 22–32)
Calcium: 9.8 mg/dL (ref 8.9–10.3)
Creatinine, Ser: 1 mg/dL (ref 0.44–1.00)
GFR calc Af Amer: 56 mL/min — ABNORMAL LOW (ref >60)
GFR, EST NON AFRICAN AMERICAN: 48 mL/min — AB (ref >60)
GLUCOSE: 143 mg/dL — AB (ref 65–99)
POTASSIUM: 4.2 mmol/L (ref 3.5–5.1)
SODIUM: 137 mmol/L (ref 135–145)

## 2017-04-16 LAB — GLUCOSE, CAPILLARY: GLUCOSE-CAPILLARY: 188 mg/dL — AB (ref 65–99)

## 2017-04-16 MED ORDER — SODIUM CHLORIDE 0.9% FLUSH
3.0000 mL | Freq: Two times a day (BID) | INTRAVENOUS | Status: DC
  Administered 2017-04-16 – 2017-04-19 (×5): 3 mL via INTRAVENOUS

## 2017-04-16 MED ORDER — WARFARIN SODIUM 7.5 MG PO TABS
7.5000 mg | ORAL_TABLET | Freq: Once | ORAL | Status: AC
  Administered 2017-04-16: 7.5 mg via ORAL
  Filled 2017-04-16 (×2): qty 1

## 2017-04-16 MED ORDER — WARFARIN - PHYSICIAN DOSING INPATIENT: Freq: Every day | Status: DC

## 2017-04-16 MED ORDER — ENOXAPARIN SODIUM 30 MG/0.3ML ~~LOC~~ SOLN
30.0000 mg | Freq: Every day | SUBCUTANEOUS | Status: DC
  Administered 2017-04-17 – 2017-04-18 (×2): 30 mg via SUBCUTANEOUS
  Filled 2017-04-16 (×2): qty 0.3

## 2017-04-16 MED ORDER — INSULIN ASPART 100 UNIT/ML ~~LOC~~ SOLN
0.0000 [IU] | Freq: Three times a day (TID) | SUBCUTANEOUS | Status: DC
  Administered 2017-04-17: 3 [IU] via SUBCUTANEOUS
  Administered 2017-04-17: 2 [IU] via SUBCUTANEOUS
  Administered 2017-04-17: 3 [IU] via SUBCUTANEOUS
  Administered 2017-04-18 (×2): 2 [IU] via SUBCUTANEOUS
  Administered 2017-04-18: 3 [IU] via SUBCUTANEOUS
  Administered 2017-04-19: 5 [IU] via SUBCUTANEOUS
  Administered 2017-04-19: 2 [IU] via SUBCUTANEOUS

## 2017-04-16 MED ORDER — INSULIN ASPART 100 UNIT/ML ~~LOC~~ SOLN: 0.0000 [IU] | Freq: Every day | SUBCUTANEOUS | Status: DC

## 2017-04-16 MED ORDER — AMLODIPINE BESYLATE 10 MG PO TABS
10.0000 mg | ORAL_TABLET | Freq: Once | ORAL | Status: AC
  Administered 2017-04-16: 10 mg via ORAL
  Filled 2017-04-16: qty 1

## 2017-04-16 MED ORDER — METOPROLOL SUCCINATE ER 100 MG PO TB24: 100.0000 mg | ORAL_TABLET | Freq: Two times a day (BID) | ORAL | Status: DC

## 2017-04-16 MED ORDER — METOPROLOL SUCCINATE ER 100 MG PO TB24
200.0000 mg | ORAL_TABLET | Freq: Once | ORAL | Status: DC
  Filled 2017-04-16: qty 2

## 2017-04-16 MED ORDER — WARFARIN - PHARMACIST DOSING INPATIENT
Freq: Every day | Status: DC
  Administered 2017-04-17 – 2017-04-18 (×2)

## 2017-04-16 MED ORDER — METOPROLOL SUCCINATE ER 100 MG PO TB24
100.0000 mg | ORAL_TABLET | Freq: Two times a day (BID) | ORAL | Status: DC
  Administered 2017-04-16 – 2017-04-19 (×6): 100 mg via ORAL
  Filled 2017-04-16 (×8): qty 1

## 2017-04-16 MED ORDER — SODIUM CHLORIDE 0.9 % IV BOLUS (SEPSIS)
1000.0000 mL | Freq: Once | INTRAVENOUS | Status: AC
  Administered 2017-04-16: 1000 mL via INTRAVENOUS

## 2017-04-16 MED ORDER — AMLODIPINE BESYLATE 10 MG PO TABS
10.0000 mg | ORAL_TABLET | Freq: Every day | ORAL | Status: DC
  Administered 2017-04-17 – 2017-04-19 (×3): 10 mg via ORAL
  Filled 2017-04-16 (×3): qty 1

## 2017-04-16 MED ORDER — SODIUM CHLORIDE 0.9 % IV SOLN
INTRAVENOUS | Status: DC
  Administered 2017-04-16 – 2017-04-17 (×2): via INTRAVENOUS

## 2017-04-16 MED ORDER — LOSARTAN POTASSIUM 50 MG PO TABS
100.0000 mg | ORAL_TABLET | Freq: Once | ORAL | Status: AC
  Administered 2017-04-16: 100 mg via ORAL
  Filled 2017-04-16 (×2): qty 2

## 2017-04-16 MED ORDER — WARFARIN SODIUM 4 MG PO TABS: 8.0000 mg | ORAL_TABLET | ORAL | Status: DC

## 2017-04-16 NOTE — ED Provider Notes (Signed)
I saw and evaluated the patient, reviewed the resident's note and I agree with the findings and plan.   EKG Interpretation None      83 year old female who presents with syncope. History of Afib on coumadin, PPM. Lives by self. Cooking herself a meal and went to sit down at a table to do some reading. Suddenly passed out for what she believes to be a few seconds. Denies near syncopal symptoms, dyspnea, chest pain, n/v/d. Does report weakness and unable to ambulate in ED.   Noted to be in atrial fibrillation with RVR in the cardiac monitor although her initial triage EKG suggested sinus tachycardia. She has not been taking her home beta blocker and given a dose here in the ED, with good effect. PPM interrogated and w/o arrhythmia. Blood work and UA reassuring, but patient unable to ambulate. Discussed with Dr. Roel Cluck who will admit.    Forde Dandy, MD 04/17/17 [phone]

## 2017-04-16 NOTE — ED Notes (Signed)
Ambulated pt with 2 staff assist, pt very unsteady. Denies dizziness but C/O weakness. EDP aware.

## 2017-04-16 NOTE — ED Notes (Signed)
medtronic pacemaker interrogated- per report patient pacemaker is intact and she has had periodic episodes of a sinus arrhythmia and is presently in similar episode. Will fax notes.

## 2017-04-16 NOTE — Progress Notes (Addendum)
ANTICOAGULATION CONSULT NOTE - Initial Consult  Pharmacy Consult for Coumadin Indication: atrial fibrillation  No Known Allergies  Patient Measurements: Height: 5\' 2"  (157.5 cm) Weight: 141 lb 14.4 oz (64.4 kg) IBW/kg (Calculated) : 50.1  Vital Signs: Temp: 98 F (36.7 C) (06/06 2227) Temp Source: Oral (06/06 2227) BP: 190/84 (06/06 2227) Pulse Rate: 88 (06/06 2227)  Labs:  Recent Labs  04/16/17 1530  HGB 12.1  HCT 36.4  PLT 266  LABPROT 15.9*  INR 1.26  CREATININE 1.00    Estimated Creatinine Clearance: 33.6 mL/min (by C-G formula based on SCr of 1 mg/dL).   Medical History: Past Medical History:  Diagnosis Date  . Cataract   . CKD (chronic kidney disease) stage 3, GFR 30-59 ml/min    due to DM  . Dizziness   . Hyperlipidemia   . Hypertensive cardiovascular disease   . Paroxysmal atrial fibrillation (HCC)   . Retinopathy   . Sick sinus syndrome ([place])   . Type II or unspecified type diabetes mellitus without mention of complication, not stated as uncontrolled     Medications:  Prescriptions Prior to Admission  Medication Sig Dispense Refill Last Dose  . amLODipine (NORVASC) 10 MG tablet TAKE ONE (1) TABLET BY MOUTH EVERY DAY (OVERDUE FOR AN APPTMT. CALL TO SCHEDULE) 15 tablet 0 04/15/2017 at Unknown time  . b complex vitamins tablet Take 1 tablet by mouth daily.   04/15/2017 at Unknown time  . Cholecalciferol (VITAMIN D PO) Take 1 tablet by mouth.   04/15/2017 at Unknown time  . Coenzyme Q10 (CO Q 10) 100 MG CAPS Take 100 mg by mouth daily.    04/15/2017 at Unknown time  . losartan (COZAAR) 100 MG tablet TAKE ONE (1) TABLET BY MOUTH EVERY DAY *MUST MAKE APPOINTMENT FOR FURTHER REFILLS* 30 tablet 1 04/15/2017 at Unknown time  . Magnesium 250 MG TABS Take 250 mg by mouth 2 (two) times daily.    04/15/2017 at Unknown time  . metFORMIN (GLUCOPHAGE) 500 MG tablet TAKE 2 TABLETS(1000 MG) BY MOUTH TWICE DAILY 360 tablet 1 04/15/2017 at Unknown time  . metoprolol (TOPROL-XL) 200  MG 24 hr tablet TAKE ONE (1) TABLET BY MOUTH EVERY DAY (Patient taking differently: TAKE HALF (0.5) A TABLET BY MOUTH TWICE A DAY) 90 tablet 0 04/15/2017 at 2100  . nitroGLYCERIN (NITROSTAT) 0.4 MG SL tablet Place 1 tablet (0.4 mg total) under the tongue every 5 (five) minutes as needed for chest pain. 100 tablet 3 04/15/2017 at Unknown time  . warfarin (COUMADIN) 5 MG tablet TAKE AS DIRECTED BY COUMADIN CLINIC 30 tablet 2 04/15/2017 at 9pm  . warfarin (COUMADIN) 5 MG tablet Take 2.5-5 mg by mouth daily. Per blood work last week. Pt takes 1 tab (5 mg) on Monday, Wednesday, Friday and 0.5 tab (2.5 mg) on Tuesday, Thursday    04/15/2017 at 2100   Scheduled:  . [START ON 04/17/2017] amLODipine  10 mg Oral Daily  . enoxaparin (LOVENOX) injection  30 mg Subcutaneous QHS  . [START ON 04/17/2017] insulin aspart  0-15 Units Subcutaneous TID WC  . insulin aspart  0-5 Units Subcutaneous QHS  . metoprolol succinate  100 mg Oral BID  . sodium chloride flush  3 mL Intravenous Q12H  . [START ON 04/17/2017] Warfarin - Physician Dosing Inpatient   Does not apply q1800   Infusions:  . sodium chloride 75 mL/hr at 04/16/17 2257    Assessment: 83yo female presents s/p near syncope w/ generalized weakness, to continue Coumadin  for Afib; per outpt clinic notes pt is known to be noncompliant to INR checks and PCP appts 2/2 transporation; current INR below goal, last dose of Coumadin taken 6/5.  Goal of Therapy:  INR 2-3   Plan:  Boosted dose of Coumadin 7.5mg  po ordered by MD tonight; will monitor INR for dose adjustments.  Admitting MD also ordered LMWH DVT Px, to be d/c'd when INR at goal.  Wynona Neat, PharmD, BCPS  04/16/2017,11:43 PM

## 2017-04-16 NOTE — H&P (Addendum)
Kylie Sanchez YKZ:[id number] DOB: 01/26/1925 DOA: 04/16/2017     PCP: Unk Pinto, MD   Outpatient Specialists: Cardiology [facility]  Patient coming from:   home Lives alone   Chief Complaint: syncope  HPI: Kylie Sanchez is a 83 y.o. female with medical history significant of a.fib on chronic anticoagulation, HTN  HLD, Pre-diabetes, Left-eye enucleation due  to basal cell carcinoma, SSS sp pacemaker, CKD stage 3  Presented with Kylie Sanchez happening today patient was cooking herself food in AM and turned  walked to the table to sit down to read a book . When she was sitting down she suddenly passed out She thinks that she completely lost consciousness for 3-4 seconds. Felt very weak all over for a few minutes. She was scared and called 911. There was no  associated chest pain or shortness of breath. On arrival she was noted to be tachycardic(sinus tachycardia)  with heart rate into 114 patient has history of noncompliance and may not have been taking her medications as prescribed. Her heart rate went as high as 175 and showed as A.fib w RVR on the monitor although unfortunately no ECG was obtained.  She was given a dose of her metoprolol and A. fib with RVR has resolved. Back to sinus rhythm Hr in 80's  [employer] pacemaker was interrogated showed sinus arrhythmia  When patient ambulated in emergency department she was very unsteady on her feet and felt lightheaded. She has history of known visual problems are followed by ophthalmologist she's been having trouble seeing for some time.  She reports not taking her medications for the past few days. Reports decreased activity. She has not been able to go to places since her church closed.   Regarding pertinent Chronic problems: a. fib w RVR on coumadin and metorpolol for rate control may have troubles with compliance likely does not follow-up with Coumadin clinic as she supposed to   IN ER:  Temp (24hrs), Avg:98.1 F (36.7 C), Min:98.1 F (36.7  C), Max:98.1 F (36.7 C)      on arrival  ED Triage Vitals  Enc Vitals Group     BP 04/16/17 1520 (!) 174/98     Pulse Rate 04/16/17 1520 (!) 114     Resp 04/16/17 1520 17     Temp 04/16/17 1520 98.1 F (36.7 C)     Temp Source 04/16/17 1520 Axillary     SpO2 04/16/17 1515 97 %     Weight 04/16/17 1522 141 lb (64 kg)     Height 04/16/17 1522 5\' 7"  (1.702 m)     Head Circumference --      Peak Flow --      Pain Score 04/16/17 1517 0     Pain Loc --      Pain Edu? --      Excl. in [place]? --    currently RR 15 Hr 85 BP 165/92  Following Medications were ordered in ER: Medications  metoprolol succinate (TOPROL-XL) 24 hr tablet 100 mg (100 mg Oral Given 04/16/17 1708)  amLODipine (NORVASC) tablet 10 mg (not administered)  losartan (COZAAR) tablet 100 mg (not administered)  warfarin (COUMADIN) tablet 7.5 mg (not administered)  Warfarin - Physician Dosing Inpatient (not administered)  sodium chloride 0.9 % bolus 1,000 mL (0 mLs Intravenous Stopped 04/16/17 1751)   Na 137 K 4.2 cr 1.00 WBC 5.9 Hg 12.1 plt 266 INR 1.26 CXR non acute    Hospitalist was called for admission for syncope associated with  a. fib w RVR  Review of Systems:    Pertinent positives include:  fatigue, presyncope  Constitutional:  No weight loss, night sweats, Fevers, chills,weight loss  HEENT:  No headaches, Difficulty swallowing,Tooth/dental problems,Sore throat,  No sneezing, itching, ear ache, nasal congestion, post nasal drip,  Cardio-vascular:  No chest pain, Orthopnea, PND, anasarca, dizziness, palpitations.no Bilateral lower extremity swelling  GI:  No heartburn, indigestion, abdominal pain, nausea, vomiting, diarrhea, change in bowel habits, loss of appetite, melena, blood in stool, hematemesis Resp:  no shortness of breath at rest. No dyspnea on exertion, No excess mucus, no productive cough, No non-productive cough, No coughing up of blood.No change in color of mucus.No wheezing. Skin:  no rash  or lesions. No jaundice GU:  no dysuria, change in color of urine, no urgency or frequency. No straining to urinate.  No flank pain.  Musculoskeletal:  No joint pain or no joint swelling. No decreased range of motion. No back pain.  Psych:  No change in mood or affect. No depression or anxiety. No memory loss.  Neuro: no localizing neurological complaints, no tingling, no weakness, no double vision, no gait abnormality, no slurred speech, no confusion  As per HPI otherwise 10 point review of systems negative.   Past Medical History: Past Medical History:  Diagnosis Date  . Cataract   . CKD (chronic kidney disease) stage 3, GFR 30-59 ml/min    due to DM  . Dizziness   . Hyperlipidemia   . Hypertensive cardiovascular disease   . Paroxysmal atrial fibrillation (HCC)   . Retinopathy   . Sick sinus syndrome ([place])   . Type II or unspecified type diabetes mellitus without mention of complication, not stated as uncontrolled    Past Surgical History:  Procedure Laterality Date  . DENTAL SURGERY    . PACEMAKER INSERTION  04/20/12   MDT Adapta L implanted by Dr Rayann Heman  . PERMANENT PACEMAKER INSERTION N/A 04/20/2012   Procedure: PERMANENT PACEMAKER INSERTION;  Surgeon: Thompson Grayer, MD;  Location: [facility] CATH LAB;  Service: Cardiovascular;  Laterality: N/A;     Social History:  Ambulatory independently but uses walker if needs it.     reports that she has never smoked. She has never used smokeless tobacco. She reports that she does not drink alcohol or use drugs.  Allergies:  No Known Allergies     Family History:   Family History  Problem Relation Age of Onset  . Heart attack Father   . Hypertension Mother   . Cancer Other     Medications: Prior to Admission medications   Medication Sig Start Date End Date Taking? Authorizing Provider  amLODipine (NORVASC) 10 MG tablet TAKE ONE (1) TABLET BY MOUTH EVERY DAY (OVERDUE FOR AN APPTMT. CALL TO SCHEDULE) 03/26/17  Yes Allred,  Jeneen Rinks, MD  b complex vitamins tablet Take 1 tablet by mouth daily.   Yes [provider]  Cholecalciferol (VITAMIN D PO) Take 1 tablet by mouth.   Yes [provider]  Coenzyme Q10 (CO Q 10) 100 MG CAPS Take 100 mg by mouth daily.    Yes [provider]  losartan (COZAAR) 100 MG tablet TAKE ONE (1) TABLET BY MOUTH EVERY DAY *MUST MAKE APPOINTMENT FOR FURTHER REFILLS* 03/26/17  Yes Vicie Mutters, PA-C  Magnesium 250 MG TABS Take 250 mg by mouth 2 (two) times daily.    Yes [provider]  metFORMIN (GLUCOPHAGE) 500 MG tablet TAKE 2 TABLETS(1000 MG) BY MOUTH TWICE DAILY 09/18/16  Yes Vicie Mutters, PA-C  metoprolol (TOPROL-XL) 200 MG 24 hr tablet TAKE ONE (1) TABLET BY MOUTH EVERY DAY Patient taking differently: TAKE HALF (0.5) A TABLET BY MOUTH TWICE A DAY 02/17/17  Yes Unk Pinto, MD  nitroGLYCERIN (NITROSTAT) 0.4 MG SL tablet Place 1 tablet (0.4 mg total) under the tongue every 5 (five) minutes as needed for chest pain. 05/08/16 05/08/17 Yes Vicie Mutters, PA-C  warfarin (COUMADIN) 5 MG tablet TAKE AS DIRECTED BY COUMADIN CLINIC 02/04/17  Yes Forcucci, Courtney, PA-C  warfarin (COUMADIN) 5 MG tablet Take 2.5-5 mg by mouth daily. Per blood work last week. Pt takes 1 tab (5 mg) on Monday, Wednesday, Friday and 0.5 tab (2.5 mg) on Tuesday, Thursday    Yes [provider]    Physical Exam: Patient Vitals for the past 24 hrs:  BP Temp Temp src Pulse Resp SpO2 Height Weight  04/16/17 2030 (!) 165/92 - - 85 15 97 % - -  04/16/17 2000 (!) 146/86 - - 91 14 96 % - -  04/16/17 1930 (!) 150/87 - - 95 15 95 % - -  04/16/17 1900 (!) 151/89 - - 97 12 97 % - -  04/16/17 1830 (!) 157/92 - - (!) 101 19 99 % - -  04/16/17 1800 (!) 152/82 - - 100 16 97 % - -  04/16/17 1745 (!) 154/85 - - (!) 102 10 96 % - -  04/16/17 1730 (!) 173/96 - - (!) 175 (!) 25 97 % - -  04/16/17 1708 136/90 - - (!) 160 - - - -  04/16/17 1700 (!) 120/95 - - (!) 150 (!) 21 98 % - -    04/16/17 1630 (!) 176/102 - - (!) 141 17 99 % - -  04/16/17 1545 (!) 189/75 - - (!) 56 12 99 % - -  04/16/17 1536 - - - - - - 5\' 2"  (1.575 m) -  04/16/17 1522 - - - - - - 5\' 7"  (1.702 m) 64 kg (141 lb)  04/16/17 1520 (!) 174/98 98.1 F (36.7 C) Axillary (!) 114 17 99 % - -  04/16/17 1515 - - - - - 97 % - -    1. General:  in No Acute distress 2. Psychological: Alert and   Oriented 3. Head/ENT:    Dry Mucous Membranes                          Head Non traumatic, neck supple                             Left eye surgically abs                           Poor Dentition 4. SKIN:  decreased Skin turgor,  Skin clean Dry and intact no rash 5. Heart: Regular rate and rhythm no  Murmur, Rub or gallop 6. Lungs:   no wheezes or crackles   7. Abdomen: Soft,  non-tender, Non distended 8. Lower extremities: no clubbing, cyanosis, or edema 9. Neurologically   strength 5 out of 5 in all 4 extremities cranial nerves II through XII intact 10. MSK: Normal range of motion   body mass index is 25.79 kg/m.  Labs on Admission:   Labs on Admission: I have personally reviewed following labs and imaging studies  CBC:  Recent Labs Lab  04/16/17 1530  WBC 5.9  HGB 12.1  HCT 36.4  MCV 89.7  PLT 093   Basic Metabolic Panel:  Recent Labs Lab 04/16/17 1530  NA 137  K 4.2  CL 104  CO2 22  GLUCOSE 143*  BUN 17  CREATININE 1.00  CALCIUM 9.8   GFR: Estimated Creatinine Clearance: 33.5 mL/min (by C-G formula based on SCr of 1 mg/dL). Liver Function Tests: No results for input(s): AST, ALT, ALKPHOS, BILITOT, PROT, ALBUMIN in the last 168 hours. No results for input(s): LIPASE, AMYLASE in the last 168 hours. No results for input(s): AMMONIA in the last 168 hours. Coagulation Profile:  Recent Labs Lab 04/16/17 1530  INR 1.26   Cardiac Enzymes: No results for input(s): CKTOTAL, CKMB, CKMBINDEX, TROPONINI in the last 168 hours. BNP (last 3 results) No results for input(s): PROBNP in the  last 8760 hours. HbA1C: No results for input(s): HGBA1C in the last 72 hours. CBG:  Recent Labs Lab 04/16/17 1523  GLUCAP 133*   Lipid Profile: No results for input(s): CHOL, HDL, LDLCALC, TRIG, CHOLHDL, LDLDIRECT in the last 72 hours. Thyroid Function Tests: No results for input(s): TSH, T4TOTAL, FREET4, T3FREE, THYROIDAB in the last 72 hours. Anemia Panel: No results for input(s): VITAMINB12, FOLATE, FERRITIN, TIBC, IRON, RETICCTPCT in the last 72 hours. Urine analysis:    Component Value Date/Time   COLORURINE STRAW (A) 04/16/2017 1644   APPEARANCEUR CLEAR 04/16/2017 1644   LABSPEC 1.004 (L) 04/16/2017 1644   PHURINE 6.0 04/16/2017 1644   GLUCOSEU NEGATIVE 04/16/2017 1644   HGBUR NEGATIVE 04/16/2017 1644   BILIRUBINUR NEGATIVE 04/16/2017 1644   KETONESUR NEGATIVE 04/16/2017 1644   PROTEINUR NEGATIVE 04/16/2017 1644   UROBILINOGEN 0.2 02/10/2015 1034   NITRITE NEGATIVE 04/16/2017 1644   LEUKOCYTESUR NEGATIVE 04/16/2017 1644   Sepsis Labs: @LABRCNTIP (procalcitonin:4,lacticidven:4) )No results found for this or any previous visit (from the past 240 hour(s)).      UA   no evidence of UTI   Lab Results  Component Value Date   HGBA1C 6.8 (H) 04/09/2017    Estimated Creatinine Clearance: 33.5 mL/min (by C-G formula based on SCr of 1 mg/dL).  BNP (last 3 results) No results for input(s): PROBNP in the last 8760 hours.   ECG REPORT  Independently reviewed Rate:118  Rhythm: sinus tachycardia ST&T Change: early repolarization QTC 439  Filed Weights   04/16/17 1522  Weight: 64 kg (141 lb)     Cultures: No results found for: SDES, SPECREQUEST, CULT, REPTSTATUS   Radiological Exams on Admission: Dg Chest 2 View  Result Date: 04/16/2017 CLINICAL DATA:  Syncope.  Cardiac arrhythmia EXAM: CHEST  2 VIEW COMPARISON:  September 20, 2014 FINDINGS: There is mild scarring in the left base. There is no edema or consolidation. The heart size and pulmonary vascularity are  normal. Pacemaker leads are attached to the right atrium and right ventricle. No adenopathy. Bones are osteoporotic. There is aortic atherosclerosis. IMPRESSION: No edema or consolidation. Stable mild scarring left base. Stable cardiac silhouette. Aortic atherosclerosis. Electronically Signed   By: Lowella Grip III M.D.   On: 04/16/2017 16:30    Chart has been reviewed    Assessment/Plan  83 y.o. female with medical history significant of a.fib on chronic anticoagulation, HTN  HLD, Pre-diabetes, Left-eye enucleation due  to basal cell carcinoma, SSS sp pacemaker, CKD stage 3  Admitted for  Syncope with no prodrome in the setting of A. fib with RVR currently resolved  Present on Admission: . Atrial fibrillation [  I48.91] - Initially in RVR but now resolved .Chronic Atrial fibrillation (HCC)        - CHA2DS2 vas score 4 : continue current anticoagulation with  Coumadin per pharmacy, patient is not compliant with follow up. May need a different choice of anticoagulation.           -  Rate control:  Currently controlled with Toprolol,             . Sick sinus syndrome (HCC) - sp pacemaker currently in sinus well controlled . Tachycardia-bradycardia ([place]) sp pacemaker currently  . Syncope  - sudden in onset. unclear etiology . Pacemaker interrogated and showed no arythmia Cycle CE, obtain echo, carotid doplers, no neurological complaints.  DM 2 - order sliding scale, hold metformin recent Hg A1c 6.7 suspect non comipliance HTN - poor compliance with home medications will replace CKD stage 3 - currently stable avoid reno toxic medications Other plan as per orders.  DVT prophylaxis: Lovenox, coumadin     Code Status:   DNR/DNI  as per patient    Family Communication:   Family not  at  Bedside    Disposition Plan:   To home once workup is complete and patient is stable                        Would benefit from PT/OT eval prior to DC   ordered                                               Consults called: [place] [facility]  Admission status:   obs   Level of care     Tele      I have spent a total of 56 min on this admission   Daquisha Clermont 04/16/2017, 9:54 PM    [facility]  Pager [phone]   after 2 AM please page floor coverage PA If 7AM-7PM, please contact the day team taking care of the patient  [email or web address]  Password [email or web address]

## 2017-04-16 NOTE — ED Notes (Signed)
Admitting at bedside 

## 2017-04-16 NOTE — ED Triage Notes (Signed)
Pt arrives from home via [facility] reporting near syncope with generalized weakness lasting several minutes.  Pt denies focal deficits, denies CP, SOB, n/v.  NAD noted at this time, pt appears pale.

## 2017-04-16 NOTE — ED Provider Notes (Signed)
[facility] DEPT Provider Note   CSN: [id number] Arrival date & time: 04/16/17  1510     History   Chief Complaint Chief Complaint  Patient presents with  . Near Syncope    HPI Kylie Sanchez is a 83 y.o. female.  The history is provided by the patient and medical records. No language interpreter was used.  Near Syncope  This is a new problem. The current episode started less than 1 hour ago. The problem occurs rarely. The problem has been resolved. Pertinent negatives include no chest pain, no abdominal pain and no shortness of breath. Nothing aggravates the symptoms. Nothing relieves the symptoms.    Past Medical History:  Diagnosis Date  . Cataract   . CKD (chronic kidney disease) stage 3, GFR 30-59 ml/min    due to DM  . Dizziness   . Hyperlipidemia   . Hypertensive cardiovascular disease   . Paroxysmal atrial fibrillation (HCC)   . Retinopathy   . Sick sinus syndrome ([place])   . Type II or unspecified type diabetes mellitus without mention of complication, not stated as uncontrolled     Patient Active Problem List   Diagnosis Date Noted  . Syncope 04/16/2017  . DM (diabetes mellitus), type 2 ([place]) 04/16/2017  . H/O enucleation of left eyeball 10/30/2015  . Macular degeneration, right eye 10/30/2015  . Pseudophakia, right eye 10/30/2015  . BMI 25.0-25.9,adult 09/26/2015  . Chronic rhinitis 05/05/2015  . Basal cell carcinoma 02/10/2015  . DM neuropathy, type II diabetes mellitus ([place]) 02/10/2015  . Atrial fibrillation [I48.91] 02/03/2015  . Diabetes mellitus ([place]) 11/24/2014  . T2_NIDDM w/Stage 3 CKD (GFR 40 ml/min) 11/08/2014  . Vitamin D deficiency 11/08/2014  . Medication management 11/08/2014  . BP (high blood pressure) 11/08/2014  . Polypharmacy 11/08/2014  . Encounter for therapeutic drug monitoring 01/03/2014  . Hyperlipidemia   . Sick sinus syndrome ([place])   . Retinopathy   . Long term current use of anticoagulant therapy 04/30/2012  . History of  anticoagulant therapy 04/30/2012  . Tachycardia-bradycardia ([place]) 04/19/2012  . PAF (paroxysmal atrial fibrillation) ([place]) 04/18/2011  . Abnormal finding on thyroid function test 04/18/2011  . HCD (hypertensive cardiovascular disease) 04/18/2011    Past Surgical History:  Procedure Laterality Date  . DENTAL SURGERY    . PACEMAKER INSERTION  04/20/12   MDT Adapta L implanted by Dr Rayann Heman  . PERMANENT PACEMAKER INSERTION N/A 04/20/2012   Procedure: PERMANENT PACEMAKER INSERTION;  Surgeon: Thompson Grayer, MD;  Location[facility] C[facility] LAB;  Service: Cardiovascular;  Laterality: N/A;    OB History    No data available       Home Medications    Prior to Admission medications   Medication Sig Start Date End Date Taking? Authorizing Provider  amLODipine (NORVASC) 10 MG tablet TAKE ONE (1) TABLET BY MOUTH EVERY DAY (OVERDUE FOR AN APPTMT. CALL TO SCHEDULE) 03/26/17  Yes Allred, Jeneen Rinks, MD  b complex vitamins tablet Take 1 tablet by mouth daily.   Yes [provider]  Cholecalciferol (VITAMIN D PO) Take 1 tablet by mouth.   Yes [provider]  Coenzyme Q10 (CO Q 10) 100 MG CAPS Take 100 mg by mouth daily.    Yes [provider]  losartan (COZAAR) 100 MG tablet TAKE ONE (1) TABLET BY MOUTH EVERY DAY *MUST MAKE APPOINTMENT FOR FURTHER REFILLS* 03/26/17  Yes Vicie Mutters, PA-C  Magnesium 250 MG TABS Take 250 mg by mouth 2 (two) times daily.    Yes [provider]  metFORMIN (GLUCOPHAGE) 500 MG tablet TAKE 2 TABLETS(1000 MG) BY MOUTH TWICE DAILY 09/18/16  Yes Vicie Mutters, PA-C  metoprolol (TOPROL-XL) 200 MG 24 hr tablet TAKE ONE (1) TABLET BY MOUTH EVERY DAY Patient taking differently: TAKE HALF (0.5) A TABLET BY MOUTH TWICE A DAY 02/17/17  Yes Unk Pinto, MD  nitroGLYCERIN (NITROSTAT) 0.4 MG SL tablet Place 1 tablet (0.4 mg total) under the tongue every 5 (five) minutes as needed for chest pain. 05/08/16 05/08/17 Yes Vicie Mutters, PA-C  warfarin (COUMADIN) 5  MG tablet TAKE AS DIRECTED BY COUMADIN CLINIC 02/04/17  Yes Forcucci, Courtney, PA-C  warfarin (COUMADIN) 5 MG tablet Take 2.5-5 mg by mouth daily. Per blood work last week. Pt takes 1 tab (5 mg) on Monday, Wednesday, Friday and 0.5 tab (2.5 mg) on Tuesday, Thursday    Yes [provider]    Family History Family History  Problem Relation Age of Onset  . Heart attack Father   . Hypertension Mother   . Cancer Other     Social History Social History  Substance Use Topics  . Smoking status: Never Smoker  . Smokeless tobacco: Never Used  . Alcohol use No     Allergies   Patient has no known allergies.   Review of Systems Review of Systems  Constitutional: Positive for fatigue. Negative for chills and fever.  HENT: Negative for ear pain and sore throat.   Eyes: Negative for pain and visual disturbance.  Respiratory: Negative for cough and shortness of breath.   Cardiovascular: Positive for palpitations and near-syncope. Negative for chest pain.  Gastrointestinal: Negative for abdominal pain and vomiting.  Genitourinary: Negative for dysuria and hematuria.  Musculoskeletal: Negative for arthralgias and back pain.  Skin: Negative for color change and rash.  Neurological: Negative for seizures and syncope.  All other systems reviewed and are negative.    Physical Exam Updated Vital Signs BP (!) 190/84 (BP Location: Right Arm) Comment: rn aware  Pulse 88   Temp 98 F (36.7 C) (Oral)   Resp 20   Ht 5\' 2"  (1.575 m)   Wt 64.4 kg (141 lb 14.4 oz)   SpO2 98%   BMI 25.95 kg/m   Physical Exam  Constitutional: She appears well-developed.  HENT:  Head: Normocephalic and atraumatic.  Eyes:  L eye removed.  Neck: Neck supple.  Cardiovascular: Regular rhythm.  Tachycardia present.   No murmur heard. Pulmonary/Chest: Effort normal and breath sounds normal. No respiratory distress.  Abdominal: Soft. There is no tenderness.  Musculoskeletal: She exhibits no edema.    Neurological: She is alert. No cranial nerve deficit. Coordination normal.  5/5 motor strength and intact sensation in all extremities. Finger-to-nose intact bilaterally  Skin: Skin is warm and dry.  Nursing note and vitals reviewed.    ED Treatments / Results  Labs (all labs ordered are listed, but only abnormal results are displayed) Labs Reviewed  BASIC METABOLIC PANEL - Abnormal; Notable for the following:       Result Value   Glucose, Bld 143 (*)    GFR calc non Af Amer 48 (*)    GFR calc Af Amer 56 (*)    All other components within normal limits  URINALYSIS, ROUTINE W REFLEX MICROSCOPIC - Abnormal; Notable for the following:    Color, Urine STRAW (*)    Specific Gravity, Urine 1.004 (*)    All other components within normal limits  PROTIME-INR - Abnormal; Notable for the following:  Prothrombin Time 15.9 (*)    All other components within normal limits  GLUCOSE, CAPILLARY - Abnormal; Notable for the following:    Glucose-Capillary 188 (*)    All other components within normal limits  CBG MONITORING, ED - Abnormal; Notable for the following:    Glucose-Capillary 133 (*)    All other components within normal limits  CBC  TROPONIN I  TROPONIN I  TROPONIN I  PROTIME-INR    EKG  EKG Interpretation None       Radiology Dg Chest 2 View  Result Date: 04/16/2017 CLINICAL DATA:  Syncope.  Cardiac arrhythmia EXAM: CHEST  2 VIEW COMPARISON:  September 20, 2014 FINDINGS: There is mild scarring in the left base. There is no edema or consolidation. The heart size and pulmonary vascularity are normal. Pacemaker leads are attached to the right atrium and right ventricle. No adenopathy. Bones are osteoporotic. There is aortic atherosclerosis. IMPRESSION: No edema or consolidation. Stable mild scarring left base. Stable cardiac silhouette. Aortic atherosclerosis. Electronically Signed   By: Lowella Grip III M.D.   On: 04/16/2017 16:30    Procedures Procedures (including  critical care time)  Medications Ordered in ED Medications  metoprolol succinate (TOPROL-XL) 24 hr tablet 100 mg (100 mg Oral Given 04/16/17 1708)  insulin aspart (novoLOG) injection 0-5 Units (0 Units Subcutaneous Not Given 04/16/17 2317)  insulin aspart (novoLOG) injection 0-15 Units (not administered)  amLODipine (NORVASC) tablet 10 mg (not administered)  sodium chloride flush (NS) 0.9 % injection 3 mL (3 mLs Intravenous Given 04/16/17 2257)  enoxaparin (LOVENOX) injection 30 mg (30 mg Subcutaneous Not Given 04/16/17 2300)  0.9 %  sodium chloride infusion ( Intravenous New Bag/Given 04/16/17 2257)  Warfarin - Pharmacist Dosing Inpatient (not administered)  sodium chloride 0.9 % bolus 1,000 mL (0 mLs Intravenous Stopped 04/16/17 1751)  amLODipine (NORVASC) tablet 10 mg (10 mg Oral Given 04/16/17 2254)  losartan (COZAAR) tablet 100 mg (100 mg Oral Given 04/16/17 2254)  warfarin (COUMADIN) tablet 7.5 mg (7.5 mg Oral Given 04/16/17 2254)     Initial Impression / Assessment and Plan / ED Course  I have reviewed the triage vital signs and the nursing notes.  Pertinent labs & imaging results that were available during my care of the patient were reviewed by me and considered in my medical decision making (see chart for details).     83 year old female history of A. fib on Coumadin, HTN, HLD, SSS S/P pacemaker, diabetes, CKD stage III, and enucleation of L eyeball who presents with tachycardia and sycopal event.  Patient states she felt generally weak today while she was making a soup.  She sat down and had momentary "blackout" for several seconds.  She currently lives alone in an apartment and is occasionally visited by her landlord.  She states general sadness as she currently has irreversible macular degeneration of her right eye after previous enucleation of her left eye due to surgery.  She states she has been compliant with her medicines but has not yet taken them today.  She denies fevers or other recent  illnesses.    Afebrile, tachycardic 130s, otherwise VSS.  No focal neuro deficits.  Lungs clear to auscultation bilaterally.  Abdomen soft benign throughout.  Initial cardiac rhythm and EKG showing sinus tachycardia.  However bedside rhythm noted to be irregularly irregular with rate of 130.  Patient in A. fib with RVR.  Patient given home dose of metoprolol XR 100mg  (she states she takes 100 mg twice daily).  Tachycardia resolved shortly after metoprolol administration.  Patient appears subtherapeutic on INR.  Remaining home medicines ordered.  Nursing attempted to stand and ambulate patient.  Nursing staff state patient is very unstable when attempting to walk her.  Due to concern of living alone and unstable gait, patient admitted for further management and evaluation.  Patient stable at time of transfer.  Pt care d/w Dr. Oleta Mouse  Final Clinical Impressions(s) / ED Diagnoses   Final diagnoses:  Syncope and collapse  Weakness    New Prescriptions Current Discharge Medication List       Kylie Emerald, MD 04/17/17 3013    Forde Dandy, MD 04/17/17 1409

## 2017-04-17 ENCOUNTER — Observation Stay (HOSPITAL_COMMUNITY): Payer: Medicare Other

## 2017-04-17 DIAGNOSIS — Z8249 Family history of ischemic heart disease and other diseases of the circulatory system: Secondary | ICD-10-CM | POA: Diagnosis not present

## 2017-04-17 DIAGNOSIS — Z66 Do not resuscitate: Secondary | ICD-10-CM | POA: Diagnosis present

## 2017-04-17 DIAGNOSIS — I495 Sick sinus syndrome: Secondary | ICD-10-CM

## 2017-04-17 DIAGNOSIS — I482 Chronic atrial fibrillation: Secondary | ICD-10-CM | POA: Diagnosis not present

## 2017-04-17 DIAGNOSIS — E1122 Type 2 diabetes mellitus with diabetic chronic kidney disease: Secondary | ICD-10-CM | POA: Diagnosis present

## 2017-04-17 DIAGNOSIS — Z7984 Long term (current) use of oral hypoglycemic drugs: Secondary | ICD-10-CM | POA: Diagnosis not present

## 2017-04-17 DIAGNOSIS — N183 Chronic kidney disease, stage 3 (moderate): Secondary | ICD-10-CM | POA: Diagnosis not present

## 2017-04-17 DIAGNOSIS — Z85828 Personal history of other malignant neoplasm of skin: Secondary | ICD-10-CM | POA: Diagnosis not present

## 2017-04-17 DIAGNOSIS — I480 Paroxysmal atrial fibrillation: Secondary | ICD-10-CM | POA: Diagnosis not present

## 2017-04-17 DIAGNOSIS — Z45018 Encounter for adjustment and management of other part of cardiac pacemaker: Secondary | ICD-10-CM | POA: Diagnosis not present

## 2017-04-17 DIAGNOSIS — R55 Syncope and collapse: Secondary | ICD-10-CM

## 2017-04-17 DIAGNOSIS — H5450 Low vision, one eye, unspecified eye: Secondary | ICD-10-CM | POA: Diagnosis not present

## 2017-04-17 DIAGNOSIS — E785 Hyperlipidemia, unspecified: Secondary | ICD-10-CM | POA: Diagnosis present

## 2017-04-17 DIAGNOSIS — I6521 Occlusion and stenosis of right carotid artery: Secondary | ICD-10-CM | POA: Diagnosis not present

## 2017-04-17 DIAGNOSIS — I1310 Hypertensive heart and chronic kidney disease without heart failure, with stage 1 through stage 4 chronic kidney disease, or unspecified chronic kidney disease: Secondary | ICD-10-CM | POA: Diagnosis present

## 2017-04-17 DIAGNOSIS — Z7901 Long term (current) use of anticoagulants: Secondary | ICD-10-CM | POA: Diagnosis not present

## 2017-04-17 DIAGNOSIS — I4891 Unspecified atrial fibrillation: Secondary | ICD-10-CM | POA: Diagnosis not present

## 2017-04-17 DIAGNOSIS — Z9001 Acquired absence of eye: Secondary | ICD-10-CM | POA: Diagnosis not present

## 2017-04-17 DIAGNOSIS — E11319 Type 2 diabetes mellitus with unspecified diabetic retinopathy without macular edema: Secondary | ICD-10-CM | POA: Diagnosis present

## 2017-04-17 LAB — TROPONIN I

## 2017-04-17 LAB — PROTIME-INR
INR: 1.32
Prothrombin Time: 16.5 seconds — ABNORMAL HIGH (ref 11.4–15.2)

## 2017-04-17 LAB — GLUCOSE, CAPILLARY
GLUCOSE-CAPILLARY: 138 mg/dL — AB (ref 65–99)
GLUCOSE-CAPILLARY: 152 mg/dL — AB (ref 65–99)
Glucose-Capillary: 154 mg/dL — ABNORMAL HIGH (ref 65–99)

## 2017-04-17 MED ORDER — WARFARIN SODIUM 5 MG PO TABS
5.0000 mg | ORAL_TABLET | Freq: Once | ORAL | Status: AC
  Administered 2017-04-17: 5 mg via ORAL
  Filled 2017-04-17: qty 1

## 2017-04-17 NOTE — Care Management Obs Status (Signed)
[facility] NOTIFICATION   Patient Details  Name: Kylie Sanchez MRN: [id number] Date of Birth: [birth year]   Medicare Observation Status Notification Given:  Yes    Carles Collet, RN 04/17/2017, 4:16 PM

## 2017-04-17 NOTE — Evaluation (Signed)
Occupational Therapy Evaluation and Discharge Patient Details Name: Kylie Sanchez MRN: [id number] DOB: 09-07-1925 Today's Date: 04/17/2017    History of Present Illness 83 yo admitted after syncope at home with Afib. PMhx: Afib, HTN, HLD, pacemaker, CKd, Left-eye enucleation due  to basal cell carcinoma   Clinical Impression   Pt demonstrated ability to perform self care at a supervision level, primarily due to low vision and unfamiliar environment and hx of syncopal episode. Recommending HHOT for safety assessment and compensatory strategies and adaptations for low vision in the home. No further acute OT needs.   Follow Up Recommendations  Home health OT    Equipment Recommendations  None recommended by OT    Recommendations for Other Services       Precautions / Restrictions Precautions Precautions: Fall Restrictions Weight Bearing Restrictions: No      Mobility Bed Mobility Overal bed mobility: Modified Independent     General bed mobility comments: used rail, no assist  Transfers Overall transfer level: Needs assistance Equipment used: None Transfers: Sit to/from Stand Sit to Stand: Supervision         General transfer comment: from bed and toilet, did not use RW as pt does not use inside her house    Balance Overall balance assessment: Needs assistance   Sitting balance-Leahy Scale: Fair Sitting balance - Comments: no LOB with donning socks     Standing balance-Leahy Scale: Fair Standing balance comment: no LOB with static standing, walked throughout room slowly, but without assist.                           ADL either performed or assessed with clinical judgement   ADL Overall ADL's : Needs assistance/impaired Eating/Feeding: Independent;Sitting   Grooming: Standing;Oral care;Supervision/safety   Upper Body Bathing: Set up;Sitting   Lower Body Bathing: Supervison/ safety;Sit to/from stand   Upper Body Dressing : Set up;Sitting   Lower  Body Dressing: Supervision/safety;Sit to/from stand   Toilet Transfer: Supervision/safety;Ambulation;Regular Toilet   Toileting- Water quality scientist and Hygiene: Supervision/safety;Sit to/from stand       Functional mobility during ADLs: Supervision/safety (no device within room) General ADL Comments: Pt primarily hindered by low vision and unfamiliar environment. Recommended medical alert system.     Vision Patient Visual Report: No change from baseline Additional Comments: no eye on L, low vision on R, MD has told pt she will eventually be completely blind, recommended [facility]      Perception     Praxis      Pertinent Vitals/Pain Pain Assessment: No/denies pain     Hand Dominance Right   Extremity/Trunk Assessment Upper Extremity Assessment Upper Extremity Assessment: Overall WFL for tasks assessed   Lower Extremity Assessment Lower Extremity Assessment: Defer to PT evaluation   Cervical / Trunk Assessment Cervical / Trunk Assessment: Kyphotic   Communication Communication Communication: HOH   Cognition Arousal/Alertness: Awake/alert Behavior During Therapy: WFL for tasks assessed/performed Overall Cognitive Status: Within Functional Limits for tasks assessed                                     General Comments       Exercises     Shoulder Instructions      Home Living Family/patient expects to be discharged to:: Private residence Living Arrangements: Alone Available Help at Discharge: Family;Available PRN/intermittently Type of Home: Apartment Home Access: Level  entry;Elevator     Home Layout: One level         Bathroom Toilet: Standard     Home Equipment: Walker - 2 wheels;Shower seat          Prior Functioning/Environment Level of Independence: Independent        Comments: pt does not use AD in apartment, uses RW when outside, does not drive, brother takes her out about 1x/wk. She has groceries  delivered and housekeeper for cleaning        OT Problem List: Impaired vision/perception;Impaired balance (sitting and/or standing)      OT Treatment/Interventions:      OT Goals(Current goals can be found in the care plan section) Acute Rehab OT Goals Patient Stated Goal: return home  OT Frequency:     Barriers to D/C:            Co-evaluation              AM-PAC PT "6 Clicks" Daily Activity     Outcome Measure Help from another person eating meals?: None Help from another person taking care of personal grooming?: A Little Help from another person toileting, which includes using toliet, bedpan, or urinal?: A Little Help from another person bathing (including washing, rinsing, drying)?: A Little Help from another person to put on and taking off regular upper body clothing?: None Help from another person to put on and taking off regular lower body clothing?: A Little 6 Click Score: 20   End of Session Equipment Utilized During Treatment: Gait belt  Activity Tolerance: Patient tolerated treatment well Patient left: in chair;with call bell/phone within reach;with family/visitor present  OT Visit Diagnosis: Low vision, both eyes (H54.2);Unsteadiness on feet (R26.81)                Time: 5498-2641 OT Time Calculation (min): 30 min Charges:  OT General Charges $OT Visit: 1 Procedure OT Evaluation $OT Eval Moderate Complexity: 1 Procedure G-Codes: OT G-codes **NOT FOR INPATIENT CLASS** Functional Assessment Tool Used: Clinical judgement Functional Limitation: Self care Self Care Current Status (R8309): At least 1 percent but less than 20 percent impaired, limited or restricted Self Care Discharge Status [phone]): At least 1 percent but less than 20 percent impaired, limited or restricted    Kylie Sanchez 04/17/2017, 2:30 PM  [phone]

## 2017-04-17 NOTE — Progress Notes (Signed)
PROGRESS NOTE    Kylie Sanchez  HWE:[id number] DOB: April 18, 1925 DOA: 04/16/2017 PCP: Unk Pinto, MD    Brief Narrative:  83 y.o. female with medical history significant of a.fib on chronic anticoagulation, HTN  HLD, Pre-diabetes, Left-eye enucleation due  to basal cell carcinoma, SSS sp pacemaker, CKD stage 3  Presented with [employer] happening today patient was cooking herself food in AM and turned  walked to the table to sit down to read a book . When she was sitting down she suddenly passed out She thinks that she completely lost consciousness for 3-4 seconds   Assessment & Plan:    Syncope - Could be secondary to uncontrolled atrial fibrillation initially. Has since resolved - We'll obtain physical therapy evaluation next a.m. - Awaiting further workup as echocardiogram and carotid Dopplers ordered  Visual disturbance - Most likely related to syncopal episode which could've been related to transient uncontrolled atrial fibrillation. Has resolved and patient has not shown any focal deficits. Has not had any further complaints regarding her visual fields per her conversation with me. As such I do not feel that I need to pursue a TIA workup. Should patient develop any further neurological deficits would strongly consider.  Active Problems:    Long term current use of anticoagulant therapy - Pharmacy managing Coumadin, INR subtherapeutic.    Atrial fibrillation /Tachycardia-bradycardia (HCC)/  Sick sinus syndrome [facility]) - Cardiology on board and assisting - Pharmacy managing Coumadin    Diabetes mellitus ([place]) - Continue sliding scale insulin - Carb modified diet   DVT prophylaxis: On Coumadin Code Status: DO NOT RESUSCITATE Family Communication: Discussed directly with patient Disposition Plan: Pending improvement in condition   Consultants:   Cardiology   Procedures: Echocardiogram/carotid Dopplers   Antimicrobials: None   Subjective: The patient has no new  complaints. No acute issues overnight  Objective: Vitals:   04/17/17 0426 04/17/17 0435 04/17/17 0945 04/17/17 1432  BP: (!) 130/59 (!) 130/59 (!) 127/49 (!) 130/51  Pulse: 68 66 77 75  Resp: 18 18 18 18   Temp: 97.8 F (36.6 C) 97.8 F (36.6 C) 97.5 F (36.4 C) 97.9 F (36.6 C)  TempSrc: Oral Oral Oral Oral  SpO2: 95% 95% 98% 96%  Weight:  60.6 kg (133 lb 9.6 oz)    Height:        Intake/Output Summary (Last 24 hours) at 04/17/17 1706 Last data filed at 04/17/17 1200  Gross per 24 hour  Intake              945 ml  Output                0 ml  Net              945 ml   Filed Weights   04/16/17 1522 04/16/17 2227 04/17/17 0435  Weight: 64 kg (141 lb) 64.4 kg (141 lb 14.4 oz) 60.6 kg (133 lb 9.6 oz)    Examination:  General exam: Appears calm and comfortable , In no acute distress Respiratory system: Clear to auscultation. Respiratory effort normal. Cardiovascular system: IRRR. No JVD Gastrointestinal system: Abdomen is nondistended, soft and nontender. No organomegaly or masses felt. Normal bowel sounds heard. Central nervous system: Alert and oriented. No focal neurological deficits. Extremities: Symmetric 5 x 5 power. Skin: No rashes, lesions or ulcers, on limited exam. Psychiatry:Mood & affect appropriate.    Data Reviewed: I have personally reviewed following labs and imaging studies  CBC:  Recent Labs Lab 04/16/17 1530  WBC 5.9  HGB 12.1  HCT 36.4  MCV 89.7  PLT 268   Basic Metabolic Panel:  Recent Labs Lab 04/16/17 1530  NA 137  K 4.2  CL 104  CO2 22  GLUCOSE 143*  BUN 17  CREATININE 1.00  CALCIUM 9.8   GFR: Estimated Creatinine Clearance: 32.7 mL/min (by C-G formula based on SCr of 1 mg/dL). Liver Function Tests: No results for input(s): AST, ALT, ALKPHOS, BILITOT, PROT, ALBUMIN in the last 168 hours. No results for input(s): LIPASE, AMYLASE in the last 168 hours. No results for input(s): AMMONIA in the last 168 hours. Coagulation  Profile:  Recent Labs Lab 04/16/17 1530 04/17/17 0430  INR 1.26 1.32   Cardiac Enzymes:  Recent Labs Lab 04/16/17 2246 04/17/17 0430 04/17/17 1034  TROPONINI <0.03 <0.03 <0.03   BNP (last 3 results) No results for input(s): PROBNP in the last 8760 hours. HbA1C: No results for input(s): HGBA1C in the last 72 hours. CBG:  Recent Labs Lab 04/16/17 1523 04/16/17 2316 04/17/17 0621 04/17/17 1203 04/17/17 1619  GLUCAP 133* 188* 138* 154* 152*   Lipid Profile: No results for input(s): CHOL, HDL, LDLCALC, TRIG, CHOLHDL, LDLDIRECT in the last 72 hours. Thyroid Function Tests: No results for input(s): TSH, T4TOTAL, FREET4, T3FREE, THYROIDAB in the last 72 hours. Anemia Panel: No results for input(s): VITAMINB12, FOLATE, FERRITIN, TIBC, IRON, RETICCTPCT in the last 72 hours. Sepsis Labs: No results for input(s): PROCALCITON, LATICACIDVEN in the last 168 hours.  No results found for this or any previous visit (from the past 240 hour(s)).       Radiology Studies: Dg Chest 2 View  Result Date: 04/16/2017 CLINICAL DATA:  Syncope.  Cardiac arrhythmia EXAM: CHEST  2 VIEW COMPARISON:  September 20, 2014 FINDINGS: There is mild scarring in the left base. There is no edema or consolidation. The heart size and pulmonary vascularity are normal. Pacemaker leads are attached to the right atrium and right ventricle. No adenopathy. Bones are osteoporotic. There is aortic atherosclerosis. IMPRESSION: No edema or consolidation. Stable mild scarring left base. Stable cardiac silhouette. Aortic atherosclerosis. Electronically Signed   By: Lowella Grip III M.D.   On: 04/16/2017 16:30        Scheduled Meds: . amLODipine  10 mg Oral Daily  . enoxaparin (LOVENOX) injection  30 mg Subcutaneous QHS  . insulin aspart  0-15 Units Subcutaneous TID WC  . insulin aspart  0-5 Units Subcutaneous QHS  . metoprolol succinate  100 mg Oral BID  . sodium chloride flush  3 mL Intravenous Q12H  .  warfarin  5 mg Oral ONCE-1800  . Warfarin - Pharmacist Dosing Inpatient   Does not apply q1800   Continuous Infusions: . sodium chloride Stopped (04/17/17 1454)     LOS: 0 days    Time spent: > 35 minutes    Velvet Bathe, MD [facility] Pager [phone]  If 7PM-7AM, please contact night-coverage [email or web address] Password [email or web address] 04/17/2017, 5:06 PM

## 2017-04-17 NOTE — Progress Notes (Addendum)
ANTICOAGULATION CONSULT NOTE - Follow-up Consult  Pharmacy Consult for Coumadin Indication: atrial fibrillation  No Known Allergies  Patient Measurements: Height: 5\' 2"  (157.5 cm) Weight: 133 lb 9.6 oz (60.6 kg) IBW/kg (Calculated) : 50.1  Vital Signs: Temp: 97.8 F (36.6 C) (06/07 0435) Temp Source: Oral (06/07 0435) BP: 127/49 (06/07 0945) Pulse Rate: 77 (06/07 0945)  Labs:  Recent Labs  04/16/17 1530 04/16/17 2246 04/17/17 0430  HGB 12.1  --   --   HCT 36.4  --   --   PLT 266  --   --   LABPROT 15.9*  --  16.5*  INR 1.26  --  1.32  CREATININE 1.00  --   --   TROPONINI  --  <0.03 <0.03    Estimated Creatinine Clearance: 32.7 mL/min (by C-G formula based on SCr of 1 mg/dL).   Medical History: Past Medical History:  Diagnosis Date  . Cataract   . CKD (chronic kidney disease) stage 3, GFR 30-59 ml/min    due to DM  . Dizziness   . Hyperlipidemia   . Hypertensive cardiovascular disease   . Paroxysmal atrial fibrillation (HCC)   . Retinopathy   . Sick sinus syndrome ([place])   . Type II or unspecified type diabetes mellitus without mention of complication, not stated as uncontrolled     Medications:  Prescriptions Prior to Admission  Medication Sig Dispense Refill Last Dose  . amLODipine (NORVASC) 10 MG tablet TAKE ONE (1) TABLET BY MOUTH EVERY DAY (OVERDUE FOR AN APPTMT. CALL TO SCHEDULE) 15 tablet 0 04/15/2017 at Unknown time  . b complex vitamins tablet Take 1 tablet by mouth daily.   04/15/2017 at Unknown time  . Cholecalciferol (VITAMIN D PO) Take 1 tablet by mouth.   04/15/2017 at Unknown time  . Coenzyme Q10 (CO Q 10) 100 MG CAPS Take 100 mg by mouth daily.    04/15/2017 at Unknown time  . losartan (COZAAR) 100 MG tablet TAKE ONE (1) TABLET BY MOUTH EVERY DAY *MUST MAKE APPOINTMENT FOR FURTHER REFILLS* 30 tablet 1 04/15/2017 at Unknown time  . Magnesium 250 MG TABS Take 250 mg by mouth 2 (two) times daily.    04/15/2017 at Unknown time  . metFORMIN (GLUCOPHAGE) 500  MG tablet TAKE 2 TABLETS(1000 MG) BY MOUTH TWICE DAILY 360 tablet 1 04/15/2017 at Unknown time  . metoprolol (TOPROL-XL) 200 MG 24 hr tablet TAKE ONE (1) TABLET BY MOUTH EVERY DAY (Patient taking differently: TAKE HALF (0.5) A TABLET BY MOUTH TWICE A DAY) 90 tablet 0 04/15/2017 at 2100  . nitroGLYCERIN (NITROSTAT) 0.4 MG SL tablet Place 1 tablet (0.4 mg total) under the tongue every 5 (five) minutes as needed for chest pain. 100 tablet 3 04/15/2017 at Unknown time  . warfarin (COUMADIN) 5 MG tablet TAKE AS DIRECTED BY COUMADIN CLINIC 30 tablet 2 04/15/2017 at 9pm  . warfarin (COUMADIN) 5 MG tablet Take 2.5-5 mg by mouth daily. Per blood work last week. Pt takes 1 tab (5 mg) on Monday, Wednesday, Friday and 0.5 tab (2.5 mg) on Tuesday, Thursday    04/15/2017 at 2100   Scheduled:  . amLODipine  10 mg Oral Daily  . enoxaparin (LOVENOX) injection  30 mg Subcutaneous QHS  . insulin aspart  0-15 Units Subcutaneous TID WC  . insulin aspart  0-5 Units Subcutaneous QHS  . metoprolol succinate  100 mg Oral BID  . sodium chloride flush  3 mL Intravenous Q12H  . warfarin  5 mg Oral  ONCE-1800  . Warfarin - Pharmacist Dosing Inpatient   Does not apply q1800   Infusions:  . sodium chloride 75 mL/hr at 04/16/17 2257    Assessment: 83yo female presents s/p near syncope w/ generalized weakness, to continue Coumadin for Afib; per outpt clinic notes pt is known to be noncompliant to INR checks and PCP appts 2/2 transporation; Admit INR 1.26 below goal. CBC stable. Given patient's advanced age and history of non-compliance, will give a smaller boosted dose today with anticipation of INR jump in the next 24-48 hours.  INR: 1.32  Goal of Therapy:  INR 2-3   Plan:  Warfarin 5mg  PO x1 tonight  Discontinue Lovenox when INR therapeutic Daily INR/CBC Monitor for s/sx of bleeding  Georga Bora, PharmD Clinical Pharmacist 04/17/2017 9:52 AM

## 2017-04-17 NOTE — Evaluation (Signed)
Physical Therapy Evaluation Patient Details Name: Kylie Sanchez MRN: [id number] DOB: 03/07/1925 Today's Date: 04/17/2017   History of Present Illness  83 yo admitted after syncope at home with Afib. PMhx: Afib, HTN, HLD, pacemaker, CKd, Left-eye enucleation due  to basal cell carcinoma  Clinical Impression  Pt very pleasant and enjoys living alone and caring for herself. Pt has 50 yo brother who lives in [place] who drives and takes her out on occasion. Pt does not leave her apartment frequently but states she has no difficulty with mobility at baseline. Pt without dizziness, CP or fatigue with activity. Pt with decreased balance, strength and gait who will benefit from acute therapy to maximize mobility, independence and gait to return to PLOF.   Orthostatic BPs  Supine 137/64 (81), HR 79  Sitting 133/74 (88), HR 81     Standing 140/65 (84), HR 84          Follow Up Recommendations Home health PT    Equipment Recommendations  None recommended by PT    Recommendations for Other Services       Precautions / Restrictions Precautions Precautions: Fall      Mobility  Bed Mobility Overal bed mobility: Needs Assistance Bed Mobility: Rolling;Sidelying to Sit Rolling: Min guard Sidelying to sit: Min assist       General bed mobility comments: cues for sequence with assist to elevate trunk from surface  Transfers Overall transfer level: Needs assistance   Transfers: Sit to/from Stand Sit to Stand: Min guard         General transfer comment: cues for hand placement, safety and sequence  Ambulation/Gait Ambulation/Gait assistance: Min assist Ambulation Distance (Feet): 350 Feet Assistive device: Rolling walker (2 wheeled) Gait Pattern/deviations: Step-through pattern;Decreased stride length;Trunk flexed   Gait velocity interpretation: Below normal speed for age/gender General Gait Details: mod cues to step into RW and to keep RW with her as she goes around  Engineer, [employer] [place] (Stroke Patients Only)       Balance Overall balance assessment: Needs assistance   Sitting balance-Leahy Scale: Fair       Standing balance-Leahy Scale: Poor                               Pertinent Vitals/Pain Pain Assessment: No/denies pain    Home Living Family/patient expects to be discharged to:: Private residence Living Arrangements: Alone Available Help at Discharge: Family;Available PRN/intermittently Type of Home: Apartment Home Access: Level entry     Home Layout: One level Home Equipment: Walker - 2 wheels;Shower seat      Prior Function Level of Independence: Independent         Comments: pt does not use AD in apartment, uses RW when outside, does not drive, brother takes her out about 1x/wk. She has groceries delivered and housekeeper for cleaning     Hand Dominance        Extremity/Trunk Assessment   Upper Extremity Assessment Upper Extremity Assessment: Generalized weakness    Lower Extremity Assessment Lower Extremity Assessment: Generalized weakness    Cervical / Trunk Assessment Cervical / Trunk Assessment: Kyphotic  Communication   Communication: HOH  Cognition Arousal/Alertness: Awake/alert Behavior During Therapy: WFL for tasks assessed/performed Overall Cognitive Status: Within Functional Limits for tasks assessed  General Comments      Exercises     Assessment/Plan    PT Assessment Patient needs continued PT services  PT Problem List Decreased mobility;Decreased strength;Decreased activity tolerance;Decreased balance;Decreased knowledge of use of DME       PT Treatment Interventions Gait training;Therapeutic exercise;Patient/family education;Balance training;Functional mobility training;Therapeutic activities;DME instruction    PT Goals (Current goals can be found in the  Care Plan section)  Acute Rehab PT Goals Patient Stated Goal: return home PT Goal Formulation: With patient Time For Goal Achievement: 05/01/17 Potential to Achieve Goals: Fair    Frequency Min 3X/week   Barriers to discharge Decreased caregiver support      Co-evaluation               AM-PAC PT "6 Clicks" Daily Activity  Outcome Measure Difficulty turning over in bed (including adjusting bedclothes, sheets and blankets)?: A Little Difficulty moving from lying on back to sitting on the side of the bed? : Total Difficulty sitting down on and standing up from a chair with arms (e.g., wheelchair, bedside commode, etc,.)?: A Little Help needed moving to and from a bed to chair (including a wheelchair)?: A Little Help needed walking in hospital room?: A Little Help needed climbing 3-5 steps with a railing? : A Lot 6 Click Score: 15    End of Session Equipment Utilized During Treatment: Gait belt Activity Tolerance: Patient tolerated treatment well Patient left: in chair;with call bell/phone within reach;with chair alarm set Nurse Communication: Mobility status PT Visit Diagnosis: Muscle weakness (generalized) (M62.81);Difficulty in walking, not elsewhere classified (R26.2)    Time: 8184-0375 PT Time Calculation (min) (ACUTE ONLY): 31 min   Charges:   PT Evaluation $PT Eval Moderate Complexity: 1 Procedure PT Treatments $Gait Training: 8-22 mins   PT G Codes:   PT G-Codes **NOT FOR INPATIENT CLASS** Functional Assessment Tool Used: Clinical judgement;AM-PAC 6 Clicks Basic Mobility Functional Limitation: Mobility: Walking and moving around Mobility: Walking and Moving Around Current Status (O3606): At least 20 percent but less than 40 percent impaired, limited or restricted Mobility: Walking and Moving Around Goal Status [phone]): At least 1 percent but less than 20 percent impaired, limited or restricted    Elwyn Reach, Leigh   Sandy Salaam  Asjia Berrios 04/17/2017, 11:37 AM

## 2017-04-17 NOTE — Consult Note (Signed)
Cardiology Consultation:   Patient ID: Kylie Sanchez; [id number]; 1925/08/09   Admit date: 04/16/2017 Date of Consult: 04/17/2017  Primary Care Provider: Unk Pinto, MD Primary Cardiologist: Dr. Rayann Heman Primary Electrophysiologist:  Dr. Rayann Heman   Patient Profile:   Kylie Sanchez is a  83 y.o. female DNR with a hx of cataract, CKD stage 3, hyperlipidemia, hypertensive, paroxysmal atrial fibrillation (on Coumadin), sick sinus syndrome s/p permanenet pacemaker 2013 (medtronic), Type II diabetes, left-eye enucleation due to basal cell carcinoma. who is being seen today for the evaluation of syncope and atrial fibrillation at the request of Dr. Wendee Beavers.   History of Present Illness:   Chandrika Sanchez follows Dr. Rayann Heman and does not appear to be complaint with going to appointments. She has numerous no show and appointment cancellations through out 2017-2018. Last visit with Dr. Rayann Heman was October 11, 2015. She has poor overall compliance with taking her medications, follow-up appointments, coumadin clinic appointments, pacemaker checks ect. This is due to not understanding her medications and lack of transportation. PT had consulted on her just prior to my examination and she reports her strength is good but she has significant problems with her vision.   She lives by herself and cooks for herself. She went to sit down after cutting up vegetables and her vision in the right eye went blurry for a few seconds. She admits that she did not COMPLETELY pass out because she was aware of time and that she was sitting. She has told the hospitalist and ER provider that she had true syncope. This happened to her once before approx 10 years ago and she stayed in the hospital for 3 days and was told she was dehydrated. She did not have any associated syncopal symptoms or chest pains. No n/v/d. Cannot ambulate in the ED due to severe weakness. In the ER she is in atrial fibrillation with RVR she has not been taking her beta  blocker at home but was given a dose in the ED and responded well. PPM interrogated in the ED and it is without arrhythmia.   She was admitted to to the medicine service.  She is currently rate controlled 70-80s not signs of dehydration Troponin 0.03-- 0.03 -- 0.03   INR 1.32 Echocardiogram and carotid dopplers pending this admission. On chest xray appears to show leads attached and aortic atherosclerosis.   She is receiving Norvasc 10 mg daily, Metoprolol succinate 100 mg BID and Coumadin.    Past Medical History:  Diagnosis Date  . Cataract   . CKD (chronic kidney disease) stage 3, GFR 30-59 ml/min    due to DM  . Dizziness   . Hyperlipidemia   . Hypertensive cardiovascular disease   . Paroxysmal atrial fibrillation (HCC)   . Retinopathy   . Sick sinus syndrome ([place])   . Type II or unspecified type diabetes mellitus without mention of complication, not stated as uncontrolled     Past Surgical History:  Procedure Laterality Date  . DENTAL SURGERY    . PACEMAKER INSERTION  04/20/12   MDT Adapta L implanted by Dr Rayann Heman  . PERMANENT PACEMAKER INSERTION N/A 04/20/2012   Procedure: PERMANENT PACEMAKER INSERTION;  Surgeon: Thompson Grayer, MD;  Location: [facility] CATH LAB;  Service: Cardiovascular;  Laterality: N/A;     Inpatient Medications: Scheduled Meds: . amLODipine  10 mg Oral Daily  . enoxaparin (LOVENOX) injection  30 mg Subcutaneous QHS  . insulin aspart  0-15 Units Subcutaneous TID WC  . insulin aspart  0-5  Units Subcutaneous QHS  . metoprolol succinate  100 mg Oral BID  . sodium chloride flush  3 mL Intravenous Q12H  . warfarin  5 mg Oral ONCE-1800  . Warfarin - Pharmacist Dosing Inpatient   Does not apply q1800   Continuous Infusions: . sodium chloride 75 mL/hr at 04/17/17 1040   PRN Meds:   Allergies:   No Known Allergies  Social History:   Social History   Social History  . Marital status: Single    Spouse name: N/A  . Number of children: N/A  . Years of  education: N/A   Occupational History  . Not on file.   Social History Main Topics  . Smoking status: Never Smoker  . Smokeless tobacco: Never Used  . Alcohol use No  . Drug use: No  . Sexual activity: Not on file   Other Topics Concern  . Not on file   Social History Narrative  . No narrative on file    Family History:   The patient's family history includes Cancer in her other; Heart attack in her father; Hypertension in her mother.  ROS:  Please see the history of present illness.  All other ROS reviewed and negative.     Physical Exam/Data:   Vitals:   04/16/17 2227 04/17/17 0426 04/17/17 0435 04/17/17 0945  BP: (!) 190/84 (!) 130/59 (!) 130/59 (!) 127/49  Pulse: 88 68 66 77  Resp: 20 18 18 18   Temp: 98 F (36.7 C) 97.8 F (36.6 C) 97.8 F (36.6 C) 97.5 F (36.4 C)  TempSrc: Oral Oral Oral Oral  SpO2: 98% 95% 95% 98%  Weight: 141 lb 14.4 oz (64.4 kg)  133 lb 9.6 oz (60.6 kg)   Height: 5\' 2"  (1.575 m)       Intake/Output Summary (Last 24 hours) at 04/17/17 1326 Last data filed at 04/17/17 0800  Gross per 24 hour  Intake              585 ml  Output                0 ml  Net              585 ml   Filed Weights   04/16/17 1522 04/16/17 2227 04/17/17 0435  Weight: 141 lb (64 kg) 141 lb 14.4 oz (64.4 kg) 133 lb 9.6 oz (60.6 kg)   Body mass index is 24.44 kg/m.  General: Well developed, well nourished, in no acute distress. Head: Normocephalic, atraumatic, sclera non-icteric, no xanthomas, nares are without discharge. Left eye surgically absent  Neck: Negative for carotid bruits. JVD not elevated. Lungs: Clear bilaterally to auscultation without wheezes, rales, or rhonchi. Breathing is unlabored. Heart: RRR with S1 S2. No murmurs, rubs, or gallops appreciated. Abdomen: Soft, non-tender, non-distended with normoactive bowel sounds. No hepatomegaly. No rebound/guarding. No obvious abdominal masses. Msk:  Strength and tone appear normal for age. Extremities: No  clubbing or cyanosis. No edema.  Distal pedal pulses are 2+ and equal bilaterally. Neuro: Alert and oriented X 3. No facial asymmetry. No focal deficit. Moves all extremities spontaneously. Psych:  Responds to questions appropriately with a normal affect.  EKG:  The EKG was personally reviewed and demonstrates sinus tachycardia rate 118.  Relevant CV Studies: Echocardiogram pending this admission.    Laboratory Data:  Chemistry Recent Labs Lab 04/16/17 1530  NA 137  K 4.2  CL 104  CO2 22  GLUCOSE 143*  BUN 17  CREATININE 1.00  CALCIUM 9.8  GFRNONAA 48*  GFRAA 56*  ANIONGAP 11    Hematology Recent Labs Lab 04/16/17 1530  WBC 5.9  RBC 4.06  HGB 12.1  HCT 36.4  MCV 89.7  MCH 29.8  MCHC 33.2  RDW 12.7  PLT 266   Cardiac Enzymes Recent Labs Lab 04/16/17 2246 04/17/17 0430 04/17/17 1034  TROPONINI <0.03 <0.03 <0.03   Radiology/Studies:  Dg Chest 2 View  Result Date: 04/16/2017 CLINICAL DATA:  Syncope.  Cardiac arrhythmia EXAM: CHEST  2 VIEW COMPARISON:  September 20, 2014 FINDINGS: There is mild scarring in the left base. There is no edema or consolidation. The heart size and pulmonary vascularity are normal. Pacemaker leads are attached to the right atrium and right ventricle. No adenopathy. Bones are osteoporotic. There is aortic atherosclerosis. IMPRESSION: No edema or consolidation. Stable mild scarring left base. Stable cardiac silhouette. Aortic atherosclerosis. Electronically Signed   By: Lowella Grip III M.D.   On: 04/16/2017 16:30    Assessment and Plan:   1. Near syncope: Patient tells me that she had transient loss of vision to her right eye. Was completely aware of time, location and person during the 3-4 second episode. Was sitting down/not on floor and immediately called 9-1-1 herself after the episode. She had associated sensation of palpitations/fibrillation to her chest.  -- I think she needs TIA evaluation, was globally weak after wards but  today her strength is back to baseline;  recommend head CT or brain MRI (would need to call [employer] to see if PPM is MRI compatible) -- echo and carotid dopplers are pending -- PPM interrogation showed no arrhythmias or pauses -- neg troponins x 3 -- no clinical signs of dehydration  2. Paroxysmal atrial fibrillation supra therapeutic on coumadin: Is not compliant with her medications. Currently rate controlled and in sinus rhythm. -- cont Metoprolol 100 mg BID. --  INR 1.32 -- Continue Coumadin   3. Hypertension: On Norvasc 10 mg daily at home and Losartan 100 mg. Continue these.  BP ranges  190/84- 127/49  4. Hyperlipidemia: LDL 75  5. Sick sinus syndrome s/p permanenet pacemaker (2013) : Interrogated and no arrhythmias noted.  6. Non complaint with medications and appointments: She is having difficult time with transportation to appointments and understanding her medications.  Patients labs and vital signs will need to be watched closely as I am not sure how much of her home medications she is actually taking, may need to adjust dosages.  Kristopher Glee, PA-C  04/17/2017 1:26 PM

## 2017-04-18 ENCOUNTER — Inpatient Hospital Stay (HOSPITAL_COMMUNITY): Payer: Medicare Other

## 2017-04-18 DIAGNOSIS — R55 Syncope and collapse: Secondary | ICD-10-CM

## 2017-04-18 DIAGNOSIS — I482 Chronic atrial fibrillation: Secondary | ICD-10-CM

## 2017-04-18 DIAGNOSIS — N183 Chronic kidney disease, stage 3 (moderate): Secondary | ICD-10-CM

## 2017-04-18 DIAGNOSIS — I6521 Occlusion and stenosis of right carotid artery: Secondary | ICD-10-CM

## 2017-04-18 LAB — CBC
HCT: 31.4 % — ABNORMAL LOW (ref 36.0–46.0)
Hemoglobin: 10 g/dL — ABNORMAL LOW (ref 12.0–15.0)
MCH: 28.8 pg (ref 26.0–34.0)
MCHC: 31.8 g/dL (ref 30.0–36.0)
MCV: 90.5 fL (ref 78.0–100.0)
PLATELETS: 259 10*3/uL (ref 150–400)
RBC: 3.47 MIL/uL — AB (ref 3.87–5.11)
RDW: 13 % (ref 11.5–15.5)
WBC: 6.2 10*3/uL (ref 4.0–10.5)

## 2017-04-18 LAB — PROTIME-INR
INR: 1.62
Prothrombin Time: 19.4 seconds — ABNORMAL HIGH (ref 11.4–15.2)

## 2017-04-18 LAB — GLUCOSE, CAPILLARY
GLUCOSE-CAPILLARY: 130 mg/dL — AB (ref 65–99)
GLUCOSE-CAPILLARY: 138 mg/dL — AB (ref 65–99)
GLUCOSE-CAPILLARY: 162 mg/dL — AB (ref 65–99)
Glucose-Capillary: 130 mg/dL — ABNORMAL HIGH (ref 65–99)
Glucose-Capillary: 151 mg/dL — ABNORMAL HIGH (ref 65–99)

## 2017-04-18 MED ORDER — ASPIRIN 81 MG PO CHEW
81.0000 mg | CHEWABLE_TABLET | Freq: Every day | ORAL | Status: DC
  Administered 2017-04-18 – 2017-04-19 (×2): 81 mg via ORAL
  Filled 2017-04-18 (×2): qty 1

## 2017-04-18 MED ORDER — WARFARIN SODIUM 5 MG PO TABS
5.0000 mg | ORAL_TABLET | Freq: Once | ORAL | Status: AC
  Administered 2017-04-18: 5 mg via ORAL
  Filled 2017-04-18: qty 1

## 2017-04-18 NOTE — Consult Note (Signed)
Hospital Consult    Reason for Consult:  Carotid artery stenosis Requesting Physician:  Wendee Beavers MRN #:  [id number]  History of Present Illness: This is a 83 y.o. female who presented to the ER a couple days ago b/c she sat down and thinks she lost consciousness for 3-4 seconds. She was holding her phone at that time time and when she "came to" she was still holding her phone.  She felt weak overall and scared and called 911.  She denies any chest pain or shortness of breath. She denies having dizziness or prior history of dizziness.   When she arrived at the ER, she was noted to have tachycardia and her heart rate had gotten as high as 175 with Afib with RVR.  She was given a dose of metoprolol and she converted back to NSR.  She does have a Medtronic PPM, which was interrogated and revealed a sinus arrhythmia.  During her hospitalization, she had a carotid duplex, which revealed an 80-99% right carotid artery stenosis and vascular surgery is consulted.   She has retinopathy. Her right eye vision has gotten progressively worse over the years. 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 She denies any episodes of amaurosis fugax, sudden weakness or numbness of the extremities, receptive or expressive aphasia.   She has a hx of diabetes and takes Metformin.  She takes a CCB and ARB for hypertension.   She has been placed on a beta blocker here in the hospital.  She is on coumadin for Afib.  She has hx of CKD 3.  She denies every having chest pain. She has no history of TIA or stroke. She has never been a smoker.   She lives alone in an apartment and has been preparing her meals daily.    Past Medical History:  Diagnosis Date  . Cataract   . CKD (chronic kidney disease) stage 3, GFR 30-59 ml/min    due to DM  . Dizziness   . Hyperlipidemia   . Hypertensive cardiovascular disease   . Paroxysmal atrial fibrillation (HCC)   . Retinopathy   . Sick sinus syndrome ([place])    . Type II or unspecified type diabetes mellitus without mention of complication, not stated as uncontrolled     Past Surgical History:  Procedure Laterality Date  . DENTAL SURGERY    . PACEMAKER INSERTION  04/20/12   MDT Adapta L implanted by Dr Rayann Heman  . PERMANENT PACEMAKER INSERTION N/A 04/20/2012   Procedure: PERMANENT PACEMAKER INSERTION;  Surgeon: Thompson Grayer, MD;  Location: [facility] CATH LAB;  Service: Cardiovascular;  Laterality: N/A;    No Known Allergies  Prior to Admission medications   Medication Sig Start Date End Date Taking? Authorizing Provider  amLODipine (NORVASC) 10 MG tablet TAKE ONE (1) TABLET BY MOUTH EVERY DAY (OVERDUE FOR AN APPTMT. CALL TO SCHEDULE) 03/26/17  Yes Allred, Jeneen Rinks, MD  b complex vitamins tablet Take 1 tablet by mouth daily.   Yes [provider]  Cholecalciferol (VITAMIN D PO) Take 1 tablet by mouth.   Yes [provider]  Coenzyme Q10 (CO Q 10) 100 MG CAPS Take 100 mg by mouth daily.    Yes [provider]  losartan (COZAAR) 100 MG tablet TAKE ONE (1) TABLET BY MOUTH EVERY DAY *MUST MAKE APPOINTMENT FOR FURTHER REFILLS* 03/26/17  Yes Vicie Mutters, PA-C  Magnesium 250 MG TABS Take 250 mg by mouth 2 (two) times daily.  Yes [provider]  metFORMIN (GLUCOPHAGE) 500 MG tablet TAKE 2 TABLETS(1000 MG) BY MOUTH TWICE DAILY 09/18/16  Yes Vicie Mutters, PA-C  metoprolol (TOPROL-XL) 200 MG 24 hr tablet TAKE ONE (1) TABLET BY MOUTH EVERY DAY Patient taking differently: TAKE HALF (0.5) A TABLET BY MOUTH TWICE A DAY 02/17/17  Yes Unk Pinto, MD  nitroGLYCERIN (NITROSTAT) 0.4 MG SL tablet Place 1 tablet (0.4 mg total) under the tongue every 5 (five) minutes as needed for chest pain. 05/08/16 05/08/17 Yes Vicie Mutters, PA-C  warfarin (COUMADIN) 5 MG tablet TAKE AS DIRECTED BY COUMADIN CLINIC 02/04/17  Yes Forcucci, Courtney, PA-C  warfarin (COUMADIN) 5 MG tablet Take 2.5-5 mg by mouth daily. Per blood work last week. Pt takes  1 tab (5 mg) on Monday, Wednesday, Friday and 0.5 tab (2.5 mg) on Tuesday, Thursday    Yes [provider]    Social History   Social History  . Marital status: Single    Spouse name: N/A  . Number of children: N/A  . Years of education: N/A   Occupational History  . Not on file.   Social History Main Topics  . Smoking status: Never Smoker  . Smokeless tobacco: Never Used  . Alcohol use No  . Drug use: No  . Sexual activity: Not on file   Other Topics Concern  . Not on file   Social History Narrative  . No narrative on file     Family History  Problem Relation Age of Onset  . Heart attack Father   . Hypertension Mother   . Cancer Other     ROS: [x]  Positive   [ ]  Negative   [ ]  All sytems reviewed and are negative Cardiac: []  chest pain/pressure []  palpitations []  SOB lying flat []  DOE [x]  hx SSS with PPM [x]  PAF [x]  syncope  Vascular: []  pain in legs while walking []  pain in legs at rest []  pain in legs at night []  non-healing ulcers []  hx of DVT []  swelling in legs  Pulmonary: []  productive cough []  asthma/wheezing []  home O2  Neurologic: []  weakness in []  arms []  legs []  numbness in []  arms []  legs []  hx of CVA []  mini stroke [] difficulty speaking or slurred speech []  temporary loss of vision in one eye []    HEENT: [x]  left eye enucleation d/t BCC  Hematologic: []  hx of cancer []  bleeding problems []  problems with blood clotting easily  Endocrine:   [x]  diabetes []  thyroid disease  GI []  vomiting blood []  blood in stool  GU: [x]  CKD/renal failure []  HD--[]  M/W/F or []  T/T/S []  burning with urination []  blood in urine  Psychiatric: []  anxiety []  depression  Musculoskeletal: []  arthritis []  joint pain  Integumentary: []  rashes []  ulcers  Constitutional: []  fever []  chills   Physical Examination  Vitals:   04/18/17 0404 04/18/17 0941  BP: (!) 145/57 (!) 154/69  Pulse: 66 72  Resp: 18   Temp: 97.9 F  (36.6 C) 98.1 F (36.7 C)   Body mass index is 25.61 kg/m.  General:  WDWN in NAD Gait: Not observed HENT: WNL, normocephalic, left eye surgically absent  Pulmonary: normal non-labored breathing, without Rales, rhonchi,  wheezing Cardiac: regular, without  Murmurs, rubs or gallops; without carotid bruits Skin: without rashes Vascular Exam/Pulses: Weak radial pulses. Extremities: without ischemic changes, without Gangrene , without cellulitis; without open wounds;  Musculoskeletal: no muscle wasting or atrophy  Neurologic: A&O X 3;  No focal weakness  or paresthesias are detected; speech is fluent/normal Psychiatric:  The pt has Normal affect.  CBC    Component Value Date/Time   WBC 6.2 04/18/2017 0330   RBC 3.47 (L) 04/18/2017 0330   HGB 10.0 (L) 04/18/2017 0330   HCT 31.4 (L) 04/18/2017 0330   PLT 259 04/18/2017 0330   MCV 90.5 04/18/2017 0330   MCH 28.8 04/18/2017 0330   MCHC 31.8 04/18/2017 0330   RDW 13.0 04/18/2017 0330   LYMPHSABS 1,980 04/09/2017 1226   MONOABS 420 04/09/2017 1226   EOSABS 240 04/09/2017 1226   BASOSABS 60 04/09/2017 1226    BMET    Component Value Date/Time   NA 137 04/16/2017 1530   K 4.2 04/16/2017 1530   CL 104 04/16/2017 1530   CO2 22 04/16/2017 1530   GLUCOSE 143 (H) 04/16/2017 1530   BUN 17 04/16/2017 1530   CREATININE 1.00 04/16/2017 1530   CREATININE 1.06 (H) 04/09/2017 1226   CALCIUM 9.8 04/16/2017 1530   GFRNONAA 48 (L) 04/16/2017 1530   GFRNONAA 47 (L) 04/09/2017 1226   GFRAA 56 (L) 04/16/2017 1530   GFRAA 54 (L) 04/09/2017 1226    COAGS: Lab Results  Component Value Date   INR 1.62 04/18/2017   INR 1.32 04/17/2017   INR 1.26 04/16/2017     Non-Invasive Vascular Imaging:   Carotid Duplex (Doppler) 04/18/17:  Preliminary findings: Right 80-99% ICA stenosis. Left 1-39% ICA stenosis. Antegrade vertebral flow.  CT Head without contrast 04/17/17: IMPRESSION: 1. Chronic microvascular ischemia findings without  acute intracranial abnormality. 2. Remote left eye enucleation  2D Echo pending   Statin:  No. Beta Blocker:  Yes.   Aspirin:  No. ACEI:  No. ARB:  Yes.   CCB use:  Yes Other antiplatelets/anticoagulants:  Yes.   coumadin    ASSESSMENT/PLAN: This is a 83 y.o. female with right carotid artery stenosis  -Carotid duplex reveals 80-99% stenosis of the right ICA.  No TIA or stroke symptoms.  -Pt is not currently on aspirin (on coumadin) -pt is on coumadin for Afib with INR today of 1.6 -hx SSS with Medtronic PPM -patient is very hesitant towards having a procedure as she lives alone and does not have a lot of family around. -Dr. Trula Slade to see patient this afternoon.   Virgina Jock, PA-C [facility] [phone]   I agree with the above.  I have seen and examined the patient.  I believe her near syncopal event was not related to her carotid stenosis, and that this was an incidental finding.   We discussed the treatment options of medical management vs surgical endarterectomy.  I would not recommend stenting due to her age.  She would like to proceed with right CEA.  I would like for her to be discharged when appropriate and recover more from her event, with plans for right CEA in 2-3 weeks.  We discussed the details of the surgery including but not limited to the risk of stroke, nerve injury, cardio-pulmonary complications, and bleeding.  I would like to make sure cardiology clears her for surgery.  We will stop her coumadin 5 days prior to surgery.  She needs to start an ASA 81mg .  [name]

## 2017-04-18 NOTE — Progress Notes (Addendum)
*  PRELIMINARY RESULTS* Vascular Ultrasound Carotid Duplex (Doppler) has been completed.  Preliminary findings: Right 80-99% ICA stenosis. Left 1-39% ICA stenosis. Antegrade vertebral flow.    Text paged Dr. Wendee Beavers with results.   Landry Mellow, RDMS, RVT  04/18/2017, 10:05 AM

## 2017-04-18 NOTE — Progress Notes (Signed)
ANTICOAGULATION CONSULT NOTE - Follow Up Consult  Pharmacy Consult for Coumadin Indication: atrial fibrillation  No Known Allergies  Patient Measurements: Height: 5\' 2"  (157.5 cm) Weight: 140 lb (63.5 kg) IBW/kg (Calculated) : 50.1 Heparin Dosing Weight:   Vital Signs: Temp: 98 F (36.7 C) (06/08 1213) Temp Source: Oral (06/08 1213) BP: 124/63 (06/08 1213) Pulse Rate: 66 (06/08 1213)  Labs:  Recent Labs  04/16/17 1530 04/16/17 2246 04/17/17 0430 04/17/17 1034 04/18/17 0330 04/18/17 1010  HGB 12.1  --   --   --  10.0*  --   HCT 36.4  --   --   --  31.4*  --   PLT 266  --   --   --  259  --   LABPROT 15.9*  --  16.5*  --   --  19.4*  INR 1.26  --  1.32  --   --  1.62  CREATININE 1.00  --   --   --   --   --   TROPONINI  --  <0.03 <0.03 <0.03  --   --     Estimated Creatinine Clearance: 33.4 mL/min (by C-G formula based on SCr of 1 mg/dL).   Assessment:  Anticoag: Warfarin PTA for afib. + LVX for DVT Px - Questionable PTA warfarin dose at home -admit INR subtherapeutic 1.26. Hgb 12.1>10 today. Plts stable. INR up to 1.62 today. - Home regimen: warfarin 5mg  MWF, 2.5mg  all other days  Goal of Therapy:  INR 2-3 Monitor platelets by anticoagulation protocol: Yes   Plan:  Repeat Coumadin 5mg  po x 1 today Daily INR   Kylie Sanchez, PharmD, BCPS Clinical Staff Pharmacist Pager [phone]  Kylie Sanchez 04/18/2017,12:22 PM

## 2017-04-18 NOTE — Progress Notes (Signed)
PROGRESS NOTE    Kylie Sanchez  EYC:[id number] DOB: [birth year] DOA: 04/16/2017 PCP: Unk Pinto, MD    Brief Narrative:  83 y.o. female with medical history significant of a.fib on chronic anticoagulation, HTN  HLD, Pre-diabetes, Left-eye enucleation due  to basal cell carcinoma, SSS sp pacemaker, CKD stage 3  Presented with [facility] [employer] happening today patient was cooking herself food in AM and turned  walked to the table to sit down to read a book . When she was sitting down she suddenly passed out She thinks that she completely lost consciousness for 3-4 seconds   Assessment & Plan:    Syncope - Could be secondary to uncontrolled atrial fibrillation initially. But work up showing stricture of Right carotid artery. Vascular surgery consulted. - PT eval - Echo pending.  Visual disturbance - Most likely related to syncopal episode which could've been related to transient uncontrolled atrial fibrillation. Has resolved and patient has not shown any focal deficits.   Active Problems:    Long term current use of anticoagulant therapy - Pharmacy managing Coumadin, INR subtherapeutic.    Atrial fibrillation /Tachycardia-bradycardia (HCC)/  Sick sinus syndrome [facility]) - Cardiology on board and assisting - Pharmacy managing Coumadin    Diabetes mellitus ([place]) - Continue sliding scale insulin - Carb modified diet   DVT prophylaxis: On Coumadin Code Status: DO NOT RESUSCITATE Family Communication: Discussed directly with patient Disposition Plan: Pending work up and further recommendations by specialist.   Consultants:   Cardiology  Vascular surgery   Procedures: Echocardiogram/carotid Dopplers   Antimicrobials: None   Subjective: The patient has no new complaints.   Objective: Vitals:   04/17/17 2051 04/18/17 0404 04/18/17 0941 04/18/17 1213  BP: 132/62 (!) 145/57 (!) 154/69 124/63  Pulse: 76 66 72 66  Resp: 18 18    Temp: 98.3 F (36.8 C) 97.9 F (36.6 C) 98.1  F (36.7 C) 98 F (36.7 C)  TempSrc: Oral Oral Oral Oral  SpO2: 96% 96% 97% 100%  Weight:  63.5 kg (140 lb)    Height:        Intake/Output Summary (Last 24 hours) at 04/18/17 1417 Last data filed at 04/18/17 0830  Gross per 24 hour  Intake              240 ml  Output              300 ml  Net              -60 ml   Filed Weights   04/16/17 2227 04/17/17 0435 04/18/17 0404  Weight: 64.4 kg (141 lb 14.4 oz) 60.6 kg (133 lb 9.6 oz) 63.5 kg (140 lb)    Examination:  General exam: Appears calm and comfortable , In no acute distress Respiratory system: Clear to auscultation. Respiratory effort normal. Cardiovascular system: IRRR. No JVD Gastrointestinal system: Abdomen is nondistended, soft and nontender. No organomegaly or masses felt. Normal bowel sounds heard. Central nervous system: Alert and oriented. No focal neurological deficits. Extremities: Symmetric 5 x 5 power. Skin: No rashes, lesions or ulcers, on limited exam. Psychiatry:Mood & affect appropriate.    Data Reviewed: I have personally reviewed following labs and imaging studies  CBC:  Recent Labs Lab 04/16/17 1530 04/18/17 0330  WBC 5.9 6.2  HGB 12.1 10.0*  HCT 36.4 31.4*  MCV 89.7 90.5  PLT 266 149   Basic Metabolic Panel:  Recent Labs Lab 04/16/17 1530  NA 137  K 4.2  CL 104  CO2 22  GLUCOSE 143*  BUN 17  CREATININE 1.00  CALCIUM 9.8   GFR: Estimated Creatinine Clearance: 33.4 mL/min (by C-G formula based on SCr of 1 mg/dL). Liver Function Tests: No results for input(s): AST, ALT, ALKPHOS, BILITOT, PROT, ALBUMIN in the last 168 hours. No results for input(s): LIPASE, AMYLASE in the last 168 hours. No results for input(s): AMMONIA in the last 168 hours. Coagulation Profile:  Recent Labs Lab 04/16/17 1530 04/17/17 0430 04/18/17 1010  INR 1.26 1.32 1.62   Cardiac Enzymes:  Recent Labs Lab 04/16/17 2246 04/17/17 0430 04/17/17 1034  TROPONINI <0.03 <0.03 <0.03   BNP (last 3  results) No results for input(s): PROBNP in the last 8760 hours. HbA1C: No results for input(s): HGBA1C in the last 72 hours. CBG:  Recent Labs Lab 04/17/17 1203 04/17/17 1619 04/17/17 2056 04/18/17 0610 04/18/17 1141  GLUCAP 154* 152* 130* 130* 151*   Lipid Profile: No results for input(s): CHOL, HDL, LDLCALC, TRIG, CHOLHDL, LDLDIRECT in the last 72 hours. Thyroid Function Tests: No results for input(s): TSH, T4TOTAL, FREET4, T3FREE, THYROIDAB in the last 72 hours. Anemia Panel: No results for input(s): VITAMINB12, FOLATE, FERRITIN, TIBC, IRON, RETICCTPCT in the last 72 hours. Sepsis Labs: No results for input(s): PROCALCITON, LATICACIDVEN in the last 168 hours.  No results found for this or any previous visit (from the past 240 hour(s)).       Radiology Studies: Dg Chest 2 View  Result Date: 04/16/2017 CLINICAL DATA:  Syncope.  Cardiac arrhythmia EXAM: CHEST  2 VIEW COMPARISON:  September 20, 2014 FINDINGS: There is mild scarring in the left base. There is no edema or consolidation. The heart size and pulmonary vascularity are normal. Pacemaker leads are attached to the right atrium and right ventricle. No adenopathy. Bones are osteoporotic. There is aortic atherosclerosis. IMPRESSION: No edema or consolidation. Stable mild scarring left base. Stable cardiac silhouette. Aortic atherosclerosis. Electronically Signed   By: Lowella Grip III M.D.   On: 04/16/2017 16:30   Ct Head Wo Contrast  Result Date: 04/17/2017 CLINICAL DATA:  Right eye vision loss EXAM: CT HEAD WITHOUT CONTRAST TECHNIQUE: Contiguous axial images were obtained from the base of the skull through the vertex without intravenous contrast. COMPARISON:  None. FINDINGS: Brain: No mass lesion, intraparenchymal hemorrhage or extra-axial collection. No evidence of acute cortical infarct. There is periventricular hypoattenuation compatible with chronic microvascular disease. Vascular: No hyperdense vessel or unexpected  calcification. Skull: Normal visualized skull base, calvarium and extracranial soft tissues. Sinuses/Orbits: No sinus fluid levels or advanced mucosal thickening. No mastoid effusion. Status post left enucleation. IMPRESSION: 1. Chronic microvascular ischemia findings without acute intracranial abnormality. 2. Remote left eye enucleation. Electronically Signed   By: Ulyses Jarred M.D.   On: 04/17/2017 18:33        Scheduled Meds: . amLODipine  10 mg Oral Daily  . enoxaparin (LOVENOX) injection  30 mg Subcutaneous QHS  . insulin aspart  0-15 Units Subcutaneous TID WC  . insulin aspart  0-5 Units Subcutaneous QHS  . metoprolol succinate  100 mg Oral BID  . sodium chloride flush  3 mL Intravenous Q12H  . warfarin  5 mg Oral ONCE-1800  . Warfarin - Pharmacist Dosing Inpatient   Does not apply q1800   Continuous Infusions: . sodium chloride Stopped (04/17/17 1454)     LOS: 1 day    Time spent: > 35 minutes  Velvet Bathe, MD [facility] Pager [phone]  If 7PM-7AM, please contact night-coverage [email or web address]  Password [email or web address] 04/18/2017, 2:17 PM

## 2017-04-18 NOTE — Progress Notes (Signed)
Progress Note  Patient Name: Kylie Sanchez Date of Encounter: 04/18/2017  Primary Cardiologist: Dr. Rayann Heman  Subjective   Neg Head CT, feeling well today. Echo and carotid dopplers pending. Patient says Dr. Arita Miss came by to see her and was told she has 80% stenosis to her right carotid and needs a procedure in approx 3 weeks as outpatient.  Inpatient Medications    Scheduled Meds: . amLODipine  10 mg Oral Daily  . enoxaparin (LOVENOX) injection  30 mg Subcutaneous QHS  . insulin aspart  0-15 Units Subcutaneous TID WC  . insulin aspart  0-5 Units Subcutaneous QHS  . metoprolol succinate  100 mg Oral BID  . sodium chloride flush  3 mL Intravenous Q12H  . warfarin  5 mg Oral ONCE-1800  . Warfarin - Pharmacist Dosing Inpatient   Does not apply q1800   Continuous Infusions: . sodium chloride Stopped (04/17/17 1454)   PRN Meds:    Vital Signs    Vitals:   04/17/17 2051 04/18/17 0404 04/18/17 0941 04/18/17 1213  BP: 132/62 (!) 145/57 (!) 154/69 124/63  Pulse: 76 66 72 66  Resp: 18 18    Temp: 98.3 F (36.8 C) 97.9 F (36.6 C) 98.1 F (36.7 C) 98 F (36.7 C)  TempSrc: Oral Oral Oral Oral  SpO2: 96% 96% 97% 100%  Weight:  140 lb (63.5 kg)    Height:        Intake/Output Summary (Last 24 hours) at 04/18/17 1519 Last data filed at 04/18/17 0830  Gross per 24 hour  Intake              240 ml  Output              300 ml  Net              -60 ml   Filed Weights   04/16/17 2227 04/17/17 0435 04/18/17 0404  Weight: 141 lb 14.4 oz (64.4 kg) 133 lb 9.6 oz (60.6 kg) 140 lb (63.5 kg)    Telemetry    Paroxysmal afib with occasional PVC- Personally Reviewed   Physical Exam   GEN: Well nourished, well developed HEENT: normal  Neck: no JVD, carotid bruits, or masses Cardiac: irregular irregular. no murmurs, rubs, or gallops,no edema. Intact distal pulses bilaterally.  Respiratory: clear to auscultation bilaterally, normal work of breathing GI: soft, nontender,  nondistended, + BS MS: no deformity or atrophy, mild le edema Skin: warm and dry, no rash Neuro: Alert and Oriented x 3, Strength and sensation are intact Psych:   Full affect  Labs    Chemistry Recent Labs Lab 04/16/17 1530  NA 137  K 4.2  CL 104  CO2 22  GLUCOSE 143*  BUN 17  CREATININE 1.00  CALCIUM 9.8  GFRNONAA 48*  GFRAA 56*  ANIONGAP 11     Hematology Recent Labs Lab 04/16/17 1530 04/18/17 0330  WBC 5.9 6.2  RBC 4.06 3.47*  HGB 12.1 10.0*  HCT 36.4 31.4*  MCV 89.7 90.5  MCH 29.8 28.8  MCHC 33.2 31.8  RDW 12.7 13.0  PLT 266 259    Cardiac Enzymes Recent Labs Lab 04/16/17 2246 04/17/17 0430 04/17/17 1034  TROPONINI <0.03 <0.03 <0.03     Radiology    Dg Chest 2 View  Result Date: 04/16/2017 CLINICAL DATA:  Syncope.  Cardiac arrhythmia EXAM: CHEST  2 VIEW COMPARISON:  September 20, 2014 FINDINGS: There is mild scarring in the left base. There is no edema or  consolidation. The heart size and pulmonary vascularity are normal. Pacemaker leads are attached to the right atrium and right ventricle. No adenopathy. Bones are osteoporotic. There is aortic atherosclerosis. IMPRESSION: No edema or consolidation. Stable mild scarring left base. Stable cardiac silhouette. Aortic atherosclerosis. Electronically Signed   By: Lowella Grip III M.D.   On: 04/16/2017 16:30   Ct Head Wo Contrast  Result Date: 04/17/2017 CLINICAL DATA:  Right eye vision loss EXAM: CT HEAD WITHOUT CONTRAST TECHNIQUE: Contiguous axial images were obtained from the base of the skull through the vertex without intravenous contrast. COMPARISON:  None. FINDINGS: Brain: No mass lesion, intraparenchymal hemorrhage or extra-axial collection. No evidence of acute cortical infarct. There is periventricular hypoattenuation compatible with chronic microvascular disease. Vascular: No hyperdense vessel or unexpected calcification. Skull: Normal visualized skull base, calvarium and extracranial soft  tissues. Sinuses/Orbits: No sinus fluid levels or advanced mucosal thickening. No mastoid effusion. Status post left enucleation. IMPRESSION: 1. Chronic microvascular ischemia findings without acute intracranial abnormality. 2. Remote left eye enucleation. Electronically Signed   By: Ulyses Jarred M.D.   On: 04/17/2017 18:33    Cardiac Studies   Echocardiogram pending   Patient Profile     Kylie Sanchez is a  83 y.o. female DNR with a hx of cataract, CKD stage 3, hyperlipidemia, hypertensive, paroxysmal atrial fibrillation (on Coumadin), sick sinus syndrome s/p permanenet pacemaker 2013 (medtronic), Type II diabetes, left-eye enucleation due to basal cell carcinoma. who is being seen  for the evaluation of syncope and atrial fibrillation at the request of Dr. Wendee Beavers.   Assessment & Plan    Blood Pressure: 124/63           Hr: 60-70s   1. Near syncope:  --  Negative head CT, normal orthostatics -- Echo and carotid dopplers pending  2. Paroxysmal atrial fibrillation supra therapeutic on coumadin: Is not compliant with her medications at home. Currently rate controlled and in sinus rhythm. -- cont Metoprolol 100 mg BID. --  INR 1.62 today -- still subtherapeutic, -- Continue Coumadin   3. Hypertension: On Norvasc 10 mg daily at home and Losartan 100 mg. Continue these.  BP ranges  190/84- 127/49  4. Hyperlipidemia: LDL 75  5. Sick sinus syndrome s/p permanenet pacemaker (2013) : Interrogated and reportedly no arrhythmias noted.  6. Non complaint with medications and appointments: She is having difficult time with transportation to appointments and understanding her medications.  Still waiting for echo and carotid doppler studies to result for further recommendations.    Kristopher Glee, PA-C  04/18/2017, 3:19 PM

## 2017-04-19 DIAGNOSIS — Z7901 Long term (current) use of anticoagulants: Secondary | ICD-10-CM

## 2017-04-19 DIAGNOSIS — I4891 Unspecified atrial fibrillation: Secondary | ICD-10-CM

## 2017-04-19 LAB — CBC
HEMATOCRIT: 33.3 % — AB (ref 36.0–46.0)
HEMOGLOBIN: 10.8 g/dL — AB (ref 12.0–15.0)
MCH: 29.3 pg (ref 26.0–34.0)
MCHC: 32.4 g/dL (ref 30.0–36.0)
MCV: 90.2 fL (ref 78.0–100.0)
PLATELETS: 251 10*3/uL (ref 150–400)
RBC: 3.69 MIL/uL — AB (ref 3.87–5.11)
RDW: 12.8 % (ref 11.5–15.5)
WBC: 5.6 10*3/uL (ref 4.0–10.5)

## 2017-04-19 LAB — PROTIME-INR
INR: 1.79
Prothrombin Time: 21 seconds — ABNORMAL HIGH (ref 11.4–15.2)

## 2017-04-19 LAB — GLUCOSE, CAPILLARY
GLUCOSE-CAPILLARY: 203 mg/dL — AB (ref 65–99)
Glucose-Capillary: 150 mg/dL — ABNORMAL HIGH (ref 65–99)

## 2017-04-19 MED ORDER — METOPROLOL SUCCINATE ER 100 MG PO TB24
100.0000 mg | ORAL_TABLET | Freq: Two times a day (BID) | ORAL | 0 refills | Status: DC
Start: 2017-04-19 — End: 2017-04-22

## 2017-04-19 MED ORDER — WARFARIN SODIUM 5 MG PO TABS: 5.0000 mg | ORAL_TABLET | Freq: Once | ORAL | Status: DC

## 2017-04-19 NOTE — Progress Notes (Signed)
Progress Note  Patient Name: Kylie Sanchez Date of Encounter: 04/19/2017  Primary Cardiologist: Dr. Rayann Heman  Subjective   Wants to go home Indicated DR Trula Slade would do surgery on her carotid in 3 or so weeks Not clear why echo not read yet but report locked I reviewed images   Inpatient Medications    Scheduled Meds: . amLODipine  10 mg Oral Daily  . aspirin  81 mg Oral Daily  . enoxaparin (LOVENOX) injection  30 mg Subcutaneous QHS  . insulin aspart  0-15 Units Subcutaneous TID WC  . insulin aspart  0-5 Units Subcutaneous QHS  . metoprolol succinate  100 mg Oral BID  . sodium chloride flush  3 mL Intravenous Q12H  . Warfarin - Pharmacist Dosing Inpatient   Does not apply q1800   Continuous Infusions: . sodium chloride Stopped (04/17/17 1454)   PRN Meds:    Vital Signs    Vitals:   04/18/17 1213 04/18/17 2002 04/19/17 0507 04/19/17 0944  BP: 124/63 (!) 120/54 (!) 143/60 (!) 116/55  Pulse: 66 70 64 75  Resp:  19 18   Temp: 98 F (36.7 C) 97.7 F (36.5 C) 98.1 F (36.7 C)   TempSrc: Oral Oral Oral   SpO2: 100% 96% 97%   Weight:   140 lb 1.6 oz (63.5 kg)   Height:        Intake/Output Summary (Last 24 hours) at 04/19/17 1010 Last data filed at 04/19/17 0711  Gross per 24 hour  Intake              600 ml  Output              950 ml  Net             -350 ml   Filed Weights   04/17/17 0435 04/18/17 0404 04/19/17 0507  Weight: 133 lb 9.6 oz (60.6 kg) 140 lb (63.5 kg) 140 lb 1.6 oz (63.5 kg)    Telemetry    Paroxysmal afib with occasional PVC- Personally Reviewed   Physical Exam   Affect appropriate Pale elderly female  HEENT: left eye enucleation  Neck supple with no adenopathy JVP normal no bruits no thyromegaly Lungs clear with no wheezing and good diaphragmatic motion Heart:  S1/S2 no murmur, no rub, gallop or click PMI normal Abdomen: benighn, BS positve, no tenderness, no AAA no bruit.  No HSM or HJR Distal pulses intact with no bruits No  edema Neuro non-focal Skin warm and dry No muscular weakness   Labs    Chemistry  Recent Labs Lab 04/16/17 1530  NA 137  K 4.2  CL 104  CO2 22  GLUCOSE 143*  BUN 17  CREATININE 1.00  CALCIUM 9.8  GFRNONAA 48*  GFRAA 56*  ANIONGAP 11     Hematology  Recent Labs Lab 04/16/17 1530 04/18/17 0330 04/19/17 0320  WBC 5.9 6.2 5.6  RBC 4.06 3.47* 3.69*  HGB 12.1 10.0* 10.8*  HCT 36.4 31.4* 33.3*  MCV 89.7 90.5 90.2  MCH 29.8 28.8 29.3  MCHC 33.2 31.8 32.4  RDW 12.7 13.0 12.8  PLT 266 259 251    Cardiac Enzymes  Recent Labs Lab 04/16/17 2246 04/17/17 0430 04/17/17 1034  TROPONINI <0.03 <0.03 <0.03     Radiology    Ct Head Wo Contrast  Result Date: 04/17/2017 CLINICAL DATA:  Right eye vision loss EXAM: CT HEAD WITHOUT CONTRAST TECHNIQUE: Contiguous axial images were obtained from the base of the skull through  the vertex without intravenous contrast. COMPARISON:  None. FINDINGS: Brain: No mass lesion, intraparenchymal hemorrhage or extra-axial collection. No evidence of acute cortical infarct. There is periventricular hypoattenuation compatible with chronic microvascular disease. Vascular: No hyperdense vessel or unexpected calcification. Skull: Normal visualized skull base, calvarium and extracranial soft tissues. Sinuses/Orbits: No sinus fluid levels or advanced mucosal thickening. No mastoid effusion. Status post left enucleation. IMPRESSION: 1. Chronic microvascular ischemia findings without acute intracranial abnormality. 2. Remote left eye enucleation. Electronically Signed   By: Ulyses Jarred M.D.   On: 04/17/2017 18:33    Cardiac Studies   Echocardiogram pending   Patient Profile     Kylie Sanchez is a  83 y.o. female DNR with a hx of cataract, CKD stage 3, hyperlipidemia, hypertensive, paroxysmal atrial fibrillation (on Coumadin), sick sinus syndrome s/p permanenet pacemaker 2013 (medtronic), Type II diabetes, left-eye enucleation due to basal cell  carcinoma. who is being seen  for the evaluation of syncope and atrial fibrillation at the request of Dr. Wendee Beavers.   Assessment & Plan    Blood Pressure: 124/63           Hr: 60-70s   1. Near syncope:  --  Negative head CT, normal orthostatics -- Echo reviewed and normal EF no valve disease  2. Paroxysmal atrial fibrillation supra therapeutic on coumadin: Is not compliant with her medications at home. Currently rate controlled and in sinus rhythm. -- cont Metoprolol 100 mg BID. --  INR 1.79  today -- still subtherapeutic, -- Continue Coumadin   3. Hypertension: On Norvasc 10 mg daily at home and Losartan 100 mg. Continue these.  BP ranges  190/84- 127/49  4. Hyperlipidemia: LDL 75  5. Sick sinus syndrome s/p permanenet pacemaker (2013) : Interrogated and reportedly no arrhythmias noted.  6. Non complaint with medications and appointments: She is having difficult time with transportation to appointments and understanding her medications.  Ok to d/c home she is in NSR and can be Rx over next few days This is followed by her medical doctor's office not [place] F/U Dr Trula Slade to schedule carotid surgery    Signed, Jenkins Rouge, MD  04/19/2017, 10:10 AM

## 2017-04-19 NOTE — Progress Notes (Signed)
Patient given discharge instructions medication list and paper prescription. Pt with questions regarding coumadin dosing. Dr. Wendee Beavers called to clarify, he stated she is to take coumadin as she was prior to admission. Pt verbalized understanding.  IV and Tele were removed. Nelda Bucks, Bettina Gavia RN

## 2017-04-19 NOTE — Progress Notes (Signed)
Discussed d/c information specifically meds per Wendee Beavers order with family order.

## 2017-04-19 NOTE — Care Management Note (Addendum)
Case Management Note  Patient Details  Name: Tikita Mabee MRN: [id number] Date of Birth: July 24, 1925  Subjective/Objective:       Pt admitted with  A fib            Action/Plan:   PTA independent from home  - has "people that are around if I need them".  CM informed pt of recommended HH - pt is declining at this time, pt stated "I have had that before, and I don't need it now.   CM informed pt that she can request her PCP to order [facility] if determined she would benefit from it post discharge.  Pts brother will transport pt home.   Pt declined assistance with medications per consult.  Pt has PCP and denied barriers to obtaining/paying for prescribed medications   Expected Discharge Date:  04/19/17               Expected Discharge Plan:  Home/Self Care  In-House Referral:     Discharge planning Services  CM Consult  Post Acute Care Choice:    Choice offered to:     DME Arranged:    DME Agency:     HH Arranged:    [place] Agency:     Status of Service:  Completed, signed off  If discussed at [employer] of Stay Meetings, dates discussed:    Additional Comments:  Maryclare Labrador, RN 04/19/2017, 12:55 PM

## 2017-04-19 NOTE — Progress Notes (Signed)
ANTICOAGULATION CONSULT NOTE - Follow Up Consult  Pharmacy Consult for Coumadin Indication: atrial fibrillation  No Known Allergies  Patient Measurements: Height: 5\' 2"  (157.5 cm) Weight: 140 lb 1.6 oz (63.5 kg) IBW/kg (Calculated) : 50.1 Heparin Dosing Weight:   Vital Signs: Temp: 98.1 F (36.7 C) (06/09 0507) Temp Source: Oral (06/09 0507) BP: 116/55 (06/09 0944) Pulse Rate: 75 (06/09 0944)  Labs:  Recent Labs  04/16/17 1530 04/16/17 2246 04/17/17 0430 04/17/17 1034 04/18/17 0330 04/18/17 1010 04/19/17 0320  HGB 12.1  --   --   --  10.0*  --  10.8*  HCT 36.4  --   --   --  31.4*  --  33.3*  PLT 266  --   --   --  259  --  251  LABPROT 15.9*  --  16.5*  --   --  19.4* 21.0*  INR 1.26  --  1.32  --   --  1.62 1.79  CREATININE 1.00  --   --   --   --   --   --   TROPONINI  --  <0.03 <0.03 <0.03  --   --   --    Estimated Creatinine Clearance: 33.4 mL/min (by C-G formula based on SCr of 1 mg/dL).  Assessment:  Anticoag: Warfarin PTA for afib. + LVX for DVT Px - Questionable PTA warfarin dose at home -admit INR subtherapeutic 1.26. CBC stable, HgB 10.8, INR up to 1.79  - Home regimen: warfarin 5mg  MWF, 2.5mg  all other days  Goal of Therapy:  INR 2-3 Monitor platelets by anticoagulation protocol: Yes   Plan:  Warfarin 5mg  PO x1 tonight  Discontinue Lovenox when INR therapeutic Daily INR/CBC Monitor for s/sx of bleeding  Georga Bora, PharmD Clinical Pharmacist 04/19/2017 12:33 PM

## 2017-04-19 NOTE — Discharge Summary (Addendum)
Physician Discharge Summary  Tyriana Helmkamp TKZ:[id number] DOB: 11/02/1925 DOA: 04/16/2017  PCP: Unk Pinto, MD  Admit date: 04/16/2017 Discharge date: 04/19/2017  Time spent: > 35 minutes  Recommendations for Outpatient Follow-up:  1. Please be sure to monitor blood pressures. ARB held on d/c due to low normal BP's 2. Pt has had compliance issues with medications.  3. Reassess INR levels 4. Right carotid artery stenosis 80 percent. Vascular surgeon consulted. Patient to f/u with Dr. Trula Slade in 3 weeks   Discharge Diagnoses:  Active Problems:   Tachycardia-bradycardia [facility])   Long term current use of anticoagulant therapy   Sick sinus syndrome (HCC)   Atrial fibrillation [I48.91]   Diabetes mellitus ([place])   Syncope   DM (diabetes mellitus), type 2 ([place])   Discharge Condition: stable  Diet recommendation: Carb modified diet  Filed Weights   04/17/17 0435 04/18/17 0404 04/19/17 0507  Weight: 60.6 kg (133 lb 9.6 oz) 63.5 kg (140 lb) 63.5 kg (140 lb 1.6 oz)    History of present illness:  83 y.o.femalewith medical history significant of a.fib on chronic anticoagulation, HTN HLD, Pre-diabetes, Left-eye enucleation due to basal cell carcinoma, SSS sp pacemaker, CKD stage 3  Presented with [facility] [employer] happening today patient was cooking herself food in AM and turned walkedto the table to sit down to read a book . When she was sitting down she suddenly passed out She thinks that she completely lost consciousness for 3-4 seconds  Hospital Course:  Agree with Cardiology notes as listed below on their last evaluation: 1. Near syncope: --  Negative head CT, normal orthostatics -- Echo reviewed and normal EF no valve disease  2. Paroxysmal atrial fibrillation supra therapeutic on coumadin: Is not compliant with her medications at home. Currently rate controlled and in sinus rhythm. -- cont Metoprolol 100 mg BID. -- INR 1.79  today -- still subtherapeutic, -- Continue Coumadin    3. Hypertension: On Norvasc 10 mg daily at home and Losartan 100 mg. Continue these.  BP ranges 190/84- 127/49  4. Hyperlipidemia: LDL 75  5. Sick sinus syndrome s/p permanenet pacemaker (2013): Interrogated and reportedly no arrhythmias noted.  6. Non complaint with medications and appointments: She is having difficult time with transportation to appointments and understanding her medications.  Ok to d/c home she is in NSR and can be Rx over next few days This is followed by her medical doctor's office not [place] F/U Dr Trula Slade to schedule carotid surgery   Right carotid artery stenosis: 80 percent. Vascular surgeon consulted. Patient to f/u with Dr. Trula Slade in 3 weeks  Procedures:  none  Consultations:  Cardiology  Vascular surgery  Discharge Exam: Vitals:   04/19/17 0507 04/19/17 0944  BP: (!) 143/60 (!) 116/55  Pulse: 64 75  Resp: 18   Temp: 98.1 F (36.7 C)     General: Pt in nad, alert and awake Cardiovascular: rrr, no rubs Respiratory: no increased wob, no wheezes  Discharge Instructions   Discharge Instructions    Call MD for:  difficulty breathing, headache or visual disturbances    Complete by:  As directed    Call MD for:  redness, tenderness, or signs of infection (pain, swelling, redness, odor or green/yellow discharge around incision site)    Complete by:  As directed    Call MD for:  temperature >100.4    Complete by:  As directed    Diet - low sodium heart healthy    Complete by:  As directed  Increase activity slowly    Complete by:  As directed      Current Discharge Medication List    CONTINUE these medications which have CHANGED   Details  metoprolol succinate (TOPROL-XL) 100 MG 24 hr tablet Take 1 tablet (100 mg total) by mouth 2 (two) times daily. Take with or immediately following a meal. Qty: 60 tablet, Refills: 0      CONTINUE these medications which have NOT CHANGED   Details  amLODipine (NORVASC) 10 MG tablet TAKE ONE  (1) TABLET BY MOUTH EVERY DAY (OVERDUE FOR AN APPTMT. CALL TO SCHEDULE) Qty: 15 tablet, Refills: 0    b complex vitamins tablet Take 1 tablet by mouth daily.    Cholecalciferol (VITAMIN D PO) Take 1 tablet by mouth.    Coenzyme Q10 (CO Q 10) 100 MG CAPS Take 100 mg by mouth daily.     Magnesium 250 MG TABS Take 250 mg by mouth 2 (two) times daily.     metFORMIN (GLUCOPHAGE) 500 MG tablet TAKE 2 TABLETS(1000 MG) BY MOUTH TWICE DAILY Qty: 360 tablet, Refills: 1    nitroGLYCERIN (NITROSTAT) 0.4 MG SL tablet Place 1 tablet (0.4 mg total) under the tongue every 5 (five) minutes as needed for chest pain. Qty: 100 tablet, Refills: 3    warfarin (COUMADIN) 5 MG tablet Take 2.5-5 mg by mouth daily. Per blood work last week. Pt takes 1 tab (5 mg) on Monday, Wednesday, Friday and 0.5 tab (2.5 mg) on Tuesday, Thursday       STOP taking these medications     losartan (COZAAR) 100 MG tablet        No Known Allergies    The results of significant diagnostics from this hospitalization (including imaging, microbiology, ancillary and laboratory) are listed below for reference.    Significant Diagnostic Studies: Dg Chest 2 View  Result Date: 04/16/2017 CLINICAL DATA:  Syncope.  Cardiac arrhythmia EXAM: CHEST  2 VIEW COMPARISON:  September 20, 2014 FINDINGS: There is mild scarring in the left base. There is no edema or consolidation. The heart size and pulmonary vascularity are normal. Pacemaker leads are attached to the right atrium and right ventricle. No adenopathy. Bones are osteoporotic. There is aortic atherosclerosis. IMPRESSION: No edema or consolidation. Stable mild scarring left base. Stable cardiac silhouette. Aortic atherosclerosis. Electronically Signed   By: Lowella Grip III M.D.   On: 04/16/2017 16:30   Ct Head Wo Contrast  Result Date: 04/17/2017 CLINICAL DATA:  Right eye vision loss EXAM: CT HEAD WITHOUT CONTRAST TECHNIQUE: Contiguous axial images were obtained from the base of  the skull through the vertex without intravenous contrast. COMPARISON:  None. FINDINGS: Brain: No mass lesion, intraparenchymal hemorrhage or extra-axial collection. No evidence of acute cortical infarct. There is periventricular hypoattenuation compatible with chronic microvascular disease. Vascular: No hyperdense vessel or unexpected calcification. Skull: Normal visualized skull base, calvarium and extracranial soft tissues. Sinuses/Orbits: No sinus fluid levels or advanced mucosal thickening. No mastoid effusion. Status post left enucleation. IMPRESSION: 1. Chronic microvascular ischemia findings without acute intracranial abnormality. 2. Remote left eye enucleation. Electronically Signed   By: Ulyses Jarred M.D.   On: 04/17/2017 18:33    Microbiology: No results found for this or any previous visit (from the past 240 hour(s)).   Labs: Basic Metabolic Panel:  Recent Labs Lab 04/16/17 1530  NA 137  K 4.2  CL 104  CO2 22  GLUCOSE 143*  BUN 17  CREATININE 1.00  CALCIUM 9.8   Liver  Function Tests: No results for input(s): AST, ALT, ALKPHOS, BILITOT, PROT, ALBUMIN in the last 168 hours. No results for input(s): LIPASE, AMYLASE in the last 168 hours. No results for input(s): AMMONIA in the last 168 hours. CBC:  Recent Labs Lab 04/16/17 1530 04/18/17 0330 04/19/17 0320  WBC 5.9 6.2 5.6  HGB 12.1 10.0* 10.8*  HCT 36.4 31.4* 33.3*  MCV 89.7 90.5 90.2  PLT 266 259 251   Cardiac Enzymes:  Recent Labs Lab 04/16/17 2246 04/17/17 0430 04/17/17 1034  TROPONINI <0.03 <0.03 <0.03   BNP: BNP (last 3 results) No results for input(s): BNP in the last 8760 hours.  ProBNP (last 3 results) No results for input(s): PROBNP in the last 8760 hours.  CBG:  Recent Labs Lab 04/18/17 1141 04/18/17 1630 04/18/17 2048 04/19/17 0633 04/19/17 1104  GLUCAP 151* 138* 162* 150* 203*    Signed:  Velvet Bathe MD.  [facility] 04/19/2017, 11:51 AM

## 2017-04-20 LAB — VAS US CAROTID
LCCADDIAS: 19 cm/s
LCCAPDIAS: 20 cm/s
LCCAPSYS: 109 cm/s
LEFT ECA DIAS: -12 cm/s
LEFT VERTEBRAL DIAS: 12 cm/s
LICADDIAS: -18 cm/s
LICADSYS: -66 cm/s
LICAPDIAS: -21 cm/s
LICAPSYS: -103 cm/s
Left CCA dist sys: 98 cm/s
RIGHT ECA DIAS: -6 cm/s
RIGHT VERTEBRAL DIAS: 19 cm/s
Right CCA prox dias: -9 cm/s
Right CCA prox sys: -72 cm/s
Right cca dist sys: -24 cm/s

## 2017-04-22 ENCOUNTER — Encounter: Payer: Self-pay | Admitting: Internal Medicine

## 2017-04-22 ENCOUNTER — Ambulatory Visit (INDEPENDENT_AMBULATORY_CARE_PROVIDER_SITE_OTHER): Payer: Medicare Other | Admitting: Internal Medicine

## 2017-04-22 ENCOUNTER — Other Ambulatory Visit: Payer: Self-pay | Admitting: *Deleted

## 2017-04-22 VITALS — BP 130/60 | HR 69 | Temp 97.3°F | Resp 16 | Ht 63.0 in | Wt 142.4 lb

## 2017-04-22 DIAGNOSIS — Z79899 Other long term (current) drug therapy: Secondary | ICD-10-CM | POA: Diagnosis not present

## 2017-04-22 DIAGNOSIS — I10 Essential (primary) hypertension: Secondary | ICD-10-CM

## 2017-04-22 DIAGNOSIS — R55 Syncope and collapse: Secondary | ICD-10-CM | POA: Diagnosis not present

## 2017-04-22 DIAGNOSIS — I480 Paroxysmal atrial fibrillation: Secondary | ICD-10-CM

## 2017-04-22 DIAGNOSIS — Z7901 Long term (current) use of anticoagulants: Secondary | ICD-10-CM | POA: Diagnosis not present

## 2017-04-22 DIAGNOSIS — E1121 Type 2 diabetes mellitus with diabetic nephropathy: Secondary | ICD-10-CM | POA: Diagnosis not present

## 2017-04-22 LAB — CBC WITH DIFFERENTIAL/PLATELET
BASOS PCT: 1 %
Basophils Absolute: 67 cells/uL (ref 0–200)
EOS PCT: 6 %
Eosinophils Absolute: 402 cells/uL (ref 15–500)
HCT: 36.5 % (ref 35.0–45.0)
Hemoglobin: 12 g/dL (ref 11.7–15.5)
Lymphocytes Relative: 29 %
Lymphs Abs: 1943 cells/uL (ref 850–3900)
MCH: 29.8 pg (ref 27.0–33.0)
MCHC: 32.9 g/dL (ref 32.0–36.0)
MCV: 90.6 fL (ref 80.0–100.0)
MONOS PCT: 7 %
MPV: 8.9 fL (ref 7.5–12.5)
Monocytes Absolute: 469 cells/uL (ref 200–950)
NEUTROS ABS: 3819 {cells}/uL (ref 1500–7800)
Neutrophils Relative %: 57 %
PLATELETS: 315 10*3/uL (ref 140–400)
RBC: 4.03 MIL/uL (ref 3.80–5.10)
RDW: 13.3 % (ref 11.0–15.0)
WBC: 6.7 10*3/uL (ref 3.8–10.8)

## 2017-04-22 MED ORDER — METFORMIN HCL ER 500 MG PO TB24: ORAL_TABLET | ORAL | 1 refills | Status: DC

## 2017-04-22 MED ORDER — METOPROLOL SUCCINATE ER 200 MG PO TB24: ORAL_TABLET | ORAL | 1 refills | Status: DC

## 2017-04-22 NOTE — Patient Instructions (Signed)

## 2017-04-22 NOTE — Progress Notes (Signed)
[place]     This very nice 83 y.o. DWF was admitted 6/6-04/19/2017 and presents for post hospital follow up for PreSyncope (w/o LOC) attributed to pAfib and SSS w/PPM.  Patient has been on coumadin and has been very poorly compliant with f/u coag monitoring. Cardiac ischemia was r/o by negative Troponins. Patient had a neg Head CT scan and 2D cardiac echo, but she was found to have an 80% Rt Carotid Aa stenosis to be repaired by Dr Trula Slade in the near future.  Patient was contacted post discharge by office staff to assure stability and schedule f/u.       Hospitalization discharge instructions and medications are reconciled with the patient her niece and sitter.      Patient is also followed with ASHD/SSS/pAfib w/PPM, Hypertension, Hyperlipidemia, T2_NIDDM  and Vitamin D Deficiency. In Jan 2017, she underwent a Lt eye enucleation for an extensive locally imnvasive BCE.      Patient is treated for [facility] & cAfib since 2011. In 2013 PPM was implanted for SSS.  & BP has been controlled at home. Today's BP is at goal - 130/60. Patient has had no complaints of any cardiac type chest pain, palpitations, dyspnea/orthopnea/PND, dizziness, claudication, or dependent edema.     Hyperlipidemia is controlled with diet & meds. Patient denies myalgias or other med SE's. Last Lipids were  Lab Results  Component Value Date   CHOL 178 04/09/2017   HDL 65 04/09/2017   LDLCALC 75 04/09/2017   TRIG 192 (H) 04/09/2017   CHOLHDL 2.7 04/09/2017      Also, the patient has history of T2_NIDDM w/ CKD3. She does not monitor her CBG's at home.  She denies symptoms of reactive hypoglycemia, diabetic polys, paresthesias or visual blurring.  Last A1c was  Lab Results  Component Value Date   HGBA1C 6.8 (H) 04/09/2017      Further, the patient also has history of Vitamin D Deficiency of "41" in 2016 and sporadically supplements.  Last vitamin D was even lower: Lab Results  Component Value Date   VD25OH 36  04/09/2017   Current Outpatient Prescriptions on File Prior to Visit  Medication Sig  . amLODipine  10 MG  TAKE ONE  TAB EVERY DAY   . b complex vitamins  Take 1 tablet by mouth daily.  Marland Kitchen VITAMIN D  Take 1 tablet by mouth.  . CO Q 10 100 MG CAPS Take 100 mg by mouth daily.   . Magnesium 250 MG TABS Take  2 x  daily.   . metFORMIN  500 MG tablet TAKE 2 TAB TWICE DAILY  . metoprolol succ-XL 100 MG  Take 1 tab 2 x daily.  Marland Kitchen NITROSTAT 0.4 MG SL tablet as needed for chest pain.  Marland Kitchen warfarin  5 MG tablet Take 2.5-5 mg by mouth daily. Per blood work last week. Pt takes 1 tab (5 mg) on Monday, Wednesday, Friday and 0.5 tab (2.5 mg) on Tuesday, Thursday    No Known Allergies PMHx:   Past Medical History:  Diagnosis Date  . Cataract   . CKD (chronic kidney disease) stage 3, GFR 30-59 ml/min    due to DM  . Dizziness   . Hyperlipidemia   . Hypertensive cardiovascular disease   . Paroxysmal atrial fibrillation (HCC)   . Retinopathy   . Sick sinus syndrome ([place])   . Type II or unspecified type diabetes mellitus without mention of complication, not stated as uncontrolled  There is no immunization history on file for this patient. Past Surgical History:  Procedure Laterality Date  . DENTAL SURGERY    . PACEMAKER INSERTION  04/20/12   MDT Adapta L implanted by Dr Rayann Heman  . PERMANENT PACEMAKER INSERTION N/A 04/20/2012   Procedure: PERMANENT PACEMAKER INSERTION;  Surgeon: Thompson Grayer, MD;  Location[facility] CATH LAB;  Service: Cardiovascular;  Laterality: N/A;   FHx:    Reviewed / unchanged  SHx:    Reviewed / unchanged  Systems Review:  Constitutional: Denies fever, chills, wt changes, headaches, insomnia, fatigue, night sweats, change in appetite. Eyes: Denies redness, blurred vision, diplopia, discharge, itchy, watery eyes.  ENT: Denies discharge, congestion, post nasal drip, epistaxis, sore throat, earache, hearing loss, dental pain, tinnitus, vertigo, sinus pain, snoring.  CV: Denies  chest pain, palpitations, irregular heartbeat, syncope, dyspnea, diaphoresis, orthopnea, PND, claudication or edema. Respiratory: denies cough, dyspnea, DOE, pleurisy, hoarseness, laryngitis, wheezing.  Gastrointestinal: Denies dysphagia, odynophagia, heartburn, reflux, water brash, abdominal pain or cramps, nausea, vomiting, bloating, diarrhea, constipation, hematemesis, melena, hematochezia  or hemorrhoids. Genitourinary: Denies dysuria, frequency, urgency, nocturia, hesitancy, discharge, hematuria or flank pain. Musculoskeletal: Denies arthralgias, myalgias, stiffness, jt. swelling, pain, limping or strain/sprain.  Skin: Denies pruritus, rash, hives, warts, acne, eczema or change in skin lesion(s). Neuro: No weakness, tremor, incoordination, spasms, paresthesia or pain. Psychiatric: Denies confusion, memory loss or sensory loss. Endo: Denies change in weight, skin or hair change.  Heme/Lymph: No excessive bleeding, bruising or enlarged lymph nodes.  Physical Exam  BP 130/60   Pulse 69   Temp 97.3 F (36.3 C)   Resp 16   Ht 5\' 3"  (1.6 m)   Wt 142 lb 6.4 oz (64.6 kg)   BMI 25.23 kg/m   Appears  in no distress.  Eyes:  Lt eye enucleation with socket void. Rt eye w/ Nl ROM and Nl pupillary reflexes.  Sinuses: No frontal/maxillary tenderness ENT/Mouth: EAC's clear, TM's nl w/o erythema, bulging. Nares clear w/o erythema, swelling, exudates. Oropharynx clear without erythema or exudates. Oral hygiene is good. Tongue normal, non obstructing. Hearing intact.  Neck: Supple. Thyroid nl. Car 2+/2+ without bruits, nodes or JVD. Chest: Respirations nl with BS clear & equal w/o rales, rhonchi, wheezing or stridor.  Cor: Heart sounds normal w/ regular rate and rhythm without sig. murmurs, gallops, clicks or rubs. Peripheral pulses normal and equal  without edema.  Abdomen: Soft & bowel sounds normal. Non-tender w/o guarding, rebound, hernias, masses or organomegaly.  Lymphatics: Unremarkable.    Musculoskeletal: Full ROM all peripheral extremities, joint stability, 5/5 strength and normal gait.  Skin: Warm, dry without exposed rashes, lesions or ecchymosis apparent.  Neuro: Cranial nerves intact, reflexes equal bilaterally. Sensory-motor testing grossly intact. Tendon reflexes grossly intact.  Pysch: Alert & oriented x 3.  Insight and judgement nl & appropriate. No ideations.  Assessment and Plan:  1. Vasovagal syncope   2. PAF (paroxysmal atrial fibrillation) (HCC)  - Protime-INR - metoprolol succinate (TOPROL-XL) 200 MG 24 hr tablet; Take 1 tablet every morning for BP & heart  Dispense: 90 tablet; Refill: 1  3. T2_NIDDM w/Stage 3 CKD (GFR 40 ml/min)  - BASIC METABOLIC PANEL WITH GFR - metFORMIN (GLUCOPHAGE XR) 500 MG 24 hr tablet; Take 1 to 2 tablets 2 x / day with meals for Diabetes  Dispense: 360 tablet; Refill: 1  4. Essential hypertension  - CBC with Differential/Platelet - BASIC METABOLIC PANEL WITH GFR - metoprolol succinate (TOPROL-XL) 200 MG 24 hr tablet; Take 1 tablet  every morning for BP & heart  Dispense: 90 tablet; Refill: 1  5. Long term current use of anticoagulant therapy  - Protime-INR - Long discussion with Patient, & her niece re: importance of close f/u on Coumadin.   6. Medication management  - CBC with Differential/Platelet - BASIC METABOLIC PANEL WITH GFR       Discussed  regular exercise, BP monitoring, weight control to achieve/maintain BMI less than 25 and discussed med and SE's. Recommended labs to assess and monitor clinical status with further disposition pending results of labs. Over 45 minutes of exam, counseling, chart review was performed.

## 2017-04-23 ENCOUNTER — Telehealth: Payer: Self-pay | Admitting: *Deleted

## 2017-04-23 LAB — BASIC METABOLIC PANEL WITHOUT GFR
BUN: 27 mg/dL — ABNORMAL HIGH (ref 7–25)
CO2: 18 mmol/L — ABNORMAL LOW (ref 20–31)
Calcium: 9.6 mg/dL (ref 8.6–10.4)
Chloride: 106 mmol/L (ref 98–110)
Creat: 1.08 mg/dL — ABNORMAL HIGH (ref 0.60–0.88)
GFR, Est African American: 53 mL/min — ABNORMAL LOW (ref >=60)
GFR, Est Non African American: 46 mL/min — ABNORMAL LOW (ref >=60)
Glucose, Bld: 107 mg/dL — ABNORMAL HIGH (ref 65–99)
Potassium: 5.4 mmol/L — ABNORMAL HIGH (ref 3.5–5.3)
Sodium: 138 mmol/L (ref 135–146)

## 2017-04-23 LAB — PROTIME-INR
INR: 1.8 — AB
PROTHROMBIN TIME: 18.5 s — AB (ref 9.0–11.5)

## 2017-04-23 MED ORDER — GLIMEPIRIDE 2 MG PO TABS: ORAL_TABLET | ORAL | 5 refills | Status: DC

## 2017-04-23 NOTE — Telephone Encounter (Signed)
[name] called and reported the patient's blood sugar was 340 10 to 15 minutes after eating lunch today. Per Dr Melford Aase, an RX for Glimepiride 2 mg 1/2 to 1 tablet at breakfast and lunch has been sent in to her pharmacy. The niece was advised to always check her blood sugar before breakfast or supper and start with 1/2 tablet of Glimepiride at breakfast and lunch.

## 2017-04-24 ENCOUNTER — Ambulatory Visit (INDEPENDENT_AMBULATORY_CARE_PROVIDER_SITE_OTHER): Payer: Medicare Other | Admitting: *Deleted

## 2017-04-24 DIAGNOSIS — I495 Sick sinus syndrome: Secondary | ICD-10-CM

## 2017-04-24 LAB — CUP PACEART INCLINIC DEVICE CHECK
Battery Impedance: 257 Ohm
Brady Statistic AP VS Percent: 2 %
Brady Statistic AS VS Percent: 98 %
Date Time Interrogation Session: 20180614124009
Implantable Lead Implant Date: 20130610
Implantable Lead Location: 753860
Implantable Lead Model: 5076
Lead Channel Impedance Value: 802 Ohm
Lead Channel Pacing Threshold Amplitude: 0.75 V
Lead Channel Pacing Threshold Pulse Width: 0.4 ms
Lead Channel Sensing Intrinsic Amplitude: 22.4 mV
Lead Channel Setting Pacing Amplitude: 2 V
Lead Channel Setting Pacing Pulse Width: 0.4 ms
Lead Channel Setting Sensing Sensitivity: 5.6 mV
MDC IDC LEAD IMPLANT DT: 20130610
MDC IDC LEAD LOCATION: 753859
MDC IDC MSMT BATTERY REMAINING LONGEVITY: 127 mo
MDC IDC MSMT BATTERY VOLTAGE: 2.78 V
MDC IDC MSMT LEADCHNL RA IMPEDANCE VALUE: 330 Ohm
MDC IDC MSMT LEADCHNL RA PACING THRESHOLD AMPLITUDE: 0.75 V
MDC IDC MSMT LEADCHNL RA PACING THRESHOLD PULSEWIDTH: 0.4 ms
MDC IDC MSMT LEADCHNL RA SENSING INTR AMPL: 4 mV
MDC IDC PG IMPLANT DT: 20130610
MDC IDC SET LEADCHNL RV PACING AMPLITUDE: 2.5 V
MDC IDC STAT BRADY AP VP PERCENT: 0 %
MDC IDC STAT BRADY AS VP PERCENT: 0 %

## 2017-04-24 NOTE — Progress Notes (Signed)
Pacemaker check in clinic. Normal device function. Thresholds, sensing, impedances consistent with previous measurements. Device programmed to maximize longevity. 1,014 mode switches (0.3%)+Warfarin-- EGMs appear AF, longest 2 hours, Max A rate >400 bpm, Max V rate 130 bpm. No high ventricular rates noted. Device programmed at appropriate safety margins. Histogram distribution appropriate for patient activity level. Device programmed to optimize intrinsic conduction. Estimated longevity 10.5 years. Patient education completed. ROV with JA 07/24/17.

## 2017-04-25 LAB — ECHOCARDIOGRAM COMPLETE
Height: 62 in
Weight: 2137.6 oz

## 2017-04-28 ENCOUNTER — Other Ambulatory Visit: Payer: Self-pay | Admitting: Internal Medicine

## 2017-04-28 MED ORDER — LISINOPRIL 40 MG PO TABS: 40.0000 mg | ORAL_TABLET | Freq: Every day | ORAL | 1 refills | Status: DC

## 2017-04-29 ENCOUNTER — Telehealth: Payer: Self-pay | Admitting: *Deleted

## 2017-04-29 ENCOUNTER — Other Ambulatory Visit: Payer: Self-pay | Admitting: *Deleted

## 2017-04-29 MED ORDER — AMLODIPINE BESYLATE 10 MG PO TABS: ORAL_TABLET | ORAL | 1 refills | Status: DC

## 2017-04-29 MED ORDER — LISINOPRIL 40 MG PO TABS: 40.0000 mg | ORAL_TABLET | Freq: Every day | ORAL | 1 refills | Status: DC

## 2017-04-29 NOTE — Telephone Encounter (Signed)
[name] called and reported the patient's pulse has been between 116 and 120.  Per Dr Melford Aase, continue the Amlodipine 10 mg. Metoprolol 200 mg daily and a new RX for Lisinopril 40 mg to take 1 tablet when gets the RX and then 1 tablet daily, in the AM.  Jolee Ewing is aware.

## 2017-05-05 ENCOUNTER — Other Ambulatory Visit: Payer: Self-pay

## 2017-05-05 ENCOUNTER — Telehealth: Payer: Self-pay | Admitting: Internal Medicine

## 2017-05-05 NOTE — Telephone Encounter (Signed)
New message       [facility] Pre-operative Risk Assessment    Request for surgical clearance:  1. What type of surgery is being performed? Right CEA  2. When is this surgery scheduled? 05/13/17  3. Are there any medications that need to be held prior to surgery and how long? Coumadin-Need pt to hold 5 days prior. Does pt need a Lovenox bridge? And can pt have cardiac clearance for procedure?  4. Name of physician performing surgery? Dr. Trula Slade  5. What is your office phone and fax number? [phone]  Alben Spittle 05/05/2017, 1:36 PM  _________________________________________________________________   (provider comments below)

## 2017-05-05 NOTE — Telephone Encounter (Signed)
Pt on warfarin for Afib with CHADS<4 (age, HTN, DM) and no history of stroke. Ok to hold 5 days prior to procedure per protocol. Faxed to number provided.

## 2017-05-06 ENCOUNTER — Other Ambulatory Visit (HOSPITAL_COMMUNITY): Payer: Self-pay | Admitting: *Deleted

## 2017-05-06 ENCOUNTER — Encounter (HOSPITAL_COMMUNITY): Payer: Self-pay

## 2017-05-06 NOTE — Pre-Procedure Instructions (Addendum)
Kylie Sanchez  05/06/2017    Your procedure is scheduled on Tuesday, May 13, 2017 at 57 Noon.   Report to [facility] Entrance "A" Admitting Office at 10:00 AM.   Call this number if you have problems the morning of surgery: [phone]   Questions prior to day of surgery, please call [phone] between 8 & 4 PM.   Remember:  Do not eat food or drink liquids after midnight Monday, 05/12/17.  Take these medicines the morning of surgery with A SIP OF WATER: Amlodipine (Norvasc), Metoprolol (Toprol XL)  Stop CoQ10 and Vitamins 5 days as of today.   How to Manage Your Diabetes Before Surgery   Why is it important to control my blood sugar before and after surgery?   Improving blood sugar levels before and after surgery helps healing and can limit problems.  A way of improving blood sugar control is eating a healthy diet by:  - Eating less sugar and carbohydrates  - Increasing activity/exercise  - Talk with your doctor about reaching your blood sugar goals  High blood sugars (greater than 180 mg/dL) can raise your risk of infections and slow down your recovery so you will need to focus on controlling your diabetes during the weeks before surgery.  Make sure that the doctor who takes care of your diabetes knows about your planned surgery including the date and location.   References:  [facility], 2007 "How to Manage your Diabetes Before and After Surgery".  What do I do about my diabetes medications?   Do not take oral diabetes medicines (Metformin and Glimiperide) the morning of surgery.   Do not wear jewelry, make-up or nail polish.  Do not wear lotions, powders, perfumes or deodorant.  Do not shave 48 hours prior to surgery.    Do not bring valuables to the hospital.  [facility] is not responsible for any belongings or valuables.  Contacts, dentures or bridgework may not be worn into surgery.  Leave your suitcase in the car.  After  surgery it may be brought to your room.  For patients admitted to the hospital, discharge time will be determined by your treatment team.   [facility] - Preparing for Surgery  Before surgery, you can play an important role.  Because skin is not sterile, your skin needs to be as free of germs as possible.  You can reduce the number of germs on you skin by washing with CHG (chlorahexidine gluconate) soap before surgery.  CHG is an antiseptic cleaner which kills germs and bonds with the skin to continue killing germs even after washing.  Please DO NOT use if you have an allergy to CHG or antibacterial soaps.  If your skin becomes reddened/irritated stop using the CHG and inform your nurse when you arrive at Short Stay.  Do not shave (including legs and underarms) for at least 48 hours prior to the first CHG shower.  You may shave your face.  Please follow these instructions carefully:   1.  Shower with CHG Soap the night before surgery and the                    morning of Surgery.  2.  If you choose to wash your hair, wash your hair first as usual with your       normal shampoo.  3.  After you shampoo, rinse your hair and body thoroughly to remove the shampoo.  4.  Use CHG as you would any other liquid soap.  You can apply chg directly       to the skin and wash gently with scrungie or a clean washcloth.  5.  Apply the CHG Soap to your body ONLY FROM THE NECK DOWN.        Do not use on open wounds or open sores.  Avoid contact with your eyes, ears, mouth and genitals (private parts).  Wash genitals (private parts) with your normal soap.  6.  Wash thoroughly, paying special attention to the area where your surgery        will be performed.  7.  Thoroughly rinse your body with warm water from the neck down.  8.  DO NOT shower/wash with your normal soap after using and rinsing off       the CHG Soap.  9.  Pat yourself dry with a clean towel.            10.  Wear clean pajamas.            11.   Place clean sheets on your bed the night of your first shower and do not        sleep with pets.  Day of Surgery  Do not apply any lotions/deodorants the morning of surgery.  Please wear clean clothes to the hospital.   Please read over the fact sheets that you were given.

## 2017-05-07 ENCOUNTER — Other Ambulatory Visit: Payer: Self-pay

## 2017-05-07 ENCOUNTER — Encounter (HOSPITAL_COMMUNITY)
Admission: RE | Admit: 2017-05-07 | Discharge: 2017-05-07 | Disposition: A | Discharge status: Home / Self Care | Payer: Medicare Other | Source: Ambulatory Visit | Attending: Surgery | Admitting: Surgery

## 2017-05-07 ENCOUNTER — Encounter (HOSPITAL_COMMUNITY): Payer: Self-pay

## 2017-05-07 DIAGNOSIS — E1122 Type 2 diabetes mellitus with diabetic chronic kidney disease: Secondary | ICD-10-CM | POA: Insufficient documentation

## 2017-05-07 DIAGNOSIS — E785 Hyperlipidemia, unspecified: Secondary | ICD-10-CM | POA: Diagnosis not present

## 2017-05-07 DIAGNOSIS — Z01818 Encounter for other preprocedural examination: Secondary | ICD-10-CM | POA: Insufficient documentation

## 2017-05-07 DIAGNOSIS — I129 Hypertensive chronic kidney disease with stage 1 through stage 4 chronic kidney disease, or unspecified chronic kidney disease: Secondary | ICD-10-CM | POA: Insufficient documentation

## 2017-05-07 DIAGNOSIS — Z7902 Long term (current) use of antithrombotics/antiplatelets: Secondary | ICD-10-CM | POA: Diagnosis not present

## 2017-05-07 DIAGNOSIS — I4891 Unspecified atrial fibrillation: Secondary | ICD-10-CM | POA: Insufficient documentation

## 2017-05-07 DIAGNOSIS — Z95810 Presence of automatic (implantable) cardiac defibrillator: Secondary | ICD-10-CM | POA: Diagnosis not present

## 2017-05-07 DIAGNOSIS — N189 Chronic kidney disease, unspecified: Secondary | ICD-10-CM | POA: Diagnosis not present

## 2017-05-07 DIAGNOSIS — Z808 Family history of malignant neoplasm of other organs or systems: Secondary | ICD-10-CM | POA: Insufficient documentation

## 2017-05-07 DIAGNOSIS — I495 Sick sinus syndrome: Secondary | ICD-10-CM | POA: Diagnosis not present

## 2017-05-07 DIAGNOSIS — Z7984 Long term (current) use of oral hypoglycemic drugs: Secondary | ICD-10-CM | POA: Insufficient documentation

## 2017-05-07 HISTORY — DX: Presence of cardiac pacemaker: Z95.0

## 2017-05-07 HISTORY — DX: Unspecified visual loss: H54.7

## 2017-05-07 HISTORY — DX: Essential (primary) hypertension: I10

## 2017-05-07 HISTORY — DX: Malignant (primary) neoplasm, unspecified: C80.1

## 2017-05-07 LAB — COMPREHENSIVE METABOLIC PANEL
ALK PHOS: 71 U/L (ref 38–126)
ALT: 16 U/L (ref 14–54)
AST: 20 U/L (ref 15–41)
Albumin: 3.8 g/dL (ref 3.5–5.0)
Anion gap: 9 (ref 5–15)
BUN: 25 mg/dL — ABNORMAL HIGH (ref 6–20)
CALCIUM: 9.5 mg/dL (ref 8.9–10.3)
CO2: 19 mmol/L — AB (ref 22–32)
CREATININE: 1.21 mg/dL — AB (ref 0.44–1.00)
Chloride: 107 mmol/L (ref 101–111)
GFR, EST AFRICAN AMERICAN: 45 mL/min — AB (ref >60)
GFR, EST NON AFRICAN AMERICAN: 38 mL/min — AB (ref >60)
Glucose, Bld: 79 mg/dL (ref 65–99)
Potassium: 5.6 mmol/L — ABNORMAL HIGH (ref 3.5–5.1)
SODIUM: 135 mmol/L (ref 135–145)
TOTAL PROTEIN: 7.4 g/dL (ref 6.5–8.1)
Total Bilirubin: 0.4 mg/dL (ref 0.3–1.2)

## 2017-05-07 LAB — CBC
HCT: 37.9 % (ref 36.0–46.0)
HEMOGLOBIN: 12.5 g/dL (ref 12.0–15.0)
MCH: 30.6 pg (ref 26.0–34.0)
MCHC: 33 g/dL (ref 30.0–36.0)
MCV: 92.9 fL (ref 78.0–100.0)
Platelets: 344 10*3/uL (ref 150–400)
RBC: 4.08 MIL/uL (ref 3.87–5.11)
RDW: 13.1 % (ref 11.5–15.5)
WBC: 4.5 10*3/uL (ref 4.0–10.5)

## 2017-05-07 LAB — SURGICAL PCR SCREEN
MRSA, PCR: POSITIVE — AB
Staphylococcus aureus: POSITIVE — AB

## 2017-05-07 LAB — URINALYSIS, ROUTINE W REFLEX MICROSCOPIC
Bilirubin Urine: NEGATIVE
GLUCOSE, UA: NEGATIVE mg/dL
HGB URINE DIPSTICK: NEGATIVE
Ketones, ur: NEGATIVE mg/dL
LEUKOCYTES UA: NEGATIVE
Nitrite: NEGATIVE
PH: 5.5 (ref 5.0–8.0)
PROTEIN: NEGATIVE mg/dL

## 2017-05-07 LAB — ABO/RH: ABO/RH(D): O POS

## 2017-05-07 LAB — GLUCOSE, CAPILLARY: Glucose-Capillary: 68 mg/dL (ref 65–99)

## 2017-05-07 LAB — PROTIME-INR
INR: 0.98
PROTHROMBIN TIME: 13 s (ref 11.4–15.2)

## 2017-05-07 LAB — APTT: aPTT: 26 seconds (ref 24–36)

## 2017-05-07 NOTE — Progress Notes (Signed)
Mupirocin Ointment Rx called into [facility] for positive PCR of MRSA and Staph. Left message on Kylie Sanchez's [facility]) voicemail informing her of results and that Rx has been called in and will be delivered to pt tomorrow.

## 2017-05-07 NOTE — Progress Notes (Signed)
Pt has hx of A-fib and has a pacemaker. Pt denies any recent chest pain or sob. Pt is diabetic, type 2. Last A1C was 6/8 on 04/09/17. Pt's caregiver states pt's blood sugar is usually between 120-135. Pt is on Warfarin and has been instructed to stop 5 days prior to surgery. Her last dose will be this evening.

## 2017-05-08 NOTE — Progress Notes (Signed)
Anesthesia Chart Review:  Pt is an 83 year old female scheduled for R CEA on 05/13/2017 with Harold Barban, MD.  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 Never smoker. BMI 25  - Hospitalized 6/6-9/18 for near syncope thought due to afib, tachycardia-bradycardia. Severe carotid stenosis discovered, pt seen by vascular surgery.   Medications include: Amlodipine, glimepiride, lisinopril, metformin, metoprolol, Coumadin. Pt to hold coumadin 5 days prior to surgery.   Preoperative labs reviewed.   - K 5.6. I notified Arbie Cookey in Dr. Stephens Shire office.  Will repeat K DOS - PT will be obtained DOS   CXR 04/16/17: No edema or consolidation. Stable mild scarring left base. Stable cardiac silhouette. Aortic atherosclerosis.  EKG 04/16/17: Sinus tachycardia (118 bpm). RSR' in V1 or V2, probably normal variant. Borderline repolarization abnormality  Carotid duplex 04/18/17:  - The vertebral arteries appear patent with antegrade flow. - Findings consistent with 80 -99 percent stenosis involving the right internal carotid artery. - Findings consistent with 1-39 percent stenosis involving the left internal carotid artery.  Echo 04/17/17:  - Left ventricle: The cavity size was normal. Wall thickness was increased in a pattern of severe LVH. Systolic function was normal. The estimated ejection fraction was in the range of 55% to 60%. Wall motion was normal; there were no regional wall motion abnormalities. Doppler parameters are consistent with abnormal left ventricular relaxation (grade 1 diastolic dysfunction). - Aortic valve: Small mobile calcium deposit seen on PSL below aortic valve near base of anterior leaflet. - Mitral valve: Calcified annulus. Mildly thickened leaflets . - Atrial septum: No defect or patent  foramen ovale was identified. - Pulmonary arteries: PA peak pressure: 32 mm Hg (S).  Perioperative prescription for pacemaker form notes procedure should not interfere with device function.   If labs acceptable DOS, I anticipate pt can proceed as scheduled.   Willeen Cass, FNP-BC [facility]/Anesthesiology Phone: [phone] 05/08/2017 11:29 AM

## 2017-05-12 ENCOUNTER — Encounter: Payer: Self-pay | Admitting: Surgery

## 2017-05-12 ENCOUNTER — Encounter: Payer: Self-pay | Admitting: Internal Medicine

## 2017-05-12 ENCOUNTER — Telehealth: Payer: Self-pay | Admitting: *Deleted

## 2017-05-12 ENCOUNTER — Ambulatory Visit (INDEPENDENT_AMBULATORY_CARE_PROVIDER_SITE_OTHER): Payer: Medicare Other | Admitting: Internal Medicine

## 2017-05-12 VITALS — BP 120/66 | HR 84 | Ht 62.0 in | Wt 140.0 lb

## 2017-05-12 DIAGNOSIS — I480 Paroxysmal atrial fibrillation: Secondary | ICD-10-CM | POA: Diagnosis not present

## 2017-05-12 DIAGNOSIS — R55 Syncope and collapse: Secondary | ICD-10-CM

## 2017-05-12 DIAGNOSIS — Z01810 Encounter for preprocedural cardiovascular examination: Secondary | ICD-10-CM | POA: Diagnosis not present

## 2017-05-12 DIAGNOSIS — R001 Bradycardia, unspecified: Secondary | ICD-10-CM

## 2017-05-12 NOTE — Patient Instructions (Addendum)
Medication Instructions:    Your physician recommends that you continue on your current medications as directed. Please refer to the Current Medication list given to you today.  - If you need a refill on your cardiac medications before your next appointment, please call your pharmacy.   Labwork:  None ordered  Testing/Procedures:  None ordered  Follow-Up:  Your physician wants you to follow-up in: 6 months with device clinic.  You will receive a reminder letter in the mail two months in advance. If you don't receive a letter, please call our office to schedule the follow-up appointment.   Your physician wants you to follow-up in: 1 year with Chanetta Marshall, NP.  You will receive a reminder letter in the mail two months in advance. If you don't receive a letter, please call our office to schedule the follow-up appointment.  Thank you for choosing [facility]!!

## 2017-05-12 NOTE — Telephone Encounter (Signed)
Faxed cardiac clearance for Right CEA w/ Dr. Trula Slade on 7/3.

## 2017-05-12 NOTE — Progress Notes (Signed)
PCP: Unk Pinto, MD   Kylie Sanchez is a 83 y.o. female who presents today for routine electrophysiology followup.  Since last being seen in our clinic, the patient reports doing reasonably well.  She had transient syncope 6/18.  Workup revealed advanced R carotid stenosis. She is planned for surgery with Dr Trula Slade tomorrow.  She presents today for preoperative clearance.  She has not been compliant with outpatient follow-up in our office.  Dr Mariana Arn consult note from 6/18 (reviewed today) suggests medicine nonadherence as well.  She is not very active at home.  She is mostly limited by her vision.  Today, she denies symptoms of palpitations, chest pain, shortness of breath, or recurrent syncope.  + edema.  The patient is otherwise without complaint today.   Past Medical History:  Diagnosis Date  . Cancer ([place]) 2015   left eye  . Cataract   . CKD (chronic kidney disease) stage 3, GFR 30-59 ml/min    due to DM  . Dizziness   . Hyperlipidemia   . Hypertension   . Hypertensive cardiovascular disease   . Impaired vision   . Paroxysmal atrial fibrillation (HCC)   . Presence of permanent cardiac pacemaker   . Retinopathy   . Sick sinus syndrome ([place])   . Type II or unspecified type diabetes mellitus without mention of complication, not stated as uncontrolled    Past Surgical History:  Procedure Laterality Date  . DENTAL SURGERY    . EYE SURGERY Left    tumor and eye removed  . PACEMAKER INSERTION  04/20/12   MDT Adapta L implanted by Dr Rayann Heman  . PERMANENT PACEMAKER INSERTION N/A 04/20/2012   Procedure: PERMANENT PACEMAKER INSERTION;  Surgeon: Thompson Grayer, MD;  Location: [facility] CATH LAB;  Service: Cardiovascular;  Laterality: N/A;    ROS- all systems are reviewed and negative except as per HPI above  Current Outpatient Prescriptions  Medication Sig Dispense Refill  . amLODipine (NORVASC) 10 MG tablet TAKE ONE (1) TABLET BY MOUTH EVERY DAY (OVERDUE FOR AN APPTMT. CALL TO SCHEDULE)  (Patient taking differently: Take 10 mg by mouth daily. ) 90 tablet 1  . b complex vitamins tablet Take 1 tablet by mouth daily.    . Cholecalciferol (VITAMIN D PO) Take 1 tablet by mouth every evening.     . Coenzyme Q10 (CO Q 10) 100 MG CAPS Take 100 mg by mouth every evening.     Marland Kitchen glimepiride (AMARYL) 2 MG tablet Take 1-2 mg by mouth daily with breakfast.    . lisinopril (PRINIVIL,ZESTRIL) 40 MG tablet Take 1 tablet (40 mg total) by mouth daily. 90 tablet 1  . Magnesium 250 MG TABS Take 250 mg by mouth every evening.     . metFORMIN (GLUCOPHAGE-XR) 500 MG 24 hr tablet Take 1-2 tablets by mouth every morning. Take with meal for diabetes    . metoprolol succinate (TOPROL-XL) 200 MG 24 hr tablet Take 1 tablet every morning for BP & heart (Patient taking differently: Take 100 mg by mouth daily. ) 90 tablet 1  . nitroGLYCERIN (NITROSTAT) 0.4 MG SL tablet Place 1 tablet (0.4 mg total) under the tongue every 5 (five) minutes as needed for chest pain. 100 tablet 3  . warfarin (COUMADIN) 5 MG tablet Take 2.5-5 mg by mouth See admin instructions. Take 5 mg by mouth daily on Monday, Wednesday, Friday and take 2.5 mg by mouth on Sunday, Tuesday, Thursday, Saturday     No current facility-administered medications for this visit.  Physical Exam: Vitals:   05/12/17 0834  BP: 120/66  Pulse: 84  SpO2: 96%  Weight: 140 lb (63.5 kg)  Height: 5\' 2"  (1.575 m)    GEN- The patient is well appearing, alert and oriented x 3 today.   Head- normocephalic, atraumatic Eyes-   L eye enucliated Ears- hearing intact Oropharynx- clear Lungs- Clear to ausculation bilaterally, normal work of breathing Chest- pacemaker pocket is well healed Heart- Regular rate and rhythm, no murmurs, rubs or gallops, PMI not laterally displaced GI- soft, NT, ND, + BS Extremities- no clubbing, cyanosis, +1 edema  Pacemaker interrogation- reviewed in detail from 04/24/17  Echo is reviewed ekg from 04/16/17 is  reviewed  Assessment and Plan:  1. Symptomatic bradycardia Normal pacemaker function on review of interrogation 04/24/17 See Pace Art report No changes today Paces only 2%  2. Paroxysmal afib On coumadin chads2vasc score is 6. Rate controlled Overall burden is 0.3%.  No changes today  3. HTN Stable No change required today  4. preop Given advanced age and comorbidities, she is at moderate to high risk for procedures.  I do not feel that additional CV testing will reduce her risks at this time.  Proceed with surgery if medically indicated without further CV testing. Anticoagulation has already been held by Dr Trula Slade x 5 days per patient.  I have instructed her to resume anticoagulation as directed by Dr Trula Slade post operatively.   carelink every 3 months Return to see the EP NP in 12 months unless problems arise in the interim.   Thompson Grayer MD, [facility] 05/12/2017 9:01 AM

## 2017-05-13 ENCOUNTER — Encounter (HOSPITAL_COMMUNITY): Payer: Self-pay | Admitting: *Deleted

## 2017-05-13 ENCOUNTER — Inpatient Hospital Stay (HOSPITAL_COMMUNITY)
Admission: RE | Admit: 2017-05-13 | Discharge: 2017-05-26 | DRG: 038 | Disposition: A | Discharge status: Skilled Nursing Facility | Payer: Medicare Other | Source: Ambulatory Visit | Attending: Surgery | Admitting: Surgery

## 2017-05-13 ENCOUNTER — Inpatient Hospital Stay (HOSPITAL_COMMUNITY): Payer: Medicare Other | Admitting: Emergency Medicine

## 2017-05-13 ENCOUNTER — Inpatient Hospital Stay (HOSPITAL_COMMUNITY): Payer: Medicare Other

## 2017-05-13 ENCOUNTER — Encounter (HOSPITAL_COMMUNITY)
Admission: RE | Disposition: A | Discharge status: Skilled Nursing Facility | Payer: Self-pay | Source: Ambulatory Visit | Attending: Surgery

## 2017-05-13 ENCOUNTER — Inpatient Hospital Stay (HOSPITAL_COMMUNITY): Payer: Medicare Other | Admitting: Certified Registered"

## 2017-05-13 DIAGNOSIS — E785 Hyperlipidemia, unspecified: Secondary | ICD-10-CM | POA: Diagnosis present

## 2017-05-13 DIAGNOSIS — E861 Hypovolemia: Secondary | ICD-10-CM | POA: Diagnosis present

## 2017-05-13 DIAGNOSIS — I340 Nonrheumatic mitral (valve) insufficiency: Secondary | ICD-10-CM | POA: Diagnosis not present

## 2017-05-13 DIAGNOSIS — R2681 Unsteadiness on feet: Secondary | ICD-10-CM | POA: Diagnosis not present

## 2017-05-13 DIAGNOSIS — I10 Essential (primary) hypertension: Secondary | ICD-10-CM

## 2017-05-13 DIAGNOSIS — Z7901 Long term (current) use of anticoagulants: Secondary | ICD-10-CM | POA: Diagnosis not present

## 2017-05-13 DIAGNOSIS — I495 Sick sinus syndrome: Secondary | ICD-10-CM | POA: Diagnosis present

## 2017-05-13 DIAGNOSIS — I420 Dilated cardiomyopathy: Secondary | ICD-10-CM | POA: Diagnosis present

## 2017-05-13 DIAGNOSIS — L8990 Pressure ulcer of unspecified site, unspecified stage: Secondary | ICD-10-CM | POA: Insufficient documentation

## 2017-05-13 DIAGNOSIS — E11319 Type 2 diabetes mellitus with unspecified diabetic retinopathy without macular edema: Secondary | ICD-10-CM | POA: Diagnosis present

## 2017-05-13 DIAGNOSIS — R41841 Cognitive communication deficit: Secondary | ICD-10-CM | POA: Diagnosis not present

## 2017-05-13 DIAGNOSIS — Y838 Other surgical procedures as the cause of abnormal reaction of the patient, or of later complication, without mention of misadventure at the time of the procedure: Secondary | ICD-10-CM | POA: Diagnosis not present

## 2017-05-13 DIAGNOSIS — Z85828 Personal history of other malignant neoplasm of skin: Secondary | ICD-10-CM

## 2017-05-13 DIAGNOSIS — N179 Acute kidney failure, unspecified: Secondary | ICD-10-CM | POA: Diagnosis present

## 2017-05-13 DIAGNOSIS — I6521 Occlusion and stenosis of right carotid artery: Principal | ICD-10-CM | POA: Diagnosis present

## 2017-05-13 DIAGNOSIS — E784 Other hyperlipidemia: Secondary | ICD-10-CM | POA: Diagnosis not present

## 2017-05-13 DIAGNOSIS — R4781 Slurred speech: Secondary | ICD-10-CM | POA: Diagnosis not present

## 2017-05-13 DIAGNOSIS — J90 Pleural effusion, not elsewhere classified: Secondary | ICD-10-CM | POA: Diagnosis not present

## 2017-05-13 DIAGNOSIS — I9589 Other hypotension: Secondary | ICD-10-CM | POA: Diagnosis not present

## 2017-05-13 DIAGNOSIS — I4819 Other persistent atrial fibrillation: Secondary | ICD-10-CM | POA: Diagnosis present

## 2017-05-13 DIAGNOSIS — I959 Hypotension, unspecified: Secondary | ICD-10-CM | POA: Diagnosis not present

## 2017-05-13 DIAGNOSIS — R29810 Facial weakness: Secondary | ICD-10-CM

## 2017-05-13 DIAGNOSIS — Z66 Do not resuscitate: Secondary | ICD-10-CM | POA: Diagnosis present

## 2017-05-13 DIAGNOSIS — H269 Unspecified cataract: Secondary | ICD-10-CM | POA: Diagnosis present

## 2017-05-13 DIAGNOSIS — Z7982 Long term (current) use of aspirin: Secondary | ICD-10-CM

## 2017-05-13 DIAGNOSIS — N183 Chronic kidney disease, stage 3 (moderate): Secondary | ICD-10-CM | POA: Diagnosis present

## 2017-05-13 DIAGNOSIS — I472 Ventricular tachycardia: Secondary | ICD-10-CM | POA: Diagnosis not present

## 2017-05-13 DIAGNOSIS — R0789 Other chest pain: Secondary | ICD-10-CM | POA: Diagnosis not present

## 2017-05-13 DIAGNOSIS — I499 Cardiac arrhythmia, unspecified: Secondary | ICD-10-CM | POA: Diagnosis not present

## 2017-05-13 DIAGNOSIS — Z7189 Other specified counseling: Secondary | ICD-10-CM | POA: Diagnosis not present

## 2017-05-13 DIAGNOSIS — R1310 Dysphagia, unspecified: Secondary | ICD-10-CM | POA: Diagnosis not present

## 2017-05-13 DIAGNOSIS — D62 Acute posthemorrhagic anemia: Secondary | ICD-10-CM | POA: Diagnosis not present

## 2017-05-13 DIAGNOSIS — M6281 Muscle weakness (generalized): Secondary | ICD-10-CM | POA: Diagnosis not present

## 2017-05-13 DIAGNOSIS — R079 Chest pain, unspecified: Secondary | ICD-10-CM | POA: Diagnosis not present

## 2017-05-13 DIAGNOSIS — R7989 Other specified abnormal findings of blood chemistry: Secondary | ICD-10-CM

## 2017-05-13 DIAGNOSIS — I1310 Hypertensive heart and chronic kidney disease without heart failure, with stage 1 through stage 4 chronic kidney disease, or unspecified chronic kidney disease: Secondary | ICD-10-CM | POA: Diagnosis present

## 2017-05-13 DIAGNOSIS — Z950 Presence of cardiac pacemaker: Secondary | ICD-10-CM

## 2017-05-13 DIAGNOSIS — I480 Paroxysmal atrial fibrillation: Secondary | ICD-10-CM | POA: Diagnosis not present

## 2017-05-13 DIAGNOSIS — E1122 Type 2 diabetes mellitus with diabetic chronic kidney disease: Secondary | ICD-10-CM | POA: Diagnosis present

## 2017-05-13 DIAGNOSIS — I481 Persistent atrial fibrillation: Secondary | ICD-10-CM | POA: Diagnosis not present

## 2017-05-13 DIAGNOSIS — K5900 Constipation, unspecified: Secondary | ICD-10-CM | POA: Diagnosis not present

## 2017-05-13 DIAGNOSIS — M7989 Other specified soft tissue disorders: Secondary | ICD-10-CM | POA: Diagnosis not present

## 2017-05-13 DIAGNOSIS — R278 Other lack of coordination: Secondary | ICD-10-CM | POA: Diagnosis not present

## 2017-05-13 DIAGNOSIS — R55 Syncope and collapse: Secondary | ICD-10-CM | POA: Diagnosis not present

## 2017-05-13 DIAGNOSIS — Z79899 Other long term (current) drug therapy: Secondary | ICD-10-CM

## 2017-05-13 DIAGNOSIS — R778 Other specified abnormalities of plasma proteins: Secondary | ICD-10-CM | POA: Diagnosis present

## 2017-05-13 DIAGNOSIS — R748 Abnormal levels of other serum enzymes: Secondary | ICD-10-CM | POA: Diagnosis not present

## 2017-05-13 DIAGNOSIS — R001 Bradycardia, unspecified: Secondary | ICD-10-CM | POA: Diagnosis not present

## 2017-05-13 DIAGNOSIS — Z515 Encounter for palliative care: Secondary | ICD-10-CM | POA: Diagnosis present

## 2017-05-13 DIAGNOSIS — F329 Major depressive disorder, single episode, unspecified: Secondary | ICD-10-CM | POA: Diagnosis not present

## 2017-05-13 DIAGNOSIS — R269 Unspecified abnormalities of gait and mobility: Secondary | ICD-10-CM | POA: Diagnosis not present

## 2017-05-13 DIAGNOSIS — Z9889 Other specified postprocedural states: Secondary | ICD-10-CM | POA: Diagnosis not present

## 2017-05-13 DIAGNOSIS — E1140 Type 2 diabetes mellitus with diabetic neuropathy, unspecified: Secondary | ICD-10-CM | POA: Diagnosis not present

## 2017-05-13 DIAGNOSIS — I5181 Takotsubo syndrome: Secondary | ICD-10-CM | POA: Diagnosis present

## 2017-05-13 DIAGNOSIS — M1480 Arthropathies in other specified diseases classified elsewhere, unspecified site: Secondary | ICD-10-CM | POA: Diagnosis not present

## 2017-05-13 DIAGNOSIS — T81718A Complication of other artery following a procedure, not elsewhere classified, initial encounter: Secondary | ICD-10-CM | POA: Diagnosis not present

## 2017-05-13 HISTORY — PX: PATCH ANGIOPLASTY: SHX6230

## 2017-05-13 HISTORY — PX: ENDARTERECTOMY: SHX5162

## 2017-05-13 LAB — POCT I-STAT 4, (NA,K, GLUC, HGB,HCT)
Glucose, Bld: 119 mg/dL — ABNORMAL HIGH (ref 65–99)
HCT: 31 % — ABNORMAL LOW (ref 36.0–46.0)
HEMOGLOBIN: 10.5 g/dL — AB (ref 12.0–15.0)
Potassium: 5.1 mmol/L (ref 3.5–5.1)
Sodium: 139 mmol/L (ref 135–145)

## 2017-05-13 LAB — GLUCOSE, CAPILLARY
GLUCOSE-CAPILLARY: 91 mg/dL (ref 65–99)
GLUCOSE-CAPILLARY: 171 mg/dL — AB (ref 65–99)
Glucose-Capillary: 145 mg/dL — ABNORMAL HIGH (ref 65–99)

## 2017-05-13 LAB — CK: Total CK: 19 U/L — ABNORMAL LOW (ref 38–234)

## 2017-05-13 LAB — PROTIME-INR
INR: 1.03
Prothrombin Time: 13.5 seconds (ref 11.4–15.2)

## 2017-05-13 SURGERY — ENDARTERECTOMY, CAROTID
Anesthesia: General | Site: Neck | Laterality: Right

## 2017-05-13 MED ORDER — FENTANYL CITRATE (PF) 100 MCG/2ML IJ SOLN
INTRAMUSCULAR | Status: AC
  Filled 2017-05-13: qty 2

## 2017-05-13 MED ORDER — EPHEDRINE SULFATE 50 MG/ML IJ SOLN
INTRAMUSCULAR | Status: DC | PRN
  Administered 2017-05-13 (×3): 10 mg via INTRAVENOUS

## 2017-05-13 MED ORDER — ONDANSETRON HCL 4 MG/2ML IJ SOLN
INTRAMUSCULAR | Status: AC
  Administered 2017-05-13: 4 mg via INTRAVENOUS
  Filled 2017-05-13: qty 2

## 2017-05-13 MED ORDER — LIDOCAINE HCL (CARDIAC) 20 MG/ML IV SOLN
INTRAVENOUS | Status: DC | PRN
  Administered 2017-05-13: 60 mg via INTRATRACHEAL

## 2017-05-13 MED ORDER — 0.9 % SODIUM CHLORIDE (POUR BTL) OPTIME
TOPICAL | Status: DC | PRN
  Administered 2017-05-13: 2000 mL

## 2017-05-13 MED ORDER — FENTANYL CITRATE (PF) 250 MCG/5ML IJ SOLN
INTRAMUSCULAR | Status: AC
  Filled 2017-05-13: qty 5

## 2017-05-13 MED ORDER — DEXAMETHASONE SODIUM PHOSPHATE 10 MG/ML IJ SOLN
INTRAMUSCULAR | Status: AC
  Filled 2017-05-13: qty 1

## 2017-05-13 MED ORDER — PHENYLEPHRINE 40 MCG/ML (10ML) SYRINGE FOR IV PUSH (FOR BLOOD PRESSURE SUPPORT)
PREFILLED_SYRINGE | INTRAVENOUS | Status: DC | PRN
  Administered 2017-05-13: 40 ug via INTRAVENOUS
  Administered 2017-05-13: 80 ug via INTRAVENOUS

## 2017-05-13 MED ORDER — SUCCINYLCHOLINE 20MG/ML (10ML) SYRINGE FOR MEDFUSION PUMP - OPTIME
INTRAMUSCULAR | Status: DC | PRN
  Administered 2017-05-13: 80 mg via INTRAVENOUS

## 2017-05-13 MED ORDER — HYDROMORPHONE HCL 1 MG/ML IJ SOLN
0.2500 mg | INTRAMUSCULAR | Status: DC | PRN
Start: 2017-05-13 — End: 2017-05-14

## 2017-05-13 MED ORDER — LACTATED RINGERS IV SOLN
INTRAVENOUS | Status: DC
  Administered 2017-05-13 – 2017-05-14 (×3): via INTRAVENOUS

## 2017-05-13 MED ORDER — DEXTROSE 5 % IV SOLN
INTRAVENOUS | Status: DC | PRN
  Administered 2017-05-13: 100 ug/min via INTRAVENOUS

## 2017-05-13 MED ORDER — IOPAMIDOL (ISOVUE-370) INJECTION 76%
INTRAVENOUS | Status: AC
  Administered 2017-05-13: 50 mL
  Filled 2017-05-13: qty 50

## 2017-05-13 MED ORDER — PROPOFOL 10 MG/ML IV BOLUS
INTRAVENOUS | Status: AC
  Filled 2017-05-13: qty 20

## 2017-05-13 MED ORDER — ROCURONIUM BROMIDE 100 MG/10ML IV SOLN
INTRAVENOUS | Status: DC | PRN
  Administered 2017-05-13: 30 mg via INTRAVENOUS
  Administered 2017-05-14: 10 mg via INTRAVENOUS

## 2017-05-13 MED ORDER — 0.9 % SODIUM CHLORIDE (POUR BTL) OPTIME
TOPICAL | Status: DC | PRN
  Administered 2017-05-13: 1000 mL

## 2017-05-13 MED ORDER — LIDOCAINE HCL (PF) 1 % IJ SOLN
INTRAMUSCULAR | Status: AC
  Filled 2017-05-13: qty 30

## 2017-05-13 MED ORDER — CEFUROXIME SODIUM 1.5 G IV SOLR
INTRAVENOUS | Status: AC
  Filled 2017-05-13: qty 1.5

## 2017-05-13 MED ORDER — CEFAZOLIN SODIUM-DEXTROSE 2-3 GM-% IV SOLR
INTRAVENOUS | Status: DC | PRN
  Administered 2017-05-13: 2 g via INTRAVENOUS

## 2017-05-13 MED ORDER — PHENYLEPHRINE 40 MCG/ML (10ML) SYRINGE FOR IV PUSH (FOR BLOOD PRESSURE SUPPORT): PREFILLED_SYRINGE | INTRAVENOUS | Status: DC | PRN

## 2017-05-13 MED ORDER — PROMETHAZINE HCL 25 MG/ML IJ SOLN: 6.2500 mg | INTRAMUSCULAR | Status: DC | PRN

## 2017-05-13 MED ORDER — SODIUM CHLORIDE 0.9 % IV SOLN
INTRAVENOUS | Status: DC | PRN
  Administered 2017-05-13: 15:00:00

## 2017-05-13 MED ORDER — DEXTRAN 40 IN SALINE 10-0.9 % IV SOLN
INTRAVENOUS | Status: AC | PRN
  Administered 2017-05-13: 500 mL/h

## 2017-05-13 MED ORDER — SUGAMMADEX SODIUM 200 MG/2ML IV SOLN
INTRAVENOUS | Status: AC
  Filled 2017-05-13: qty 2

## 2017-05-13 MED ORDER — PROTAMINE SULFATE 10 MG/ML IV SOLN
INTRAVENOUS | Status: DC | PRN
  Administered 2017-05-13: 20 mg via INTRAVENOUS
  Administered 2017-05-13: 10 mg via INTRAVENOUS
  Administered 2017-05-13: 20 mg via INTRAVENOUS

## 2017-05-13 MED ORDER — FENTANYL CITRATE (PF) 100 MCG/2ML IJ SOLN
INTRAMUSCULAR | Status: AC
  Administered 2017-05-13: 25 ug via INTRAVENOUS
  Filled 2017-05-13: qty 2

## 2017-05-13 MED ORDER — FENTANYL CITRATE (PF) 250 MCG/5ML IJ SOLN
INTRAMUSCULAR | Status: DC | PRN
  Administered 2017-05-13: 25 ug via INTRAVENOUS

## 2017-05-13 MED ORDER — CHLORHEXIDINE GLUCONATE 4 % EX LIQD: 60.0000 mL | Freq: Once | CUTANEOUS | Status: DC

## 2017-05-13 MED ORDER — FENTANYL CITRATE (PF) 100 MCG/2ML IJ SOLN
INTRAMUSCULAR | Status: AC
  Administered 2017-05-13: 50 ug via INTRAVENOUS
  Filled 2017-05-13: qty 2

## 2017-05-13 MED ORDER — ONDANSETRON HCL 4 MG/2ML IJ SOLN
4.0000 mg | Freq: Four times a day (QID) | INTRAMUSCULAR | Status: DC | PRN
  Administered 2017-05-13: 4 mg via INTRAVENOUS

## 2017-05-13 MED ORDER — MIDAZOLAM HCL 2 MG/2ML IJ SOLN
INTRAMUSCULAR | Status: AC
  Administered 2017-05-13: 0.25 mg
  Filled 2017-05-13: qty 2

## 2017-05-13 MED ORDER — ONDANSETRON HCL 4 MG/2ML IJ SOLN
INTRAMUSCULAR | Status: AC
  Filled 2017-05-13: qty 2

## 2017-05-13 MED ORDER — SODIUM CHLORIDE 0.9 % IV SOLN
INTRAVENOUS | Status: DC | PRN
  Administered 2017-05-13

## 2017-05-13 MED ORDER — DEXTROSE 5 % IV SOLN
INTRAVENOUS | Status: DC | PRN
  Administered 2017-05-13: 20 ug/min via INTRAVENOUS

## 2017-05-13 MED ORDER — LACTATED RINGERS IV SOLN
INTRAVENOUS | Status: DC | PRN
  Administered 2017-05-13 (×3): via INTRAVENOUS

## 2017-05-13 MED ORDER — HEPARIN SODIUM (PORCINE) 1000 UNIT/ML IJ SOLN
INTRAMUSCULAR | Status: DC | PRN
  Administered 2017-05-13: 2000 [IU] via INTRAVENOUS
  Administered 2017-05-13: 1000 [IU] via INTRAVENOUS
  Administered 2017-05-13: 6000 [IU] via INTRAVENOUS

## 2017-05-13 MED ORDER — LIDOCAINE 2% (20 MG/ML) 5 ML SYRINGE
INTRAMUSCULAR | Status: DC | PRN
  Administered 2017-05-13: 60 mg via INTRAVENOUS

## 2017-05-13 MED ORDER — FENTANYL CITRATE (PF) 100 MCG/2ML IJ SOLN
25.0000 ug | INTRAMUSCULAR | Status: DC | PRN
  Administered 2017-05-13: 25 ug via INTRAVENOUS
  Administered 2017-05-13: 50 ug via INTRAVENOUS
  Administered 2017-05-13: 25 ug via INTRAVENOUS
  Administered 2017-05-13: 50 ug via INTRAVENOUS

## 2017-05-13 MED ORDER — SUGAMMADEX SODIUM 200 MG/2ML IV SOLN
INTRAVENOUS | Status: DC | PRN
  Administered 2017-05-13: 130 mg via INTRAVENOUS

## 2017-05-13 MED ORDER — PROPOFOL 10 MG/ML IV BOLUS
INTRAVENOUS | Status: DC | PRN
  Administered 2017-05-13 (×2): 50 mg via INTRAVENOUS

## 2017-05-13 MED ORDER — PROPOFOL 10 MG/ML IV BOLUS
INTRAVENOUS | Status: DC | PRN
  Administered 2017-05-13: 70 mg via INTRAVENOUS

## 2017-05-13 MED ORDER — HEPARIN SODIUM (PORCINE) 1000 UNIT/ML IJ SOLN
INTRAMUSCULAR | Status: DC | PRN
  Administered 2017-05-13: 6000 [IU] via INTRAVENOUS
  Administered 2017-05-14: 1000 [IU] via INTRAVENOUS
  Administered 2017-05-14: 2000 [IU] via INTRAVENOUS

## 2017-05-13 MED ORDER — SODIUM CHLORIDE 0.9 % IV SOLN
0.0125 ug/kg/min | INTRAVENOUS | Status: DC
  Filled 2017-05-13: qty 2000

## 2017-05-13 MED ORDER — ROCURONIUM BROMIDE 10 MG/ML (PF) SYRINGE
PREFILLED_SYRINGE | INTRAVENOUS | Status: DC | PRN
  Administered 2017-05-13: 40 mg via INTRAVENOUS

## 2017-05-13 MED ORDER — SODIUM CHLORIDE 0.9 % IV SOLN: INTRAVENOUS | Status: DC

## 2017-05-13 MED ORDER — SODIUM CHLORIDE 0.9 % IV SOLN
0.0125 ug/kg/min | INTRAVENOUS | Status: AC
  Administered 2017-05-13: .05 ug/kg/min via INTRAVENOUS
  Filled 2017-05-13: qty 2000

## 2017-05-13 MED ORDER — ONDANSETRON HCL 4 MG/2ML IJ SOLN
INTRAMUSCULAR | Status: DC | PRN
  Administered 2017-05-13: 4 mg via INTRAVENOUS

## 2017-05-13 MED ORDER — DEXTROSE 5 % IV SOLN
1.5000 g | INTRAVENOUS | Status: AC
  Administered 2017-05-13: 1.5 g via INTRAVENOUS

## 2017-05-13 MED ORDER — FENTANYL CITRATE (PF) 250 MCG/5ML IJ SOLN
INTRAMUSCULAR | Status: DC | PRN
  Administered 2017-05-13: 50 ug via INTRAVENOUS

## 2017-05-13 MED ORDER — HEMOSTATIC AGENTS (NO CHARGE) OPTIME
TOPICAL | Status: DC | PRN
  Administered 2017-05-13: 1 via TOPICAL

## 2017-05-13 SURGICAL SUPPLY — 48 items
BAG DECANTER FOR FLEXI CONT (MISCELLANEOUS) ×6 IMPLANT
CANISTER SUCT 3000ML PPV (MISCELLANEOUS) ×3 IMPLANT
CANNULA VESSEL 3MM 2 BLNT TIP (CANNULA) ×12 IMPLANT
CATH EMB 2FR 60CM (CATHETERS) ×3 IMPLANT
CATH ROBINSON RED A/P 18FR (CATHETERS) ×3 IMPLANT
CRADLE DONUT ADULT HEAD (MISCELLANEOUS) ×3 IMPLANT
DERMABOND ADHESIVE PROPEN (GAUZE/BANDAGES/DRESSINGS) ×2
DERMABOND ADVANCED (GAUZE/BANDAGES/DRESSINGS) ×2
DERMABOND ADVANCED .7 DNX12 (GAUZE/BANDAGES/DRESSINGS) ×1 IMPLANT
DERMABOND ADVANCED .7 DNX6 (GAUZE/BANDAGES/DRESSINGS) ×1 IMPLANT
DRAIN CHANNEL 15F RND FF W/TCR (WOUND CARE) IMPLANT
ELECT REM PT RETURN 9FT ADLT (ELECTROSURGICAL) ×3
ELECTRODE REM PT RTRN 9FT ADLT (ELECTROSURGICAL) ×1 IMPLANT
EVACUATOR SILICONE 100CC (DRAIN) ×3 IMPLANT
GAUZE SPONGE 4X4 12PLY STRL LF (GAUZE/BANDAGES/DRESSINGS) ×3 IMPLANT
GLOVE BIO SURGEON STRL SZ7.5 (GLOVE) ×3 IMPLANT
GLOVE BIOGEL PI IND STRL 8 (GLOVE) ×1 IMPLANT
GLOVE BIOGEL PI INDICATOR 8 (GLOVE) ×2
GOWN STRL REUS W/ TWL LRG LVL3 (GOWN DISPOSABLE) ×3 IMPLANT
GOWN STRL REUS W/TWL LRG LVL3 (GOWN DISPOSABLE) ×6
KIT BASIN OR (CUSTOM PROCEDURE TRAY) ×3 IMPLANT
KIT ROOM TURNOVER OR (KITS) ×3 IMPLANT
KIT SHUNT ARGYLE CAROTID ART 6 (VASCULAR PRODUCTS) IMPLANT
NEEDLE HYPO 25GX1X1/2 BEV (NEEDLE) ×3 IMPLANT
NS IRRIG 1000ML POUR BTL (IV SOLUTION) ×6 IMPLANT
PACK CAROTID (CUSTOM PROCEDURE TRAY) ×3 IMPLANT
PAD ARMBOARD 7.5X6 YLW CONV (MISCELLANEOUS) ×6 IMPLANT
PATCH VASC XENOSURE 1CMX6CM (Vascular Products) ×2 IMPLANT
PATCH VASC XENOSURE 1X6 (Vascular Products) ×1 IMPLANT
SHUNT CAROTID BYPASS 10 (VASCULAR PRODUCTS) IMPLANT
SHUNT CAROTID BYPASS 12 (VASCULAR PRODUCTS) IMPLANT
SPONGE SURGIFOAM ABS GEL 100 (HEMOSTASIS) IMPLANT
SUT ETHILON 3 0 PS 1 (SUTURE) ×3 IMPLANT
SUT PROLENE 6 0 BV (SUTURE) ×12 IMPLANT
SUT PROLENE 7 0 BV 1 (SUTURE) IMPLANT
SUT PROLENE 7 0 BV1 MDA (SUTURE) ×3 IMPLANT
SUT SILK 2 0 FS (SUTURE) IMPLANT
SUT VIC AB 2-0 CT1 27 (SUTURE) ×2
SUT VIC AB 2-0 CT1 TAPERPNT 27 (SUTURE) ×1 IMPLANT
SUT VIC AB 3-0 SH 27 (SUTURE) ×6
SUT VIC AB 3-0 SH 27X BRD (SUTURE) ×3 IMPLANT
SUT VICRYL 4-0 PS2 18IN ABS (SUTURE) ×6 IMPLANT
SYR 20CC LL (SYRINGE) ×3 IMPLANT
SYR 3ML LL SCALE MARK (SYRINGE) ×3 IMPLANT
SYR CONTROL 10ML LL (SYRINGE) ×3 IMPLANT
SYR TB 1ML LUER SLIP (SYRINGE) ×3 IMPLANT
TAPE CLOTH SURG 4X10 WHT LF (GAUZE/BANDAGES/DRESSINGS) ×3 IMPLANT
WATER STERILE IRR 1000ML POUR (IV SOLUTION) ×3 IMPLANT

## 2017-05-13 SURGICAL SUPPLY — 53 items
CANISTER SUCT 3000ML PPV (MISCELLANEOUS) ×3 IMPLANT
CATH ROBINSON RED A/P 18FR (CATHETERS) ×3 IMPLANT
CATH SUCT 10FR WHISTLE TIP (CATHETERS) ×3 IMPLANT
CLIP TI MEDIUM 6 (CLIP) ×3 IMPLANT
CLIP TI WIDE RED SMALL 6 (CLIP) ×3 IMPLANT
COVER PROBE W GEL 5X96 (DRAPES) IMPLANT
CRADLE DONUT ADULT HEAD (MISCELLANEOUS) ×3 IMPLANT
DERMABOND ADVANCED (GAUZE/BANDAGES/DRESSINGS) ×2
DERMABOND ADVANCED .7 DNX12 (GAUZE/BANDAGES/DRESSINGS) ×1 IMPLANT
DRAIN CHANNEL 15F RND FF W/TCR (WOUND CARE) ×3 IMPLANT
ELECT REM PT RETURN 9FT ADLT (ELECTROSURGICAL) ×3
ELECTRODE REM PT RTRN 9FT ADLT (ELECTROSURGICAL) ×1 IMPLANT
EVACUATOR SILICONE 100CC (DRAIN) ×3 IMPLANT
GLOVE BIO SURGEON STRL SZ 6 (GLOVE) ×3 IMPLANT
GLOVE BIO SURGEON STRL SZ 6.5 (GLOVE) ×4 IMPLANT
GLOVE BIO SURGEONS STRL SZ 6.5 (GLOVE) ×2
GLOVE BIOGEL PI IND STRL 6 (GLOVE) ×1 IMPLANT
GLOVE BIOGEL PI IND STRL 6.5 (GLOVE) ×1 IMPLANT
GLOVE BIOGEL PI IND STRL 7.0 (GLOVE) ×2 IMPLANT
GLOVE BIOGEL PI IND STRL 7.5 (GLOVE) ×1 IMPLANT
GLOVE BIOGEL PI INDICATOR 6 (GLOVE) ×2
GLOVE BIOGEL PI INDICATOR 6.5 (GLOVE) ×2
GLOVE BIOGEL PI INDICATOR 7.0 (GLOVE) ×4
GLOVE BIOGEL PI INDICATOR 7.5 (GLOVE) ×2
GLOVE SURG SS PI 7.5 STRL IVOR (GLOVE) ×3 IMPLANT
GOWN STRL REUS W/ TWL LRG LVL3 (GOWN DISPOSABLE) ×5 IMPLANT
GOWN STRL REUS W/ TWL XL LVL3 (GOWN DISPOSABLE) ×1 IMPLANT
GOWN STRL REUS W/TWL LRG LVL3 (GOWN DISPOSABLE) ×10
GOWN STRL REUS W/TWL XL LVL3 (GOWN DISPOSABLE) ×2
HEMOSTAT SNOW SURGICEL 2X4 (HEMOSTASIS) ×3 IMPLANT
INSERT FOGARTY SM (MISCELLANEOUS) IMPLANT
KIT BASIN OR (CUSTOM PROCEDURE TRAY) ×3 IMPLANT
KIT ROOM TURNOVER OR (KITS) ×3 IMPLANT
KIT SHUNT ARGYLE CAROTID ART 6 (VASCULAR PRODUCTS) IMPLANT
NEEDLE HYPO 25GX1X1/2 BEV (NEEDLE) IMPLANT
NS IRRIG 1000ML POUR BTL (IV SOLUTION) ×9 IMPLANT
PACK CAROTID (CUSTOM PROCEDURE TRAY) ×3 IMPLANT
PAD ARMBOARD 7.5X6 YLW CONV (MISCELLANEOUS) ×6 IMPLANT
PATCH VASC XENOSURE 1CMX6CM (Vascular Products) ×2 IMPLANT
PATCH VASC XENOSURE 1X6 (Vascular Products) ×1 IMPLANT
SHUNT CAROTID BYPASS 10 (VASCULAR PRODUCTS) IMPLANT
SHUNT CAROTID BYPASS 12FRX15.5 (VASCULAR PRODUCTS) IMPLANT
SUT ETHILON 3 0 PS 1 (SUTURE) ×3 IMPLANT
SUT PROLENE 6 0 BV (SUTURE) ×24 IMPLANT
SUT PROLENE 7 0 BV 1 (SUTURE) ×3 IMPLANT
SUT SILK 3 0 (SUTURE)
SUT SILK 3-0 18XBRD TIE 12 (SUTURE) IMPLANT
SUT VIC AB 3-0 SH 27 (SUTURE) ×4
SUT VIC AB 3-0 SH 27X BRD (SUTURE) ×2 IMPLANT
SUT VIC AB 4-0 PS2 27 (SUTURE) ×3 IMPLANT
SUT VICRYL 4-0 PS2 18IN ABS (SUTURE) ×3 IMPLANT
SYR CONTROL 10ML LL (SYRINGE) IMPLANT
WATER STERILE IRR 1000ML POUR (IV SOLUTION) ×3 IMPLANT

## 2017-05-13 NOTE — Anesthesia Procedure Notes (Signed)
Procedure Name: Intubation Date/Time: 05/13/2017 11:09 PM Performed by: Valetta Fuller Pre-anesthesia Checklist: Patient identified, Emergency Drugs available, Suction available and Patient being monitored Patient Re-evaluated:Patient Re-evaluated prior to inductionOxygen Delivery Method: Circle system utilized Preoxygenation: Pre-oxygenation with 100% oxygen Intubation Type: IV induction Laryngoscope Size: Miller and 2 Grade View: Grade II Tube type: Subglottic suction tube Tube size: 7.5 mm Number of attempts: 1 Airway Equipment and Method: Stylet Placement Confirmation: ETT inserted through vocal cords under direct vision,  positive ETCO2 and breath sounds checked- equal and bilateral Secured at: 22 cm Tube secured with: Tape Dental Injury: Teeth and Oropharynx as per pre-operative assessment

## 2017-05-13 NOTE — Interval H&P Note (Signed)
History and Physical Interval Note:  05/13/2017 1:12 PM  Kylie Sanchez  has presented today for surgery, with the diagnosis of Right Internal Carotid Artery Stenosis I65.21  The various methods of treatment have been discussed with the patient and family. After consideration of risks, benefits and other options for treatment, the patient has consented to  Procedure(s): ENDARTERECTOMY CAROTID-RIGHT (Right) as a surgical intervention .  The patient's history has been reviewed, patient examined, no change in status, stable for surgery.  I have reviewed the patient's chart and labs.  Questions were answered to the patient's satisfaction.     [name]

## 2017-05-13 NOTE — Anesthesia Postprocedure Evaluation (Signed)
Anesthesia Post Note  Patient: Kylie Sanchez  Procedure(s) Performed: Procedure(s) (LRB): ENDARTERECTOMY CAROTID-RIGHT (Right) PATCH ANGIOPLASTY USING XENOSURE BIOLOGIC PATCH (Right)     Patient location during evaluation: PACU Anesthesia Type: General Level of consciousness: awake and alert Pain management: pain level controlled Vital Signs Assessment: post-procedure vital signs reviewed and stable Respiratory status: spontaneous breathing, nonlabored ventilation, respiratory function stable and patient connected to nasal cannula oxygen Cardiovascular status: blood pressure returned to baseline and stable Postop Assessment: no signs of nausea or vomiting Anesthetic complications: no    Last Vitals:  Vitals:   05/13/17 1945 05/13/17 1958  BP: (!) 133/55 (!) 130/51  Pulse: 83 78  Resp: 16 (!) 9  Temp:      Last Pain:  Vitals:   05/13/17 2000  TempSrc:   PainSc: 3                  Arthur Aydelotte S

## 2017-05-13 NOTE — Progress Notes (Signed)
Pt stated on the way to CT scan "my face has been drooping since my eye surgery". Pt nephew came to bedside earlier in shift (2020) and stated her face did not look like this before. Dr. Russella Dar was notified of facial droop via on call [place] [phone]) and stated he did not notice it pre-op. Continued with CT scan.

## 2017-05-13 NOTE — Anesthesia Procedure Notes (Signed)
Arterial Line Insertion Start/End7/01/2017 12:15 PM Performed by: Rica Koyanagi, anesthesiologist  Patient location: Pre-op. Preanesthetic checklist: patient identified, IV checked, site marked, risks and benefits discussed, surgical consent, monitors and equipment checked, pre-op evaluation and anesthesia consent Lidocaine 1% used for infiltration Right, radial was placed Catheter size: 20 G Hand hygiene performed  and maximum sterile barriers used   Attempts: 3 Procedure performed without using ultrasound guided technique. Ultrasound Notes:anatomy identified and no ultrasound evidence of intravascular and/or intraneural injection Following insertion, dressing applied and Biopatch. Post procedure assessment: decreased circulation  Patient tolerated the procedure well with no immediate complications.

## 2017-05-13 NOTE — Progress Notes (Signed)
VASCULAR SURGERY  CT scan shows an abrupt occlusion of the internal carotid artery at the distal end of the patch. She continues to have some facial droop although she states that she has had some facial drooping since her eye surgery. However, I'm also concerned that she now has some clumsiness and slight weakness in her left upper extremity. I discussed the case with Dr. Trula Slade and we will take the patient back to the OR for exploration urgently. I have updated the nephew Barnabas Lister [phone]).  Deitra Mayo, MD, [place] [phone] Office: [phone]

## 2017-05-13 NOTE — Anesthesia Preprocedure Evaluation (Signed)
Anesthesia Evaluation  Patient identified by MRN, date of birth, ID band Patient awake    Reviewed: Allergy & Precautions, NPO status , Patient's Chart, lab work & pertinent test results  Airway Mallampati: II  TM Distance: >3 FB Neck ROM: Full    Dental no notable dental hx.    Pulmonary neg pulmonary ROS,    Pulmonary exam normal breath sounds clear to auscultation       Cardiovascular hypertension, Normal cardiovascular exam+ pacemaker  Rhythm:Regular Rate:Normal     Neuro/Psych negative neurological ROS  negative psych ROS   GI/Hepatic negative GI ROS, Neg liver ROS,   Endo/Other  diabetes  Renal/GU Renal InsufficiencyRenal disease  negative genitourinary   Musculoskeletal negative musculoskeletal ROS (+)   Abdominal   Peds negative pediatric ROS (+)  Hematology negative hematology ROS (+)   Anesthesia Other Findings   Reproductive/Obstetrics negative OB ROS                             Anesthesia Physical Anesthesia Plan  ASA: III  Anesthesia Plan: General   Post-op Pain Management:    Induction: Intravenous  PONV Risk Score and Plan:   Airway Management Planned: Oral ETT  Additional Equipment:   Intra-op Plan:   Post-operative Plan: Possible Post-op intubation/ventilation  Informed Consent: I have reviewed the patients History and Physical, chart, labs and discussed the procedure including the risks, benefits and alternatives for the proposed anesthesia with the patient or authorized representative who has indicated his/her understanding and acceptance.   Dental advisory given  Plan Discussed with: CRNA and Surgeon  Anesthesia Plan Comments:         Anesthesia Quick Evaluation

## 2017-05-13 NOTE — H&P (View-Only) (Signed)
Hospital Consult    Reason for Consult:  Carotid artery stenosis Requesting Physician:  Wendee Beavers MRN #:  [id number]  History of Present Illness: This is a 83 y.o. female who presented to the ER a couple days ago b/c she sat down and thinks she lost consciousness for 3-4 seconds. She was holding her phone at that time time and when she "came to" she was still holding her phone.  She felt weak overall and scared and called 911.  She denies any chest pain or shortness of breath. She denies having dizziness or prior history of dizziness.   When she arrived at the ER, she was noted to have tachycardia and her heart rate had gotten as high as 175 with Afib with RVR.  She was given a dose of metoprolol and she converted back to NSR.  She does have a Medtronic PPM, which was interrogated and revealed a sinus arrhythmia.  During her hospitalization, she had a carotid duplex, which revealed an 80-99% right carotid artery stenosis and vascular surgery is consulted.   She has retinopathy. Her right eye vision has gotten progressively worse over the years. 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 She denies any episodes of amaurosis fugax, sudden weakness or numbness of the extremities, receptive or expressive aphasia.   She has a hx of diabetes and takes Metformin.  She takes a CCB and ARB for hypertension.   She has been placed on a beta blocker here in the hospital.  She is on coumadin for Afib.  She has hx of CKD 3.  She denies every having chest pain. She has no history of TIA or stroke. She has never been a smoker.   She lives alone in an apartment and has been preparing her meals daily.    Past Medical History:  Diagnosis Date  . Cataract   . CKD (chronic kidney disease) stage 3, GFR 30-59 ml/min    due to DM  . Dizziness   . Hyperlipidemia   . Hypertensive cardiovascular disease   . Paroxysmal atrial fibrillation (HCC)   . Retinopathy   . Sick sinus syndrome ([place])    . Type II or unspecified type diabetes mellitus without mention of complication, not stated as uncontrolled     Past Surgical History:  Procedure Laterality Date  . DENTAL SURGERY    . PACEMAKER INSERTION  04/20/12   MDT Adapta L implanted by Dr Rayann Heman  . PERMANENT PACEMAKER INSERTION N/A 04/20/2012   Procedure: PERMANENT PACEMAKER INSERTION;  Surgeon: Thompson Grayer, MD;  Location: [facility] C[facility] LAB;  Service: Cardiovascular;  Laterality: N/A;    No Known Allergies  Prior to Admission medications   Medication Sig Start Date End Date Taking? Authorizing Provider  amLODipine (NORVASC) 10 MG tablet TAKE ONE (1) TABLET BY MOUTH EVERY DAY (OVERDUE FOR AN APPTMT. CALL TO SCHEDULE) 03/26/17  Yes Allred, Jeneen Rinks, MD  b complex vitamins tablet Take 1 tablet by mouth daily.   Yes [provider]  Cholecalciferol (VITAMIN D PO) Take 1 tablet by mouth.   Yes [provider]  Coenzyme Q10 (CO Q 10) 100 MG CAPS Take 100 mg by mouth daily.    Yes [provider]  losartan (COZAAR) 100 MG tablet TAKE ONE (1) TABLET BY MOUTH EVERY DAY *MUST MAKE APPOINTMENT FOR FURTHER REFILLS* 03/26/17  Yes Vicie Mutters, PA-C  Magnesium 250 MG TABS Take 250 mg by mouth 2 (two) times daily.  Yes [provider]  metFORMIN (GLUCOPHAGE) 500 MG tablet TAKE 2 TABLETS(1000 MG) BY MOUTH TWICE DAILY 09/18/16  Yes Vicie Mutters, PA-C  metoprolol (TOPROL-XL) 200 MG 24 hr tablet TAKE ONE (1) TABLET BY MOUTH EVERY DAY Patient taking differently: TAKE HALF (0.5) A TABLET BY MOUTH TWICE A DAY 02/17/17  Yes Unk Pinto, MD  nitroGLYCERIN (NITROSTAT) 0.4 MG SL tablet Place 1 tablet (0.4 mg total) under the tongue every 5 (five) minutes as needed for chest pain. 05/08/16 05/08/17 Yes Vicie Mutters, PA-C  warfarin (COUMADIN) 5 MG tablet TAKE AS DIRECTED BY COUMADIN CLINIC 02/04/17  Yes Forcucci, Courtney, PA-C  warfarin (COUMADIN) 5 MG tablet Take 2.5-5 mg by mouth daily. Per blood work last week. Pt takes  1 tab (5 mg) on Monday, Wednesday, Friday and 0.5 tab (2.5 mg) on Tuesday, Thursday    Yes [provider]    Social History   Social History  . Marital status: Single    Spouse name: N/A  . Number of children: N/A  . Years of education: N/A   Occupational History  . Not on file.   Social History Main Topics  . Smoking status: Never Smoker  . Smokeless tobacco: Never Used  . Alcohol use No  . Drug use: No  . Sexual activity: Not on file   Other Topics Concern  . Not on file   Social History Narrative  . No narrative on file     Family History  Problem Relation Age of Onset  . Heart attack Father   . Hypertension Mother   . Cancer Other     ROS: [x]  Positive   [ ]  Negative   [ ]  All sytems reviewed and are negative Cardiac: []  chest pain/pressure []  palpitations []  SOB lying flat []  DOE [x]  hx SSS with PPM [x]  PAF [x]  syncope  Vascular: []  pain in legs while walking []  pain in legs at rest []  pain in legs at night []  non-healing ulcers []  hx of DVT []  swelling in legs  Pulmonary: []  productive cough []  asthma/wheezing []  home O2  Neurologic: []  weakness in []  arms []  legs []  numbness in []  arms []  legs []  hx of CVA []  mini stroke [] difficulty speaking or slurred speech []  temporary loss of vision in one eye []    HEENT: [x]  left eye enucleation d/t BCC  Hematologic: []  hx of cancer []  bleeding problems []  problems with blood clotting easily  Endocrine:   [x]  diabetes []  thyroid disease  GI []  vomiting blood []  blood in stool  GU: [x]  CKD/renal failure []  HD--[]  M/W/F or []  T/T/S []  burning with urination []  blood in urine  Psychiatric: []  anxiety []  depression  Musculoskeletal: []  arthritis []  joint pain  Integumentary: []  rashes []  ulcers  Constitutional: []  fever []  chills   Physical Examination  Vitals:   04/18/17 0404 04/18/17 0941  BP: (!) 145/57 (!) 154/69  Pulse: 66 72  Resp: 18   Temp: 97.9 F  (36.6 C) 98.1 F (36.7 C)   Body mass index is 25.61 kg/m.  General:  WDWN in NAD Gait: Not observed HENT: WNL, normocephalic, left eye surgically absent  Pulmonary: normal non-labored breathing, without Rales, rhonchi,  wheezing Cardiac: regular, without  Murmurs, rubs or gallops; without carotid bruits Skin: without rashes Vascular Exam/Pulses: Weak radial pulses. Extremities: without ischemic changes, without Gangrene , without cellulitis; without open wounds;  Musculoskeletal: no muscle wasting or atrophy  Neurologic: A&O X 3;  No focal weakness  or paresthesias are detected; speech is fluent/normal Psychiatric:  The pt has Normal affect.  CBC    Component Value Date/Time   WBC 6.2 04/18/2017 0330   RBC 3.47 (L) 04/18/2017 0330   HGB 10.0 (L) 04/18/2017 0330   HCT 31.4 (L) 04/18/2017 0330   PLT 259 04/18/2017 0330   MCV 90.5 04/18/2017 0330   MCH 28.8 04/18/2017 0330   MCHC 31.8 04/18/2017 0330   RDW 13.0 04/18/2017 0330   LYMPHSABS 1,980 04/09/2017 1226   MONOABS 420 04/09/2017 1226   EOSABS 240 04/09/2017 1226   BASOSABS 60 04/09/2017 1226    BMET    Component Value Date/Time   NA 137 04/16/2017 1530   K 4.2 04/16/2017 1530   CL 104 04/16/2017 1530   CO2 22 04/16/2017 1530   GLUCOSE 143 (H) 04/16/2017 1530   BUN 17 04/16/2017 1530   CREATININE 1.00 04/16/2017 1530   CREATININE 1.06 (H) 04/09/2017 1226   CALCIUM 9.8 04/16/2017 1530   GFRNONAA 48 (L) 04/16/2017 1530   GFRNONAA 47 (L) 04/09/2017 1226   GFRAA 56 (L) 04/16/2017 1530   GFRAA 54 (L) 04/09/2017 1226    COAGS: Lab Results  Component Value Date   INR 1.62 04/18/2017   INR 1.32 04/17/2017   INR 1.26 04/16/2017     Non-Invasive Vascular Imaging:   Carotid Duplex (Doppler) 04/18/17:  Preliminary findings: Right 80-99% ICA stenosis. Left 1-39% ICA stenosis. Antegrade vertebral flow.  CT Head without contrast 04/17/17: IMPRESSION: 1. Chronic microvascular ischemia findings without  acute intracranial abnormality. 2. Remote left eye enucleation  2D Echo pending   Statin:  No. Beta Blocker:  Yes.   Aspirin:  No. ACEI:  No. ARB:  Yes.   CCB use:  Yes Other antiplatelets/anticoagulants:  Yes.   coumadin    ASSESSMENT/PLAN: This is a 83 y.o. female with right carotid artery stenosis  -Carotid duplex reveals 80-99% stenosis of the right ICA.  No TIA or stroke symptoms.  -Pt is not currently on aspirin (on coumadin) -pt is on coumadin for Afib with INR today of 1.6 -hx SSS with Medtronic PPM -patient is very hesitant towards having a procedure as she lives alone and does not have a lot of family around. -Dr. Trula Slade to see patient this afternoon.   Virgina Jock, PA-C [facility] [phone]   I agree with the above.  I have seen and examined the patient.  I believe her near syncopal event was not related to her carotid stenosis, and that this was an incidental finding.   We discussed the treatment options of medical management vs surgical endarterectomy.  I would not recommend stenting due to her age.  She would like to proceed with right CEA.  I would like for her to be discharged when appropriate and recover more from her event, with plans for right CEA in 2-3 weeks.  We discussed the details of the surgery including but not limited to the risk of stroke, nerve injury, cardio-pulmonary complications, and bleeding.  I would like to make sure cardiology clears her for surgery.  We will stop her coumadin 5 days prior to surgery.  She needs to start an ASA 81mg .  [name]

## 2017-05-13 NOTE — Anesthesia Preprocedure Evaluation (Addendum)
Anesthesia Evaluation  Patient identified by MRN, date of birth, ID band Patient awake    Reviewed: Allergy & Precautions, NPO status , Patient's Chart, lab work & pertinent test results  Airway Mallampati: II  TM Distance: >3 FB Neck ROM: Limited    Dental  (+) Dental Advisory Given, Chipped, Missing, Poor Dentition,    Pulmonary neg pulmonary ROS,    breath sounds clear to auscultation       Cardiovascular hypertension, + dysrhythmias Atrial Fibrillation + pacemaker  Rhythm:Irregular Rate:Normal     Neuro/Psych negative neurological ROS     GI/Hepatic   Endo/Other  diabetes, Poorly Controlled  Renal/GU Renal disease     Musculoskeletal   Abdominal   Peds  Hematology   Anesthesia Other Findings   Reproductive/Obstetrics                           Anesthesia Physical Anesthesia Plan  ASA: IV  Anesthesia Plan: General   Post-op Pain Management:    Induction: Intravenous  PONV Risk Score and Plan: 2 and Ondansetron and Dexamethasone  Airway Management Planned: Oral ETT  Additional Equipment:   Intra-op Plan: Utilization Of Total Body Hypothermia per surgeon request  Post-operative Plan: Extubation in OR and Possible Post-op intubation/ventilation  Informed Consent: I have reviewed the patients History and Physical, chart, labs and discussed the procedure including the risks, benefits and alternatives for the proposed anesthesia with the patient or authorized representative who has indicated his/her understanding and acceptance.   Dental advisory given  Plan Discussed with: CRNA  Anesthesia Plan Comments: (Plan discussed c daughter  And patient )       Anesthesia Quick Evaluation

## 2017-05-13 NOTE — Anesthesia Procedure Notes (Addendum)
Procedure Name: Intubation Date/Time: 05/13/2017 3:32 PM Performed by: Trixie Deis A Pre-anesthesia Checklist: Patient identified, Emergency Drugs available, Suction available and Patient being monitored Patient Re-evaluated:Patient Re-evaluated prior to inductionOxygen Delivery Method: Circle system utilized Preoxygenation: Pre-oxygenation with 100% oxygen Intubation Type: IV induction Ventilation: Mask ventilation without difficulty Grade View: Grade I Tube type: Oral Tube size: 7.0 mm Number of attempts: 1 Airway Equipment and Method: Stylet and Oral airway Placement Confirmation: ETT inserted through vocal cords under direct vision,  positive ETCO2 and breath sounds checked- equal and bilateral Secured at: 22 cm Tube secured with: Tape Dental Injury: Teeth and Oropharynx as per pre-operative assessment

## 2017-05-13 NOTE — Progress Notes (Signed)
   VASCULAR SURGERY:   Called to see patient because of left facial droop. According to family this is new. Spoke with Dr. Trula Slade. This is new so will proceed with stat head CT and also get CT angio of neck as unable to get duplex at this hour. If CT negative for bleed, and CEA OK,consider heparin. If any change in exam would need to consider exploration although in this fragile 83 YO this would be associated with significant risk. Tried to find family, they were not in waiting room.    SUBJECTIVE:   No specific complaints.   PHYSICAL EXAM:   Vitals:   05/13/17 1930 05/13/17 1943 05/13/17 1945 05/13/17 1958  BP: (!) 135/53  (!) 133/55 (!) 130/51  Pulse: 86 82 83 78  Resp: 15 13 16  (!) 9  Temp:      TempSrc:      SpO2: 94% 97% 99% 99%  Weight:      Height:       Left facial droop. Good EU strength and LE strength  LABS:   Lab Results  Component Value Date   WBC 4.5 05/07/2017   HGB 10.5 (L) 05/13/2017   HCT 31.0 (L) 05/13/2017   MCV 92.9 05/07/2017   PLT 344 05/07/2017   Lab Results  Component Value Date   CREATININE 1.21 (H) 05/07/2017   Lab Results  Component Value Date   INR 1.03 05/13/2017   CBG (last 3)   Recent Labs  05/13/17 1349 05/13/17 1834  GLUCAP 91 145*    PROBLEM LIST:    Active Problems:   Asymptomatic stenosis of right carotid artery   CURRENT MEDS:   . chlorhexidine  60 mL Topical Once   And  . [START ON 05/14/2017] chlorhexidine  60 mL Topical Once  . fentaNYL        Gae Gallop Beeper: [phone] Office: [phone] 05/13/2017

## 2017-05-13 NOTE — Progress Notes (Addendum)
Upon arrival to PACU, RN assessed left-sided facial droop. Kylie Sanchez, [place] and Dr. Trula Slade paged. Called Dr. Stephens Shire office and on-call MD, Dr. Scot Dock to bedside to assess patient. No new orders received.

## 2017-05-13 NOTE — Significant Event (Signed)
Rapid Response Event Note  Overview: Called by PACU RN d/t patient having L sided droop and slurred speech. Time Called: 1925 Arrival Time: 1930 Event Type: Neurologic  Initial Focused Assessment: Pt laying in bed, drowsy-awakens to voice, alert and oriented X4.  CBG-145, NIH-4 for drowsiness, L sided droop, and slurred speech.  HR-83, BP-130/51, RR-21, SpO2-98% on 2L [place].Pt's nephew confirmed droop not present prior to surgery.   Interventions:  Dr. Mena Pauls) paged and confirmed pt not a TPA candidate and recommended CT head and if no bleed seen on CT, then MRI brain to r/o stroke.  Plan of Care (if not transferred): Monitor pt. CT head.  Possible MRI brain pending CT head result.  Call RRT if further assistance needed.  Event Summary: Name of Physician Notified: Dr. Scot Dock and Dr. Trula Slade at  (PTA RRT)  Name of Consulting Physician Notified: Dr. Paschal Dopp at 2015  Outcome: Stayed in room and stabalized (PACU)  Event End Time: 2017  Dillard Essex

## 2017-05-13 NOTE — Transfer of Care (Signed)
Immediate Anesthesia Transfer of Care Note  Patient: Kylie Sanchez  Procedure(s) Performed: Procedure(s): ENDARTERECTOMY CAROTID-RIGHT (Right) PATCH ANGIOPLASTY USING XENOSURE BIOLOGIC PATCH (Right)  Patient Location: PACU  Anesthesia Type:General  Level of Consciousness: awake, alert  and oriented  Airway & Oxygen Therapy: Patient Spontanous Breathing and Patient connected to nasal cannula oxygen  Post-op Assessment: Report given to RN, Post -op Vital signs reviewed and stable and Patient moving all extremities  Post vital signs: Reviewed and stable  Last Vitals:  Vitals:   05/13/17 1125 05/13/17 1830  BP: (!) 136/48 (!) 127/52  Pulse: 68 90  Resp: 18 (P) 16  Temp: 36.9 C 36.4 C    Last Pain:  Vitals:   05/13/17 1125  TempSrc: Oral      Patients Stated Pain Goal: 2 (21/74/71 5953)  Complications: No apparent anesthesia complications

## 2017-05-14 ENCOUNTER — Encounter (HOSPITAL_COMMUNITY): Payer: Self-pay | Admitting: Vascular Surgery

## 2017-05-14 LAB — TROPONIN I
TROPONIN I: 0.05 ng/mL — AB (ref <0.03)
TROPONIN I: 0.05 ng/mL — AB (ref <0.03)

## 2017-05-14 LAB — GLUCOSE, CAPILLARY
GLUCOSE-CAPILLARY: 162 mg/dL — AB (ref 65–99)
GLUCOSE-CAPILLARY: 130 mg/dL — AB (ref 65–99)
GLUCOSE-CAPILLARY: 131 mg/dL — AB (ref 65–99)
Glucose-Capillary: 174 mg/dL — ABNORMAL HIGH (ref 65–99)

## 2017-05-14 LAB — CK TOTAL AND CKMB (NOT AT ARMC)
CK TOTAL: 47 U/L (ref 38–234)
CK, MB: 2.7 ng/mL (ref 0.5–5.0)
Relative Index: INVALID (ref 0.0–2.5)

## 2017-05-14 LAB — BASIC METABOLIC PANEL
Anion gap: 7 (ref 5–15)
BUN: 22 mg/dL — ABNORMAL HIGH (ref 6–20)
CHLORIDE: 110 mmol/L (ref 101–111)
CO2: 20 mmol/L — ABNORMAL LOW (ref 22–32)
CREATININE: 1.07 mg/dL — AB (ref 0.44–1.00)
Calcium: 8.3 mg/dL — ABNORMAL LOW (ref 8.9–10.3)
GFR calc non Af Amer: 45 mL/min — ABNORMAL LOW (ref >60)
GFR, EST AFRICAN AMERICAN: 52 mL/min — AB (ref >60)
Glucose, Bld: 175 mg/dL — ABNORMAL HIGH (ref 65–99)
POTASSIUM: 5.1 mmol/L (ref 3.5–5.1)
SODIUM: 137 mmol/L (ref 135–145)

## 2017-05-14 LAB — CBC
HCT: 26.2 % — ABNORMAL LOW (ref 36.0–46.0)
HEMOGLOBIN: 8.5 g/dL — AB (ref 12.0–15.0)
MCH: 29.7 pg (ref 26.0–34.0)
MCHC: 32.4 g/dL (ref 30.0–36.0)
MCV: 91.6 fL (ref 78.0–100.0)
Platelets: 221 10*3/uL (ref 150–400)
RBC: 2.86 MIL/uL — AB (ref 3.87–5.11)
RDW: 12.8 % (ref 11.5–15.5)
WBC: 9.2 10*3/uL (ref 4.0–10.5)

## 2017-05-14 MED ORDER — AMLODIPINE BESYLATE 10 MG PO TABS
10.0000 mg | ORAL_TABLET | Freq: Every day | ORAL | Status: DC
  Administered 2017-05-14 – 2017-05-15 (×2): 10 mg via ORAL
  Filled 2017-05-14 (×2): qty 1

## 2017-05-14 MED ORDER — ORAL CARE MOUTH RINSE
15.0000 mL | Freq: Two times a day (BID) | OROMUCOSAL | Status: DC
  Administered 2017-05-14 – 2017-05-15 (×4): 15 mL via OROMUCOSAL

## 2017-05-14 MED ORDER — GLIMEPIRIDE 1 MG PO TABS
1.0000 mg | ORAL_TABLET | Freq: Every day | ORAL | Status: DC
  Administered 2017-05-15 – 2017-05-26 (×12): 1 mg via ORAL
  Filled 2017-05-14 (×13): qty 1

## 2017-05-14 MED ORDER — LABETALOL HCL 5 MG/ML IV SOLN: 10.0000 mg | INTRAVENOUS | Status: DC | PRN

## 2017-05-14 MED ORDER — GUAIFENESIN-DM 100-10 MG/5ML PO SYRP: 15.0000 mL | ORAL_SOLUTION | ORAL | Status: DC | PRN

## 2017-05-14 MED ORDER — MAGNESIUM SULFATE 2 GM/50ML IV SOLN: 2.0000 g | Freq: Every day | INTRAVENOUS | Status: DC | PRN

## 2017-05-14 MED ORDER — ONDANSETRON HCL 4 MG/2ML IJ SOLN
INTRAMUSCULAR | Status: AC
  Filled 2017-05-14: qty 2

## 2017-05-14 MED ORDER — METFORMIN HCL ER 500 MG PO TB24
500.0000 mg | ORAL_TABLET | Freq: Every day | ORAL | Status: DC
  Administered 2017-05-15 – 2017-05-20 (×6): 500 mg via ORAL
  Filled 2017-05-14 (×7): qty 1

## 2017-05-14 MED ORDER — PROTAMINE SULFATE 10 MG/ML IV SOLN
INTRAVENOUS | Status: DC | PRN
  Administered 2017-05-14: 50 mg via INTRAVENOUS

## 2017-05-14 MED ORDER — ASPIRIN EC 325 MG PO TBEC
325.0000 mg | DELAYED_RELEASE_TABLET | Freq: Every day | ORAL | Status: DC
  Administered 2017-05-14 – 2017-05-19 (×5): 325 mg via ORAL
  Filled 2017-05-14 (×6): qty 1

## 2017-05-14 MED ORDER — MORPHINE SULFATE (PF) 2 MG/ML IV SOLN
1.0000 mg | INTRAVENOUS | Status: DC | PRN
  Administered 2017-05-15: 2 mg via INTRAVENOUS
  Filled 2017-05-14: qty 1

## 2017-05-14 MED ORDER — CEFAZOLIN SODIUM 1 G IJ SOLR
INTRAMUSCULAR | Status: AC
  Filled 2017-05-14: qty 20

## 2017-05-14 MED ORDER — HYDRALAZINE HCL 20 MG/ML IJ SOLN: 5.0000 mg | INTRAMUSCULAR | Status: DC | PRN

## 2017-05-14 MED ORDER — RESOURCE THICKENUP CLEAR PO POWD
Freq: Once | ORAL | Status: DC
  Filled 2017-05-14 (×2): qty 125

## 2017-05-14 MED ORDER — METOPROLOL TARTRATE 5 MG/5ML IV SOLN: 2.0000 mg | INTRAVENOUS | Status: DC | PRN

## 2017-05-14 MED ORDER — OXYCODONE-ACETAMINOPHEN 5-325 MG PO TABS: 1.0000 | ORAL_TABLET | ORAL | Status: DC | PRN

## 2017-05-14 MED ORDER — LISINOPRIL 20 MG PO TABS
40.0000 mg | ORAL_TABLET | Freq: Every day | ORAL | Status: DC
  Administered 2017-05-14 – 2017-05-15 (×2): 40 mg via ORAL
  Filled 2017-05-14 (×2): qty 2

## 2017-05-14 MED ORDER — HEMOSTATIC AGENTS (NO CHARGE) OPTIME
TOPICAL | Status: DC | PRN
  Administered 2017-05-14: 1 via TOPICAL

## 2017-05-14 MED ORDER — PHENOL 1.4 % MT LIQD
1.0000 | OROMUCOSAL | Status: DC | PRN
  Filled 2017-05-14: qty 177

## 2017-05-14 MED ORDER — SODIUM CHLORIDE 0.9 % IV SOLN: 500.0000 mL | Freq: Once | INTRAVENOUS | Status: DC | PRN

## 2017-05-14 MED ORDER — INSULIN ASPART 100 UNIT/ML ~~LOC~~ SOLN
0.0000 [IU] | Freq: Three times a day (TID) | SUBCUTANEOUS | Status: DC
  Administered 2017-05-14 (×2): 2 [IU] via SUBCUTANEOUS
  Administered 2017-05-14 – 2017-05-17 (×6): 1 [IU] via SUBCUTANEOUS
  Administered 2017-05-18 (×3): 2 [IU] via SUBCUTANEOUS
  Administered 2017-05-19: 1 [IU] via SUBCUTANEOUS
  Administered 2017-05-19: 2 [IU] via SUBCUTANEOUS
  Administered 2017-05-20 – 2017-05-22 (×3): 1 [IU] via SUBCUTANEOUS
  Administered 2017-05-24: 2 [IU] via SUBCUTANEOUS
  Administered 2017-05-24: 1 [IU] via SUBCUTANEOUS
  Administered 2017-05-25 – 2017-05-26 (×3): 2 [IU] via SUBCUTANEOUS
  Administered 2017-05-26: 1 [IU] via SUBCUTANEOUS

## 2017-05-14 MED ORDER — DEXTROSE 5 % IV SOLN
1.5000 g | Freq: Two times a day (BID) | INTRAVENOUS | Status: AC
  Administered 2017-05-14 (×2): 1.5 g via INTRAVENOUS
  Filled 2017-05-14 (×2): qty 1.5

## 2017-05-14 MED ORDER — DOCUSATE SODIUM 100 MG PO CAPS
100.0000 mg | ORAL_CAPSULE | Freq: Every day | ORAL | Status: DC
  Administered 2017-05-14 – 2017-05-26 (×12): 100 mg via ORAL
  Filled 2017-05-14 (×13): qty 1

## 2017-05-14 MED ORDER — POTASSIUM CHLORIDE CRYS ER 20 MEQ PO TBCR: 20.0000 meq | EXTENDED_RELEASE_TABLET | Freq: Every day | ORAL | Status: DC | PRN

## 2017-05-14 MED ORDER — SUGAMMADEX SODIUM 200 MG/2ML IV SOLN
INTRAVENOUS | Status: DC | PRN
  Administered 2017-05-14: 200 mg via INTRAVENOUS

## 2017-05-14 MED ORDER — SODIUM CHLORIDE 0.9 % IV SOLN
INTRAVENOUS | Status: DC
  Administered 2017-05-14 – 2017-05-17 (×3): via INTRAVENOUS

## 2017-05-14 MED ORDER — ACETAMINOPHEN 650 MG RE SUPP: 325.0000 mg | RECTAL | Status: DC | PRN

## 2017-05-14 MED ORDER — PANTOPRAZOLE SODIUM 40 MG PO TBEC
40.0000 mg | DELAYED_RELEASE_TABLET | Freq: Every day | ORAL | Status: DC
  Administered 2017-05-14 – 2017-05-26 (×12): 40 mg via ORAL
  Filled 2017-05-14 (×13): qty 1

## 2017-05-14 MED ORDER — HEPARIN SODIUM (PORCINE) 1000 UNIT/ML IJ SOLN
INTRAMUSCULAR | Status: AC
  Filled 2017-05-14: qty 1

## 2017-05-14 MED ORDER — ALUM & MAG HYDROXIDE-SIMETH 200-200-20 MG/5ML PO SUSP: 15.0000 mL | ORAL | Status: DC | PRN

## 2017-05-14 MED ORDER — ACETAMINOPHEN 325 MG PO TABS
325.0000 mg | ORAL_TABLET | ORAL | Status: DC | PRN
  Administered 2017-05-17 – 2017-05-18 (×2): 650 mg via ORAL
  Filled 2017-05-14 (×3): qty 2

## 2017-05-14 MED ORDER — PROTAMINE SULFATE 10 MG/ML IV SOLN
INTRAVENOUS | Status: AC
  Filled 2017-05-14: qty 5

## 2017-05-14 MED ORDER — NITROGLYCERIN 0.4 MG SL SUBL
SUBLINGUAL_TABLET | SUBLINGUAL | Status: AC
  Administered 2017-05-14: 0.4 mg
  Filled 2017-05-14: qty 1

## 2017-05-14 MED ORDER — METOPROLOL SUCCINATE ER 100 MG PO TB24
100.0000 mg | ORAL_TABLET | Freq: Every day | ORAL | Status: DC
  Administered 2017-05-14 – 2017-05-16 (×2): 100 mg via ORAL
  Filled 2017-05-14 (×3): qty 1

## 2017-05-14 MED ORDER — SUGAMMADEX SODIUM 200 MG/2ML IV SOLN
INTRAVENOUS | Status: AC
  Filled 2017-05-14: qty 2

## 2017-05-14 MED ORDER — ONDANSETRON HCL 4 MG/2ML IJ SOLN
INTRAMUSCULAR | Status: DC | PRN
  Administered 2017-05-14: 4 mg via INTRAVENOUS

## 2017-05-14 NOTE — Op Note (Signed)
Patient name: Kylie Sanchez MRN: [id number] DOB: 01-Aug-1925 Sex: female  05/13/2017 Pre-operative Diagnosis: Occluded right internal carotid artery, following carotid endarterectomy Post-operative diagnosis:  Same Surgeon:  [name] Assistants:  Gae Gallop Procedure:   #1: Redo right carotid endarterectomy with bovine pericardial patch angioplasty   #2: Resection with primary anastomosis of a 2 cm segment of the internal carotid artery Anesthesia:  General Blood Loss:  See anesthesia record Specimens:  None  Findings:  Thrombosis of the internal carotid artery.  This appeared to be secondary to a redundant posterior wall to the internal carotid artery.  I resected approximately 2 cm the internal carotid artery and performed a primary anastomosis and then redid the patch angioplasty with bovine pericardium.  Indications:  The patient underwent right carotid endarterectomy earlier today.  She awoke from anesthesia without incident was taken to recovery room.  While the recovery room it was felt that she had a left facial droop.  A code stroke was called.  The patient went downstairs for a CT scan of the head as well as CT antegrade of the neck.  The CT angiogram revealed an occluded internal carotid artery.  Dr. Scot Dock spoke with the family and we have decided to take the patient emergently back to the operating room for exploration.  Procedure:  The patient was identified in the holding area and taken to [place] 10  The patient was then placed supine on the table.   General endotrachial anesthesia was administered.  The patient was prepped and draped in the usual sterile fashion.  A time out was called and antibiotics were administered.  The patient's previous right carotid incision was opened with a #10 blade.  The sutures were removed and a cerebellar retractor was placed.  I encircled the common carotid, internal carotid, adnexal carotid artery with loops.  The patient was fully  heparinized.  I made a longitudinal arteriotomy along the patch.  There was thrombus within the internal carotid artery.  I passed a #2 [place] distally and evacuated the clot and what appeared to be a distal.  This was done 3 times until no further clot was evacuated.  There was excellent back bleeding.  It appeared that there was redundancy on the posterior wall of the internal carotid artery which created a shelf and this is what caused the carotid to occlude.  I felt the best way to repair this was to resect this area.  I removed the previous bovine pericardial patch.  I placed 4 7-0 marking sutures and resected approximately a 2 cm segment of the internal carotid artery.  I then used a 7-0 Prolene to reapproximate the 2 edges and re-create the posterior wall.  Dextran was used to irrigate the artery and remove any residual clot.  I then selected a bovine pericardial patch and performed patch angioplasty of the common and internal carotid artery.  Just prior to completion, the appropriate flushing maneuvers were performed.  I again irrigated copiously with dextran and then closed the arteriotomy.  Blood flow was reestablished first removing the clamp on the external carotid artery followed by the common carotid artery and ultimately on the internal carotid artery.  Hand-held Doppler was used to evaluate the common internal and external carotid artery, they all had appropriate signals.  50 mg of protamine was administered.  I placed a 15 Blake drain and brought this out through a separate incision which was secured in place with 2-0 nylon.  I reapproximated  the carotid sheath with 3-0 Vicryl.  The platysma muscles closed with 3-0 Vicryl.  The skin was closed with running 4-0 Vicryl.  Dermabond was applied.  The patient was successfully awakened from anesthesia and was moving all 4 extremities.  Disposition:  To PACU in stable condition.   Theotis Burrow, M.D. [facility]  [place] Office: [phone] Pager:  [phone]

## 2017-05-14 NOTE — Progress Notes (Addendum)
  1 Day Post-Op  Subjective:  Daughter states that prior to going back to the OR last night, she c/o difficulty swallowing.    afebrile HR 60's-80's  245'Y-099'I systolic 338% [id number]  Gtts:  None  Vitals:   05/14/17 0500 05/14/17 0749  BP: 115/84 137/72  Pulse: 93 90  Resp: (!) 22 15  Temp:  97.6 F (36.4 C)     Physical Exam: Neuro:  Grips equal bilaterally; bilateral extremities are equal bilaterally; mild facial droop; mild tongue deviation to the right. Lungs:  Non labored Incision:  Clean and dry; drain in place  CBC    Component Value Date/Time   WBC 9.2 05/14/2017 0346   RBC 2.86 (L) 05/14/2017 0346   HGB 8.5 (L) 05/14/2017 0346   HCT 26.2 (L) 05/14/2017 0346   PLT 221 05/14/2017 0346   MCV 91.6 05/14/2017 0346   MCH 29.7 05/14/2017 0346   MCHC 32.4 05/14/2017 0346   RDW 12.8 05/14/2017 0346   LYMPHSABS 1,943 04/22/2017 1536   MONOABS 469 04/22/2017 1536   EOSABS 402 04/22/2017 1536   BASOSABS 67 04/22/2017 1536    BMET    Component Value Date/Time   NA 137 05/14/2017 0346   K 5.1 05/14/2017 0346   CL 110 05/14/2017 0346   CO2 20 (L) 05/14/2017 0346   GLUCOSE 175 (H) 05/14/2017 0346   BUN 22 (H) 05/14/2017 0346   CREATININE 1.07 (H) 05/14/2017 0346   CREATININE 1.08 (H) 04/22/2017 1536   CALCIUM 8.3 (L) 05/14/2017 0346   GFRNONAA 45 (L) 05/14/2017 0346   GFRNONAA 46 (L) 04/22/2017 1536   GFRAA 52 (L) 05/14/2017 0346   GFRAA 53 (L) 04/22/2017 1536     Intake/Output Summary (Last 24 hours) at 05/14/17 0855 Last data filed at 05/14/17 0749  Gross per 24 hour  Intake             2726 ml  Output             1907 ml  Net              819 ml     Assessment/Plan:  This is a 83 y.o. female who is s/p right CEA 1 Day Post-Op  -pt is doing well this am, per RN report, pt c/o some difficulty swallowing last evening-will get a swallow eval today -pt neuro exam is in tact with exception of mild facial droop and mild right tongue deviation -JP drain  with minimal output-most likely remove today. -keep in stepdown today  Leontine Locket, PA-C [facility] [phone]  I have interviewed the patient and examined the patient. I agree with the findings by the PA.  Her left facial droop has improved significantly. She has some mild clumsiness of the left upper extremity but good grip strength. She will need physical therapy. Agree with plans for swallowing evaluation. JP with minimal drainage so this can be discontinued. Updated daughter.  Gae Gallop, MD [phone]

## 2017-05-14 NOTE — Progress Notes (Signed)
CRITICAL VALUE ALERT  Critical Value:  Troponin 0.05  Date & Time Notied:  05/14/2017 At 1715  Provider Notified: Scot Dock  Orders Received/Actions taken: will continue to monitor

## 2017-05-14 NOTE — Op Note (Signed)
Patient name: Kylie Sanchez MRN: [id number] DOB: 06-08-1925 Sex: female  05/13/2017 Pre-operative Diagnosis: Asymptomatic   right carotid stenosis Post-operative diagnosis:  Same Surgeon:  [name] Assistants:  Silva Bandy Procedure:    right carotid Endarterectomy with bovine pericardial  patch angioplasty Anesthesia:  General Blood Loss:  See anesthesia record Specimens:  none  Findings:  95 %stenosis; Thrombus:  none  Indications:  The patient had a neurologic event.  During her workup she was found to have an 80% right carotid stenosis.  This was by ultrasound.  It was felt that her event which was a near syncopal/syncopal episode was related to cardiac issues.  I had a long discussion with the patient and her daughter.  They wished to proceed with carotid endarterectomy for stroke prevention  Procedure:  The patient was identified in the holding area and taken to [place] 10  The patient was then placed supine on the table.   General endotrachial anesthesia was administered.  The patient was prepped and draped in the usual sterile fashion.  A time out was called and antibiotics were administered.  The incision was made along the anterior border of the right sternocleidomastoid muscle.  Cautery was used to dissect through the subcutaneous tissue.  The platysma muscle was divided with cautery.  The internal jugular vein was exposed along its anterior medial border.  The common facial vein was exposed and then divided between 2-0 silk ties and metal clips.  The common carotid artery was then circumferentially exposed and encircled with an umbilical tape.  The vagus nerve was identified and protected.  Next sharp dissection was used to expose the external carotid artery and the superior thyroid artery.  The were encircled with a blue vessel loop and a 2-0 silk tie respectively.  Finally, the internal carotid was carefully dissected free.  An umbilical tape was placed around the internal carotid  artery distal to the diseased segment.  The hypoglossal nerve was visualized throughout and protected.  The patient was given systemic heparinization.  A bovine carotid patch was selected and prepared on the back table.  A 10 french shunt was also prepared.  After blood pressure readings were appropriate and the heparin had been given time to circulate, the internal carotid artery was occluded with a baby Gregory clamp.  The external and common carotid arteries were then occluded with vascular clamps and the 2-0 tie tightened on the superior thyroid artery.  A #11 blade was used to make an arteriotomy in the common carotid artery.  This was extended with Potts scissors along the anterior and lateral border of the common and internal carotid artery.  Approximately 95% stenosis was identified.  There was no thrombus identified.  The 10 french shunt was not placed due to how distal the lesion was.  A kleiner kuntz elevator was used to perform endarterectomy.  An eversion endarterectomy was performed in the external carotid artery.  A good distal endpoint was obtained in the internal carotid artery.  The specimen was removed and sent to pathology.  Heparinized saline was used to irrigate the endarterectomized field.  All potential embolic debris was removed.  Bovine pericardial patch angioplasty was then performed using a running 6-0 Prolene. The common internal and external carotid arteries were all appropriately flushed. The artery was again irrigated with heparin saline.  The anastomosis was then secured. The clamp was first released on the external carotid artery followed by the common carotid artery approximately 30 seconds  later, bloodflow was reestablish through the internal carotid artery.  Next, a hand-held  Doppler was used to evaluate the signals in the common, external, and internal  carotid arteries, all of which had appropriate signals. I then administered  50 mg protamine. The wound was then irrigated.   After hemostasis was achieved, the carotid sheath was reapproximated with 3-0 Vicryl. The  platysma muscle was reapproximated with running 3-0 Vicryl. The skin  was closed with 4-0 Vicryl. Dermabond was placed on the skin. The  patient was then successfully extubated. Her neurologic exam was  similar to his preprocedural exam. The patient was then taken to recovery room  in stable condition. There were no complications.     Disposition:  To PACU in stable condition.  Relevant Operative Details:  Extremely high lesion which began in the internal carotid artery and extended approximately 3 cm distally.  This was well under the hypoglossal nerve which had to be mobilized.  I was unable to place a shunt secondary to the distal extent of the lesion, and poor visualization with a shunt.  There was approximately a 95% calcified plaque extending approximately 3 cm into the internal carotid artery.  This was removed in its entirety.  I spent a long time making sure that I removed all of the potential embolic debris.  I did place 2 7-0 tacking sutures at the distal plaque.  I irrigated the endarterectomized bed and there was no remaining potential embolic debris.  Patch angioplasty was performed.  The patient tolerated procedure and was successfully extubated about be moving all 4 extremities to command and appropriately answering questions.  Theotis Burrow, M.D. [facility] Office: [phone] Pager:  [phone]

## 2017-05-14 NOTE — Evaluation (Signed)
Clinical/Bedside Swallow Evaluation Patient Details  Name: Kylie Sanchez MRN: [id number] Date of Birth: January 21, 1925  Today's Date: 05/14/2017 Time: SLP Start Time (ACUTE ONLY): 1046 SLP Stop Time (ACUTE ONLY): 1104 SLP Time Calculation (min) (ACUTE ONLY): 18 min  Past Medical History:  Past Medical History:  Diagnosis Date  . Cancer ([place]) 2015   left eye  . Cataract   . CKD (chronic kidney disease) stage 3, GFR 30-59 ml/min    due to DM  . Dizziness   . Hyperlipidemia   . Hypertension   . Hypertensive cardiovascular disease   . Impaired vision   . Paroxysmal atrial fibrillation (HCC)   . Presence of permanent cardiac pacemaker   . Retinopathy   . Sick sinus syndrome ([place])   . Type II or unspecified type diabetes mellitus without mention of complication, not stated as uncontrolled    Past Surgical History:  Past Surgical History:  Procedure Laterality Date  . DENTAL SURGERY    . ENDARTERECTOMY Right 05/13/2017   Procedure: REDO ENDARTERECTOMY CAROTID Resection Redundant Internal Corotid Artery.;  Surgeon: Angelia Mould, MD;  Location: [facility];  Service: Vascular;  Laterality: Right;  . ENDARTERECTOMY Right 05/13/2017   Procedure: ENDARTERECTOMY CAROTID-RIGHT;  Surgeon: Serafina Mitchell, MD;  Location: [facility] OR;  Service: Vascular;  Laterality: Right;  . EYE SURGERY Left    tumor and eye removed  . PACEMAKER INSERTION  04/20/12   MDT Adapta L implanted by Dr Rayann Heman  . PATCH ANGIOPLASTY Right 05/13/2017   Procedure: PATCH ANGIOPLASTY USING Rueben Bash BIOLOGIC PATCH;  Surgeon: Serafina Mitchell, MD;  Location: [facility] OR;  Service: Vascular;  Laterality: Right;  . PERMANENT PACEMAKER INSERTION N/A 04/20/2012   Procedure: PERMANENT PACEMAKER INSERTION;  Surgeon: Thompson Grayer, MD;  Location: [facility] CATH LAB;  Service: Cardiovascular;  Laterality: N/A;   HPI:  83 y.o. female with hx of DM, SSS, afib admitted for scheduled right CEA 7/3.  Post-operatively, pt developed left facial asymmetry.  CT  showed abrupt occlusion ICA at distal end of patch; pt taken back to OR for resection with primary anastomosis.  Pt c/o difficulty swallowing.    Assessment / Plan / Recommendation Clinical Impression  Pt presents with a mild dysphagia based on limited assessment.  She was reluctant to eat/drink, requiring encouragement to participate then adamantly refusing further assessment after a few trials.  There are mild CN deficits on left related to V, VII.  Tongue is symmetric upon extension today. Pt describes mild soreness upon swallowing.  Pt with adequate mastication of purees, intermittent s/s of aspiration with thin liquids.  For today, initiate a dysphagia 1 diet with nectar-thick liquids; crush meds in puree. Anticipate rapid improvement with time s/p surgery.  SLP Visit Diagnosis: Dysphagia, unspecified (R13.10)    Aspiration Risk  Mild aspiration risk    Diet Recommendation   dysphagia 1 (pt declined regular solids due to discomfort), nectar-thick liquids  Medication Administration: Crushed with puree    Other  Recommendations Oral Care Recommendations: Oral care BID   Follow up Recommendations None      Frequency and Duration min 2x/week  1 week       Prognosis Prognosis for Safe Diet Advancement: Good      Swallow Study   General Date of Onset: 05/13/17 HPI: 83 y.o. female with hx of DM, SSS, afib admitted for scheduled right CEA 7/3.  Post-operatively, pt developed left facial asymmetry.  CT showed abrupt occlusion ICA at distal end of patch; pt taken  back to OR for resection with primary anastomosis.  Pt c/o difficulty swallowing.  Type of Study: Bedside Swallow Evaluation Previous Swallow Assessment: no Diet Prior to this Study: NPO Temperature Spikes Noted: No Respiratory Status: Nasal cannula History of Recent Intubation: Yes Length of Intubations (days):  (twice for CEAs) Behavior/Cognition: Alert Oral Cavity Assessment: Within Functional Limits Oral Care Completed  by SLP: No Oral Cavity - Dentition: Missing dentition (pt declined upper dentures) Vision: Functional for self-feeding Self-Feeding Abilities: Able to feed self Patient Positioning: Upright in bed Baseline Vocal Quality: Hoarse Volitional Cough: Strong Volitional Swallow: Able to elicit    Oral/Motor/Sensory Function Overall Oral Motor/Sensory Function: Mild impairment Facial ROM: Suspected CN VII (facial) dysfunction;Reduced left Facial Symmetry: Abnormal symmetry left;Suspected CN VII (facial) dysfunction Mandible: Suspected CN V (Trigeminal) dysfunction   Ice Chips Ice chips: Within functional limits Presentation: Spoon   Thin Liquid Thin Liquid: Impaired Presentation: Cup;Self Fed Pharyngeal  Phase Impairments: Cough - Immediate    Nectar Thick Nectar Thick Liquid: Within functional limits   Honey Thick Honey Thick Liquid: Not tested   Puree Puree: Within functional limits   Solid   GO   Solid: Not tested        Juan Quam Laurice 05/14/2017,11:08 AM Estill Bamberg L. Tivis Ringer, [place] CCC/SLP Pager [phone]

## 2017-05-14 NOTE — Progress Notes (Signed)
paged dr Scot Dock regarding  12 lead ekg results

## 2017-05-14 NOTE — Progress Notes (Signed)
   VASCULAR SURGERY :   Called to see patient because of chest pain earlier. She developed some chest pressure proximally 4 PM. She was given one SL nitroglycerin and her pain resolved. EKG showed sinus rhythm with some ST & T wave abnormality with possible anterior ischemia. Troponin was mildly elevated at 0.05 (normal < 0.03). Currently asymptomatic. Will follow will get f/u troponin level and follow closely. If any further symptoms, will need cardiac evaluation. (ECHO 04/17/17- EF 55-60%).   SUBJECTIVE:   No CP currently.   PHYSICAL EXAM:   Vitals:   05/14/17 1615 05/14/17 1630 05/14/17 1645 05/14/17 1700  BP: 116/60 107/86 (!) 104/51 (!) 107/51  Pulse: 86 92 82 83  Resp: 14 17 17 17   Temp:      TempSrc:      SpO2: 100% 100% 100% 100%  Weight:      Height:       Lungs clear NEURO: intact  Gae Gallop Beeper: [phone] Office: [phone] 05/14/2017

## 2017-05-14 NOTE — Progress Notes (Addendum)
Paged dr Scot Dock regarding pt having chest pain, new orders rec'd, pt neuro checks and grips remains same as prior when assessed

## 2017-05-14 NOTE — Progress Notes (Signed)
Patient c/o heavy arms that she is unable to lift.  She had no trouble with this earlier in the shift as she lifted them to put her gown on.  She also c/o "shaking inside".  Rapid Response RN called as she had seen patient earlier for other neuro symptoms.  Patient appears neuro intact, is alert and oriented, and able to follow commands.  Patient is able to hold arms up for Rapid Response RN.  Patient made comfortable with warm blankets to reduce "shaking".  Will continue to monitor.

## 2017-05-14 NOTE — Transfer of Care (Signed)
Immediate Anesthesia Transfer of Care Note  Patient: Kylie Sanchez  Procedure(s) Performed: Procedure(s): REDO ENDARTERECTOMY CAROTID Resection Redundant Internal Corotid Artery. (Right)  Patient Location: PACU  Anesthesia Type:General  Level of Consciousness: awake and alert   Airway & Oxygen Therapy: Patient connected to nasal cannula oxygen  Post-op Assessment: Report given to RN, Post -op Vital signs reviewed and stable and Patient moving all extremities X 4  Post vital signs: Reviewed and stable  Last Vitals:  Vitals:   05/13/17 2204 05/13/17 2218  BP: (!) 132/57 (!) 132/52  Pulse: 77 76  Resp: 12 (!) 8  Temp:      Last Pain:  Vitals:   05/13/17 2230  TempSrc:   PainSc: 0-No pain      Patients Stated Pain Goal: 2 (25/27/12 9290)  Complications: No apparent anesthesia complications

## 2017-05-14 NOTE — Significant Event (Signed)
Rapid Response Event Note  Overview:Called by bedside RN as a "second set of eyes."  Pt c/o weakness and "shaking." Time Called: 0430 Arrival Time: 8101 Event Type: Neurologic  Initial Focused Assessment: Pt laying in bed, eyes closed.  Pt will follow commands and answer questions appropriately.  Pt is irritable when stimulated.  Pt c/o being weak and shaky. VSS. NIH-3 for drowsiness, L facial droop, and slurred speech.  L facial droop and slurred speech appear better than previous RRT call.  Interventions: NIH done-better than previously.  Temperature in room increased, warm blankets applied.  Plan of Care (if not transferred): Monitor pt.  Call RRT if neuro assessment changes or further assistance needed.  Event Summary: Stayed in room and stabilized  Event end time: 0445  Dillard Essex

## 2017-05-15 LAB — GLUCOSE, CAPILLARY
GLUCOSE-CAPILLARY: 134 mg/dL — AB (ref 65–99)
GLUCOSE-CAPILLARY: 127 mg/dL — AB (ref 65–99)
Glucose-Capillary: 146 mg/dL — ABNORMAL HIGH (ref 65–99)
Glucose-Capillary: 145 mg/dL — ABNORMAL HIGH (ref 65–99)

## 2017-05-15 LAB — TROPONIN I: TROPONIN I: 0.04 ng/mL — AB (ref <0.03)

## 2017-05-15 MED ORDER — PHENOL 1.4 % MT LIQD
1.0000 | OROMUCOSAL | Status: DC | PRN
  Administered 2017-05-15: 1 via OROMUCOSAL

## 2017-05-15 MED ORDER — WARFARIN SODIUM 5 MG PO TABS
5.0000 mg | ORAL_TABLET | Freq: Once | ORAL | Status: AC
  Administered 2017-05-15: 5 mg via ORAL
  Filled 2017-05-15: qty 1

## 2017-05-15 MED ORDER — WARFARIN - PHARMACIST DOSING INPATIENT
Freq: Every day | Status: DC
  Administered 2017-05-15: 18:00:00

## 2017-05-15 NOTE — Progress Notes (Signed)
Pt c/o chest pain. Describes it as a pressure and rates it a 8/10. Pain does not radiate. VS is stable, and EKG is normal. 2 mg of Morphine IV given, and pt rated pain a 1/10, and now a 0/10. Troponin is been checked since pt's first chest pain episode this afternoon. Will continue to monitor.       05/15/17 0101  Vitals  BP (!) 125/59  MAP (mmHg) 75  Pulse Rate 87  ECG Heart Rate 92  Resp 17  Oxygen Therapy  SpO2 100 %  Pain Assessment  Pain Assessment 0-10  Pain Score 8  Pain Type Acute pain  Pain Location Chest  Pain Descriptors / Indicators Pressure  Pain Intervention(s) Medication (See eMAR);Emotional support  Glasgow Coma Scale  Eye Opening 4  Best Verbal Response (NON-intubated) 5  Best Motor Response 6  Glasgow Coma Scale Score 15

## 2017-05-15 NOTE — Progress Notes (Signed)
  Speech Language Pathology Treatment: Dysphagia  Patient Details Name: Kylie Sanchez MRN: [id number] DOB: Aug 04, 1925 Today's Date: 05/15/2017 Time: 8811-0315 SLP Time Calculation (min) (ACUTE ONLY): 23 min  Assessment / Plan / Recommendation Clinical Impression  Pt making some improvement with her appetite this am, taking sips of nectar thick liquids and thin water as well as bites of grits with am meal. Increased throat clearing still noted with thin liquids; puree and nectar are better tolerated. Regardless, pt appears to be protecting her airway when residue/penetrates are sensed and can have sips of thin water as desired. Continue current diet otherwise, given tolerance and signs of improvement. Dysphonia is concerning. Will need to monitor closely; if dysphonia and dysphagia persist past a few days pt would benefit from a FEES for direct visualization of the airway.   HPI HPI: 83 y.o. female with hx of DM, SSS, afib admitted for scheduled right CEA 7/3.  Post-operatively, pt developed left facial asymmetry.  CT showed abrupt occlusion ICA at distal end of patch; pt taken back to OR for resection with primary anastomosis.  Pt c/o difficulty swallowing.       SLP Plan  Continue with current plan of care       Recommendations  Diet recommendations: Nectar-thick liquid;Dysphagia 1 (puree) Liquids provided via: Cup Medication Administration: Crushed with puree Supervision: Patient able to self feed Compensations: Slow rate;Small sips/bites;Effortful swallow;Multiple dry swallows after each bite/sip;Follow solids with liquid Postural Changes and/or Swallow Maneuvers: Out of bed for meals;Seated upright 90 degrees                Oral Care Recommendations: Oral care BID Follow up Recommendations: None SLP Visit Diagnosis: Dysphagia, unspecified (R13.10) Plan: Continue with current plan of care       GO               [facility], [place] CCC-SLP [phone]  Lynann Beaver 05/15/2017, 9:14 AM

## 2017-05-15 NOTE — Evaluation (Signed)
Physical Therapy Evaluation Patient Details Name: Kylie Sanchez MRN: [id number] DOB: October 25, 1925 Today's Date: 05/15/2017   History of Present Illness  83 yo admitted 83 y.o. female with hx of admitted for scheduled right CEA 7/3.  Post-operatively, pt developed left facial asymmetry.  CT showed abrupt occlusion ICA at distal end of patch; pt taken back to OR for resection with primary anastomosis PMhx: Afib, HTN, HLD, pacemaker, CKd, Left-eye enucleation due to basal cell carcinoma DM, SSS  Clinical Impression  Patient is s/p above surgery resulting in functional limitations due to the deficits listed below (see PT Problem List). Pt is minA for transfers and ambulation of 18 feet with RW. Pt currently limited by fatigue and weakness. Patient will benefit from skilled PT to increase their independence and safety with mobility to allow discharge to the venue listed below.       Follow Up Recommendations SNF    Equipment Recommendations   (to be determined at next venue)    Recommendations for Other Services       Precautions / Restrictions Precautions Precautions: Fall Restrictions Weight Bearing Restrictions: No      Mobility  Bed Mobility               General bed mobility comments: in recliner at entry  Transfers Overall transfer level: Needs assistance Equipment used: Rolling walker (2 wheeled) Transfers: Sit to/from Stand Sit to Stand: Min assist         General transfer comment: minA for power up, vc for hand placement  Ambulation/Gait Ambulation/Gait assistance: Min assist Ambulation Distance (Feet): 18 Feet Assistive device: Rolling walker (2 wheeled) Gait Pattern/deviations: Step-through pattern;Shuffle;Trunk flexed;Decreased stride length Gait velocity: slowed Gait velocity interpretation: Below normal speed for age/gender General Gait Details: minA for steadying, vc for staying inside walker and for walker management, minor difficulty for navigating  obstacles in L eye visual field          Balance Overall balance assessment: Needs assistance Sitting-balance support: Feet supported;No upper extremity supported Sitting balance-Leahy Scale: Fair     Standing balance support: Bilateral upper extremity supported Standing balance-Leahy Scale: Fair Standing balance comment: requires RW for steadying                             Pertinent Vitals/Pain Pain Assessment: No/denies pain    Home Living Family/patient expects to be discharged to:: Private residence Living Arrangements: Alone Available Help at Discharge: Family;Available PRN/intermittently;Other (Comment) (nursing 9-2 daily,possibility for overnight care) Type of Home: Apartment Home Access: Level entry;Elevator     Home Layout: One level Home Equipment: Walker - 2 wheels;Shower seat      Prior Function Level of Independence: Needs assistance   Gait / Transfers Assistance Needed: uses RW limited household ambulation  ADL's / Homemaking Assistance Needed: independent with ADLS, assist with iADLs           Extremity/Trunk Assessment   Upper Extremity Assessment Upper Extremity Assessment: Overall WFL for tasks assessed;Generalized weakness    Lower Extremity Assessment Lower Extremity Assessment: Overall WFL for tasks assessed;Generalized weakness    Cervical / Trunk Assessment Cervical / Trunk Assessment: Kyphotic  Communication   Communication: HOH;Other (comment) (hoarse voice)  Cognition Arousal/Alertness: Awake/alert Behavior During Therapy: Flat affect;WFL for tasks assessed/performed Overall Cognitive Status: Within Functional Limits for tasks assessed  General Comments General comments (skin integrity, edema, etc.): BP 123/60, HR 89 bpm, SaO2 100% O2 on 2L via nasal cannula, with activity on RA SaO2 94%O2        Assessment/Plan    PT Assessment Patient needs continued PT  services  PT Problem List Decreased strength;Decreased activity tolerance;Decreased balance;Decreased mobility;Cardiopulmonary status limiting activity       PT Treatment Interventions DME instruction;Gait training;Functional mobility training;Therapeutic activities;Therapeutic exercise;Balance training;Patient/family education    PT Goals (Current goals can be found in the Care Plan section)  Acute Rehab PT Goals Patient Stated Goal: go home PT Goal Formulation: With patient Time For Goal Achievement: 05/22/17 Potential to Achieve Goals: Fair    Frequency Min 2X/week    AM-PAC PT "6 Clicks" Daily Activity  Outcome Measure Difficulty turning over in bed (including adjusting bedclothes, sheets and blankets)?: Total Difficulty moving from lying on back to sitting on the side of the bed? : Total Difficulty sitting down on and standing up from a chair with arms (e.g., wheelchair, bedside commode, etc,.)?: A Lot Help needed moving to and from a bed to chair (including a wheelchair)?: A Little Help needed walking in hospital room?: A Little Help needed climbing 3-5 steps with a railing? : Total 6 Click Score: 11    End of Session Equipment Utilized During Treatment: Gait belt Activity Tolerance: Patient tolerated treatment well Patient left: in chair;with call bell/phone within reach;with nursing/sitter in room;with family/visitor present;with chair alarm set Nurse Communication: Mobility status PT Visit Diagnosis: Unsteadiness on feet (R26.81);Other abnormalities of gait and mobility (R26.89);Difficulty in walking, not elsewhere classified (R26.2);Muscle weakness (generalized) (M62.81)    Time: 1655-3748 PT Time Calculation (min) (ACUTE ONLY): 60 min   Charges:     PT Treatments $Gait Training: 8-22 mins $Therapeutic Activity: 23-37 mins   PT G Codes:        Sharetha Newson B. Migdalia Dk PT, DPT Acute Rehabilitation  [phone] Pager [phone]    [place] 05/15/2017, 1:17 PM

## 2017-05-15 NOTE — Progress Notes (Signed)
ANTICOAGULATION CONSULT NOTE - Initial Consult  Pharmacy Consult for warfarin Indication: atrial fibrillation  Allergies  Allergen Reactions  . No Known Allergies     Patient Measurements: Height: 5\' 2"  (157.5 cm) Weight: 150 lb 5.7 oz (68.2 kg) IBW/kg (Calculated) : 50.1  Vital Signs: Temp: 97.4 F (36.3 C) (07/05 0719) Temp Source: Oral (07/05 0719) BP: 117/51 (07/05 0719) Pulse Rate: 90 (07/05 0719)  Labs:  Recent Labs  05/13/17 1121 05/13/17 1136 05/13/17 2104 05/14/17 0346 05/14/17 1604 05/14/17 2208 05/15/17 0531  HGB  --  10.5*  --  8.5*  --   --   --   HCT  --  31.0*  --  26.2*  --   --   --   PLT  --   --   --  221  --   --   --   LABPROT 13.5  --   --   --   --   --   --   INR 1.03  --   --   --   --   --   --   CREATININE  --   --   --  1.07*  --   --   --   CKTOTAL  --   --  19*  --  47  --   --   CKMB  --   --   --   --  2.7  --   --   TROPONINI  --   --   --   --  0.05* 0.05* 0.04*    Estimated Creatinine Clearance: 32.2 mL/min (A) (by C-G formula based on SCr of 1.07 mg/dL (H)).  Assessment: CC/HPI: 83 yo f s/p right carotid endarterectomy   PMH: CKD3, afib on warfarin, DM2  Anticoag: warfarin for afib. INR 1.03 on admit (no bridge necessary per VVS)  Pta warfarin 5 mg MWF 2.5 mg AOD  Renal: SCr 1.07  Heme/Onc: H&H 8.5/26.2, Plt 221  Goal of Therapy:  INR 2-3 Monitor platelets by anticoagulation protocol: Yes   Plan:  Warfarin 5 mg x 1  Daily INR, CBC q72h  Levester Fresh, PharmD, BCPS, BCCCP Clinical Pharmacist Clinical phone for 05/15/2017 from 7a-3:30p: [phone] If after 3:30p, please call main pharmacy at: [phone] 05/15/2017 10:43 AM

## 2017-05-15 NOTE — Progress Notes (Addendum)
Progress Note  SUBJECTIVE:    POD #2  Denies chest pain this am. Had episode of localized chest pain around 0100 this am per pm RN. No radiation, relieved with morphine. EKG with PVCs, otherwise normal.   Throat also hurts. Has not had anything to drink yet.   OBJECTIVE:   Vitals:   05/15/17 0318 05/15/17 0719  BP: (!) 123/53 (!) 117/51  Pulse: 87 90  Resp: 17 (!) 23  Temp: 97.6 F (36.4 C) (!) 97.4 F (36.3 C)    Intake/Output Summary (Last 24 hours) at 05/15/17 0734 Last data filed at 05/15/17 2633  Gross per 24 hour  Intake              910 ml  Output              252 ml  Net              658 ml   Right neck incision with ecchymosis. No hematoma. Mild left facial droop. Tongue midline. Grip strength 5/5 bilaterally. Lower extremities 5/5.   ASSESSMENT/PLAN:   83 y.o. 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 No hematoma.   No chest pain this morning. Had episode around 0100 that quickly resolved with morphine. EKG with PVCs this am. Troponins have been stable. Will continue to follow.   Didn't have any liquids last night. Dysphagia diet for now. Will advance as tolerated. Appreciate SLP evaluation.   OK to restart coumadin  WElls Jaquarious Grey  Will get PT eval.   Janalyn Harder Trinh 05/15/2017 7:34 AM -- LABS:   CBC    Component Value Date/Time   WBC 9.2 05/14/2017 0346   HGB 8.5 (L) 05/14/2017 0346   HCT 26.2 (L) 05/14/2017 0346   PLT 221 05/14/2017 0346    BMET    Component Value Date/Time   NA 137 05/14/2017 0346   K 5.1 05/14/2017 0346   CL 110 05/14/2017 0346   CO2 20 (L) 05/14/2017 0346   GLUCOSE 175 (H) 05/14/2017 0346   BUN 22 (H) 05/14/2017 0346   CREATININE 1.07 (H) 05/14/2017 0346   CREATININE 1.08 (H) 04/22/2017 1536   CALCIUM 8.3 (L)  05/14/2017 0346   GFRNONAA 45 (L) 05/14/2017 0346   GFRNONAA 46 (L) 04/22/2017 1536   GFRAA 52 (L) 05/14/2017 0346   GFRAA 53 (L) 04/22/2017 1536    COAG Lab Results  Component Value Date   INR 1.03 05/13/2017   INR 0.98 05/07/2017   INR 1.8 (H) 04/22/2017   No results found for: PTT  ANTIBIOTICS:   Anti-infectives    Start     Dose/Rate Route Frequency Ordered Stop   05/14/17 0400  cefUROXime (ZINACEF) 1.5 g in dextrose 5 % 50 mL IVPB     1.5 g 100 mL/hr over 30 Minutes Intravenous Every 12 hours 05/14/17 0235 05/14/17 1701   05/13/17 1107  dextrose 5 % with cefUROXime (ZINACEF) ADS Med    Comments:  Forte, Lindsi   : cabinet override      05/13/17 1107 05/13/17 2314   05/13/17 1104  cefUROXime (ZINACEF) 1.5 g in dextrose 5 % 50 mL IVPB     1.5 g 100 mL/hr over 30 Minutes Intravenous 30 min pre-op 05/13/17 1104 05/13/17 1536       Virgina Jock, PA-C  [facility] Office: [phone] Pager: [phone] 05/15/2017 7:34 AM   I agree with the above. I have seen and examined the patient.  She states she feels her neuro exam is at baseline.  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  She feels her left hand has normal function.    Her incision is healing nicely.  No hematoma.  Drain is out Will add chloraseptic for her throat pain SLP cleared her for a diet PT to eval today

## 2017-05-16 ENCOUNTER — Inpatient Hospital Stay (HOSPITAL_COMMUNITY): Payer: Medicare Other

## 2017-05-16 ENCOUNTER — Encounter (HOSPITAL_COMMUNITY): Payer: Self-pay | Admitting: Surgery

## 2017-05-16 DIAGNOSIS — R0789 Other chest pain: Secondary | ICD-10-CM

## 2017-05-16 DIAGNOSIS — R748 Abnormal levels of other serum enzymes: Secondary | ICD-10-CM

## 2017-05-16 DIAGNOSIS — I480 Paroxysmal atrial fibrillation: Secondary | ICD-10-CM

## 2017-05-16 LAB — GLUCOSE, CAPILLARY
GLUCOSE-CAPILLARY: 116 mg/dL — AB (ref 65–99)
Glucose-Capillary: 110 mg/dL — ABNORMAL HIGH (ref 65–99)
Glucose-Capillary: 137 mg/dL — ABNORMAL HIGH (ref 65–99)
Glucose-Capillary: 111 mg/dL — ABNORMAL HIGH (ref 65–99)

## 2017-05-16 LAB — PROTIME-INR
INR: 1.04
PROTHROMBIN TIME: 13.7 s (ref 11.4–15.2)

## 2017-05-16 MED ORDER — WARFARIN SODIUM 5 MG PO TABS
5.0000 mg | ORAL_TABLET | Freq: Once | ORAL | Status: AC
  Administered 2017-05-16: 5 mg via ORAL
  Filled 2017-05-16: qty 1

## 2017-05-16 MED ORDER — WHITE PETROLATUM GEL
Status: AC
  Administered 2017-05-16: 21:00:00
  Filled 2017-05-16: qty 1

## 2017-05-16 MED ORDER — LISINOPRIL 20 MG PO TABS
20.0000 mg | ORAL_TABLET | Freq: Every day | ORAL | Status: DC
  Filled 2017-05-16: qty 1

## 2017-05-16 MED ORDER — METOPROLOL TARTRATE 50 MG PO TABS
50.0000 mg | ORAL_TABLET | Freq: Four times a day (QID) | ORAL | Status: DC
  Filled 2017-05-16: qty 1

## 2017-05-16 NOTE — Progress Notes (Signed)
  Speech Language Pathology Treatment: Dysphagia  Patient Details Name: Kylie Sanchez MRN: [id number] DOB: 1925/07/24 Today's Date: 05/16/2017 Time: 0900-0920 SLP Time Calculation (min) (ACUTE ONLY): 20 min  Assessment / Plan / Recommendation Clinical Impression  Pt seen up in chair with thin liquids. Definite improvement noted in vocal quality, but persistent throat clearing and even delayed coughing noted at end of session after thin liquids. Objective study prior to d/c warranted to direct plan of care. Will proceed with MBS later this am.   HPI HPI: 83 y.o. female with hx of DM, SSS, afib admitted for scheduled right CEA 7/3.  Post-operatively, pt developed left facial asymmetry.  CT showed abrupt occlusion ICA at distal end of patch; pt taken back to OR for resection with primary anastomosis.  Pt c/o difficulty swallowing.       SLP Plan  [facility]BS       Recommendations  Diet recommendations: Dysphagia 3 (mechanical soft);Thin liquid Liquids provided via: Cup;Straw Medication Administration: Whole meds with liquid Supervision: Patient able to self feed                Oral Care Recommendations: Oral care BID Follow up Recommendations: Skilled Nursing facility Plan: MBS       GO                Branston Halsted, Katherene Ponto 05/16/2017, 9:29 AM

## 2017-05-16 NOTE — Consult Note (Signed)
Cardiology Consultation:   Patient ID: Kylie Sanchez; [id number]; 06-08-1925   Admit date: 05/13/2017 Date of Consult: 05/16/2017  Primary Care Provider: Unk Pinto, MD Primary Cardiologist: Dr Rayann Heman Primary Electrophysiologist:  Dr Rayann Heman   Patient Profile:   Kylie Sanchez is a 83 y.o. female with a hx of PAF on warfarin, symptomatic bradycardia with PPM ([employer]), diabetes type 2, hypertension, hyperlipidemia who is being seen today for the evaluation of chest pain and elevated troponins at the request of Dr. Trula Slade. Pt is S/P right carotid endarterectomy on 05/13/17 and redo on 05/14/17.   History of Present Illness:   Kylie Sanchez had transient syncope in 04/2017. Workup revealed advanced right carotid stenosis. She underwent right carotid endarterectomy on 05/13/17. On early 7/4 the patient developed feelings of weakness and shakiness and had a left facial droop and slurred speech. She was taken back to the OR on 7/4 for a redo right carotid endarterectomy. She developed chest pain that evening and EKG showed some ST/T wave abnormalities with possible anterior ischemia. The troponin was mildly elevated at 0.05.  The patient has been having an intermittent soreness across her upper chest that she feels most often when she is being assisted out of bed or to position in bed, when her arms are pulled on. She denies shortness of breath.  05/14/17: Redo right carotid endarterectomy with bovine pericardial patch angioplasty and resection with primary anastomosis of a 2 cm segment of the ICA.  05/13/17: Right carotid endarterectomy with bovine pericardial patch angioplasty  Last pacemaker interrogation on 04/24/17 showed normal device function, longest A. fib 2 hours, max atrial rates greater than 400 bpm, max ventricular rate 1 30 bpm, estimated longevity 10.5 years  Past Medical History:  Diagnosis Date  . Cancer ([place]) 2015   left eye  . Cataract   . CKD (chronic kidney disease) stage 3, GFR 30-59  ml/min    due to DM  . Dizziness   . Hyperlipidemia   . Hypertension   . Hypertensive cardiovascular disease   . Impaired vision   . Paroxysmal atrial fibrillation (HCC)   . Presence of permanent cardiac pacemaker   . Retinopathy   . Sick sinus syndrome ([place])   . Type II or unspecified type diabetes mellitus without mention of complication, not stated as uncontrolled     Past Surgical History:  Procedure Laterality Date  . DENTAL SURGERY    . ENDARTERECTOMY Right 05/13/2017   Procedure: REDO ENDARTERECTOMY CAROTID Resection Redundant Internal Corotid Artery.;  Surgeon: Angelia Mould, MD;  Location: [place];  Service: Vascular;  Laterality: Right;  . ENDARTERECTOMY Right 05/13/2017   Procedure: ENDARTERECTOMY CAROTID-RIGHT;  Surgeon: Serafina Mitchell, MD;  Location: [facility] OR;  Service: Vascular;  Laterality: Right;  . EYE SURGERY Left    tumor and eye removed  . PACEMAKER INSERTION  04/20/12   MDT Adapta L implanted by Dr Rayann Heman  . PATCH ANGIOPLASTY Right 05/13/2017   Procedure: PATCH ANGIOPLASTY USING Rueben Bash BIOLOGIC PATCH;  Surgeon: Serafina Mitchell, MD;  [facility] OR;  Service: Vascular;  Laterality: Right;  . PERMANENT PACEMAKER INSERTION N/A 04/20/2012   Procedure: PERMANENT PACEMAKER INSERTION;  Surgeon: Thompson Grayer, MD;  Location: [facility] CATH LAB;  Service: Cardiovascular;  Laterality: N/A;     Inpatient Medications: Scheduled Meds: . amLODipine  10 mg Oral Daily  . aspirin EC  325 mg Oral Daily  . docusate sodium  100 mg Oral Daily  . glimepiride  1 mg Oral Q  breakfast  . insulin aspart  0-9 Units Subcutaneous TID WC  . lisinopril  40 mg Oral Daily  . mouth rinse  15 mL Mouth Rinse BID  . metFORMIN  500 mg Oral Q breakfast  . metoprolol  100 mg Oral Daily  . pantoprazole  40 mg Oral Daily  . [place]   Oral Once  . warfarin  5 mg Oral ONCE-1800  . Warfarin - Pharmacist Dosing Inpatient   Does not apply q1800   Continuous Infusions: . sodium  chloride    . sodium chloride 50 mL/hr at 05/15/17 1900  . magnesium sulfate 1 - 4 g bolus IVPB     PRN Meds: sodium chloride, acetaminophen **OR** acetaminophen, alum & mag hydroxide-simeth, guaiFENesin-dextromethorphan, hydrALAZINE, labetalol, magnesium sulfate 1 - 4 g bolus IVPB, metoprolol tartrate, morphine injection, ondansetron, oxyCODONE-acetaminophen, phenol, phenol, potassium chloride  Allergies:    Allergies  Allergen Reactions  . No Known Allergies     Social History:   Social History   Social History  . Marital status: Single    Spouse name: N/A  . Number of children: N/A  . Years of education: N/A   Occupational History  . Not on file.   Social History Main Topics  . Smoking status: Never Smoker  . Smokeless tobacco: Never Used  . Alcohol use No  . Drug use: No  . Sexual activity: Not on file   Other Topics Concern  . Not on file   Social History Narrative  . No narrative on file    Family History:   The patient's family history includes Cancer in her other; Heart attack in her father; Heart disease in her father and mother; Hypertension in her mother.  ROS:  Please see the history of present illness.  ROS  All other ROS reviewed and negative.     Physical Exam/Data:   Vitals:   05/16/17 0600 05/16/17 0700 05/16/17 0850 05/16/17 1218  BP: 92/60 98/60  (!) 96/50  Pulse: 96 91  98  Resp: (!) 21 18  18   Temp:  98 F (36.7 C)  97.7 F (36.5 C)  TempSrc:  Oral  Oral  SpO2: 100% 100% 100% 98%  Weight:      Height:        Intake/Output Summary (Last 24 hours) at 05/16/17 1231 Last data filed at 05/16/17 0500  Gross per 24 hour  Intake             1650 ml  Output              100 ml  Net             1550 ml   Filed Weights   05/13/17 1115 05/14/17 0232  Weight: 140 lb (63.5 kg) 150 lb 5.7 oz (68.2 kg)   Body mass index is 27.5 kg/m.  General:  Well nourished, well developed, Pale female in no acute distress HEENT: Left eye is  absent Neck: no JVD Vascular: Surgical incision on the right neck. Pedal pulses present Cardiac:  normal S1, S2; irregularly irregular rhythm; no murmur  Lungs:  clear to auscultation bilaterally, no wheezing, rhonchi or rales  Abd: soft, nontender, no hepatomegaly  Ext: no edema Musculoskeletal:  No deformities, BUE and BLE strength normal and equal Skin: warm and dry  Neuro:  CNs 2-12 intact, no focal abnormalities noted. Psych:  Patient is irritable  EKG:  The EKG was personally reviewed and demonstrates:   05/14/17 1551  sinus rhythm with first-degree AV block, nonspecific ST/T changes in V3 through V5, similar to EKG on 6/6 except slower rate 05/15/17 at 0113 no changes 05/15/17 at 1516 showed slightly more pronounced ST/T changes and similar pattern  Telemetry:  Telemetry was personally reviewed and demonstrates:    Relevant CV Studies:  Echocardiogram 04/27/17 Study Conclusions  - Left ventricle: The cavity size was normal. Wall thickness was   increased in a pattern of severe LVH. Systolic function was   normal. The estimated ejection fraction was in the range of 55%   to 60%. Wall motion was normal; there were no regional wall   motion abnormalities. Doppler parameters are consistent with   abnormal left ventricular relaxation (grade 1 diastolic   dysfunction). - Aortic valve: Small mobile calcium deposit seen on PSL below   aortic valve near base of anterior leaflet. - Mitral valve: Calcified annulus. Mildly thickened leaflets . - Atrial septum: No defect or patent foramen ovale was identified. - Pulmonary arteries: PA peak pressure: 32 mm Hg (S).   Laboratory Data:  Chemistry Recent Labs Lab 05/13/17 1136 05/14/17 0346  NA 139 137  K 5.1 5.1  CL  --  110  CO2  --  20*  GLUCOSE 119* 175*  BUN  --  22*  CREATININE  --  1.07*  CALCIUM  --  8.3*  GFRNONAA  --  45*  GFRAA  --  52*  ANIONGAP  --  7    No results for input(s): PROT, ALBUMIN, AST, ALT, ALKPHOS,  BILITOT in the last 168 hours. Hematology Recent Labs Lab 05/13/17 1136 05/14/17 0346  WBC  --  9.2  RBC  --  2.86*  HGB 10.5* 8.5*  HCT 31.0* 26.2*  MCV  --  91.6  MCH  --  29.7  MCHC  --  32.4  RDW  --  12.8  PLT  --  221   Cardiac Enzymes Recent Labs Lab 05/14/17 1604 05/14/17 2208 05/15/17 0531  TROPONINI 0.05* 0.05* 0.04*   No results for input(s): TROPIPOC in the last 168 hours.  BNPNo results for input(s): BNP, PROBNP in the last 168 hours.  DDimer No results for input(s): DDIMER in the last 168 hours.  Radiology/Studies:  Ct Head Wo Contrast  Result Date: 05/13/2017 CLINICAL DATA:  LEFT facial droop and slurred speech. Status post RIGHT carotid endarterectomy. EXAM: CT HEAD WITHOUT CONTRAST CT ANGIOGRAPHY NECK TECHNIQUE: Multidetector CT imaging of the head and neck was performed without intravenous contrast. Multidetector CT imaging of the head and neck was performed using the standard protocol during bolus administration of intravenous contrast. Multiplanar CT image reconstructions and MIPs were obtained to evaluate the vascular anatomy. Carotid stenosis measurements (when applicable) are obtained utilizing NASCET criteria, using the distal internal carotid diameter as the denominator. CONTRAST:  50 cc Isovue 370 COMPARISON:  CT HEAD April 17, 2017 FINDINGS: CT HEAD FINDINGS BRAIN: No intraparenchymal hemorrhage, mass effect nor midline shift. The ventricles and sulci are normal for age. Patchy supratentorial white matter hypodensities within normal range for patient's age, though non-specific are most compatible with chronic small vessel ischemic disease. No acute large vascular territory infarcts. No abnormal extra-axial fluid collections. Basal cisterns are patent. VASCULAR: Moderate calcific atherosclerosis of the carotid siphons. SKULL: No skull fracture. No significant scalp soft tissue swelling. SINUSES/ORBITS: Mild sphenoid sinus mucosal thickening, status post LEFT  sinonasal surgery. Mastoid air cells are well aerated. Status post LEFT enucleation. RIGHT ocular lens implant. OTHER: None. CTA  NECK FINDINGS AORTIC ARCH: Normal appearance of the thoracic arch, 2 vessel arch is a normal variant. Mild calcific atherosclerosis of the aortic arch. The origins of the innominate, left Common carotid artery and subclavian artery are widely patent. RIGHT CAROTID SYSTEM: Common carotid artery is widely patent, coursing in a straight line fashion. Status post RIGHT carotid endarterectomy with patulous carotid bulb. Tapered proximal internal carotid artery, occluded 13 mm from the origin. No reconstitution in the neck. Faint reconstitution petrous segment, with loss of contrast opacification at anterior genu. Patent posterior communicating artery's and, normal contrast opacification of included of RIGHT middle cerebral artery. LEFT CAROTID SYSTEM: Common carotid artery is widely patent, coursing in a straight line fashion. Normal appearance of the carotid bifurcation without hemodynamically significant stenosis by NASCET criteria. Normal appearance of the included internal carotid artery, mild calcific atherosclerosis. VERTEBRAL ARTERIES:RIGHT vertebral artery is dominant. Vertebral artery's are widely patent. SKELETON: No acute osseous process though bone windows have not been submitted. OTHER NECK: Subcutaneous gas RIGHT anterior neck consistent with recent carotid artery, surgical drain in place. No focal fluid collection. Surgical clips in RIGHT carotid space. Enlarged heterogeneous thyroid without dominant nodule. Pacemaker noted on scout view. IMPRESSION: CT HEAD: 1. No acute intracranial process. 2. Stable involutional changes and moderate chronic small vessel ischemic disease. CTA NECK: 1. Status post recent RIGHT carotid endarterectomy. RIGHT internal carotid artery occlusion within 13 mm of the bifurcation. Faint reconstitution at petrous segment. Intracranial vessels not  evaluated. Critical Value/emergent results were called by telephone at the time of interpretation on 05/13/2017 at 10:37 pm to Dr. Deitra Mayo , who verbally acknowledged these results. Electronically Signed   By: Elon Alas M.D.   On: 05/13/2017 22:38   Ct Angio Neck W Or Wo Contrast  Result Date: 05/13/2017 CLINICAL DATA:  LEFT facial droop and slurred speech. Status post RIGHT carotid endarterectomy. EXAM: CT HEAD WITHOUT CONTRAST CT ANGIOGRAPHY NECK TECHNIQUE: Multidetector CT imaging of the head and neck was performed without intravenous contrast. Multidetector CT imaging of the head and neck was performed using the standard protocol during bolus administration of intravenous contrast. Multiplanar CT image reconstructions and MIPs were obtained to evaluate the vascular anatomy. Carotid stenosis measurements (when applicable) are obtained utilizing NASCET criteria, using the distal internal carotid diameter as the denominator. CONTRAST:  50 cc Isovue 370 COMPARISON:  CT HEAD April 17, 2017 FINDINGS: CT HEAD FINDINGS BRAIN: No intraparenchymal hemorrhage, mass effect nor midline shift. The ventricles and sulci are normal for age. Patchy supratentorial white matter hypodensities within normal range for patient's age, though non-specific are most compatible with chronic small vessel ischemic disease. No acute large vascular territory infarcts. No abnormal extra-axial fluid collections. Basal cisterns are patent. VASCULAR: Moderate calcific atherosclerosis of the carotid siphons. SKULL: No skull fracture. No significant scalp soft tissue swelling. SINUSES/ORBITS: Mild sphenoid sinus mucosal thickening, status post LEFT sinonasal surgery. Mastoid air cells are well aerated. Status post LEFT enucleation. RIGHT ocular lens implant. OTHER: None. CTA NECK FINDINGS AORTIC ARCH: Normal appearance of the thoracic arch, 2 vessel arch is a normal variant. Mild calcific atherosclerosis of the aortic arch. The  origins of the innominate, left Common carotid artery and subclavian artery are widely patent. RIGHT CAROTID SYSTEM: Common carotid artery is widely patent, coursing in a straight line fashion. Status post RIGHT carotid endarterectomy with patulous carotid bulb. Tapered proximal internal carotid artery, occluded 13 mm from the origin. No reconstitution in the neck. Faint reconstitution petrous segment, with loss of  contrast opacification at anterior genu. Patent posterior communicating artery's and, normal contrast opacification of included of RIGHT middle cerebral artery. LEFT CAROTID SYSTEM: Common carotid artery is widely patent, coursing in a straight line fashion. Normal appearance of the carotid bifurcation without hemodynamically significant stenosis by NASCET criteria. Normal appearance of the included internal carotid artery, mild calcific atherosclerosis. VERTEBRAL ARTERIES:RIGHT vertebral artery is dominant. Vertebral artery's are widely patent. SKELETON: No acute osseous process though bone windows have not been submitted. OTHER NECK: Subcutaneous gas RIGHT anterior neck consistent with recent carotid artery, surgical drain in place. No focal fluid collection. Surgical clips in RIGHT carotid space. Enlarged heterogeneous thyroid without dominant nodule. Pacemaker noted on scout view. IMPRESSION: CT HEAD: 1. No acute intracranial process. 2. Stable involutional changes and moderate chronic small vessel ischemic disease. CTA NECK: 1. Status post recent RIGHT carotid endarterectomy. RIGHT internal carotid artery occlusion within 13 mm of the bifurcation. Faint reconstitution at petrous segment. Intracranial vessels not evaluated. Critical Value/emergent results were called by telephone at the time of interpretation on 05/13/2017 at 10:37 pm to Dr. Deitra Mayo , who verbally acknowledged these results. Electronically Signed   By: Elon Alas M.D.   On: 05/13/2017 22:38   Dg Swallowing  Func-speech Pathology  Result Date: 05/16/2017 Objective Swallowing Evaluation: Type of Study: MBS-Modified Barium Swallow Study Patient Details Name: Kylie Sanchez MRN: [id number] Date of Birth: 1925/07/29 Today's Date: 05/16/2017 Time: SLP Start Time (ACUTE ONLY): 1057-SLP Stop Time (ACUTE ONLY): 1117 SLP Time Calculation (min) (ACUTE ONLY): 20 min Past Medical History: Past Medical History: Diagnosis Date . Cancer ([place]) 2015  left eye . Cataract  . CKD (chronic kidney disease) stage 3, GFR 30-59 ml/min   due to DM . Dizziness  . Hyperlipidemia  . Hypertension  . Hypertensive cardiovascular disease  . Impaired vision  . Paroxysmal atrial fibrillation (HCC)  . Presence of permanent cardiac pacemaker  . Retinopathy  . Sick sinus syndrome ([place])  . Type II or unspecified type diabetes mellitus without mention of complication, not stated as uncontrolled  Past Surgical History: Past Surgical History: Procedure Laterality Date . DENTAL SURGERY   . ENDARTERECTOMY Right 05/13/2017  Procedure: REDO ENDARTERECTOMY CAROTID Resection Redundant Internal Corotid Artery.;  Surgeon: Angelia Mould, MD;  Location: [place];  Service: Vascular;  Laterality: Right; . ENDARTERECTOMY Right 05/13/2017  Procedure: ENDARTERECTOMY CAROTID-RIGHT;  Surgeon: Serafina Mitchell, MD;  Location: [facility] OR;  Service: Vascular;  Laterality: Right; . EYE SURGERY Left   tumor and eye removed . PACEMAKER INSERTION  04/20/12  MDT Adapta L implanted by Dr Rayann Heman . PATCH ANGIOPLASTY Right 05/13/2017  Procedure: PATCH ANGIOPLASTY USING Rueben Bash BIOLOGIC PATCH;  Surgeon: Serafina Mitchell, MD;  Location: [facility] OR;  Service: Vascular;  Laterality: Right; . PERMANENT PACEMAKER INSERTION N/A 04/20/2012  Procedure: PERMANENT PACEMAKER INSERTION;  Surgeon: Thompson Grayer, MD;  Location: [facility] CATH LAB;  Service: Cardiovascular;  Laterality: N/A; HPI: 83 y.o. female with hx of DM, SSS, afib admitted for scheduled right CEA 7/3.  Post-operatively, pt developed left facial asymmetry.   CT showed abrupt occlusion ICA at distal end of patch; pt taken back to OR for resection with primary anastomosis.  Pt c/o difficulty swallowing.  Subjective: alert, irritable Assessment / Plan / Recommendation CHL IP CLINICAL IMPRESSIONS 05/16/2017 Clinical Impression Pt demonstrates no aspiration or penetration, Occasional throat clearing still observed during exam. Esophageal sweep revealed stasis, apperance of dysmotilty though no radiologist present to confirm. Encouraged pt to follow esophageal precautions with regular  diet, thin liquids, particularly following solids and pills with sufficient liquids. No SLP f/u needed acutely.  SLP Visit Diagnosis Dysphagia, unspecified (R13.10) Attention and concentration deficit following -- Frontal lobe and executive function deficit following -- Impact on safety and function Mild aspiration risk   CHL IP TREATMENT RECOMMENDATION 05/16/2017 Treatment Recommendations No treatment recommended at this time   Prognosis 05/14/2017 Prognosis for Safe Diet Advancement Good Barriers to Reach Goals -- Barriers/Prognosis Comment -- CHL IP DIET RECOMMENDATION 05/16/2017 SLP Diet Recommendations Regular solids;Thin liquid Liquid Administration via Cup;Straw Medication Administration Whole meds with puree Compensations Slow rate;Small sips/bites;Follow solids with liquid Postural Changes Seated upright at 90 degrees;Remain semi-upright after after feeds/meals (Comment)   CHL IP OTHER RECOMMENDATIONS 05/16/2017 Recommended Consults -- Oral Care Recommendations Oral care BID Other Recommendations --   CHL IP FOLLOW UP RECOMMENDATIONS 05/16/2017 Follow up Recommendations Skilled Nursing facility   [facility] IP FREQUENCY AND DURATION 05/14/2017 Speech Therapy Frequency (ACUTE ONLY) min 2x/week Treatment Duration 1 week      CHL IP ORAL PHASE 05/16/2017 Oral Phase WFL Oral - Pudding Teaspoon -- Oral - Pudding Cup -- Oral - Honey Teaspoon -- Oral - Honey Cup -- Oral - Nectar Teaspoon -- Oral - Nectar Cup -- Oral  - Nectar Straw -- Oral - Thin Teaspoon -- Oral - Thin Cup -- Oral - Thin Straw -- Oral - Puree -- Oral - Mech Soft -- Oral - Regular -- Oral - Multi-Consistency -- Oral - Pill -- Oral Phase - Comment --  CHL IP PHARYNGEAL PHASE 05/16/2017 Pharyngeal Phase WFL Pharyngeal- Pudding Teaspoon -- Pharyngeal -- Pharyngeal- Pudding Cup -- Pharyngeal -- Pharyngeal- Honey Teaspoon -- Pharyngeal -- Pharyngeal- Honey Cup -- Pharyngeal -- Pharyngeal- Nectar Teaspoon -- Pharyngeal -- Pharyngeal- Nectar Cup -- Pharyngeal -- Pharyngeal- Nectar Straw -- Pharyngeal -- Pharyngeal- Thin Teaspoon -- Pharyngeal -- Pharyngeal- Thin Cup -- Pharyngeal -- Pharyngeal- Thin Straw -- Pharyngeal -- Pharyngeal- Puree -- Pharyngeal -- Pharyngeal- Mechanical Soft -- Pharyngeal -- Pharyngeal- Regular -- Pharyngeal -- Pharyngeal- Multi-consistency -- Pharyngeal -- Pharyngeal- Pill -- Pharyngeal -- Pharyngeal Comment --  CHL IP CERVICAL ESOPHAGEAL PHASE 05/16/2017 Cervical Esophageal Phase WFL Pudding Teaspoon -- Pudding Cup -- Honey Teaspoon -- Honey Cup -- Nectar Teaspoon -- Nectar Cup -- Nectar Straw -- Thin Teaspoon -- Thin Cup -- Thin Straw -- Puree -- Mechanical Soft -- Regular -- Multi-consistency -- Pill -- Cervical Esophageal Comment -- No flowsheet data found. Herbie [place], [place] CCC-SLP [phone] Lynann Beaver 05/16/2017, 12:14 PM               Assessment and Plan:   1. Elevated Troponin/Chest pain -Patient complained of chest pain in the early postoperative period status post right carotid endarterectomy and redo one day later. -Troponins mildly elevated in a flat pattern-0.05, 0.05, 0.04 -EKG shows nonspecific ST/T changes that are in a similar pattern to previous but more pronounced -Chest pain is atypical without shortness of breath and troponins are only mildly elevated in flat trend. Do not think this is ACS. Recent normal Echo. No further cardiac evaluation. WIll work on rhythm control.  2. PAF -Management includes  Toprol XL 200 mg daily (patient has permanent pacemaker), and warfarin therapy as anticoagulation for stroke risk reduction -Status post right carotid endarterectomy on 7/3 and redo on 7/4. Okay to restart Coumadin per Dr. Trula Slade on 7/5. -Pt is in and out of atrial fib with rates in the 110's- 130's.  -Will discontinue amlodipine to make room for beta blocker.  Change toprol to metoprolol 50 mg QID while here to better control heart rate. Can try to switch back to her Toprol at discharge if improved.  3. Hypertension -Home medicines include amlodipine 10 mg daily, lisinopril 40 mg daily, Toprol-XL 200 mg daily -BP is soft here. Is only on Toprol 100 mg. Will stop amlodipine and increase BB.   4. Diabetes -Home management includes metformin and glimepiride   Signed, Daune Perch, NP  05/16/2017 12:31 PM   I have personally seen and examined this patient with Johna Roles, NP. I agree with the assessment and plan as outlined above. She is admitted for right carotid endarterectomy on 05/13/17 with redo emergent right carotid endarterectomy on 05/14/17. She has had non-sustained VT on tele and c/o chest pain. Troponin is very mildly abnormal (peak 0.05) with flat trend. EKG on 05/15/17 with sinus rhythm, TWI diffusely with ST depression. She has no known CAD. She is known to have sick sinus syndrome and a permanent pacemaker is in place. She has paroxysmal atrial fib and is on coumadin. Following her surgical procedures, she c/o chest pain. Mostly with moving her arms and when moving around. Some pain in neck. No chest pain today. No dyspnea.  My exam shows:  General: Elderly female, NAD  HEENT: OP clear, mucus membranes moist  SKIN: warm, dry. No rashes. Musculoskeletal: Muscle strength 5/5 all ext  Psychiatric: Mood and affect normal  Neck: No JVD  Lungs:Clear bilaterally, no wheezes, rhonci, crackles Cardiovascular: Irreg irreg with no murmurs Abdomen:Soft. Bowel sounds present. Non-tender.   Extremities: No lower extremity edema.  Plan:  1. Elevated troponin: Very mild elevation in setting of 2 surgeries with general anesthesia in a 83 yo old as well as recurrent atrial fib with RVR. Her chest pain is atypical. It is likely that she has some degree of CAD given history of PAD and advanced age but I do not think her current presentation is c/w acute coronary syndrome. She is on ASA, beta blocker and coumadin. I would not plan on an invasive evaluation at this time with recent surgery. She has been restarted on coumadin.  2. Atrial fib, paroxysmal: She is back on coumadin. HR up to 140 while moving from bed to toilet today. Will titrate beta blocker. Will change Toprol to Lopressor during acute illness (Lopressor 50 mg po QID). Will hold Norvasc due to soft BP and reduce Lisinopril dose to 20 mg daily.  3. NSVT: Titrate beta blocker as above.  We will follow along with you  Lauree Chandler 05/16/2017 1:48 PM

## 2017-05-16 NOTE — Clinical Social Work Note (Signed)
Clinical Social Work Assessment  Patient Details  Name: Kylie Sanchez MRN: [id number] Date of Birth: 30-Jan-1925  Date of referral:  05/16/17               Reason for consult:  Facility Placement, Discharge Planning                Permission sought to share information with:  Facility Sport and exercise psychologist, Family Supports Permission granted to share information::  Yes, Verbal Permission Granted  Name::     Astronomer::  SNF  Relationship::  Niece  Contact Information:     Housing/Transportation Living arrangements for the past 2 months:  Single Family Home Source of Information:  Patient, Other (Comment Required) (Niece) Patient Interpreter Needed:  None Criminal Activity/Legal Involvement Pertinent to Current Situation/Hospitalization:  No - Comment as needed Significant Relationships:  Other Family Members, Community Support Lives with:  Self Do you feel safe going back to the place where you live?  Yes Need for family participation in patient care:  Yes (Comment) (patient requested that niece, Sharyn Lull, be contacted to help with decisions)  Care giving concerns:  Patient has been residing at home with part-time support from caregivers, but needs 24 hour supervision and additional assistance with ADLs at this time in short term rehab prior to returning home.   Social Worker assessment / plan:  CSW introduced self to patient and caregiver, Audrea Muscat, at bedside. Patient and caregiver explained that patient had been living at home with part-time support, but the family was concerned about the patient's safety at this time. Patient and caregiver requested that CSW contact patient's niece, Sharyn Lull, by phone. CSW contacted patient's niece by phone to discuss discharge planning. [name] explained that she had spoken with the patient's doctor earlier today, and the doctor had discussed wanting the patient to go to inpatient rehab at the hospital. CSW explained that physical therapy  was recommending SNF, and that a SNF back-up is typically recommended just in case inpatient rehab doesn't work out. CSW explained that completing referral paperwork would not rule out inpatient rehab if the process was started. [name] indicated that patient would do better in inpatient rehab as opposed to SNF, but would be willing to look into options. [name] indicated that she did not want [facility] or [facility], because of poor past experiences, and requested looking into if [facility] had any availability. CSW will send out referrals and follow up with bed offers.  Employment status:  Retired Forensic scientist:  Medicare PT Recommendations:  [place] / Referral to community resources:  [place]  Patient/Family's Response to care:  Patient and family agreeable to SNF placement.  Patient/Family's Understanding of and Emotional Response to Diagnosis, Current Treatment, and Prognosis:  Patient and family seem to understand the patient's current medical status and the need for additional support at this time. Patient and family indicated understanding of CSW role in discharge planning.  Emotional Assessment Appearance:  Appears stated age Attitude/Demeanor/Rapport:    Affect (typically observed):  Appropriate Orientation:  Oriented to Situation, Oriented to  Time, Oriented to Place, Oriented to Self Alcohol / Substance use:  Not Applicable Psych involvement (Current and /or in the community):  No (Comment)  Discharge Needs  Concerns to be addressed:  Care Coordination, Discharge Planning Concerns Readmission within the last 30 days:  Yes Current discharge risk:  Lives alone, Physical Impairment Barriers to Discharge:  Continued Medical Work up   [employer],  LCSW 05/16/2017, 4:36 PM

## 2017-05-16 NOTE — Progress Notes (Signed)
Modified Barium Swallow Progress Note  Patient Details  Name: Arma Reining MRN: [id number] Date of Birth: 12/07/1924  Today's Date: 05/16/2017  Modified Barium Swallow completed.  Full report located under Chart Review in the Imaging Section.  Brief recommendations include the following:  Clinical Impression  Pt demonstrates no aspiration or penetration, Occasional throat clearing still observed during exam. Esophageal sweep revealed stasis, apperance of dysmotilty though no radiologist present to confirm. Encouraged pt to follow esophageal precautions with regular diet, thin liquids, particularly following solids and pills with sufficient liquids. No SLP f/u needed acutely.    Swallow Evaluation Recommendations       SLP Diet Recommendations: Regular solids;Thin liquid   Liquid Administration via: Cup;Straw   Medication Administration: Whole meds with puree   Supervision: Patient able to self feed   Compensations: Slow rate;Small sips/bites;Follow solids with liquid   Postural Changes: Seated upright at 90 degrees;Remain semi-upright after after feeds/meals (Comment)   Oral Care Recommendations: Oral care BID       Herbie Baltimore, MA CCC-SLP [phone]  Lynann Beaver 05/16/2017,12:13 PM

## 2017-05-16 NOTE — Anesthesia Postprocedure Evaluation (Signed)
Anesthesia Post Note  Patient: Genasis Zingale  Procedure(s) Performed: Procedure(s) (LRB): REDO ENDARTERECTOMY CAROTID Resection Redundant Internal Corotid Artery. (Right)     Patient location during evaluation: PACU Anesthesia Type: General Level of consciousness: awake and alert Pain management: pain level controlled Vital Signs Assessment: post-procedure vital signs reviewed and stable Respiratory status: spontaneous breathing, nonlabored ventilation, respiratory function stable and patient connected to nasal cannula oxygen Cardiovascular status: blood pressure returned to baseline and stable Postop Assessment: no signs of nausea or vomiting Anesthetic complications: no    Last Vitals:  Vitals:   05/16/17 0700 05/16/17 1218  BP: 98/60 (!) 96/50  Pulse: 91 98  Resp: 18 18  Temp: 36.7 C 36.5 C    Last Pain:  Vitals:   05/16/17 1218  TempSrc: Oral  PainSc:                  Khalel Alms S

## 2017-05-16 NOTE — Progress Notes (Signed)
[facility]  Subjective  - She really wants to go home not SNF   Objective 92/60 96 97.9 F (36.6 C) (Oral) (!) 21 100%  Intake/Output Summary (Last 24 hours) at 05/16/17 0724 Last data filed at 05/16/17 0500  Gross per 24 hour  Intake             1650 ml  Output              100 ml  Net             1550 ml    No neurologic deficits Incision clean and dry UE grip equal, sensation intact, speech clear   Assessment/Planning: POD # 3  female is s/p: right carotid endarterectomy 05/13/17 with subsequent occlusion of right internal carotid artery and left facial droop in PACU, redo carotid endarterectomy and resection of primary anastomosis   Pectoralis muscle discomfort post, not chest pain this am Coumadin re started  SLP Diet recommendations: Nectar-thick liquid;Dysphagia 1 (puree) Liquids provided via: Cup Medication Administration: Crushed with puree Supervision: Patient able to self feed Compensations: Slow rate;Small sips/bites;Effortful swallow;Multiple dry swallows after each bite/sip;Follow solids with liquid Postural Changes and/or Swallow Maneuvers: Out of bed for meals;Seated upright 90 degrees  Tranfer to 2W possible today I have asked nursing staff to ambulate with her. I have ordered HH sLP and PT she does not want to go to a SNF  Theda Sers [facility] 05/16/2017 7:24 AM --  Laboratory Lab Results:  Recent Labs  05/13/17 1136 05/14/17 0346  WBC  --  9.2  HGB 10.5* 8.5*  HCT 31.0* 26.2*  PLT  --  221   BMET  Recent Labs  05/13/17 1136 05/14/17 0346  NA 139 137  K 5.1 5.1  CL  --  110  CO2  --  20*  GLUCOSE 119* 175*  BUN  --  22*  CREATININE  --  1.07*  CALCIUM  --  8.3*    COAG Lab Results  Component Value Date   INR 1.04 05/16/2017   INR 1.03 05/13/2017   INR 0.98 05/07/2017   No results found for: PTT

## 2017-05-16 NOTE — Progress Notes (Signed)
Patient is active with [facility] services as prior to admission per Tiffany with [facility]; for HHRN/ PT/ OT; Attending MD please enter [place] orders for continuation of services at discharge; Aneta Mins [phone]

## 2017-05-16 NOTE — Progress Notes (Signed)
ANTICOAGULATION CONSULT NOTE - Initial Consult  Pharmacy Consult for warfarin Indication: atrial fibrillation  Allergies  Allergen Reactions  . No Known Allergies     Patient Measurements: Height: 5\' 2"  (157.5 cm) Weight: 150 lb 5.7 oz (68.2 kg) IBW/kg (Calculated) : 50.1  Vital Signs: Temp: 98 F (36.7 C) (07/06 0700) Temp Source: Oral (07/06 0700) BP: 98/60 (07/06 0700) Pulse Rate: 91 (07/06 0700)  Labs:  Recent Labs  05/13/17 1121 05/13/17 1136 05/13/17 2104 05/14/17 0346 05/14/17 1604 05/14/17 2208 05/15/17 0531 05/16/17 0216  HGB  --  10.5*  --  8.5*  --   --   --   --   HCT  --  31.0*  --  26.2*  --   --   --   --   PLT  --   --   --  221  --   --   --   --   LABPROT 13.5  --   --   --   --   --   --  13.7  INR 1.03  --   --   --   --   --   --  1.04  CREATININE  --   --   --  1.07*  --   --   --   --   CKTOTAL  --   --  19*  --  47  --   --   --   CKMB  --   --   --   --  2.7  --   --   --   TROPONINI  --   --   --   --  0.05* 0.05* 0.04*  --     Estimated Creatinine Clearance: 32.2 mL/min (A) (by C-G formula based on SCr of 1.07 mg/dL (H)).  Assessment: CC/HPI: 83 yo f s/p right carotid endarterectomy   PMH: CKD3, afib on warfarin, DM2  Anticoag: warfarin for afib. INR 1.03 on admit (no bridge necessary per [facility]) INR 1.04 (7/6)  Pta warfarin 5 mg MWF 2.5 mg AOD  Renal: SCr 1.07  Heme/Onc: H&H 8.5/26.2, Plt 221  Goal of Therapy:  INR 2-3 Monitor platelets by anticoagulation protocol: Yes   Plan:  Warfarin 5 mg x 1  Daily INR, CBC q72h   Bertis Ruddy, PharmD Pharmacy Resident Pager #: [phone] 05/16/2017 8:54 AM

## 2017-05-16 NOTE — OR Nursing (Signed)
Addendum created--patient never in Phase II in PACU so these times were removed.

## 2017-05-16 NOTE — NC FL2 (Signed)
[place] LEVEL OF CARE SCREENING TOOL     IDENTIFICATION  Patient Name: Kylie Sanchez Birthdate: 21-Sep-1925 Sex: female Admission Date (Current Location): 05/13/2017  [facility] Number:  Herbalist and Address:  [facility], [place] [address], [place], [place] [address]      Provider Number: [id number]  Attending Physician Name and Address:  Serafina Mitchell, MD  Relative Name and Phone Number:       Current Level of Care: Hospital Recommended Level of Care: [place] Prior Approval Number:    Date Approved/Denied:   PASRR Number: [id number]  Discharge Plan: SNF    Current Diagnoses: Patient Active Problem List   Diagnosis Date Noted  . Asymptomatic stenosis of right carotid artery 05/13/2017  . Syncope 04/16/2017  . DM (diabetes mellitus), type 2 ([place]) 04/16/2017  . H/O enucleation of left eyeball 10/30/2015  . Macular degeneration, right eye 10/30/2015  . Pseudophakia, right eye 10/30/2015  . BMI 25.0-25.9,adult 09/26/2015  . Chronic rhinitis 05/05/2015  . Basal cell carcinoma 02/10/2015  . DM neuropathy, type II diabetes mellitus ([place]) 02/10/2015  . Atrial fibrillation [I48.91] 02/03/2015  . Diabetes mellitus ([place]) 11/24/2014  . T2_NIDDM w/Stage 3 CKD (GFR 40 ml/min) 11/08/2014  . Vitamin D deficiency 11/08/2014  . Medication management 11/08/2014  . BP (high blood pressure) 11/08/2014  . Polypharmacy 11/08/2014  . Encounter for therapeutic drug monitoring 01/03/2014  . Hyperlipidemia   . Sick sinus syndrome ([place])   . Retinopathy   . Long term current use of anticoagulant therapy 04/30/2012  . History of anticoagulant therapy 04/30/2012  . Tachycardia-bradycardia ([place]) 04/19/2012  . PAF (paroxysmal atrial fibrillation) ([place]) 04/18/2011  . Abnormal finding on thyroid function test 04/18/2011  . HCD (hypertensive cardiovascular disease) 04/18/2011    Orientation RESPIRATION BLADDER Height  & Weight     Self, Time, Situation, Place  Normal Incontinent Weight: 150 lb 5.7 oz (68.2 kg) Height:  5\' 2"  (157.5 cm)  BEHAVIORAL SYMPTOMS/MOOD NEUROLOGICAL BOWEL NUTRITION STATUS      Continent Diet (puree)  AMBULATORY STATUS COMMUNICATION OF NEEDS Skin   Extensive Assist Verbally Skin abrasions                       Personal Care Assistance Level of Assistance  Bathing, Dressing Bathing Assistance: Maximum assistance   Dressing Assistance: Maximum assistance     Functional Limitations Info             SPECIAL CARE FACTORS FREQUENCY  PT (By licensed PT), OT (By licensed OT)     PT Frequency: 5x/wk OT Frequency: 5x/wk            Contractures      Additional Factors Info  Code Status, Allergies, Insulin Sliding Scale, Isolation Precautions Code Status Info: FULL Allergies Info: NKA   Insulin Sliding Scale Info: 3x/day Isolation Precautions Info: Contact precautions, MRSA     Current Medications (05/16/2017):  This is the current hospital active medication list Current Facility-Administered Medications  Medication Dose Route Frequency Provider Last Rate Last Dose  . 0.9 %  sodium chloride infusion  500 mL Intravenous Once PRN Virgina Jock A, PA-C      . 0.9 %  sodium chloride infusion   Intravenous Continuous Virgina Jock A, PA-C 50 mL/hr at 05/15/17 1900    . acetaminophen (TYLENOL) tablet 325-650 mg  325-650 mg Oral Q4H PRN Alvia Grove, PA-C  Or  . acetaminophen (TYLENOL) suppository 325-650 mg  325-650 mg Rectal Q4H PRN Alvia Grove, PA-C      . alum & mag hydroxide-simeth (MAALOX/MYLANTA) 200-200-20 MG/5ML suspension 15-30 mL  15-30 mL Oral Q2H PRN Alvia Grove, PA-C      . aspirin EC tablet 325 mg  325 mg Oral Daily Virgina Jock A, PA-C   325 mg at 05/16/17 1330  . docusate sodium (COLACE) capsule 100 mg  100 mg Oral Daily Virgina Jock A, PA-C   100 mg at 05/16/17 1330  . glimepiride (AMARYL) tablet 1 mg  1 mg Oral Q  breakfast Virgina Jock A, PA-C   1 mg at 05/16/17 0834  . guaiFENesin-dextromethorphan (ROBITUSSIN DM) 100-10 MG/5ML syrup 15 mL  15 mL Oral Q4H PRN Virgina Jock A, PA-C      . hydrALAZINE (APRESOLINE) injection 5 mg  5 mg Intravenous Q20 Min PRN Virgina Jock A, PA-C      . insulin aspart (novoLOG) injection 0-9 Units  0-9 Units Subcutaneous TID WC Trinh, Kimberly A, PA-C   1 Units at 05/15/17 1729  . labetalol (NORMODYNE,TRANDATE) injection 10 mg  10 mg Intravenous Q10 min PRN Alvia Grove, PA-C      . [START ON 05/17/2017] lisinopril (PRINIVIL,ZESTRIL) tablet 20 mg  20 mg Oral Daily Daune Perch, NP      . magnesium sulfate IVPB 2 g 50 mL  2 g Intravenous Daily PRN Virgina Jock A, PA-C      . MEDLINE mouth rinse  15 mL Mouth Rinse BID Serafina Mitchell, MD   15 mL at 05/15/17 2126  . metFORMIN (GLUCOPHAGE-XR) 24 hr tablet 500 mg  500 mg Oral Q breakfast Virgina Jock A, PA-C   500 mg at 05/16/17 0834  . metoprolol tartrate (LOPRESSOR) injection 2-5 mg  2-5 mg Intravenous Q2H PRN Alvia Grove, PA-C      . [START ON 05/17/2017] metoprolol tartrate (LOPRESSOR) tablet 50 mg  50 mg Oral Q6H Daune Perch, NP      . morphine 2 MG/ML injection 1-3 mg  1-3 mg Intravenous Q1H PRN Virgina Jock A, PA-C   2 mg at 05/15/17 0059  . ondansetron (ZOFRAN) injection 4 mg  4 mg Intravenous Q6H PRN Virgina Jock A, PA-C   4 mg at 05/13/17 1939  . oxyCODONE-acetaminophen (PERCOCET/ROXICET) 5-325 MG per tablet 1-2 tablet  1-2 tablet Oral Q4H PRN Virgina Jock A, PA-C      . pantoprazole (PROTONIX) EC tablet 40 mg  40 mg Oral Daily Virgina Jock A, PA-C   40 mg at 05/16/17 1330  . phenol (CHLORASEPTIC) mouth spray 1 spray  1 spray Mouth/Throat PRN Virgina Jock A, PA-C      . potassium chloride SA (K-DUR,KLOR-CON) CR tablet 20-40 mEq  20-40 mEq Oral Daily PRN Trinh, Kimberly A, PA-C      . RESOURCE THICKENUP CLEAR   Oral Once Rhyne, Samantha J, PA-C      . warfarin (COUMADIN) tablet 5  mg  5 mg Oral ONCE-1800 Carnella [place], RPH      . Warfarin - Pharmacist Dosing Inpatient   Does not apply q1800 Wynell [facility]oon [facility]         Discharge Medications: Please see discharge summary for a list of discharge medications.  Relevant Imaging Results:  Relevant Lab Results:   Additional Information SS#: [id number]  Geralynn Ochs, LCSW

## 2017-05-17 DIAGNOSIS — R269 Unspecified abnormalities of gait and mobility: Secondary | ICD-10-CM

## 2017-05-17 LAB — GLUCOSE, CAPILLARY
GLUCOSE-CAPILLARY: 147 mg/dL — AB (ref 65–99)
Glucose-Capillary: 116 mg/dL — ABNORMAL HIGH (ref 65–99)
Glucose-Capillary: 129 mg/dL — ABNORMAL HIGH (ref 65–99)
Glucose-Capillary: 158 mg/dL — ABNORMAL HIGH (ref 65–99)

## 2017-05-17 LAB — CBC
HEMATOCRIT: 24.8 % — AB (ref 36.0–46.0)
HEMOGLOBIN: 7.7 g/dL — AB (ref 12.0–15.0)
MCH: 29.2 pg (ref 26.0–34.0)
MCHC: 31 g/dL (ref 30.0–36.0)
MCV: 93.9 fL (ref 78.0–100.0)
Platelets: 261 10*3/uL (ref 150–400)
RBC: 2.64 MIL/uL — AB (ref 3.87–5.11)
RDW: 13.9 % (ref 11.5–15.5)
WBC: 7.9 10*3/uL (ref 4.0–10.5)

## 2017-05-17 LAB — PROTIME-INR
INR: 1.24
Prothrombin Time: 15.7 seconds — ABNORMAL HIGH (ref 11.4–15.2)

## 2017-05-17 MED ORDER — SODIUM CHLORIDE 0.9 % IV BOLUS (SEPSIS)
500.0000 mL | Freq: Once | INTRAVENOUS | Status: AC
  Administered 2017-05-17: 500 mL via INTRAVENOUS

## 2017-05-17 MED ORDER — POLYETHYLENE GLYCOL 3350 17 G PO PACK
17.0000 g | PACK | Freq: Once | ORAL | Status: AC
  Administered 2017-05-17: 17 g via ORAL
  Filled 2017-05-17: qty 1

## 2017-05-17 MED ORDER — LISINOPRIL 5 MG PO TABS: 5.0000 mg | ORAL_TABLET | Freq: Every day | ORAL | Status: DC

## 2017-05-17 MED ORDER — BISACODYL 5 MG PO TBEC
5.0000 mg | DELAYED_RELEASE_TABLET | Freq: Every day | ORAL | Status: DC | PRN
  Administered 2017-05-17: 5 mg via ORAL
  Filled 2017-05-17: qty 1

## 2017-05-17 MED ORDER — WARFARIN SODIUM 5 MG PO TABS
5.0000 mg | ORAL_TABLET | Freq: Once | ORAL | Status: AC
  Administered 2017-05-17: 5 mg via ORAL
  Filled 2017-05-17: qty 1

## 2017-05-17 NOTE — Progress Notes (Addendum)
Progress Note  Patient Name: Kylie Sanchez Date of Encounter: 05/17/2017  Primary Cardiologist: Allred  Subjective   Feeling tired. Pain in head, neck, chest, abdomen.   Inpatient Medications    Scheduled Meds: . aspirin EC  325 mg Oral Daily  . docusate sodium  100 mg Oral Daily  . glimepiride  1 mg Oral Q breakfast  . insulin aspart  0-9 Units Subcutaneous TID WC  . lisinopril  20 mg Oral Daily  . metFORMIN  500 mg Oral Q breakfast  . metoprolol tartrate  50 mg Oral Q6H  . pantoprazole  40 mg Oral Daily  . [place]   Oral Once  . Warfarin - Pharmacist Dosing Inpatient   Does not apply q1800   Continuous Infusions: . sodium chloride    . sodium chloride 50 mL/hr at 05/17/17 0951  . magnesium sulfate 1 - 4 g bolus IVPB    . [COMPLETED] sodium chloride     PRN Meds: sodium chloride, acetaminophen **OR** acetaminophen, alum & mag hydroxide-simeth, bisacodyl, guaiFENesin-dextromethorphan, hydrALAZINE, labetalol, magnesium sulfate 1 - 4 g bolus IVPB, metoprolol tartrate, morphine injection, ondansetron, oxyCODONE-acetaminophen, phenol, potassium chloride   Vital Signs    Vitals:   05/17/17 0353 05/17/17 0808 05/17/17 0956 05/17/17 1009  BP: (!) 107/52 (!) 100/54 (!) 87/52 (!) 72/43  Pulse: 83 91 100 96  Resp: 19 18 14 17   Temp: 98.2 F (36.8 C) 97.9 F (36.6 C)    TempSrc: Oral Oral    SpO2: 95% 95% 97% 91%  Weight:      Height:        Intake/Output Summary (Last 24 hours) at 05/17/17 1039 Last data filed at 05/17/17 0951  Gross per 24 hour  Intake           1612.5 ml  Output              150 ml  Net           1462.5 ml   Filed Weights   05/13/17 1115 05/14/17 0232  Weight: 140 lb (63.5 kg) 150 lb 5.7 oz (68.2 kg)    Telemetry    Atrial fibrillation - Personally Reviewed  ECG    None recent - Personally Reviewed  Physical Exam   GEN: No acute distress.   Neck: No JVD Cardiac: iRRR, no murmurs, rubs, or gallops.  Respiratory: Clear  to auscultation bilaterally. GI: Soft, nontender, non-distended  MS: No edema; No deformity. Neuro:  Nonfocal  Psych: Normal affect   Labs    Chemistry Recent Labs Lab 05/13/17 1136 05/14/17 0346  NA 139 137  K 5.1 5.1  CL  --  110  CO2  --  20*  GLUCOSE 119* 175*  BUN  --  22*  CREATININE  --  1.07*  CALCIUM  --  8.3*  GFRNONAA  --  45*  GFRAA  --  52*  ANIONGAP  --  7     Hematology Recent Labs Lab 05/13/17 1136 05/14/17 0346 05/17/17 0526  WBC  --  9.2 7.9  RBC  --  2.86* 2.64*  HGB 10.5* 8.5* 7.7*  HCT 31.0* 26.2* 24.8*  MCV  --  91.6 93.9  MCH  --  29.7 29.2  MCHC  --  32.4 31.0  RDW  --  12.8 13.9  PLT  --  221 261    Cardiac Enzymes Recent Labs Lab 05/14/17 1604 05/14/17 2208 05/15/17 0531  TROPONINI 0.05* 0.05* 0.04*  No results for input(s): TROPIPOC in the last 168 hours.   BNPNo results for input(s): BNP, PROBNP in the last 168 hours.   DDimer No results for input(s): DDIMER in the last 168 hours.   Radiology    Dg Swallowing Func-speech Pathology  Result Date: 05/16/2017 Objective Swallowing Evaluation: Type of Study: MBS-Modified Barium Swallow Study Patient Details Name: Kylie Sanchez MRN: [id number] Date of Birth: June 08, 1925 Today's Date: 05/16/2017 Time: SLP Start Time (ACUTE ONLY): 1057-SLP Stop Time (ACUTE ONLY): 1117 SLP Time Calculation (min) (ACUTE ONLY): 20 min Past Medical History: Past Medical History: Diagnosis Date . Cancer ([place]) 2015  left eye . Cataract  . CKD (chronic kidney disease) stage 3, GFR 30-59 ml/min   due to DM . Dizziness  . Hyperlipidemia  . Hypertension  . Hypertensive cardiovascular disease  . Impaired vision  . Paroxysmal atrial fibrillation (HCC)  . Presence of permanent cardiac pacemaker  . Retinopathy  . Sick sinus syndrome ([place])  . Type II or unspecified type diabetes mellitus without mention of complication, not stated as uncontrolled  Past Surgical History: Past Surgical History: Procedure Laterality Date . DENTAL  SURGERY   . ENDARTERECTOMY Right 05/13/2017  Procedure: REDO ENDARTERECTOMY CAROTID Resection Redundant Internal Corotid Artery.;  Surgeon: Angelia Mould, MD;  Location: [facility];  Service: Vascular;  Laterality: Right; . ENDARTERECTOMY Right 05/13/2017  Procedure: ENDARTERECTOMY CAROTID-RIGHT;  Surgeon: Serafina Mitchell, MD;  [facility];  Service: Vascular;  Laterality: Right; . EYE SURGERY Left   tumor and eye removed . PACEMAKER INSERTION  04/20/12  MDT Adapta L implanted by Dr Rayann Heman . PATCH ANGIOPLASTY Right 05/13/2017  Procedure: PATCH ANGIOPLASTY USING Rueben Bash BIOLOGIC PATCH;  Surgeon: Serafina Mitchell, MD;  Location: [facility] OR;  Service: Vascular;  Laterality: Right; . PERMANENT PACEMAKER INSERTION N/A 04/20/2012  Procedure: PERMANENT PACEMAKER INSERTION;  Surgeon: Thompson Grayer, MD;  Location: [facility] CATH LAB;  Service: Cardiovascular;  Laterality: N/A; HPI: 83 y.o. female with hx of DM, SSS, afib admitted for scheduled right CEA 7/3.  Post-operatively, pt developed left facial asymmetry.  CT showed abrupt occlusion ICA at distal end of patch; pt taken back to OR for resection with primary anastomosis.  Pt c/o difficulty swallowing.  Subjective: alert, irritable Assessment / Plan / Recommendation CHL IP CLINICAL IMPRESSIONS 05/16/2017 Clinical Impression Pt demonstrates no aspiration or penetration, Occasional throat clearing still observed during exam. Esophageal sweep revealed stasis, apperance of dysmotilty though no radiologist present to confirm. Encouraged pt to follow esophageal precautions with regular diet, thin liquids, particularly following solids and pills with sufficient liquids. No SLP f/u needed acutely.  SLP Visit Diagnosis Dysphagia, unspecified (R13.10) Attention and concentration deficit following -- Frontal lobe and executive function deficit following -- Impact on safety and function Mild aspiration risk   CHL IP TREATMENT RECOMMENDATION 05/16/2017 Treatment Recommendations No treatment  recommended at this time   Prognosis 05/14/2017 Prognosis for Safe Diet Advancement Good Barriers to Reach Goals -- Barriers/Prognosis Comment -- CHL IP DIET RECOMMENDATION 05/16/2017 SLP Diet Recommendations Regular solids;Thin liquid Liquid Administration via Cup;Straw Medication Administration Whole meds with puree Compensations Slow rate;Small sips/bites;Follow solids with liquid Postural Changes Seated upright at 90 degrees;Remain semi-upright after after feeds/meals (Comment)   CHL IP OTHER RECOMMENDATIONS 05/16/2017 Recommended Consults -- Oral Care Recommendations Oral care BID Other Recommendations --   CHL IP FOLLOW UP RECOMMENDATIONS 05/16/2017 Follow up Recommendations Skilled Nursing facility   [facility] IP FREQUENCY AND DURATION 05/14/2017 Speech Therapy Frequency (ACUTE ONLY) min 2x/week Treatment Duration  1 week      CHL IP ORAL PHASE 05/16/2017 Oral Phase WFL Oral - Pudding Teaspoon -- Oral - Pudding Cup -- Oral - Honey Teaspoon -- Oral - Honey Cup -- Oral - Nectar Teaspoon -- Oral - Nectar Cup -- Oral - Nectar Straw -- Oral - Thin Teaspoon -- Oral - Thin Cup -- Oral - Thin Straw -- Oral - Puree -- Oral - Mech Soft -- Oral - Regular -- Oral - Multi-Consistency -- Oral - Pill -- Oral Phase - Comment --  CHL IP PHARYNGEAL PHASE 05/16/2017 Pharyngeal Phase WFL Pharyngeal- Pudding Teaspoon -- Pharyngeal -- Pharyngeal- Pudding Cup -- Pharyngeal -- Pharyngeal- Honey Teaspoon -- Pharyngeal -- Pharyngeal- Honey Cup -- Pharyngeal -- Pharyngeal- Nectar Teaspoon -- Pharyngeal -- Pharyngeal- Nectar Cup -- Pharyngeal -- Pharyngeal- Nectar Straw -- Pharyngeal -- Pharyngeal- Thin Teaspoon -- Pharyngeal -- Pharyngeal- Thin Cup -- Pharyngeal -- Pharyngeal- Thin Straw -- Pharyngeal -- Pharyngeal- Puree -- Pharyngeal -- Pharyngeal- Mechanical Soft -- Pharyngeal -- Pharyngeal- Regular -- Pharyngeal -- Pharyngeal- Multi-consistency -- Pharyngeal -- Pharyngeal- Pill -- Pharyngeal -- Pharyngeal Comment --  CHL IP CERVICAL ESOPHAGEAL PHASE  05/16/2017 Cervical Esophageal Phase WFL Pudding Teaspoon -- Pudding Cup -- Honey Teaspoon -- Honey Cup -- Nectar Teaspoon -- Nectar Cup -- Nectar Straw -- Thin Teaspoon -- Thin Cup -- Thin Straw -- Puree -- Mechanical Soft -- Regular -- Multi-consistency -- Pill -- Cervical Esophageal Comment -- No flowsheet data found. Herbie [place], [place] CCC-SLP [phone] Lynann Beaver 05/16/2017, 12:14 PM               Cardiac Studies   TTE - Left ventricle: The cavity size was normal. Wall thickness was   increased in a pattern of severe LVH. Systolic function was   normal. The estimated ejection fraction was in the range of 55%   to 60%. Wall motion was normal; there were no regional wall   motion abnormalities. Doppler parameters are consistent with   abnormal left ventricular relaxation (grade 1 diastolic   dysfunction). - Aortic valve: Small mobile calcium deposit seen on PSL below   aortic valve near base of anterior leaflet. - Mitral valve: Calcified annulus. Mildly thickened leaflets . - Atrial septum: No defect or patent foramen ovale was identified. - Pulmonary arteries: PA peak pressure: 32 mm Hg (S).  Patient Profile     83 y.o. female hx of PAF on warfarin, symptomatic bradycardia with PPM ([employer]), diabetes type 2, hypertension, hyperlipidemia who is being seen today for the evaluation of chest pain and elevated troponins at the request of Dr. Trula Slade. Pt is S/P right carotid endarterectomy on 05/13/17 and redo on 05/14/17.   Assessment & Plan    1. Elevated troponin: Troponin elevation mild and possibly due to stress of two surgeries and atrial fibrillation. Unlikely due to ACS. on ASA, beta blocker and coumadin. No changes 2. Atrial fib, paroxysmal: On coumadin. HR up to 120 with ambulation but well controlled at rest around 100. BP is low and Zian Delair not titrate beta blockers at this time 3. NSVT: Amahd Morino not increase BB due to low BP. Getting 500 cc bolus. 4. Hypotension: getting 500cc  bolus. Lashan Gluth decrease lisinopril to 5 mg. Can be titrated up as necessary.  Signed, Kerem Gilmer Meredith Leeds, MD  05/17/2017, 10:39 AM

## 2017-05-17 NOTE — Progress Notes (Addendum)
[facility]  Subjective  - Frustrated.  She does not want to go SNF.   Objective (!) 100/54 83 97.9 F (36.6 C) (Oral) 19 95%  Intake/Output Summary (Last 24 hours) at 05/17/17 0849 Last data filed at 05/17/17 0600  Gross per 24 hour  Intake             1300 ml  Output              150 ml  Net             1150 ml   Right neck incision healing well No neurologic deficit  Grip 5/5 Heart A fib irregular rhythm   Assessment/Planning: POD # 4 femaleis s/p: right carotid endarterectomy 05/13/17 with subsequent occlusion of right internal carotid artery and left facial droop in PACU, redo carotid endarterectomy and resection of primary anastomosis   Swallow study  SLP Diet Recommendations: Regular solids;Thin liquid Cardiology Plan:  1. Elevated troponin: Very mild elevation in setting of 2 surgeries with general anesthesia in a 83 yo old as well as recurrent atrial fib with RVR. Her chest pain is atypical. It is likely that she has some degree of CAD given history of PAD and advanced age but I do not think her current presentation is c/w acute coronary syndrome. She is on ASA, beta blocker and coumadin. I would not plan on an invasive evaluation at this time with recent surgery. She has been restarted on coumadin.  2. Atrial fib, paroxysmal: She is back on coumadin. HR up to 140 while moving from bed to toilet today. Will titrate beta blocker. Will change Toprol to Lopressor during acute illness (Lopressor 50 mg po QID). Will hold Norvasc due to soft BP and reduce Lisinopril dose to 20 mg daily.  3. NSVT: Titrate beta blocker as above.  We will follow along with you  Dulcolax ordered for constipation Coumadin restarted  Patient does not want to go to SNF.  Called with report from nursing that she was ambulating poorly and had hypotension with BP drop systolic 67'E will give 720 cc saline bolus.  [facility] 05/17/2017 8:49 AM -- Agree  with above.  Stable from carotid standpoint. Rx constipation Disposition still planning Anemic Hgb 7.7 may need transfusion if BP does not improve  Ruta Hinds, MD [facility]: [phone] Pager: [phone]  Laboratory Lab Results:  Recent Labs  05/17/17 0526  WBC 7.9  HGB 7.7*  HCT 24.8*  PLT 261   BMET No results for input(s): NA, K, CL, CO2, GLUCOSE, BUN, CREATININE, CALCIUM in the last 72 hours.  COAG Lab Results  Component Value Date   INR 1.24 05/17/2017   INR 1.04 05/16/2017   INR 1.03 05/13/2017   No results found for: PTT

## 2017-05-17 NOTE — Consult Note (Signed)
Physical Medicine and Rehabilitation Consult Reason for Consult:weakness Referring Physician: brabham   HPI: Kylie Sanchez is a 83 y.o. female admitted for right CEA which was performed won 7/3. Post-op she developed left facial weakness. Follow up CT showed occlusion of ICA at distal end of patch. She was taken back to OR for resection of the primary anastomosis. PM&R was asked to see patient regarding inpatient rehab needs.    Review of Systems  Unable to perform ROS: Mental acuity   Past Medical History:  Diagnosis Date  . Cancer ([place]) 2015   left eye  . Cataract   . CKD (chronic kidney disease) stage 3, GFR 30-59 ml/min    due to DM  . Dizziness   . Hyperlipidemia   . Hypertension   . Hypertensive cardiovascular disease   . Impaired vision   . Paroxysmal atrial fibrillation (HCC)   . Presence of permanent cardiac pacemaker   . Retinopathy   . Sick sinus syndrome ([place])   . Type II or unspecified type diabetes mellitus without mention of complication, not stated as uncontrolled    Past Surgical History:  Procedure Laterality Date  . DENTAL SURGERY    . ENDARTERECTOMY Right 05/13/2017   Procedure: REDO ENDARTERECTOMY CAROTID Resection Redundant Internal Corotid Artery.;  Surgeon: Angelia Mould, MD;  Location: [place];  Service: Vascular;  Laterality: Right;  . ENDARTERECTOMY Right 05/13/2017   Procedure: ENDARTERECTOMY CAROTID-RIGHT;  Surgeon: Serafina Mitchell, MD;  Location: [facility] OR;  Service: Vascular;  Laterality: Right;  . EYE SURGERY Left    tumor and eye removed  . PACEMAKER INSERTION  04/20/12   MDT Adapta L implanted by Dr Rayann Heman  . PATCH ANGIOPLASTY Right 05/13/2017   Procedure: PATCH ANGIOPLASTY USING Rueben Bash BIOLOGIC PATCH;  Surgeon: Serafina Mitchell, MD;  Location: [facility];  Service: Vascular;  Laterality: Right;  . PERMANENT PACEMAKER INSERTION N/A 04/20/2012   Procedure: PERMANENT PACEMAKER INSERTION;  Surgeon: Thompson Grayer, MD;  Location: [facility] CATH LAB;   Service: Cardiovascular;  Laterality: N/A;   Family History  Problem Relation Age of Onset  . Heart attack Father   . Heart disease Father   . Hypertension Mother   . Heart disease Mother   . Cancer Other    Social History:  reports that she has never smoked. She has never used smokeless tobacco. She reports that she does not drink alcohol or use drugs. Allergies:  Allergies  Allergen Reactions  . No Known Allergies    Medications Prior to Admission  Medication Sig Dispense Refill  . amLODipine (NORVASC) 10 MG tablet TAKE ONE (1) TABLET BY MOUTH EVERY DAY (OVERDUE FOR AN APPTMT. CALL TO SCHEDULE) (Patient taking differently: Take 10 mg by mouth daily. ) 90 tablet 1  . b complex vitamins tablet Take 1 tablet by mouth daily.    . Cholecalciferol (VITAMIN D PO) Take 1 tablet by mouth every evening.     . Coenzyme Q10 (CO Q 10) 100 MG CAPS Take 100 mg by mouth every evening.     Marland Kitchen glimepiride (AMARYL) 2 MG tablet Take 1-2 mg by mouth daily with breakfast.    . lisinopril (PRINIVIL,ZESTRIL) 40 MG tablet Take 1 tablet (40 mg total) by mouth daily. 90 tablet 1  . Magnesium 250 MG TABS Take 250 mg by mouth every evening.     . metFORMIN (GLUCOPHAGE-XR) 500 MG 24 hr tablet Take 1-2 tablets by mouth every morning. Take with meal for diabetes    .  metoprolol succinate (TOPROL-XL) 200 MG 24 hr tablet Take 1 tablet every morning for BP & heart (Patient taking differently: Take 100 mg by mouth daily. ) 90 tablet 1  . nitroGLYCERIN (NITROSTAT) 0.4 MG SL tablet Place 1 tablet (0.4 mg total) under the tongue every 5 (five) minutes as needed for chest pain. 100 tablet 3  . warfarin (COUMADIN) 5 MG tablet Take 2.5-5 mg by mouth See admin instructions. Take 5 mg by mouth daily on Monday, Wednesday, Friday and take 2.5 mg by mouth on Sunday, Tuesday, Thursday, Saturday      Home: Home Living Family/patient expects to be discharged to:: Private residence Living Arrangements: Alone Available Help at  Discharge: Family, Available PRN/intermittently, Other (Comment) (nursing 9-2 daily,possibility for overnight care) Type of Home: Apartment Home Access: Level entry, Elevator Home Layout: One level Bathroom Shower/Tub: Chiropodist: Standard Bathroom Accessibility: Yes Home Equipment: Environmental consultant - 2 wheels, Shower seat  Functional History: Prior Function Level of Independence: Needs assistance Gait / Transfers Assistance Needed: uses RW limited household ambulation ADL's / Homemaking Assistance Needed: independent with ADLS, assist with iADLs Functional Status:  Mobility: Bed Mobility General bed mobility comments: in recliner at entry Transfers Overall transfer level: Needs assistance Equipment used: Rolling walker (2 wheeled) Transfers: Sit to/from Stand Sit to Stand: Min assist General transfer comment: minA for power up, vc for hand placement Ambulation/Gait Ambulation/Gait assistance: Min assist Ambulation Distance (Feet): 18 Feet Assistive device: Rolling walker (2 wheeled) Gait Pattern/deviations: Step-through pattern, Shuffle, Trunk flexed, Decreased stride length General Gait Details: minA for steadying, vc for staying inside walker and for walker management, minor difficulty for navigating obstacles in L eye visual field  Gait velocity: slowed Gait velocity interpretation: Below normal speed for age/gender    ADL:    Cognition: Cognition Overall Cognitive Status: Within Functional Limits for tasks assessed Orientation Level: Oriented X4 Cognition Arousal/Alertness: Awake/alert Behavior During Therapy: Flat affect, WFL for tasks assessed/performed Overall Cognitive Status: Within Functional Limits for tasks assessed  Blood pressure (!) 72/43, pulse 96, temperature 97.9 F (36.6 C), temperature source Oral, resp. rate 17, height 5\' 2"  (1.575 m), weight 68.2 kg (150 lb 5.7 oz), SpO2 91 %. Physical Exam  Constitutional:  Frail appearing  Eyes:    Left eye enucleation. Poor vision through right eye  Neck:  Right neck incision intact/swollen  Cardiovascular: Normal rate.   Respiratory: Effort normal.  GI: She exhibits no distension.  Neurological:  Sedated. Difficulty maintain focus. Head frequently fell forward. Moves all 4 limbs with prodding.   Psychiatric:  flat    Results for orders placed or performed during the hospital encounter of 05/13/17 (from the past 24 hour(s))  Glucose, capillary     Status: Abnormal   Collection Time: 05/16/17 12:17 PM  Result Value Ref Range   Glucose-Capillary 137 (H) 65 - 99 mg/dL   Comment 1 Notify RN    Comment 2 Document in Chart   Glucose, capillary     Status: Abnormal   Collection Time: 05/16/17  5:08 PM  Result Value Ref Range   Glucose-Capillary 111 (H) 65 - 99 mg/dL   Comment 1 Notify RN   Glucose, capillary     Status: Abnormal   Collection Time: 05/16/17  9:32 PM  Result Value Ref Range   Glucose-Capillary 116 (H) 65 - 99 mg/dL   Comment 1 Notify RN   Protime-INR     Status: Abnormal   Collection Time: 05/17/17  5:26 AM  Result  Value Ref Range   Prothrombin Time 15.7 (H) 11.4 - 15.2 seconds   INR 1.24   CBC     Status: Abnormal   Collection Time: 05/17/17  5:26 AM  Result Value Ref Range   WBC 7.9 4.0 - 10.5 K/uL   RBC 2.64 (L) 3.87 - 5.11 MIL/uL   Hemoglobin 7.7 (L) 12.0 - 15.0 g/dL   HCT 24.8 (L) 36.0 - 46.0 %   MCV 93.9 78.0 - 100.0 fL   MCH 29.2 26.0 - 34.0 pg   MCHC 31.0 30.0 - 36.0 g/dL   RDW 13.9 11.5 - 15.5 %   Platelets 261 150 - 400 K/uL  Glucose, capillary     Status: Abnormal   Collection Time: 05/17/17  8:06 AM  Result Value Ref Range   Glucose-Capillary 116 (H) 65 - 99 mg/dL   Comment 1 Notify RN    Comment 2 Document in Chart    Dg Swallowing Func-speech Pathology  Result Date: 05/16/2017 Objective Swallowing Evaluation: Type of Study: MBS-Modified Barium Swallow Study Patient Details Name: Kylie Sanchez MRN: [id number] Date of Birth: October 25, 1925  Today's Date: 05/16/2017 Time: SLP Start Time (ACUTE ONLY): 1057-SLP Stop Time (ACUTE ONLY): 1117 SLP Time Calculation (min) (ACUTE ONLY): 20 min Past Medical History: Past Medical History: Diagnosis Date . Cancer ([place]) 2015  left eye . Cataract  . CKD (chronic kidney disease) stage 3, GFR 30-59 ml/min   due to DM . Dizziness  . Hyperlipidemia  . Hypertension  . Hypertensive cardiovascular disease  . Impaired vision  . Paroxysmal atrial fibrillation (HCC)  . Presence of permanent cardiac pacemaker  . Retinopathy  . Sick sinus syndrome ([place])  . Type II or unspecified type diabetes mellitus without mention of complication, not stated as uncontrolled  Past Surgical History: Past Surgical History: Procedure Laterality Date . DENTAL SURGERY   . ENDARTERECTOMY Right 05/13/2017  Procedure: REDO ENDARTERECTOMY CAROTID Resection Redundant Internal Corotid Artery.;  Surgeon: Angelia Mould, MD;  Location: [facility];  Service: Vascular;  Laterality: Right; . ENDARTERECTOMY Right 05/13/2017  Procedure: ENDARTERECTOMY CAROTID-RIGHT;  Surgeon: Serafina Mitchell, MD;  Location: [facility] OR;  Service: Vascular;  Laterality: Right; . EYE SURGERY Left   tumor and eye removed . PACEMAKER INSERTION  04/20/12  MDT Adapta L implanted by Dr Rayann Heman . PATCH ANGIOPLASTY Right 05/13/2017  Procedure: PATCH ANGIOPLASTY USING Rueben Bash BIOLOGIC PATCH;  Surgeon: Serafina Mitchell, MD;  Location: [facility] OR;  Service: Vascular;  Laterality: Right; . PERMANENT PACEMAKER INSERTION N/A 04/20/2012  Procedure: PERMANENT PACEMAKER INSERTION;  Surgeon: Thompson Grayer, MD;  Location: [facility] CATH LAB;  Service: Cardiovascular;  Laterality: N/A; HPI: 83 y.o. female with hx of DM, SSS, afib admitted for scheduled right CEA 7/3.  Post-operatively, pt developed left facial asymmetry.  CT showed abrupt occlusion ICA at distal end of patch; pt taken back to OR for resection with primary anastomosis.  Pt c/o difficulty swallowing.  Subjective: alert, irritable Assessment / Plan /  Recommendation CHL IP CLINICAL IMPRESSIONS 05/16/2017 Clinical Impression Pt demonstrates no aspiration or penetration, Occasional throat clearing still observed during exam. Esophageal sweep revealed stasis, apperance of dysmotilty though no radiologist present to confirm. Encouraged pt to follow esophageal precautions with regular diet, thin liquids, particularly following solids and pills with sufficient liquids. No SLP f/u needed acutely.  SLP Visit Diagnosis Dysphagia, unspecified (R13.10) Attention and concentration deficit following -- Frontal lobe and executive function deficit following -- Impact on safety and function Mild aspiration risk   CHL  IP TREATMENT RECOMMENDATION 05/16/2017 Treatment Recommendations No treatment recommended at this time   Prognosis 05/14/2017 Prognosis for Safe Diet Advancement Good Barriers to Reach Goals -- Barriers/Prognosis Comment -- CHL IP DIET RECOMMENDATION 05/16/2017 SLP Diet Recommendations Regular solids;Thin liquid Liquid Administration via Cup;Straw Medication Administration Whole meds with puree Compensations Slow rate;Small sips/bites;Follow solids with liquid Postural Changes Seated upright at 90 degrees;Remain semi-upright after after feeds/meals (Comment)   CHL IP OTHER RECOMMENDATIONS 05/16/2017 Recommended Consults -- Oral Care Recommendations Oral care BID Other Recommendations --   CHL IP FOLLOW UP RECOMMENDATIONS 05/16/2017 Follow up Recommendations Skilled Nursing facility   [facility] IP FREQUENCY AND DURATION 05/14/2017 Speech Therapy Frequency (ACUTE ONLY) min 2x/week Treatment Duration 1 week      CHL IP ORAL PHASE 05/16/2017 Oral Phase WFL Oral - Pudding Teaspoon -- Oral - Pudding Cup -- Oral - Honey Teaspoon -- Oral - Honey Cup -- Oral - Nectar Teaspoon -- Oral - Nectar Cup -- Oral - Nectar Straw -- Oral - Thin Teaspoon -- Oral - Thin Cup -- Oral - Thin Straw -- Oral - Puree -- Oral - Mech Soft -- Oral - Regular -- Oral - Multi-Consistency -- Oral - Pill -- Oral Phase -  Comment --  CHL IP PHARYNGEAL PHASE 05/16/2017 Pharyngeal Phase WFL Pharyngeal- Pudding Teaspoon -- Pharyngeal -- Pharyngeal- Pudding Cup -- Pharyngeal -- Pharyngeal- Honey Teaspoon -- Pharyngeal -- Pharyngeal- Honey Cup -- Pharyngeal -- Pharyngeal- Nectar Teaspoon -- Pharyngeal -- Pharyngeal- Nectar Cup -- Pharyngeal -- Pharyngeal- Nectar Straw -- Pharyngeal -- Pharyngeal- Thin Teaspoon -- Pharyngeal -- Pharyngeal- Thin Cup -- Pharyngeal -- Pharyngeal- Thin Straw -- Pharyngeal -- Pharyngeal- Puree -- Pharyngeal -- Pharyngeal- Mechanical Soft -- Pharyngeal -- Pharyngeal- Regular -- Pharyngeal -- Pharyngeal- Multi-consistency -- Pharyngeal -- Pharyngeal- Pill -- Pharyngeal -- Pharyngeal Comment --  CHL IP CERVICAL ESOPHAGEAL PHASE 05/16/2017 Cervical Esophageal Phase WFL Pudding Teaspoon -- Pudding Cup -- Honey Teaspoon -- Honey Cup -- Nectar Teaspoon -- Nectar Cup -- Nectar Straw -- Thin Teaspoon -- Thin Cup -- Thin Straw -- Puree -- Mechanical Soft -- Regular -- Multi-consistency -- Pill -- Cervical Esophageal Comment -- No flowsheet data found. Herbie [place], [place] CCC-SLP [phone] Lynann Beaver 05/16/2017, 12:14 PM               Assessment/Plan: Diagnosis: 83 yo female with gait disorder, s/p right CEA/re-do with resection of primary anastomosis 1. Does the need for close, 24 hr/day medical supervision in concert with the patient's rehab needs make it unreasonable for this patient to be served in a less intensive setting? No 2. Co-Morbidities requiring supervision/potential complications: left eye enucleation, ckd 3. Due to bladder management, bowel management, safety, skin/wound care and disease management, does the patient require 24 hr/day rehab nursing? N/A 4. Does the patient require coordinated care of a physician, rehab nurse, n/a to address physical and functional deficits in the context of the above medical diagnosis(es)? No Addressing deficits in the following areas: balance, endurance,  bathing, feeding and toileting 5. Can the patient actively participate in an intensive therapy program of at least 3 hrs of therapy per day at least 5 days per week? No 6. The potential for patient to make measurable gains while on inpatient rehab is fair and poor 7. Anticipated functional outcomes upon discharge from inpatient rehab are n/a  with PT, n/a with OT, n/a with SLP. 8. Estimated rehab length of stay to reach the above functional goals is: n/a 9. Anticipated D/C setting: Other 10. Anticipated  post D/C treatments: HH therapy 11. Overall Rehab/Functional Prognosis: fair  RECOMMENDATIONS: This patient's condition is appropriate for continued rehabilitative care in the following setting: SNF Patient has agreed to participate in recommended program. Potentially Note that insurance prior authorization may be required for reimbursement for recommended care.  Comment: While family may want inpatient rehab, I am not sure that she would tolerate or make "meaningful gains" while on rehab. SNF probably most appropriate venue for her. The patient herself told me she did not want to do 3 hours of therapy per day.   Meredith Staggers, MD, [place] [facility] 05/17/2017    Meredith Staggers, MD 05/17/2017

## 2017-05-17 NOTE — Progress Notes (Signed)
Pt able to ambulate with front wheeled walker about 70ft very slowly. HR increased into the 120's. Pt in chair after walk and blood pressure running 60's to 80's. Pt on RA and alert and oriented but states she feels bad. Paged PA and received order for 500cc bolus. Will continue to monitor.

## 2017-05-17 NOTE — Progress Notes (Signed)
[place] for warfarin Indication: atrial fibrillation  Allergies  Allergen Reactions  . No Known Allergies     Patient Measurements: Height: 5\' 2"  (157.5 cm) Weight: 150 lb 5.7 oz (68.2 kg) IBW/kg (Calculated) : 50.1  Vital Signs: Temp: 97.7 F (36.5 C) (07/07 1114) Temp Source: Oral (07/07 1114) BP: 94/58 (07/07 1114) Pulse Rate: 90 (07/07 1114)  Labs:  Recent Labs  05/14/17 1604 05/14/17 2208 05/15/17 0531 05/16/17 0216 05/17/17 0526  HGB  --   --   --   --  7.7*  HCT  --   --   --   --  24.8*  PLT  --   --   --   --  261  LABPROT  --   --   --  13.7 15.7*  INR  --   --   --  1.04 1.24  CKTOTAL 47  --   --   --   --   CKMB 2.7  --   --   --   --   TROPONINI 0.05* 0.05* 0.04*  --   --     Estimated Creatinine Clearance: 32.2 mL/min (A) (by C-G formula based on SCr of 1.07 mg/dL (H)).  Assessment: 83 yo f s/p right carotid endarterectomy  Resumed warfarin 7/5 for Afib INR trending up = 1.24 today  Dose PTA = 5 mg MWF, 2.5 mg AOD  Goal of Therapy:  INR 2-3 Monitor platelets by anticoagulation protocol: Yes   Plan:  Warfarin 5 mg x 1  Daily INR, CBC q72h  Thank you Anette Guarneri, PharmD [phone] 05/17/2017 11:47 AM

## 2017-05-18 ENCOUNTER — Inpatient Hospital Stay (HOSPITAL_COMMUNITY): Payer: Medicare Other

## 2017-05-18 DIAGNOSIS — I6521 Occlusion and stenosis of right carotid artery: Principal | ICD-10-CM

## 2017-05-18 LAB — URINALYSIS, ROUTINE W REFLEX MICROSCOPIC
BILIRUBIN URINE: NEGATIVE
Bacteria, UA: NONE SEEN
GLUCOSE, UA: NEGATIVE mg/dL
HGB URINE DIPSTICK: NEGATIVE
Ketones, ur: 5 mg/dL — AB
LEUKOCYTES UA: NEGATIVE
NITRITE: NEGATIVE
PH: 5 (ref 5.0–8.0)
Protein, ur: 30 mg/dL — AB
SPECIFIC GRAVITY, URINE: 1.023 (ref 1.005–1.030)
SQUAMOUS EPITHELIAL / LPF: NONE SEEN

## 2017-05-18 LAB — CBC
HEMATOCRIT: 24 % — AB (ref 36.0–46.0)
HEMATOCRIT: 33.6 % — AB (ref 36.0–46.0)
HEMOGLOBIN: 10.9 g/dL — AB (ref 12.0–15.0)
Hemoglobin: 7.5 g/dL — ABNORMAL LOW (ref 12.0–15.0)
MCH: 29.4 pg (ref 26.0–34.0)
MCH: 28.9 pg (ref 26.0–34.0)
MCHC: 31.3 g/dL (ref 30.0–36.0)
MCHC: 32.4 g/dL (ref 30.0–36.0)
MCV: 94.1 fL (ref 78.0–100.0)
MCV: 89.1 fL (ref 78.0–100.0)
Platelets: 275 10*3/uL (ref 150–400)
Platelets: 275 10*3/uL (ref 150–400)
RBC: 2.55 MIL/uL — AB (ref 3.87–5.11)
RBC: 3.77 MIL/uL — AB (ref 3.87–5.11)
RDW: 14 % (ref 11.5–15.5)
RDW: 16.8 % — AB (ref 11.5–15.5)
WBC: 9.8 10*3/uL (ref 4.0–10.5)
WBC: 12.8 10*3/uL — AB (ref 4.0–10.5)

## 2017-05-18 LAB — PREPARE RBC (CROSSMATCH)

## 2017-05-18 LAB — GLUCOSE, CAPILLARY
GLUCOSE-CAPILLARY: 187 mg/dL — AB (ref 65–99)
GLUCOSE-CAPILLARY: 164 mg/dL — AB (ref 65–99)
Glucose-Capillary: 153 mg/dL — ABNORMAL HIGH (ref 65–99)
Glucose-Capillary: 138 mg/dL — ABNORMAL HIGH (ref 65–99)

## 2017-05-18 LAB — PROTIME-INR
INR: 1.38
Prothrombin Time: 17.1 seconds — ABNORMAL HIGH (ref 11.4–15.2)

## 2017-05-18 LAB — LACTIC ACID, PLASMA: LACTIC ACID, VENOUS: 1.5 mmol/L (ref 0.5–1.9)

## 2017-05-18 LAB — TROPONIN I: TROPONIN I: 8.8 ng/mL — AB (ref <0.03)

## 2017-05-18 MED ORDER — ORAL CARE MOUTH RINSE
15.0000 mL | Freq: Two times a day (BID) | OROMUCOSAL | Status: DC
  Administered 2017-05-19 – 2017-05-26 (×13): 15 mL via OROMUCOSAL

## 2017-05-18 MED ORDER — MORPHINE SULFATE (PF) 4 MG/ML IV SOLN: 1.0000 mg | INTRAVENOUS | Status: DC | PRN

## 2017-05-18 MED ORDER — AMIODARONE IV BOLUS ONLY 150 MG/100ML
150.0000 mg | Freq: Once | INTRAVENOUS | Status: AC
  Administered 2017-05-18: 150 mg via INTRAVENOUS
  Filled 2017-05-18: qty 100

## 2017-05-18 MED ORDER — SODIUM CHLORIDE 0.9 % IV SOLN
Freq: Once | INTRAVENOUS | Status: AC
  Administered 2017-05-18: 10 mL/h via INTRAVENOUS

## 2017-05-18 MED ORDER — MORPHINE SULFATE (PF) 4 MG/ML IV SOLN
1.0000 mg | INTRAVENOUS | Status: DC | PRN
Start: 2017-05-18 — End: 2017-05-26

## 2017-05-18 MED ORDER — HEPARIN (PORCINE) IN NACL 100-0.45 UNIT/ML-% IJ SOLN
900.0000 [IU]/h | INTRAMUSCULAR | Status: DC
  Administered 2017-05-18: 900 [IU]/h via INTRAVENOUS
  Filled 2017-05-18: qty 250

## 2017-05-18 MED ORDER — AMIODARONE HCL IN DEXTROSE 360-4.14 MG/200ML-% IV SOLN
INTRAVENOUS | Status: AC
  Filled 2017-05-18: qty 200

## 2017-05-18 MED ORDER — WARFARIN SODIUM 5 MG PO TABS
5.0000 mg | ORAL_TABLET | Freq: Once | ORAL | Status: AC
  Administered 2017-05-18: 5 mg via ORAL
  Filled 2017-05-18: qty 1

## 2017-05-18 MED ORDER — SODIUM CHLORIDE 0.9 % IV BOLUS (SEPSIS)
500.0000 mL | Freq: Once | INTRAVENOUS | Status: AC
  Administered 2017-05-18: 500 mL via INTRAVENOUS

## 2017-05-18 NOTE — Significant Event (Signed)
Rapid Response Event Note  Overview: Time Called: 1223 Arrival Time: 1228 Event Type: Hypotension  Initial Focused Assessment: Patient sitting in chair, alert but very weak. BP 74/40  AF 110  RR 24  O2 sat 100% on [place] Has received 1 u PRBC Family at bedside  Interventions: Assisted back to bed 500cc NS bolus 2nd u PRBC infused  Assisted with transfer to [place] (if not transferred):  Event Summary: Name of Physician Notified: Field's prior to my arrival at  (PTA RRT)  Name of Consulting Physician Notified: Dr. Paschal Dopp at 2015  Outcome: Transferred (Comment)  Event End Time: [place]  Raliegh Ip

## 2017-05-18 NOTE — Progress Notes (Addendum)
[facility]  Subjective  - Feels weak.   Objective (!) 102/54 (!) 109 97.9 F (36.6 C) (Oral) 16 100%  Intake/Output Summary (Last 24 hours) at 05/18/17 0801 Last data filed at 05/18/17 0600  Gross per 24 hour  Intake             1800 ml  Output              500 ml  Net             1300 ml   Right neck incision healing well No neurologic deficit  Grip 5/5, left arm edema patient states no pain.  Compartments soft Heart A fib irregular rhythm  Abdomin with Hypo BS, soft non tender to palpation   Assessment/Planning: POD # 5 femaleis s/p: right carotid endarterectomy 05/13/17 with subsequent occlusion of right internal carotid artery and left facial droop in PACU, redo carotid endarterectomy and resection of primary anastomosis   Swallow study  SLP Diet Recommendations: Regular solids;Thin liquid  HGB 7.5 this am.  Will transfuse 2 units PRBC today  BP hypotensive given 500 cc bolus NS yesterday and BP medication decreased per cardiology.  HR elevated 109-140.  O2 SAT 100 on 2 L [place].  Encourage IS.  Pending home verses SNF once stabilized.   BM yesterday, still complains of abdominal discomfort.  Dulcolax PRN  [facility] 05/18/2017 8:01 AM -- Agree with transfusion Still feels weak.  Still complains of chest pain. Social dispo still pending  Ruta Hinds, MD [facility] [place]: [phone] Pager: [phone]  Laboratory Lab Results:  Recent Labs  05/17/17 0526 05/18/17 0151  WBC 7.9 9.8  HGB 7.7* 7.5*  HCT 24.8* 24.0*  PLT 261 275   BMET No results for input(s): NA, K, CL, CO2, GLUCOSE, BUN, CREATININE, CALCIUM in the last 72 hours.  COAG Lab Results  Component Value Date   INR 1.38 05/18/2017   INR 1.24 05/17/2017   INR 1.04 05/16/2017   No results found for: PTT

## 2017-05-18 NOTE — Progress Notes (Signed)
PA aware of swelling in pt's left arm

## 2017-05-18 NOTE — Progress Notes (Signed)
Progress Note  Patient Name: Kylie Sanchez Date of Encounter: 05/18/2017  Primary Cardiologist: Allred  Subjective   Feeling tired. Continued chest pain and back pain. Plan for blood products to be given today. Device interrogated shows sinus tachycardia.  Inpatient Medications    Scheduled Meds: . aspirin EC  325 mg Oral Daily  . docusate sodium  100 mg Oral Daily  . glimepiride  1 mg Oral Q breakfast  . insulin aspart  0-9 Units Subcutaneous TID WC  . lisinopril  5 mg Oral Daily  . metFORMIN  500 mg Oral Q breakfast  . metoprolol tartrate  50 mg Oral Q6H  . pantoprazole  40 mg Oral Daily  . [place]   Oral Once  . Warfarin - Pharmacist Dosing Inpatient   Does not apply q1800   Continuous Infusions: . sodium chloride    . sodium chloride 50 mL/hr at 05/18/17 0800  . sodium chloride    . magnesium sulfate 1 - 4 g bolus IVPB     PRN Meds: sodium chloride, acetaminophen **OR** acetaminophen, alum & mag hydroxide-simeth, bisacodyl, guaiFENesin-dextromethorphan, hydrALAZINE, labetalol, magnesium sulfate 1 - 4 g bolus IVPB, metoprolol tartrate, morphine injection, ondansetron, oxyCODONE-acetaminophen, phenol, potassium chloride   Vital Signs    Vitals:   05/17/17 2016 05/17/17 2330 05/18/17 0327 05/18/17 0800  BP: (!) 84/47 (!) 88/54 (!) 102/54 (!) 79/47  Pulse: 97 93 (!) 109 (!) 102  Resp: 16 18 16  (!) 21  Temp: 97.9 F (36.6 C) 97.7 F (36.5 C) 97.9 F (36.6 C) 97.7 F (36.5 C)  TempSrc: Oral Oral Oral Oral  SpO2: 96% 98% 100% 96%  Weight:      Height:        Intake/Output Summary (Last 24 hours) at 05/18/17 0916 Last data filed at 05/18/17 0800  Gross per 24 hour  Intake             1900 ml  Output              500 ml  Net             1400 ml   Filed Weights   05/13/17 1115 05/14/17 0232  Weight: 140 lb (63.5 kg) 150 lb 5.7 oz (68.2 kg)    Telemetry    Sinus tachycardia - Personally Reviewed  ECG    None recent - Personally  Reviewed  Physical Exam   GEN: Well nourished, well developed, in no acute distress  HEENT: normal  Neck: no JVD, carotid bruits, or masses Cardiac: tachycardic, regular; no murmurs, rubs, or gallops,no edema  Respiratory:  clear to auscultation bilaterally, normal work of breathing GI: soft, nontender, nondistended, + BS MS: no deformity or atrophy  Skin: warm and dry Neuro:  Strength and sensation are intact Psych: euthymic mood, full affect    Labs    Chemistry  Recent Labs Lab 05/13/17 1136 05/14/17 0346  NA 139 137  K 5.1 5.1  CL  --  110  CO2  --  20*  GLUCOSE 119* 175*  BUN  --  22*  CREATININE  --  1.07*  CALCIUM  --  8.3*  GFRNONAA  --  45*  GFRAA  --  52*  ANIONGAP  --  7     Hematology  Recent Labs Lab 05/14/17 0346 05/17/17 0526 05/18/17 0151  WBC 9.2 7.9 9.8  RBC 2.86* 2.64* 2.55*  HGB 8.5* 7.7* 7.5*  HCT 26.2* 24.8* 24.0*  MCV 91.6 93.9  94.1  MCH 29.7 29.2 29.4  MCHC 32.4 31.0 31.3  RDW 12.8 13.9 14.0  PLT 221 261 275    Cardiac Enzymes  Recent Labs Lab 05/14/17 1604 05/14/17 2208 05/15/17 0531  TROPONINI 0.05* 0.05* 0.04*   No results for input(s): TROPIPOC in the last 168 hours.   BNPNo results for input(s): BNP, PROBNP in the last 168 hours.   DDimer No results for input(s): DDIMER in the last 168 hours.   Radiology    Dg Swallowing Func-speech Pathology  Result Date: 05/16/2017 Objective Swallowing Evaluation: Type of Study: MBS-Modified Barium Swallow Study Patient Details Name: Kylie Sanchez MRN: [id number] Date of Birth: [birth year] Today's Date: 05/16/2017 Time: SLP Start Time (ACUTE ONLY): 1057-SLP Stop Time (ACUTE ONLY): 1117 SLP Time Calculation (min) (ACUTE ONLY): 20 min Past Medical History: Past Medical History: Diagnosis Date . Cancer ([place]) 2015  left eye . Cataract  . CKD (chronic kidney disease) stage 3, GFR 30-59 ml/min   due to DM . Dizziness  . Hyperlipidemia  . Hypertension  . Hypertensive cardiovascular disease  .  Impaired vision  . Paroxysmal atrial fibrillation (HCC)  . Presence of permanent cardiac pacemaker  . Retinopathy  . Sick sinus syndrome ([place])  . Type II or unspecified type diabetes mellitus without mention of complication, not stated as uncontrolled  Past Surgical History: Past Surgical History: Procedure Laterality Date . DENTAL SURGERY   . ENDARTERECTOMY Right 05/13/2017  Procedure: REDO ENDARTERECTOMY CAROTID Resection Redundant Internal Corotid Artery.;  Surgeon: Angelia Mould, MD;  Location: [facility];  Service: Vascular;  Laterality: Right; . ENDARTERECTOMY Right 05/13/2017  Procedure: ENDARTERECTOMY CAROTID-RIGHT;  Surgeon: Serafina Mitchell, MD;  Location: [facility] OR;  Service: Vascular;  Laterality: Right; . EYE SURGERY Left   tumor and eye removed . PACEMAKER INSERTION  04/20/12  MDT Adapta L implanted by Dr Rayann Heman . PATCH ANGIOPLASTY Right 05/13/2017  Procedure: PATCH ANGIOPLASTY USING Rueben Bash BIOLOGIC PATCH;  Surgeon: Serafina Mitchell, MD;  Location: [facility] OR;  Service: Vascular;  Laterality: Right; . PERMANENT PACEMAKER INSERTION N/A 04/20/2012  Procedure: PERMANENT PACEMAKER INSERTION;  Surgeon: Thompson Grayer, MD;  Location: [facility] CATH LAB;  Service: Cardiovascular;  Laterality: N/A; HPI: 83 y.o. female with hx of DM, SSS, afib admitted for scheduled right CEA 7/3.  Post-operatively, pt developed left facial asymmetry.  CT showed abrupt occlusion ICA at distal end of patch; pt taken back to OR for resection with primary anastomosis.  Pt c/o difficulty swallowing.  Subjective: alert, irritable Assessment / Plan / Recommendation CHL IP CLINICAL IMPRESSIONS 05/16/2017 Clinical Impression Pt demonstrates no aspiration or penetration, Occasional throat clearing still observed during exam. Esophageal sweep revealed stasis, apperance of dysmotilty though no radiologist present to confirm. Encouraged pt to follow esophageal precautions with regular diet, thin liquids, particularly following solids and pills with sufficient  liquids. No SLP f/u needed acutely.  SLP Visit Diagnosis Dysphagia, unspecified (R13.10) Attention and concentration deficit following -- Frontal lobe and executive function deficit following -- Impact on safety and function Mild aspiration risk   CHL IP TREATMENT RECOMMENDATION 05/16/2017 Treatment Recommendations No treatment recommended at this time   Prognosis 05/14/2017 Prognosis for Safe Diet Advancement Good Barriers to Reach Goals -- Barriers/Prognosis Comment -- CHL IP DIET RECOMMENDATION 05/16/2017 SLP Diet Recommendations Regular solids;Thin liquid Liquid Administration via Cup;Straw Medication Administration Whole meds with puree Compensations Slow rate;Small sips/bites;Follow solids with liquid Postural Changes Seated upright at 90 degrees;Remain semi-upright after after feeds/meals (Comment)   CHL IP OTHER RECOMMENDATIONS 05/16/2017  Recommended Consults -- Oral Care Recommendations Oral care BID Other Recommendations --   CHL IP FOLLOW UP RECOMMENDATIONS 05/16/2017 Follow up Recommendations Skilled Nursing facility   [facility] IP FREQUENCY AND DURATION 05/14/2017 Speech Therapy Frequency (ACUTE ONLY) min 2x/week Treatment Duration 1 week      CHL IP ORAL PHASE 05/16/2017 Oral Phase WFL Oral - Pudding Teaspoon -- Oral - Pudding Cup -- Oral - Honey Teaspoon -- Oral - Honey Cup -- Oral - Nectar Teaspoon -- Oral - Nectar Cup -- Oral - Nectar Straw -- Oral - Thin Teaspoon -- Oral - Thin Cup -- Oral - Thin Straw -- Oral - Puree -- Oral - Mech Soft -- Oral - Regular -- Oral - Multi-Consistency -- Oral - Pill -- Oral Phase - Comment --  CHL IP PHARYNGEAL PHASE 05/16/2017 Pharyngeal Phase WFL Pharyngeal- Pudding Teaspoon -- Pharyngeal -- Pharyngeal- Pudding Cup -- Pharyngeal -- Pharyngeal- Honey Teaspoon -- Pharyngeal -- Pharyngeal- Honey Cup -- Pharyngeal -- Pharyngeal- Nectar Teaspoon -- Pharyngeal -- Pharyngeal- Nectar Cup -- Pharyngeal -- Pharyngeal- Nectar Straw -- Pharyngeal -- Pharyngeal- Thin Teaspoon -- Pharyngeal --  Pharyngeal- Thin Cup -- Pharyngeal -- Pharyngeal- Thin Straw -- Pharyngeal -- Pharyngeal- Puree -- Pharyngeal -- Pharyngeal- Mechanical Soft -- Pharyngeal -- Pharyngeal- Regular -- Pharyngeal -- Pharyngeal- Multi-consistency -- Pharyngeal -- Pharyngeal- Pill -- Pharyngeal -- Pharyngeal Comment --  CHL IP CERVICAL ESOPHAGEAL PHASE 05/16/2017 Cervical Esophageal Phase WFL Pudding Teaspoon -- Pudding Cup -- Honey Teaspoon -- Honey Cup -- Nectar Teaspoon -- Nectar Cup -- Nectar Straw -- Thin Teaspoon -- Thin Cup -- Thin Straw -- Puree -- Mechanical Soft -- Regular -- Multi-consistency -- Pill -- Cervical Esophageal Comment -- No flowsheet data found. Herbie [place], [place] CCC-SLP [phone] Lynann Beaver 05/16/2017, 12:14 PM               Cardiac Studies   TTE - Left ventricle: The cavity size was normal. Wall thickness was   increased in a pattern of severe LVH. Systolic function was   normal. The estimated ejection fraction was in the range of 55%   to 60%. Wall motion was normal; there were no regional wall   motion abnormalities. Doppler parameters are consistent with   abnormal left ventricular relaxation (grade 1 diastolic   dysfunction). - Aortic valve: Small mobile calcium deposit seen on PSL below   aortic valve near base of anterior leaflet. - Mitral valve: Calcified annulus. Mildly thickened leaflets . - Atrial septum: No defect or patent foramen ovale was identified. - Pulmonary arteries: PA peak pressure: 32 mm Hg (S).  Patient Profile     83 y.o. female hx of PAF on warfarin, symptomatic bradycardia with PPM ([employer]), diabetes type 2, hypertension, hyperlipidemia who is being seen today for the evaluation of chest pain and elevated troponins at the request of Dr. Trula Slade. Pt is S/P right carotid endarterectomy on 05/13/17 and redo on 05/14/17.   Assessment & Plan    1. Chest pain: unclear cause. Troponin was elevated previously but unclear if due to recent surgery. Deagan Sevin recheck  and if elevated, start heparin. 2. Atrial fib, paroxysmal: On coumadin. Device interrogation shows sinus tachycardia today. No changes in medications 3. NSVT: None over the last 24 hours 4. Hypotension: Planning on 2U PRBC which should increase her BP and may help with chest pain.  Signed, Lynesha Bango Meredith Leeds, MD  05/18/2017, 9:16 AM

## 2017-05-18 NOTE — Progress Notes (Signed)
[place] for warfarin Indication: atrial fibrillation  Allergies  Allergen Reactions  . No Known Allergies     Patient Measurements: Height: 5\' 2"  (157.5 cm) Weight: 150 lb 5.7 oz (68.2 kg) IBW/kg (Calculated) : 50.1  Vital Signs: Temp: 97.5 F (36.4 C) (07/08 1032) Temp Source: Oral (07/08 1032) BP: 75/49 (07/08 1032) Pulse Rate: 113 (07/08 1032)  Labs:  Recent Labs  05/16/17 0216 05/17/17 0526 05/18/17 0151  HGB  --  7.7* 7.5*  HCT  --  24.8* 24.0*  PLT  --  261 275  LABPROT 13.7 15.7* 17.1*  INR 1.04 1.24 1.38    Estimated Creatinine Clearance: 32.2 mL/min (A) (by C-G formula based on SCr of 1.07 mg/dL (H)).  Assessment: 83 yo f s/p right carotid endarterectomy  Resumed warfarin 7/5 for Afib INR trending up = 1.38 today  Dose PTA = 5 mg MWF, 2.5 mg AOD  Goal of Therapy:  INR 2-3 Monitor platelets by anticoagulation protocol: Yes   Plan:  Warfarin 5 mg x 1  Daily INR, CBC q72h  Thank you Anette Guarneri, PharmD [phone] 05/18/2017 11:23 AM

## 2017-05-18 NOTE — Progress Notes (Signed)
First unit of blood complete, pt's blood pressure still low. Dr Oneida Alar notified and transfer to ICU ordered. Rapid response notified

## 2017-05-18 NOTE — Progress Notes (Signed)
Called pt's nephew to update him on his aunt's progress. I informed him that pt was going to get 2UPRBC.

## 2017-05-18 NOTE — Progress Notes (Addendum)
Pt with hypotension this afternoon as low as 16L systolic Pt mentating around baseline with some mild confusion Arm pressure same both arms Still complains of some chest pain but better Afebrile No obvious source of ongoing bleeding.  Anemia has been stable several days No abdominal pain   Pt transferred to ICU Will stop metoprolol Continue to transfuse also got 500 ml bolus saline Will check UA/culture blood culture lactate to rule out septic source Per cardiology low likeliehood this is primary myocardial event Recheck troponin Morphine dose reduce percocet stopped Tylenol only for pain Check chest xray to rule out pneumonia  Heparin started in addition to warfarin per cardiology only if troponin rising  Kylie Hinds, MD [facility]: [phone] Pager: [phone]

## 2017-05-18 NOTE — Progress Notes (Signed)
CRITICAL VALUE ALERT  Critical Value: Troponin 8.8   Date & Time Notied:  05/18/2017 2000  Provider Notified: Qu[place]/cardiology  Orders Received/Actions taken: Heparin ordered

## 2017-05-18 NOTE — Progress Notes (Signed)
[place] for warfarin --> Heparin Indication: atrial fibrillation  Allergies  Allergen Reactions  . No Known Allergies     Patient Measurements: Height: 5\' 2"  (157.5 cm) Weight: 150 lb 5.7 oz (68.2 kg) IBW/kg (Calculated) : 50.1  Vital Signs: Temp: 97.8 F (36.6 C) (07/08 1634) Temp Source: Oral (07/08 1634) BP: 98/58 (07/08 1900) Pulse Rate: 125 (07/08 1900)  Labs:  Recent Labs  05/16/17 0216  05/17/17 0526 05/18/17 0151 05/18/17 1837  HGB  --   < > 7.7* 7.5* 10.9*  HCT  --   --  24.8* 24.0* 33.6*  PLT  --   --  261 275 275  LABPROT 13.7  --  15.7* 17.1*  --   INR 1.04  --  1.24 1.38  --   TROPONINI  --   --   --   --  8.80*  < > = values in this interval not displayed.  Estimated Creatinine Clearance: 32.2 mL/min (A) (by C-G formula based on SCr of 1.07 mg/dL (H)).  Assessment: 83 yo f s/p right carotid endarterectomy  Warfarin now on hold, transitioning to heparin for now INR trending up = 1.38 today  Dose PTA = 5 mg MWF, 2.5 mg AOD  Goal of Therapy:  INR 2-3 Monitor platelets by anticoagulation protocol: Yes  Heparin level = 0.3 to 0.7   Plan:  Heparin at 900 units / hr (no bolus) Heparin level, CBC daily  Thank you Anette Guarneri, PharmD [phone] 05/18/2017 9:00 PM

## 2017-05-19 ENCOUNTER — Other Ambulatory Visit (HOSPITAL_COMMUNITY): Payer: Medicare Other

## 2017-05-19 ENCOUNTER — Encounter (HOSPITAL_COMMUNITY): Payer: Self-pay | Admitting: Cardiology

## 2017-05-19 DIAGNOSIS — I9589 Other hypotension: Secondary | ICD-10-CM

## 2017-05-19 DIAGNOSIS — R58 Hemorrhage, not elsewhere classified: Secondary | ICD-10-CM | POA: Diagnosis not present

## 2017-05-19 DIAGNOSIS — R7989 Other specified abnormal findings of blood chemistry: Secondary | ICD-10-CM

## 2017-05-19 DIAGNOSIS — R778 Other specified abnormalities of plasma proteins: Secondary | ICD-10-CM | POA: Diagnosis present

## 2017-05-19 DIAGNOSIS — Z7901 Long term (current) use of anticoagulants: Secondary | ICD-10-CM

## 2017-05-19 DIAGNOSIS — I481 Persistent atrial fibrillation: Secondary | ICD-10-CM

## 2017-05-19 DIAGNOSIS — E784 Other hyperlipidemia: Secondary | ICD-10-CM

## 2017-05-19 LAB — BPAM RBC
BLOOD PRODUCT EXPIRATION DATE: 201807172359
Blood Product Expiration Date: 201807172359
ISSUE DATE / TIME: 201807081002
ISSUE DATE / TIME: 201807081230
Unit Type and Rh: 5100
Unit Type and Rh: 5100

## 2017-05-19 LAB — BASIC METABOLIC PANEL
Anion gap: 6 (ref 5–15)
BUN: 28 mg/dL — AB (ref 6–20)
CHLORIDE: 118 mmol/L — AB (ref 101–111)
CO2: 15 mmol/L — ABNORMAL LOW (ref 22–32)
CREATININE: 1.32 mg/dL — AB (ref 0.44–1.00)
Calcium: 7 mg/dL — ABNORMAL LOW (ref 8.9–10.3)
GFR calc Af Amer: 40 mL/min — ABNORMAL LOW (ref >60)
GFR calc non Af Amer: 35 mL/min — ABNORMAL LOW (ref >60)
GLUCOSE: 114 mg/dL — AB (ref 65–99)
POTASSIUM: 3.9 mmol/L (ref 3.5–5.1)
Sodium: 139 mmol/L (ref 135–145)

## 2017-05-19 LAB — TYPE AND SCREEN
ABO/RH(D): O POS
Antibody Screen: NEGATIVE
UNIT DIVISION: 0
Unit division: 0

## 2017-05-19 LAB — TROPONIN I
TROPONIN I: 6.37 ng/mL — AB (ref <0.03)
Troponin I: 6.85 ng/mL (ref <0.03)
Troponin I: 7.75 ng/mL (ref <0.03)

## 2017-05-19 LAB — GLUCOSE, CAPILLARY
GLUCOSE-CAPILLARY: 115 mg/dL — AB (ref 65–99)
GLUCOSE-CAPILLARY: 173 mg/dL — AB (ref 65–99)
GLUCOSE-CAPILLARY: 106 mg/dL — AB (ref 65–99)
Glucose-Capillary: 128 mg/dL — ABNORMAL HIGH (ref 65–99)

## 2017-05-19 LAB — PROTIME-INR
INR: 3
PROTHROMBIN TIME: 31.8 s — AB (ref 11.4–15.2)

## 2017-05-19 LAB — CBC
HEMATOCRIT: 34.1 % — AB (ref 36.0–46.0)
Hemoglobin: 11.2 g/dL — ABNORMAL LOW (ref 12.0–15.0)
MCH: 29 pg (ref 26.0–34.0)
MCHC: 32.8 g/dL (ref 30.0–36.0)
MCV: 88.3 fL (ref 78.0–100.0)
PLATELETS: 256 10*3/uL (ref 150–400)
RBC: 3.86 MIL/uL — AB (ref 3.87–5.11)
RDW: 17.2 % — ABNORMAL HIGH (ref 11.5–15.5)
WBC: 11 10*3/uL — ABNORMAL HIGH (ref 4.0–10.5)

## 2017-05-19 LAB — URINE CULTURE: Culture: NO GROWTH

## 2017-05-19 LAB — HEPARIN LEVEL (UNFRACTIONATED): HEPARIN UNFRACTIONATED: 0.24 [IU]/mL — AB (ref 0.30–0.70)

## 2017-05-19 MED ORDER — WARFARIN - PHARMACIST DOSING INPATIENT: Freq: Every day | Status: DC

## 2017-05-19 MED ORDER — ATORVASTATIN CALCIUM 40 MG PO TABS
40.0000 mg | ORAL_TABLET | Freq: Every day | ORAL | Status: DC
  Administered 2017-05-19 – 2017-05-25 (×7): 40 mg via ORAL
  Filled 2017-05-19 (×7): qty 1

## 2017-05-19 MED ORDER — METOPROLOL TARTRATE 12.5 MG HALF TABLET: 12.5000 mg | ORAL_TABLET | Freq: Two times a day (BID) | ORAL | Status: DC

## 2017-05-19 MED ORDER — METOPROLOL TARTRATE 25 MG PO TABS
25.0000 mg | ORAL_TABLET | Freq: Two times a day (BID) | ORAL | Status: DC
Start: 2017-05-19 — End: 2017-05-20
  Administered 2017-05-19 – 2017-05-20 (×3): 25 mg via ORAL
  Filled 2017-05-19 (×3): qty 1

## 2017-05-19 NOTE — Consult Note (Signed)
   [facility] CM Inpatient Consult   05/19/2017  Kylie Sanchez 1925/10/16 [id number]  Chart reviewed for re-admission.  Patient is an 83 year old female with elevated troponins and s/p right carotid endarterectomy.  Review for disposition is for inpatient rehab.  Patient in the Medicare ACO. Will follow for disposition changes and needs.  For questions, please contact:  Natividad Brood, RN BSN [facility] Liaison  [phone] business mobile phone Toll free office [phone]

## 2017-05-19 NOTE — Progress Notes (Signed)
Inpatient Rehabilitation  Please see consult from Rehab MD for full details.  I await therapy notes from today to better determine patient's ability to tolerate IP Rehab after blood transfusions.  Plan to follow up with patient later today.  Please call with questions.   Carmelia Roller., CCC/SLP Admission Coordinator  [facility]  Cell [phone]

## 2017-05-19 NOTE — Progress Notes (Signed)
Progress Note  Patient Name: Kylie Sanchez Date of Encounter: 05/19/2017  Primary Cardiologist: Allred  Patient Profile     83 y.o. female hx of PAF on warfarin, symptomatic bradycardia with PPM ([employer]), diabetes type 2, hypertension, hyperlipidemia who is being seen today for the evaluation of chest pain and elevated troponinsat the request of Dr. Trula Slade. Pt is S/P right carotid endarterectomy on 05/13/17 and redo on 05/14/17.  - Minimal Troponin elevation on initial evaluation thought to be low risk for Cardiac Event.  Events 7/8: Transferred to CCU yesterday PM due to Hypotension.  Still noted CP, but better Transfused PRBC - with appropriate bump in Hgb. Troponin 8.8 on recheck - per note, IV Heparin ordered (stopped overnight 2/2 INR~3, although not ordered for this) -> no other orders. Afib rates up to 150s yesterday - IV Amiodarone bolus given (thought to be due to BB being held for hypotension.  Principal Problem:   Asymptomatic stenosis of right carotid artery Active Problems:   Long term current use of anticoagulant therapy   Persistent atrial fibrillation (HCC)   Elevated troponin- unclear etiology   Hypotension due to blood loss   Hyperlipidemia   Subjective   No further CP. NO SOB Seems frutstrated  Inpatient Medications    Scheduled Meds: . aspirin EC  325 mg Oral Daily  . docusate sodium  100 mg Oral Daily  . glimepiride  1 mg Oral Q breakfast  . insulin aspart  0-9 Units Subcutaneous TID WC  . mouth rinse  15 mL Mouth Rinse BID  . metFORMIN  500 mg Oral Q breakfast  . metoprolol tartrate  25 mg Oral BID  . pantoprazole  40 mg Oral Daily  . [place]   Oral Once   Continuous Infusions: . sodium chloride    . sodium chloride 50 mL/hr at 05/19/17 0600  . magnesium sulfate 1 - 4 g bolus IVPB     PRN Meds: sodium chloride, acetaminophen **OR** acetaminophen, alum & mag hydroxide-simeth, bisacodyl, guaiFENesin-dextromethorphan, hydrALAZINE,  labetalol, magnesium sulfate 1 - 4 g bolus IVPB, metoprolol tartrate, morphine injection, ondansetron, phenol, potassium chloride   Vital Signs    Vitals:   05/19/17 0500 05/19/17 0600 05/19/17 0700 05/19/17 0757  BP: (!) 90/44 125/65 (!) 125/58   Pulse: (!) 104 (!) 119 (!) 112   Resp: (!) 21 (!) 21 (!) 25   Temp:    97.7 F (36.5 C)  TempSrc:    Oral  SpO2: 95% 95% 97%   Weight:      Height:        Intake/Output Summary (Last 24 hours) at 05/19/17 1040 Last data filed at 05/19/17 0600  Gross per 24 hour  Intake          1719.17 ml  Output              245 ml  Net          1474.17 ml   Filed Weights   05/13/17 1115 05/14/17 0232 05/19/17 0300  Weight: 140 lb (63.5 kg) 150 lb 5.7 oz (68.2 kg) 156 lb 8.4 oz (71 kg)    Telemetry    Afib in 110s - Personally Reviewed  ECG    Afib 116, ? Anteroseptal Qs as well as inferior Qs - Anterolateral ST depressions less prominent.,   - Personally Reviewed (suspect lead placement is off since the strips are quite different re Qs.  However, the ST depressions as notably better.  Physical  Exam   Physical Exam  Constitutional: She is oriented to person, place, and time. She appears well-nourished. No distress.  HENT:  Mouth/Throat: Oropharynx is clear and moist.  Eyes: Left eye exhibits no discharge.  L Eye bandaid in place - s/p enucleation. R eye EOM, PRRL  Neck: Normal range of motion. Neck supple.  R CEA scar healing well   Cardiovascular: Normal heart sounds and normal pulses.  An irregularly irregular rhythm present. Tachycardia present.   Pulmonary/Chest: Effort normal and breath sounds normal. No respiratory distress. She has no wheezes. She has no rales.  Abdominal: Soft. Bowel sounds are normal. She exhibits no distension. There is no tenderness. There is no rebound and no guarding.  Musculoskeletal: Normal range of motion.  L UE 2+ swelling from elbow distally BLE - 1+ edema  Neurological: She is alert and oriented to  person, place, and time.  Skin: Skin is warm and dry. No erythema.  Psychiatric:  Flat mood & affect   Labs    Chemistry  Recent Labs Lab 05/13/17 1136 05/14/17 0346  NA 139 137  K 5.1 5.1  CL  --  110  CO2  --  20*  GLUCOSE 119* 175*  BUN  --  22*  CREATININE  --  1.07*  CALCIUM  --  8.3*  GFRNONAA  --  45*  GFRAA  --  52*  ANIONGAP  --  7     Hematology  Recent Labs Lab 05/18/17 0151 05/18/17 1837 05/19/17 0209  WBC 9.8 12.8* 11.0*  RBC 2.55* 3.77* 3.86*  HGB 7.5* 10.9* 11.2*  HCT 24.0* 33.6* 34.1*  MCV 94.1 89.1 88.3  MCH 29.4 28.9 29.0  MCHC 31.3 32.4 32.8  RDW 14.0 16.8* 17.2*  PLT 275 275 256    Cardiac Enzymes  Recent Labs Lab 05/14/17 1604 05/14/17 2208 05/15/17 0531 05/18/17 1837  TROPONINI 0.05* 0.05* 0.04* 8.80*   No results for input(s): TROPIPOC in the last 168 hours.   BNPNo results for input(s): BNP, PROBNP in the last 168 hours.   DDimer No results for input(s): DDIMER in the last 168 hours.   Radiology    Dg Chest Port 1 View  Result Date: 05/18/2017 CLINICAL DATA:  Patient with hypertension. EXAM: PORTABLE CHEST 1 VIEW COMPARISON:  Chest radiograph 04/16/2017. FINDINGS: Multi lead pacer apparatus overlies the left hemithorax. Monitoring leads overlie the patient. Stable cardiomegaly. Small bilateral pleural effusions. Underlying heterogeneous pulmonary opacities. No pneumothorax. IMPRESSION: Small bilateral pleural effusions with underlying opacities favored to represent atelectasis. Electronically Signed   By: Lovey Newcomer M.D.   On: 05/18/2017 14:58    Cardiac Studies   NO new studies.   Echo from June 2018: normal LV size, but ? Severe LVH. EF 55-60%. ~ GR 1 DD. Small mobile calcified deposit on AoValve. Otw normal Echo.   Assessment & Plan    Principal Problem:   Asymptomatic stenosis of right carotid artery - s/p redo CEA  Active Problems:   Long term current use of anticoagulant therapy - ON Warfarin for  Afib;  Will hold warfarin today until we figure out what to do with elevated Troponin.    Persistent atrial fibrillation (HCC) - rates are elevated, BB was on hold 2/2 hypotension; got fast yesterday & Amio given  Will restart low dose BB PO (was on 200 mg Toprol @ home) - start 25 mg BID given recent hypotension   Elevated troponin- unclear etiology  ? If this is related to hypotension &  AFib RVR with Demand Ischemia vs. NSTEMI (since she did note CP)  Will check EKG & Echo today to exclude new WMA   Hold Warfarin tonite (just in case we consider Cath)  Restart BB & continue ASA.   Hypotension due to blood loss - BP better today post transfusion.  ARB, CCB & BB held  Gradually restart BB dose     Hyperlipidemia - was not on statin @ home.  With carotid Dz & ? MI, would recommend starting Atorvastatin.  Plan for now would be to assess for new RWMA on Echo & cycle Troponin levels (EKG is different, with less prominent ST depressions, but ? Anterior & Inferior Qs - that do not make sense).  Restart BB (albeit lower dose).  With INR ~3, no IV Heparin, but hold Warfarin tonite. With no active angina - can assess with ambulation. If no recurrent Sx & no new RWMA on Echo, would recommend optimizing med Rx as inpatient & consider OP ST.  She does not seem very interested in invasive cardiac evaluation.   Signed, Glenetta Hew, MD  05/19/2017, 10:40 AM

## 2017-05-19 NOTE — Progress Notes (Addendum)
Progress Note  SUBJECTIVE:    Denies any chest pain since getting blood transfusion yesterday. Still feeling weak. Lack of appetite.   OBJECTIVE:   Vitals:   05/19/17 0600 05/19/17 0700  BP: 125/65 (!) 125/58  Pulse: (!) 119 (!) 112  Resp: (!) 21 (!) 25  Temp:      Intake/Output Summary (Last 24 hours) at 05/19/17 0747 Last data filed at 05/19/17 0600  Gross per 24 hour  Intake          1819.17 ml  Output              245 ml  Net          1574.17 ml   Irregularly irregular rhythm Right neck without hematoma 5/5 grip strength b/l 5/5 lower extremity strength bilaterally.   ASSESSMENT/PLAN:   83 y.o. female is s/p: right carotid endarterectomy complicated by occlusion of right ICA in PACU, redo right carotid endarterectomy and resection of primary anastomosis  6 Days Post-Op   Troponin of 8.8 at 2000 yesterday. Started on heparin per cardiology. No further chest pain since blood transfusion early yesterday afternoon. Appreciate cardiology assistance.   Hypotension: BP and H/H have been stable following transfusion and fluid bolus. Systolic BP 268T this am. Blood cultures and urine culture pending. Atelectasis and small bilateral effusions on CXR. Has been afebrile.   Needs to get out of bed today. Incentive spirometry.    Kylie Sanchez 05/19/2017 7:47 AM -- LABS:   CBC    Component Value Date/Time   WBC 11.0 (H) 05/19/2017 0209   HGB 11.2 (L) 05/19/2017 0209   HCT 34.1 (L) 05/19/2017 0209   PLT 256 05/19/2017 0209    BMET    Component Value Date/Time   NA 137 05/14/2017 0346   K 5.1 05/14/2017 0346   CL 110 05/14/2017 0346   CO2 20 (L) 05/14/2017 0346   GLUCOSE 175 (H) 05/14/2017 0346   BUN 22 (H) 05/14/2017 0346   CREATININE 1.07 (H) 05/14/2017 0346   CREATININE 1.08 (H) 04/22/2017 1536   CALCIUM 8.3 (L) 05/14/2017 0346   GFRNONAA 45 (L) 05/14/2017 0346   GFRNONAA 46 (L) 04/22/2017 1536   GFRAA 52 (L) 05/14/2017 0346   GFRAA 53 (L) 04/22/2017  1536    COAG Lab Results  Component Value Date   INR 3.00 05/19/2017   INR 1.38 05/18/2017   INR 1.24 05/17/2017    ANTIBIOTICS:   Anti-infectives    Start     Dose/Rate Route Frequency Ordered Stop   05/14/17 0400  cefUROXime (ZINACEF) 1.5 g in dextrose 5 % 50 mL IVPB     1.5 g 100 mL/hr over 30 Minutes Intravenous Every 12 hours 05/14/17 0235 05/14/17 1701   05/13/17 1107  dextrose 5 % with cefUROXime (ZINACEF) ADS Med    Comments:  Sanchez, Kylie   : cabinet override      05/13/17 1107 05/13/17 2314   05/13/17 1104  cefUROXime (ZINACEF) 1.5 g in dextrose 5 % 50 mL IVPB     1.5 g 100 mL/hr over 30 Minutes Intravenous 30 min pre-op 05/13/17 1104 05/13/17 1536     Virgina Jock, PA-C [facility] Office: [phone] Pager: [phone] 05/19/2017 7:47 AM  I have interviewed the patient and examined the patient. I agree with the findings by the PA. She is frustrated and anxious to go home.  Neuro intact. Cardiology following. PTx when ok with Cardiology. Then, home with HHPTx vs temp SNF  after that.   Gae Gallop, MD [phone]

## 2017-05-19 NOTE — Progress Notes (Signed)
ANTICOAGULATION CONSULT NOTE - Follow Up Consult  Pharmacy Consult for Heparin (warfarin on hold) Indication: atrial fibrillation and elevated troponin  Allergies  Allergen Reactions  . No Known Allergies    Patient Measurements: Height: 5\' 2"  (157.5 cm) Weight: 150 lb 5.7 oz (68.2 kg) IBW/kg (Calculated) : 50.1 Vital Signs: Temp: 97.6 F (36.4 C) (07/08 2357) Temp Source: Oral (07/08 2357) BP: 99/59 (07/08 2357) Pulse Rate: 108 (07/08 2357)  Labs:  Recent Labs  05/17/17 0526 05/18/17 0151 05/18/17 1837 05/19/17 0209  HGB 7.7* 7.5* 10.9* 11.2*  HCT 24.8* 24.0* 33.6* 34.1*  PLT 261 275 275 256  LABPROT 15.7* 17.1*  --  31.8*  INR 1.24 1.38  --  3.00  HEPARINUNFRC  --   --   --  0.24*  TROPONINI  --   --  8.80*  --    Estimated Creatinine Clearance: 32.2 mL/min (A) (by C-G formula based on SCr of 1.07 mg/dL (H)).  Assessment: POD #6 carotid endarterectomy, was on warfarin for afib, found to have elevated troponin and started on heparin, INR jumped to 3 this AM, discussed with MD, will hold heparin and re-start when INR <2  Goal of Therapy:  Heparin level 0.3-0.7 units/ml Monitor platelets by anticoagulation protocol: Yes   Plan:  -Hold heparin  -Re-start heparin when INR <2  Narda Bonds 05/19/2017,3:23 AM

## 2017-05-19 NOTE — Care Management Note (Signed)
Case Management Note Marvetta Gibbons RN, BSN Unit 2W-Case Manager- [place] coverage [phone]  Patient Details  Name: Kylie Sanchez MRN: [id number] Date of Birth: 1925/02/14  Subjective/Objective:   Pt admitted s/p right carotid endarterectomy complicated by occlusion of right ICA in PACU, redo right carotid endarterectomy and resection of primary anastomosis                 Action/Plan: PTA pt lived at home- recommendations for SNF, CIR also consulted and following- CM received message from [facility] regarding preop referral for [facility]- plan at this time is for rehab CIR vs SNF- CM to follow  Expected Discharge Date:                  Expected Discharge Plan:  IP Rehab Facility  In-House Referral:  Clinical Social Work  Discharge planning Services  CM Consult  Post Acute Care Choice:    Choice offered to:     DME Arranged:    DME Agency:     HH Arranged:    [place] Agency:     Status of Service:  In process, will continue to follow  If discussed at Long Length of Stay Meetings, dates discussed:    Discharge Disposition:   Additional Comments:  Dawayne Patricia, RN 05/19/2017, 10:34 AM

## 2017-05-19 NOTE — Progress Notes (Signed)
Physical Therapy Treatment Patient Details Name: Kylie Sanchez MRN: [id number] DOB: 1925-05-06 Today's Date: 05/19/2017    History of Present Illness 83 yo admitted 83 y.o. female with hx of admitted for scheduled right CEA 7/3.  Post-operatively, pt developed left facial asymmetry.  CT showed abrupt occlusion ICA at distal end of patch; pt taken back to OR for resection with primary anastomosis PMhx: Afib, HTN, HLD, pacemaker, CKd, Left-eye enucleation due to basal cell carcinoma DM, SSS    PT Comments    Patient required max-min A for sit to stand and stand pivot transfers this session. Pt able to work on pre gait activities. Pt with decreased activity tolerance and strength and will continue to benefit from further skilled PT services both in acute setting and after d/c.  Follow Up Recommendations  SNF     Equipment Recommendations   (to be determined at next venue)    Recommendations for Other Services       Precautions / Restrictions Precautions Precautions: Fall Restrictions Weight Bearing Restrictions: No    Mobility  Bed Mobility               General bed mobility comments: up in chair upon arrival  Transfers Overall transfer level: Needs assistance Equipment used: Rolling walker (2 wheeled);None Transfers: Sit to/from [facility] Sit to Stand: Mod assist;Min assist;Max assist Stand pivot transfers: Mod assist;Max assist       General transfer comment: max A from recliner, mod A from BSC, and min A from recliner 3rd trial with pt using moment last trial; mod/max for stand pivot transfer X2; Pt used RW second stand pivot and did much better  Ambulation/Gait             General Gait Details: worked on pre gait activity however pt too fatigued to ambulate after stand pivots X2   Stairs            Wheelchair Mobility    Modified Rankin (Stroke Patients Only)       Balance Overall balance assessment: Needs  assistance Sitting-balance support: Feet supported;No upper extremity supported Sitting balance-Leahy Scale: Fair     Standing balance support: Bilateral upper extremity supported Standing balance-Leahy Scale: Poor                              Cognition Arousal/Alertness: Awake/alert Behavior During Therapy: Flat affect;WFL for tasks assessed/performed Overall Cognitive Status: Within Functional Limits for tasks assessed                                        Exercises      General Comments General comments (skin integrity, edema, etc.): HR up to 130s with transfers      Pertinent Vitals/Pain Pain Assessment: No/denies pain    Home Living                      Prior Function            PT Goals (current goals can now be found in the care plan section) Acute Rehab PT Goals PT Goal Formulation: With patient Time For Goal Achievement: 05/22/17 Potential to Achieve Goals: Fair Progress towards PT goals: Progressing toward goals    Frequency    Min 2X/week      PT Plan Current plan remains appropriate  Co-evaluation              AM-PAC PT "6 Clicks" Daily Activity  Outcome Measure  Difficulty turning over in bed (including adjusting bedclothes, sheets and blankets)?: Total Difficulty moving from lying on back to sitting on the side of the bed? : Total Difficulty sitting down on and standing up from a chair with arms (e.g., wheelchair, bedside commode, etc,.)?: Total Help needed moving to and from a bed to chair (including a wheelchair)?: A Little Help needed walking in hospital room?: A Lot Help needed climbing 3-5 steps with a railing? : Total 6 Click Score: 9    End of Session Equipment Utilized During Treatment: Gait belt Activity Tolerance: Patient tolerated treatment well Patient left: in chair;with call bell/phone within reach;with nursing/sitter in room;with family/visitor present;with chair alarm  set Nurse Communication: Mobility status PT Visit Diagnosis: Unsteadiness on feet (R26.81);Other abnormalities of gait and mobility (R26.89);Difficulty in walking, not elsewhere classified (R26.2);Muscle weakness (generalized) (M62.81)     Time: 8032-1224 PT Time Calculation (min) (ACUTE ONLY): 36 min  Charges:  $Therapeutic Activity: 23-37 mins                    G Codes:       Earney Navy, PTA Pager: [phone]     Darliss Cheney 05/19/2017, 3:23 PM

## 2017-05-19 NOTE — Progress Notes (Addendum)
On assessment today patient had a stage I on her left buttock, family stated it was not present on admission but she stated she acquired it while on [place], Marsh Dolly, RN

## 2017-05-20 ENCOUNTER — Inpatient Hospital Stay (HOSPITAL_COMMUNITY): Payer: Medicare Other

## 2017-05-20 DIAGNOSIS — M7989 Other specified soft tissue disorders: Secondary | ICD-10-CM

## 2017-05-20 DIAGNOSIS — I420 Dilated cardiomyopathy: Secondary | ICD-10-CM | POA: Diagnosis not present

## 2017-05-20 DIAGNOSIS — I340 Nonrheumatic mitral (valve) insufficiency: Secondary | ICD-10-CM

## 2017-05-20 LAB — PROTIME-INR
INR: 2.67
PROTHROMBIN TIME: 29 s — AB (ref 11.4–15.2)

## 2017-05-20 LAB — CBC
HCT: 33.5 % — ABNORMAL LOW (ref 36.0–46.0)
HEMOGLOBIN: 10.8 g/dL — AB (ref 12.0–15.0)
MCH: 28.9 pg (ref 26.0–34.0)
MCHC: 32.2 g/dL (ref 30.0–36.0)
MCV: 89.6 fL (ref 78.0–100.0)
Platelets: 302 10*3/uL (ref 150–400)
RBC: 3.74 MIL/uL — ABNORMAL LOW (ref 3.87–5.11)
RDW: 17 % — AB (ref 11.5–15.5)
WBC: 7.3 10*3/uL (ref 4.0–10.5)

## 2017-05-20 LAB — GLUCOSE, CAPILLARY
GLUCOSE-CAPILLARY: 123 mg/dL — AB (ref 65–99)
GLUCOSE-CAPILLARY: 106 mg/dL — AB (ref 65–99)
GLUCOSE-CAPILLARY: 115 mg/dL — AB (ref 65–99)
GLUCOSE-CAPILLARY: 104 mg/dL — AB (ref 65–99)
Glucose-Capillary: 131 mg/dL — ABNORMAL HIGH (ref 65–99)

## 2017-05-20 LAB — ECHOCARDIOGRAM COMPLETE
HEIGHTINCHES: 62 in
WEIGHTICAEL: 2518.54 [oz_av]

## 2017-05-20 MED ORDER — ASPIRIN EC 81 MG PO TBEC
81.0000 mg | DELAYED_RELEASE_TABLET | Freq: Every day | ORAL | Status: DC
  Administered 2017-05-20 – 2017-05-26 (×7): 81 mg via ORAL
  Filled 2017-05-20 (×7): qty 1

## 2017-05-20 MED ORDER — METOPROLOL TARTRATE 25 MG PO TABS
25.0000 mg | ORAL_TABLET | Freq: Four times a day (QID) | ORAL | Status: DC
  Administered 2017-05-20 – 2017-05-21 (×4): 25 mg via ORAL
  Filled 2017-05-20 (×3): qty 1
  Filled 2017-05-20: qty 2

## 2017-05-20 MED ORDER — WARFARIN SODIUM 2 MG PO TABS
2.0000 mg | ORAL_TABLET | Freq: Once | ORAL | Status: AC
  Administered 2017-05-20: 2 mg via ORAL
  Filled 2017-05-20: qty 1

## 2017-05-20 MED ORDER — WARFARIN - PHARMACIST DOSING INPATIENT
Freq: Every day | Status: DC
  Administered 2017-05-20 – 2017-05-23 (×4)

## 2017-05-20 NOTE — Progress Notes (Signed)
*  Preliminary Results* Left upper extremity venous duplex completed. Left upper extremity is negative for deep vein thrombosis. There is evidence of acute superficial vein thrombosis involving the left cephalic vein.  Preliminary results discussed with Dr. Scot Dock.  05/20/2017 9:19 AM  Maudry Mayhew, BS, RVT, RDCS, RDMS

## 2017-05-20 NOTE — Progress Notes (Signed)
Inpatient Rehabilitation  Continuing to follow at a distance for therapy tolerance.  Note that therapy is now recommending SNF for post acute therapies.  Plan to continue to follow at a distance should medical stability result in increased tolerance and potential to make meaningful gains from and IP Rehab stay.  Please call with questions.   Carmelia Roller., CCC/SLP Admission Coordinator  [facility]  Cell [phone]

## 2017-05-20 NOTE — Progress Notes (Addendum)
11:30am CSW received call from pt dtr- they choose [facility] for rehab when pt is stable  10am CSW provided bed offers to patient and home health aid at bedside- they will pass along offers to niece Sharyn Lull for review  CSW will continue to follow  Jorge Ny, [place] Social Worker [phone]

## 2017-05-20 NOTE — Plan of Care (Signed)
Problem: Activity: Goal: Ability to return to normal activity level will improve Outcome: Progressing Pt progressing with activity tolerance but complains of weakness  Problem: Respiratory: Goal: Ability to achieve and maintain a regular respiratory rate will improve Outcome: Progressing Pt on RA

## 2017-05-20 NOTE — Progress Notes (Signed)
   VASCULAR SURGERY ASSESSMENT & PLAN:   7 Days Post-Op s/p: Right CEA.  Left arm swelling: will check venous duplex. DVT unlikely given Coumadin, but given complexity of her hospitalization, I think we need to know if she has a Left UE DVT.  Cardiology deciding on workup for elevated troponins  Activity and disposition pending cardiac workup.   SUBJECTIVE:   C/O generalized weakness  PHYSICAL EXAM:   Vitals:   05/20/17 0400 05/20/17 0500 05/20/17 0600 05/20/17 0700  BP: 113/83 124/77 113/67 117/67  Pulse: (!) 106 (!) 107 97 (!) 109  Resp: (!) 25 (!) 26 20 (!) 23  Temp:      TempSrc:      SpO2: 96% (!) 76% 99% 100%  Weight: 157 lb 6.5 oz (71.4 kg)     Height:       No focal weakness Incision looks fine  LABS:   Lab Results  Component Value Date   WBC 7.3 05/20/2017   HGB 10.8 (L) 05/20/2017   HCT 33.5 (L) 05/20/2017   MCV 89.6 05/20/2017   PLT 302 05/20/2017   Lab Results  Component Value Date   CREATININE 1.32 (H) 05/19/2017   Lab Results  Component Value Date   INR 2.67 05/20/2017   CBG (last 3)   Recent Labs  05/19/17 1652 05/19/17 2118 05/20/17 0421  GLUCAP 128* 106* 123*    PROBLEM LIST:    Principal Problem:   Asymptomatic stenosis of right carotid artery Active Problems:   Long term current use of anticoagulant therapy   Hyperlipidemia   Persistent atrial fibrillation (HCC)   Elevated troponin- unclear etiology   Hypotension due to blood loss   CURRENT MEDS:   . aspirin EC  325 mg Oral Daily  . atorvastatin  40 mg Oral q1800  . docusate sodium  100 mg Oral Daily  . glimepiride  1 mg Oral Q breakfast  . insulin aspart  0-9 Units Subcutaneous TID WC  . mouth rinse  15 mL Mouth Rinse BID  . metFORMIN  500 mg Oral Q breakfast  . metoprolol tartrate  25 mg Oral BID  . pantoprazole  40 mg Oral Daily  . Marissa   Oral Once    Gae Gallop Beeper: [phone] Office: [phone] 05/20/2017

## 2017-05-20 NOTE — Progress Notes (Addendum)
Progress Note  Patient Name: Kylie Sanchez Date of Encounter: 05/20/2017  Primary Cardiologist: Allred  Patient Profile     83 y.o. female hx of PAF on warfarin, symptomatic bradycardia with PPM ([employer]), diabetes type 2, hypertension, hyperlipidemia who is being seen today for the evaluation of chest pain and elevated troponinsat the request of Dr. Trula Slade. Pt is S/P right carotid endarterectomy on 05/13/17 and redo on 05/14/17.  - Minimal Troponin elevation on initial evaluation thought to be low risk for Cardiac Event.  Events 7/8: Transferred to CCU yesterday PM due to Hypotension.  Still noted CP, but better Transfused PRBC - with appropriate bump in Hgb. Troponin 8.8 on recheck - per note, IV Heparin ordered (stopped overnight 2/2 INR~3, although not ordered for this) -> no other orders. Afib rates up to 150s yesterday - IV Amiodarone bolus given (thought to be due to BB being held for hypotension.  Principal Problem:   Asymptomatic stenosis of right carotid artery Active Problems:   Long term current use of anticoagulant therapy   Persistent atrial fibrillation (HCC)   Elevated troponin- unclear etiology   Hypotension due to blood loss   Hyperlipidemia   Dilated cardiomyopathy (HCC)   Subjective   No further CP. NO SOB.  She was able to ambulate in the hallway yesterday without any significant symptoms. Seems frustrated Has not had any chest pain since her transfusion  Inpatient Medications    Scheduled Meds: . aspirin EC  81 mg Oral Daily  . atorvastatin  40 mg Oral q1800  . docusate sodium  100 mg Oral Daily  . glimepiride  1 mg Oral Q breakfast  . insulin aspart  0-9 Units Subcutaneous TID WC  . mouth rinse  15 mL Mouth Rinse BID  . metoprolol tartrate  25 mg Oral QID  . pantoprazole  40 mg Oral Daily  . [place]   Oral Once   Continuous Infusions: . sodium chloride    . sodium chloride 10 mL/hr at 05/20/17 0700  . magnesium sulfate 1 - 4 g  bolus IVPB     PRN Meds: sodium chloride, acetaminophen **OR** acetaminophen, alum & mag hydroxide-simeth, bisacodyl, guaiFENesin-dextromethorphan, hydrALAZINE, labetalol, magnesium sulfate 1 - 4 g bolus IVPB, metoprolol tartrate, morphine injection, ondansetron, phenol, potassium chloride   Vital Signs    Vitals:   05/20/17 0600 05/20/17 0700 05/20/17 1100 05/20/17 1400  BP: 113/67 117/67 101/82 115/73  Pulse: 97 (!) 109 (!) 116 (!) 134  Resp: 20 (!) 23 (!) 21 (!) 28  Temp:  97.9 F (36.6 C) 97.9 F (36.6 C)   TempSrc:  Oral Oral   SpO2: 99% 100% 99% 92%  Weight:      Height:        Intake/Output Summary (Last 24 hours) at 05/20/17 1551 Last data filed at 05/20/17 1400  Gross per 24 hour  Intake              890 ml  Output              325 ml  Net              565 ml   Filed Weights   05/14/17 0232 05/19/17 0300 05/20/17 0400  Weight: 150 lb 5.7 oz (68.2 kg) 156 lb 8.4 oz (71 kg) 157 lb 6.5 oz (71.4 kg)    Telemetry    Afib in 90s-120s - Personally Reviewed  ECG    No new EKG  Physical Exam  Physical Exam  Constitutional: She is oriented to person, place, and time. She appears well-nourished. No distress.  HENT:  Head: Normocephalic and atraumatic.  Mouth/Throat: Oropharynx is clear and moist.  Eyes: Left eye exhibits no discharge.  L Eye bandaid in place - s/p enucleation. R eye EOM, PRRL  Neck: Normal range of motion. Neck supple. No JVD present.  R CEA scar healing well   Cardiovascular: Normal heart sounds and normal pulses.  An irregularly irregular rhythm present. Tachycardia present.   Pulmonary/Chest: Effort normal and breath sounds normal. No respiratory distress. She has no wheezes. She has no rales.  Abdominal: Soft. Bowel sounds are normal. She exhibits no distension. There is no tenderness. There is no rebound and no guarding.  Musculoskeletal: Normal range of motion.  L UE 2+ swelling from elbow distally BLE - 1+ edema  Neurological: She is  alert and oriented to person, place, and time.  Skin: Skin is warm and dry. No erythema.  Psychiatric:  Flat mood & affect  Nursing note and vitals reviewed.  Labs    Chemistry  Recent Labs Lab 05/14/17 0346 05/19/17 1630  NA 137 139  K 5.1 3.9  CL 110 118*  CO2 20* 15*  GLUCOSE 175* 114*  BUN 22* 28*  CREATININE 1.07* 1.32*  CALCIUM 8.3* 7.0*  GFRNONAA 45* 35*  GFRAA 52* 40*  ANIONGAP 7 6     Hematology  Recent Labs Lab 05/18/17 1837 05/19/17 0209 05/20/17 0645  WBC 12.8* 11.0* 7.3  RBC 3.77* 3.86* 3.74*  HGB 10.9* 11.2* 10.8*  HCT 33.6* 34.1* 33.5*  MCV 89.1 88.3 89.6  MCH 28.9 29.0 28.9  MCHC 32.4 32.8 32.2  RDW 16.8* 17.2* 17.0*  PLT 275 256 302    Cardiac Enzymes  Recent Labs Lab 05/18/17 1837 05/19/17 1032 05/19/17 1630 05/19/17 2219  TROPONINI 8.80* 6.37* 6.85* 7.75*   No results for input(s): TROPIPOC in the last 168 hours.   BNPNo results for input(s): BNP, PROBNP in the last 168 hours.   DDimer No results for input(s): DDIMER in the last 168 hours.   Radiology    No results found.  Cardiac Studies   NO new studies.  Echo from June 2018: normal LV size, but ? Severe LVH. EF 55-60%. ~ GR 1 DD. Small mobile calcified deposit on AoValve. Otw normal Echo.  2-D echo 05/20/2017: EF 35-40% with hypokinesis in the apical segments elliptically severe in the anterior and anteroseptal and inferior distribution. Favor stress cardiomyopathy, but cannot exclude (wraparound) LAD distribution ischemia.   Assessment & Plan    Principal Problem:   Asymptomatic stenosis of right carotid artery - s/p redo CEA  Active Problems:   Long term current use of anticoagulant therapy - ON Warfarin for Afib;  Will hold warfarin today until we figure out what to do with elevated Troponin.    Persistent atrial fibrillation (HCC) - rates are elevated, BB was on hold 2/2 hypotension;   Beta blocker restarted yesterday. Titrating up to 25 mg 4 times a day as  rate is still not adequately controlled. May need additional rate control  Restart warfarin  Will give 1 dose IV digoxin for additional rate control    Elevated troponin levels: Unclear etiology, but echocardiogram shows apical hypokinesis consistent with either LAD wall motion abnormalities or Takotsubo cardiomyopathy  (dilated cardiomyopathy) ? If this is related to hypotension & AFib RVR with Demand Ischemia vs. NSTEMI (since she did note CP)  Echo suggests Takotsubo, and this  fits the situation with hypotension and anemia. The only concerning feature is that she also had chest pain that got better with transfusion.  I had a long discussion with the patient and her family members. . We discussed different options for evaluating this wall motion abnormalities noted on echo including:   Invasive evaluation with cardiac catheterization and possible PCI,   simply medical management without any further evaluation,   or third middle option which would be aggressive medical management with rate control, aspirin and statin then plan to recheck EF in roughly a month or so to see if there is any improvement. Could also consider outpatient coronary CTA.   The patient clearly indicated that she would prefer the third option. She did not seem interested in invasive procedures at this time and would prefer to "wait things out". He clearly indicated that given her age she is tired and does not want any more procedures at this time.    Hyperlipidemia - was not on statin @ home.  With carotid Dz & ? MI, would recommend starting Atorvastatin.  Plan for now:   Restart warfarin & continue ASA.   Hypotension due to blood loss - BP better today post transfusion.  ARB, CCB are still being held - would continue to hold until we see how she does on beta blocker  Gradually titrate BB dose  Provided there is no further anginal discomfort, would plan to reevaluate with an echocardiogram +/- CT angiogram in the  outpatient setting.   I think she is okay for transfer to telemetry. If stable with improved rate over the next day or so would probably be ready for discharge.   Signed, Glenetta Hew, MD  05/20/2017, 3:51 PM

## 2017-05-20 NOTE — Progress Notes (Signed)
  Echocardiogram 2D Echocardiogram has been performed.  Kylie Sanchez 05/20/2017, 12:05 PM

## 2017-05-20 NOTE — Progress Notes (Signed)
ANTICOAGULATION CONSULT NOTE - Follow Up Consult  Pharmacy Consult for warfarin Indication: atrial fibrillation  Allergies  Allergen Reactions  . No Known Allergies     Patient Measurements: Height: 5\' 2"  (157.5 cm) Weight: 157 lb 6.5 oz (71.4 kg) IBW/kg (Calculated) : 50.1  Vital Signs: Temp: 97.6 F (36.4 C) (07/10 1500) Temp Source: Oral (07/10 1500) BP: 100/64 (07/10 1600) Pulse Rate: 92 (07/10 1600)  Labs:  Recent Labs  05/18/17 0151  05/18/17 1837 05/19/17 0209 05/19/17 1032 05/19/17 1630 05/19/17 2219 05/20/17 0645  HGB 7.5*  --  10.9* 11.2*  --   --   --  10.8*  HCT 24.0*  --  33.6* 34.1*  --   --   --  33.5*  PLT 275  --  275 256  --   --   --  302  LABPROT 17.1*  --   --  31.8*  --   --   --  29.0*  INR 1.38  --   --  3.00  --   --   --  2.67  HEPARINUNFRC  --   --   --  0.24*  --   --   --   --   CREATININE  --   --   --   --   --  1.32*  --   --   TROPONINI  --   < > 8.80*  --  6.37* 6.85* 7.75*  --   < > = values in this interval not displayed.  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 Was on heparin, but this was held 2/2 large jump in INR. INR now at goal for indication and pharmacy consulted to restart. Will give a lower dose than normal since INR has not fallen very much in comparison to precipitous rise. No bleeding noted. CBC stable.    Goal of Therapy:  INR 2-3 Monitor platelets by anticoagulation protocol: Yes   Plan:  Give warfarin 2mg  x1 tonight Daily INR and CBC Monitor S/Sx of bleeding.   Dierdre Harness, PharmD Clinical Pharmacist [phone] (Pager) 05/20/2017 4:35 PM

## 2017-05-20 NOTE — Progress Notes (Signed)
ANTICOAGULATION CONSULT NOTE - Follow Up Consult  Pharmacy Consult for Heparin (warfarin on hold) Indication: atrial fibrillation and elevated troponin  Allergies  Allergen Reactions  . No Known Allergies    Patient Measurements: Height: 5\' 2"  (157.5 cm) Weight: 157 lb 6.5 oz (71.4 kg) IBW/kg (Calculated) : 50.1 Vital Signs: Temp: 97.9 F (36.6 C) (07/10 0700) Temp Source: Oral (07/10 0700) BP: 117/67 (07/10 0700) Pulse Rate: 109 (07/10 0700)  Labs:  Recent Labs  05/18/17 0151  05/18/17 1837 05/19/17 0209 05/19/17 1032 05/19/17 1630 05/19/17 2219 05/20/17 0645  HGB 7.5*  --  10.9* 11.2*  --   --   --  10.8*  HCT 24.0*  --  33.6* 34.1*  --   --   --  33.5*  PLT 275  --  275 256  --   --   --  302  LABPROT 17.1*  --   --  31.8*  --   --   --  29.0*  INR 1.38  --   --  3.00  --   --   --  2.67  HEPARINUNFRC  --   --   --  0.24*  --   --   --   --   CREATININE  --   --   --   --   --  1.32*  --   --   TROPONINI  --   < > 8.80*  --  6.37* 6.85* 7.75*  --   < > = values in this interval not displayed. Estimated Creatinine Clearance: 26.7 mL/min (A) (by C-G formula based on SCr of 1.32 mg/dL (H)).  Assessment: 83 yo female s/p  carotid endarterectomy, was on warfarin for afib, found to have elevated troponin and started on heparin. Heparin is on hold for INR= 2.67 and plans noted for cath (warfarin on hold).    Goal of Therapy:  Heparin level 0.3-0.7 units/ml Monitor platelets by anticoagulation protocol: Yes   Plan:  -Hold heparin  -Re-start heparin when INR <2 -Will follow cath plans  Hildred Laser, Pharm D 05/20/2017 9:36 AM

## 2017-05-21 LAB — BASIC METABOLIC PANEL
Anion gap: 8 (ref 5–15)
BUN: 31 mg/dL — AB (ref 6–20)
CO2: 17 mmol/L — ABNORMAL LOW (ref 22–32)
CREATININE: 1.28 mg/dL — AB (ref 0.44–1.00)
Calcium: 8.6 mg/dL — ABNORMAL LOW (ref 8.9–10.3)
Chloride: 114 mmol/L — ABNORMAL HIGH (ref 101–111)
GFR calc Af Amer: 42 mL/min — ABNORMAL LOW (ref >60)
GFR, EST NON AFRICAN AMERICAN: 36 mL/min — AB (ref >60)
GLUCOSE: 104 mg/dL — AB (ref 65–99)
POTASSIUM: 4.6 mmol/L (ref 3.5–5.1)
SODIUM: 139 mmol/L (ref 135–145)

## 2017-05-21 LAB — CBC
HCT: 33.8 % — ABNORMAL LOW (ref 36.0–46.0)
Hemoglobin: 10.8 g/dL — ABNORMAL LOW (ref 12.0–15.0)
MCH: 29 pg (ref 26.0–34.0)
MCHC: 32 g/dL (ref 30.0–36.0)
MCV: 90.9 fL (ref 78.0–100.0)
PLATELETS: 333 10*3/uL (ref 150–400)
RBC: 3.72 MIL/uL — ABNORMAL LOW (ref 3.87–5.11)
RDW: 16.7 % — ABNORMAL HIGH (ref 11.5–15.5)
WBC: 7.7 10*3/uL (ref 4.0–10.5)

## 2017-05-21 LAB — GLUCOSE, CAPILLARY
GLUCOSE-CAPILLARY: 119 mg/dL — AB (ref 65–99)
Glucose-Capillary: 107 mg/dL — ABNORMAL HIGH (ref 65–99)
Glucose-Capillary: 147 mg/dL — ABNORMAL HIGH (ref 65–99)
Glucose-Capillary: 110 mg/dL — ABNORMAL HIGH (ref 65–99)

## 2017-05-21 LAB — PROTIME-INR
INR: 2.46
PROTHROMBIN TIME: 27.1 s — AB (ref 11.4–15.2)

## 2017-05-21 MED ORDER — METOPROLOL TARTRATE 50 MG PO TABS
50.0000 mg | ORAL_TABLET | Freq: Four times a day (QID) | ORAL | Status: DC
  Administered 2017-05-21 – 2017-05-22 (×4): 50 mg via ORAL
  Filled 2017-05-21 (×4): qty 1

## 2017-05-21 MED ORDER — FUROSEMIDE 20 MG PO TABS
20.0000 mg | ORAL_TABLET | Freq: Once | ORAL | Status: AC
  Administered 2017-05-22: 20 mg via ORAL
  Filled 2017-05-21: qty 1

## 2017-05-21 MED ORDER — WARFARIN SODIUM 2.5 MG PO TABS
2.5000 mg | ORAL_TABLET | Freq: Once | ORAL | Status: AC
  Administered 2017-05-21: 2.5 mg via ORAL
  Filled 2017-05-21: qty 1

## 2017-05-21 NOTE — Progress Notes (Signed)
   VASCULAR SURGERY ASSESSMENT & PLAN:   8 Days Post-Op s/p: Right CEA.  Left arm swelling: This is slightly improved. I did obtain a duplex scan which showed no evidence of DVT.  Cardiology does not plan any further workup at this time. Awaiting transfer to 4 E.  Should be ready for transfer to skilled nursing facility soon.  Continue physical therapy.  SUBJECTIVE:   No specific complaints this morning.  PHYSICAL EXAM:   Vitals:   05/21/17 0349 05/21/17 0400 05/21/17 0500 05/21/17 0600  BP:  100/62 124/75 126/82  Pulse:  84 98 (!) 104  Resp:  20 (!) 22 (!) 26  Temp: 98.1 F (36.7 C)     TempSrc: Oral     SpO2:  95% 99% 96%  Weight:      Height:       Left arm swelling a little better. Neuro intact. Right neck incision looks fine.  LABS:   Lab Results  Component Value Date   WBC 7.7 05/21/2017   HGB 10.8 (L) 05/21/2017   HCT 33.8 (L) 05/21/2017   MCV 90.9 05/21/2017   PLT 333 05/21/2017   Lab Results  Component Value Date   CREATININE 1.28 (H) 05/21/2017   Lab Results  Component Value Date   INR 2.46 05/21/2017   CBG (last 3)   Recent Labs  05/20/17 1158 05/20/17 1552 05/20/17 2205  GLUCAP 115* 131* 104*    PROBLEM LIST:    Principal Problem:   Asymptomatic stenosis of right carotid artery Active Problems:   Long term current use of anticoagulant therapy   Hyperlipidemia   Persistent atrial fibrillation (HCC)   Elevated troponin- unclear etiology   Hypotension due to blood loss   Dilated cardiomyopathy (HCC)   CURRENT MEDS:   . aspirin EC  81 mg Oral Daily  . atorvastatin  40 mg Oral q1800  . docusate sodium  100 mg Oral Daily  . glimepiride  1 mg Oral Q breakfast  . insulin aspart  0-9 Units Subcutaneous TID WC  . mouth rinse  15 mL Mouth Rinse BID  . metoprolol tartrate  25 mg Oral QID  . pantoprazole  40 mg Oral Daily  . [place]   Oral Once  . Warfarin - Pharmacist Dosing Inpatient   Does not apply [place]: [phone] Office: [phone] 05/21/2017

## 2017-05-21 NOTE — Progress Notes (Signed)
Made Ameria Sanjurjo (niece) aware of pt being transferred to [id number]. Report given to [facility]  on 4E. Caregiver at bedside and pt request to eat supper before being transferred.

## 2017-05-21 NOTE — Progress Notes (Signed)
Pt complains of being short of breath and ask for her Oxygen. O2 stat is 89 on RA. Pt put on O2 1.5l  O2 stat is now 94%. We'll continue to monitor.

## 2017-05-21 NOTE — Progress Notes (Signed)
Progress Note  Patient Name: Kylie Sanchez Date of Encounter: 05/21/2017  Primary Cardiologist: Allred  Patient Profile     83 y.o. female hx of PAF on warfarin, symptomatic bradycardia with PPM ([employer]), diabetes type 2, hypertension, hyperlipidemia who is being seen today for the evaluation of chest pain and elevated troponinsat the request of Dr. Trula Slade. Pt is S/P right carotid endarterectomy on 05/13/17 and redo on 05/14/17.  - Minimal Troponin elevation on initial evaluation thought to be low risk for Cardiac Event.  Events 7/8: Transferred to CCU yesterday PM due to Hypotension.  Still noted CP, but better Transfused PRBC - with appropriate bump in Hgb. Troponin 8.8 on recheck - per note, IV Heparin ordered (stopped overnight 2/2 INR~3, although not ordered for this) -> no other orders. Afib rates up to 150s yesterday - IV Amiodarone bolus given (thought to be due to BB being held for hypotension.  Principal Problem:   Asymptomatic stenosis of right carotid artery Active Problems:   Long term current use of anticoagulant therapy   Persistent atrial fibrillation (HCC)   Elevated troponin- unclear etiology   Hypotension due to blood loss   Hyperlipidemia   Dilated cardiomyopathy ([place])   Subjective   Feels better today. No chest pain or sense of rapid heartbeat. Heart rates into better control. Appetite seems to be better. Blood pressure is better. She did note a little bit of dyspnea today that she had not mentioned before  Inpatient Medications    Scheduled Meds: . aspirin EC  81 mg Oral Daily  . atorvastatin  40 mg Oral q1800  . docusate sodium  100 mg Oral Daily  . glimepiride  1 mg Oral Q breakfast  . insulin aspart  0-9 Units Subcutaneous TID WC  . mouth rinse  15 mL Mouth Rinse BID  . metoprolol tartrate  50 mg Oral QID  . pantoprazole  40 mg Oral Daily  . [place]   Oral Once  . warfarin  2.5 mg Oral ONCE-1800  . Warfarin - Pharmacist Dosing  Inpatient   Does not apply q1800   Continuous Infusions: . sodium chloride    . sodium chloride 10 mL/hr at 05/20/17 2000  . magnesium sulfate 1 - 4 g bolus IVPB     PRN Meds: sodium chloride, acetaminophen **OR** acetaminophen, alum & mag hydroxide-simeth, bisacodyl, guaiFENesin-dextromethorphan, hydrALAZINE, labetalol, magnesium sulfate 1 - 4 g bolus IVPB, metoprolol tartrate, morphine injection, ondansetron, phenol, potassium chloride   Vital Signs    Vitals:   05/21/17 0803 05/21/17 0900 05/21/17 1000 05/21/17 1137  BP:    (!) 134/94  Pulse:  100 (!) 110 (!) 109  Resp: (!) 26 (!) 24 (!) 23 (!) 26  Temp:    97.7 F (36.5 C)  TempSrc:    Oral  SpO2:  97% 95% 98%  Weight:      Height:        Intake/Output Summary (Last 24 hours) at 05/21/17 1316 Last data filed at 05/21/17 1300  Gross per 24 hour  Intake              750 ml  Output              716 ml  Net               34 ml   Filed Weights   05/14/17 0232 05/19/17 0300 05/20/17 0400  Weight: 150 lb 5.7 oz (68.2 kg) 156 lb 8.4 oz (71 kg)  157 lb 6.5 oz (71.4 kg)    Telemetry    Continues to be in A. fib in the 90s to 110s. Personally Reviewed  ECG    No new EKG  Physical Exam   Physical Exam  Constitutional: She is oriented to person, place, and time. She appears well-nourished. No distress.  HENT:  Head: Normocephalic and atraumatic.  Mouth/Throat: Oropharynx is clear and moist.  Eyes: Left eye exhibits no discharge.  L Eye bandaid in place - s/p enucleation. R eye EOM, PRRL  Neck: Normal range of motion. Neck supple. No JVD present.  R CEA scar healing well   Cardiovascular: Normal heart sounds, intact distal pulses and normal pulses.  An irregularly irregular rhythm present.  Pulmonary/Chest: Effort normal and breath sounds normal. No respiratory distress. She has no wheezes. She has no rales.  Abdominal: Soft. Bowel sounds are normal. She exhibits no distension. There is no tenderness. There is no  rebound and no guarding.  Musculoskeletal: Normal range of motion.  L UE 2+ swelling from elbow distally BLE - 1+ edema  Neurological: She is alert and oriented to person, place, and time.  Skin: Skin is warm and dry. No erythema.  Psychiatric: She has a normal mood and affect. Thought content normal.  Flat mood & affect; somewhat poor memory  Nursing note and vitals reviewed.  Labs    Chemistry  Recent Labs Lab 05/19/17 1630 05/21/17 0233  NA 139 139  K 3.9 4.6  CL 118* 114*  CO2 15* 17*  GLUCOSE 114* 104*  BUN 28* 31*  CREATININE 1.32* 1.28*  CALCIUM 7.0* 8.6*  GFRNONAA 35* 36*  GFRAA 40* 42*  ANIONGAP 6 8     Hematology  Recent Labs Lab 05/19/17 0209 05/20/17 0645 05/21/17 0233  WBC 11.0* 7.3 7.7  RBC 3.86* 3.74* 3.72*  HGB 11.2* 10.8* 10.8*  HCT 34.1* 33.5* 33.8*  MCV 88.3 89.6 90.9  MCH 29.0 28.9 29.0  MCHC 32.8 32.2 32.0  RDW 17.2* 17.0* 16.7*  PLT 256 302 333    Cardiac Enzymes  Recent Labs Lab 05/18/17 1837 05/19/17 1032 05/19/17 1630 05/19/17 2219  TROPONINI 8.80* 6.37* 6.85* 7.75*   No results for input(s): TROPIPOC in the last 168 hours.   BNPNo results for input(s): BNP, PROBNP in the last 168 hours.   DDimer No results for input(s): DDIMER in the last 168 hours.   Radiology    No results found.  Cardiac Studies   NO new studies.  Echo from June 2018: normal LV size, but ? Severe LVH. EF 55-60%. ~ GR 1 DD. Small mobile calcified deposit on AoValve. Otw normal Echo.  2-D echo 05/20/2017: EF 35-40% with hypokinesis in the apical segments elliptically severe in the anterior and anteroseptal and inferior distribution. Favor stress cardiomyopathy, but cannot exclude (wraparound) LAD distribution ischemia.   Assessment & Plan    Principal Problem:   Asymptomatic stenosis of right carotid artery - s/p redo CEA  Active Problems:   Long term current use of anticoagulant therapy - ON Warfarin for Afib;  Back on warfarin with no  plans for invasive cardiac evaluation.    Persistent atrial fibrillation (HCC) - rates are elevated, BB was on hold 2/2 hypotension;   Tolerating lower dose of beta blocker. Will increase to 50 nightly. If she tolerates this, her back on her home dose of Toprol 100 twice a day  Group started warfarin  Heart rate seems to improve, will give IV digoxin today and  tomorrow but not plan for oral therapy as an outpatient.    Elevated troponin levels: Unclear etiology, but echocardiogram shows apical hypokinesis consistent with either LAD wall motion abnormalities or Takotsubo cardiomyopathy  (dilated cardiomyopathy) - echo read suggests Takotsubo, however cannot exclude LAD ischemia.  Based on discussion with the patient yesterday, plans are conservative management with rate control and restarting her cardiac medications once able (she was on losartan as an outpatient. This can be restarted at a lower dose depending on what her blood pressure does with increased dose of beta blocker.)  As she does have a reduced ejection fraction, I will give her dose of Lasix today given her dyspnea and start low-dose oral Lasix tomorrow.  She indicated that she would not be interested in invasive evaluation with catheterization. I think we can therefore consider management medically for now and consider outpatient assessment with a follow-up echo in about a month to month and a half. Can then consider stress testing versus coronary CTA at if her EF remains reduced.    Hyperlipidemia - started Atorvastatin 40 mg  Plan for now:   Continue warfarin and aspirin.    Gradually titrate BB dose - increasing to 50 mA 4 times a day. (Would convert to her home dose of Toprol 200 mg daily prior to discharge)   ARB, CCB are still being held - would continue to hold until we see how she does on beta blocker  Provided there is no further anginal discomfort, would plan to reevaluate with an echocardiogram +/- CT angiogram in  the outpatient setting.   She has orders for transfer to telemetry. Anticipate discharge to skilled nursing facility for rehabilitation soon.   Signed, Glenetta Hew, MD  05/21/2017, 1:16 PM

## 2017-05-21 NOTE — Progress Notes (Signed)
Patient arrived to floor from ICU.; Currently sitting up in chair, patch over left eye. No signs of distress. Contact precautions sign placed over door (pcr positive for MRSA). Handoff to next nurse as report given to this nurse before patient transported

## 2017-05-21 NOTE — Progress Notes (Signed)
ANTICOAGULATION CONSULT NOTE - Follow Up Consult  Pharmacy Consult for warfarin Indication: atrial fibrillation  Allergies  Allergen Reactions  . No Known Allergies     Patient Measurements: Height: 5\' 2"  (157.5 cm) Weight: 157 lb 6.5 oz (71.4 kg) IBW/kg (Calculated) : 50.1  Vital Signs: Temp: 97.5 F (36.4 C) (07/11 0802) Temp Source: Oral (07/11 0802) BP: 139/88 (07/11 0802) Pulse Rate: 93 (07/11 0700)  Labs:  Recent Labs  05/19/17 0209 05/19/17 1032 05/19/17 1630 05/19/17 2219 05/20/17 0645 05/21/17 0233  HGB 11.2*  --   --   --  10.8* 10.8*  HCT 34.1*  --   --   --  33.5* 33.8*  PLT 256  --   --   --  302 333  LABPROT 31.8*  --   --   --  29.0* 27.1*  INR 3.00  --   --   --  2.67 2.46  HEPARINUNFRC 0.24*  --   --   --   --   --   CREATININE  --   --  1.32*  --   --  1.28*  TROPONINI  --  6.37* 6.85* 7.75*  --   --     Assessment: 15 yoF with possible Takotsubo cardiomyopathy on warfarin (on PTA)  for persistent afib.  It was on hold for INR elevation INR (increased 1.3>>3.0 from 7/8 to 7/9) and possible plans for cath. No plans for intervention now anc warfarin has been resumed  Home coumadin dose: 5mg /day except 2.5mg  STTS  Goal of Therapy:  INR 2-3 Monitor platelets by anticoagulation protocol: Yes   Plan:  -Coumadin 2.5mg  po today -Daily PT/INR  Hildred Laser, Pharm D 05/21/2017 10:24 AM

## 2017-05-22 ENCOUNTER — Other Ambulatory Visit: Payer: Self-pay | Admitting: Cardiology

## 2017-05-22 DIAGNOSIS — R7989 Other specified abnormal findings of blood chemistry: Principal | ICD-10-CM

## 2017-05-22 DIAGNOSIS — L8990 Pressure ulcer of unspecified site, unspecified stage: Secondary | ICD-10-CM | POA: Insufficient documentation

## 2017-05-22 DIAGNOSIS — R079 Chest pain, unspecified: Secondary | ICD-10-CM

## 2017-05-22 DIAGNOSIS — R778 Other specified abnormalities of plasma proteins: Secondary | ICD-10-CM

## 2017-05-22 LAB — GLUCOSE, CAPILLARY
GLUCOSE-CAPILLARY: 139 mg/dL — AB (ref 65–99)
Glucose-Capillary: 133 mg/dL — ABNORMAL HIGH (ref 65–99)

## 2017-05-22 LAB — CBC
HCT: 35.3 % — ABNORMAL LOW (ref 36.0–46.0)
Hemoglobin: 10.9 g/dL — ABNORMAL LOW (ref 12.0–15.0)
MCH: 27.9 pg (ref 26.0–34.0)
MCHC: 30.9 g/dL (ref 30.0–36.0)
MCV: 90.5 fL (ref 78.0–100.0)
PLATELETS: 376 10*3/uL (ref 150–400)
RBC: 3.9 MIL/uL (ref 3.87–5.11)
RDW: 16 % — AB (ref 11.5–15.5)
WBC: 6.4 10*3/uL (ref 4.0–10.5)

## 2017-05-22 LAB — PROTIME-INR
INR: 2.67
Prothrombin Time: 28.9 seconds — ABNORMAL HIGH (ref 11.4–15.2)

## 2017-05-22 MED ORDER — METOPROLOL SUCCINATE ER 100 MG PO TB24
100.0000 mg | ORAL_TABLET | Freq: Two times a day (BID) | ORAL | Status: DC
  Administered 2017-05-22 – 2017-05-26 (×9): 100 mg via ORAL
  Filled 2017-05-22 (×9): qty 1

## 2017-05-22 MED ORDER — FUROSEMIDE 20 MG PO TABS
20.0000 mg | ORAL_TABLET | Freq: Every day | ORAL | Status: DC
  Administered 2017-05-23 – 2017-05-26 (×4): 20 mg via ORAL
  Filled 2017-05-22 (×4): qty 1

## 2017-05-22 MED ORDER — WARFARIN SODIUM 2.5 MG PO TABS
2.5000 mg | ORAL_TABLET | Freq: Once | ORAL | Status: AC
  Administered 2017-05-22: 2.5 mg via ORAL
  Filled 2017-05-22: qty 1

## 2017-05-22 NOTE — Progress Notes (Signed)
Assumed care of pt.  Pt sitting in recliner. Pt in stable condition with no c/o of pain.  VS stable. Call bell within reach.

## 2017-05-22 NOTE — Progress Notes (Signed)
Physical Therapy Treatment Patient Details Name: Kylie Sanchez MRN: [id number] DOB: 09-Apr-1925 Today's Date: 05/22/2017    History of Present Illness 83 yo admitted 83 y.o. female with hx of admitted for scheduled right CEA 7/3.  Post-operatively, pt developed left facial asymmetry.  CT showed abrupt occlusion ICA at distal end of patch; pt taken back to OR for resection with primary anastomosis PMhx: Afib, HTN, HLD, pacemaker, CKd, Left-eye enucleation due to basal cell carcinoma DM, SSS    PT Comments    Pt is making steady progress towards her goals. Pt minAx1 for bed mobility, modAx2 for stand pivot transfer to [facility], modAx2 for 4 feet ambulation with HHA to get from bedside commode to recliner. Pt requires skilled PT to progress gait training and to improve LE strength and balance to safely navigate in her discharge environment.    Follow Up Recommendations  SNF     Equipment Recommendations   (to be determined at next venue)    Recommendations for Other Services       Precautions / Restrictions Precautions Precautions: Fall Restrictions Weight Bearing Restrictions: No    Mobility  Bed Mobility Overal bed mobility: Needs Assistance Bed Mobility: Sit to Supine       Sit to supine: Min assist   General bed mobility comments: minA for LE mangement to floor and pad scoot of hips to EoB  Transfers Overall transfer level: Needs assistance Equipment used: Rolling walker (2 wheeled);None Transfers: Stand Pivot Transfers   Stand pivot transfers: Mod assist;+2 physical assistance       General transfer comment: modAx2 for stand pivot transfer to [facility],  Ambulation/Gait Ambulation/Gait assistance: Mod assist;+2 physical assistance Ambulation Distance (Feet): 4 Feet Assistive device: 2 person hand held assist Gait Pattern/deviations: Shuffle;Step-to pattern;Trunk flexed     General Gait Details: modAx2 for balance, pt able to take shuffling steps from [facility] to recliner vc for  upright posture and sequencing       Balance Overall balance assessment: Needs assistance Sitting-balance support: Feet supported;No upper extremity supported Sitting balance-Leahy Scale: Fair     Standing balance support: Bilateral upper extremity supported Standing balance-Leahy Scale: Poor                              Cognition Arousal/Alertness: Awake/alert Behavior During Therapy: Flat affect;WFL for tasks assessed/performed Overall Cognitive Status: Within Functional Limits for tasks assessed                                           General Comments General comments (skin integrity, edema, etc.): VSS      Pertinent Vitals/Pain Pain Assessment: No/denies pain           PT Goals (current goals can now be found in the care plan section) Acute Rehab PT Goals PT Goal Formulation: With patient Time For Goal Achievement: 05/22/17 Potential to Achieve Goals: Fair Progress towards PT goals: Progressing toward goals    Frequency    Min 2X/week      PT Plan Current plan remains appropriate       AM-PAC PT "6 Clicks" Daily Activity  Outcome Measure  Difficulty turning over in bed (including adjusting bedclothes, sheets and blankets)?: Total Difficulty moving from lying on back to sitting on the side of the bed? : Total Difficulty sitting down on and standing up  from a chair with arms (e.g., wheelchair, bedside commode, etc,.)?: Total Help needed moving to and from a bed to chair (including a wheelchair)?: A Little Help needed walking in hospital room?: A Lot Help needed climbing 3-5 steps with a railing? : Total 6 Click Score: 9    End of Session Equipment Utilized During Treatment: Gait belt Activity Tolerance: Patient tolerated treatment well Patient left: in chair;with call bell/phone within reach;with chair alarm set Nurse Communication: Mobility status PT Visit Diagnosis: Unsteadiness on feet (R26.81);Other abnormalities  of gait and mobility (R26.89);Difficulty in walking, not elsewhere classified (R26.2);Muscle weakness (generalized) (M62.81)     Time: 2518-9842 PT Time Calculation (min) (ACUTE ONLY): 19 min  Charges:  $Gait Training: 8-22 mins                    G Codes:       Jeanine Caven B. Migdalia Dk PT, DPT Acute Rehabilitation  [phone] Pager [phone]     [name] 05/22/2017, 4:41 PM

## 2017-05-22 NOTE — Progress Notes (Signed)
ANTICOAGULATION CONSULT NOTE - Follow Up Consult  Pharmacy Consult for warfarin Indication: atrial fibrillation  Allergies  Allergen Reactions  . No Known Allergies     Patient Measurements: Height: 5\' 2"  (157.5 cm) Weight: 157 lb 6.5 oz (71.4 kg) IBW/kg (Calculated) : 50.1  Vital Signs: Temp: 98.6 F (37 C) (07/12 0517) Temp Source: Oral (07/12 0517) Pulse Rate: 103 (07/11 2057)  Labs:  Recent Labs  05/19/17 1032 05/19/17 1630 05/19/17 2219  05/20/17 0645 05/21/17 0233 05/22/17 0352  HGB  --   --   --   < > 10.8* 10.8* 10.9*  HCT  --   --   --   --  33.5* 33.8* 35.3*  PLT  --   --   --   --  302 333 376  LABPROT  --   --   --   --  29.0* 27.1* 28.9*  INR  --   --   --   --  2.67 2.46 2.67  CREATININE  --  1.32*  --   --   --  1.28*  --   TROPONINI 6.37* 6.85* 7.75*  --   --   --   --   < > = values in this interval not displayed.  Assessment: 76 yoF with possible Takotsubo cardiomyopathy on warfarin (on PTA)  for persistent afib.  It was on hold for INR elevation INR (increased 1.3>>3.0 from 7/8 to 7/9 s/p amio bolus) and possible plans for cath. No plans for intervention now anc warfarin has been resumed. INR within range x 3 days, CBC stable.  Home coumadin dose: 5mg /day except 2.5mg  STTS  Goal of Therapy:  INR 2-3 Monitor platelets by anticoagulation protocol: Yes   Plan:  -Coumadin 2.5mg  po today -Daily PT/INR  Charlene Brooke, PharmD PGY1 [facility] Resident Pager: [phone]  05/22/2017 8:39 AM

## 2017-05-22 NOTE — Progress Notes (Signed)
Progress Note  Patient Name: Kylie Sanchez Date of Encounter: 05/22/2017  Primary Cardiologist: Allred  Patient Profile     83 y.o. female hx of PAF on warfarin, symptomatic bradycardia with PPM ([employer]), diabetes type 2, hypertension, hyperlipidemia who is being seen today for the evaluation of chest pain and elevated troponinsat the request of Dr. Trula Slade. Pt is S/P right carotid endarterectomy on 05/13/17 and redo on 05/14/17.  - Minimal Troponin elevation on initial evaluation thought to be low risk for Cardiac Event.  Events 7/8: Transferred to CCU yesterday PM due to Hypotension.  Still noted CP, but better Transfused PRBC - with appropriate bump in Hgb. Troponin 8.8 on recheck - per note, IV Heparin ordered (stopped overnight 2/2 INR~3, although not ordered for this) -> no other orders. Afib rates up to 150s- rate now better control  Principal Problem:   Asymptomatic stenosis of right carotid artery Active Problems:   Long term current use of anticoagulant therapy   Persistent atrial fibrillation (HCC)   Elevated troponin- unclear etiology   Hypotension due to blood loss   Hyperlipidemia   Dilated cardiomyopathy (HCC)   Pressure injury of skin   Subjective   A bit more tired today.  No Sx of irregular HR.  No CP.  NO SOB - just "not able to take deep enough breath"  Inpatient Medications    Scheduled Meds: . aspirin EC  81 mg Oral Daily  . atorvastatin  40 mg Oral q1800  . docusate sodium  100 mg Oral Daily  . [START ON 05/23/2017] furosemide  20 mg Oral Daily  . glimepiride  1 mg Oral Q breakfast  . insulin aspart  0-9 Units Subcutaneous TID WC  . mouth rinse  15 mL Mouth Rinse BID  . metoprolol succinate  100 mg Oral BID  . pantoprazole  40 mg Oral Daily  . [place]   Oral Once  . warfarin  2.5 mg Oral ONCE-1800  . Warfarin - Pharmacist Dosing Inpatient   Does not apply q1800   Continuous Infusions: . sodium chloride    . sodium chloride 10  mL/hr at 05/20/17 2000  . magnesium sulfate 1 - 4 g bolus IVPB     PRN Meds: sodium chloride, acetaminophen **OR** acetaminophen, alum & mag hydroxide-simeth, bisacodyl, guaiFENesin-dextromethorphan, hydrALAZINE, labetalol, magnesium sulfate 1 - 4 g bolus IVPB, metoprolol tartrate, morphine injection, ondansetron, phenol, potassium chloride   Vital Signs    Vitals:   05/21/17 2005 05/21/17 2057 05/22/17 0517 05/22/17 1321  BP:    114/72  Pulse:  (!) 103  87  Resp: 20  18 20   Temp: 98.7 F (37.1 C)  98.6 F (37 C) (!) 97.5 F (36.4 C)  TempSrc: Oral  Oral Oral  SpO2:  95%  95%  Weight:      Height:        Intake/Output Summary (Last 24 hours) at 05/22/17 1346 Last data filed at 05/22/17 1323  Gross per 24 hour  Intake              660 ml  Output              700 ml  Net              -40 ml   Filed Weights   05/14/17 0232 05/19/17 0300 05/20/17 0400  Weight: 150 lb 5.7 oz (68.2 kg) 156 lb 8.4 oz (71 kg) 157 lb 6.5 oz (71.4 kg)  Telemetry    Afib 80s-110; intermittent paced beats.   Personally Reviewed  ECG    No new EKG  Physical Exam   Physical Exam  Constitutional: She is oriented to person, place, and time. Vital signs are normal. She appears well-nourished. She appears lethargic. She is cooperative. She is easily aroused. No distress.  Seems more drowsy today  HENT:  Head: Normocephalic and atraumatic.  Mouth/Throat: Oropharynx is clear and moist.  Eyes: Left eye exhibits no discharge.  L Eye bandaid in place - s/p enucleation. R eye EOM, PRRL  Neck: Normal range of motion. Neck supple. No JVD present.  R CEA scar healing well   Cardiovascular: Normal rate, normal heart sounds, intact distal pulses and normal pulses.  An irregularly irregular rhythm present.  Pulmonary/Chest: Effort normal and breath sounds normal. No respiratory distress. She has no wheezes. She has no rales.  Abdominal: Soft. Bowel sounds are normal. She exhibits no distension. There is  no tenderness. There is no rebound and no guarding.  Musculoskeletal: Normal range of motion.  L UE 2+ swelling from elbow distally BLE - 1+ edema  Neurological: She is oriented to person, place, and time and easily aroused. She appears lethargic.  Skin: Skin is warm and dry. No erythema.  Psychiatric: She has a normal mood and affect. Thought content normal.  Flat mood & affect; somewhat poor memory  Nursing note and vitals reviewed.  Labs    Chemistry  Recent Labs Lab 05/19/17 1630 05/21/17 0233  NA 139 139  K 3.9 4.6  CL 118* 114*  CO2 15* 17*  GLUCOSE 114* 104*  BUN 28* 31*  CREATININE 1.32* 1.28*  CALCIUM 7.0* 8.6*  GFRNONAA 35* 36*  GFRAA 40* 42*  ANIONGAP 6 8     Hematology  Recent Labs Lab 05/20/17 0645 05/21/17 0233 05/22/17 0352  WBC 7.3 7.7 6.4  RBC 3.74* 3.72* 3.90  HGB 10.8* 10.8* 10.9*  HCT 33.5* 33.8* 35.3*  MCV 89.6 90.9 90.5  MCH 28.9 29.0 27.9  MCHC 32.2 32.0 30.9  RDW 17.0* 16.7* 16.0*  PLT 302 333 376    Cardiac Enzymes  Recent Labs Lab 05/18/17 1837 05/19/17 1032 05/19/17 1630 05/19/17 2219  TROPONINI 8.80* 6.37* 6.85* 7.75*   No results for input(s): TROPIPOC in the last 168 hours.   BNPNo results for input(s): BNP, PROBNP in the last 168 hours.   DDimer No results for input(s): DDIMER in the last 168 hours.   Radiology    No results found.  Cardiac Studies   NO new studies.  Echo from June 2018: normal LV size, but ? Severe LVH. EF 55-60%. ~ GR 1 DD. Small mobile calcified deposit on AoValve. Otw normal Echo.  2-D echo 05/20/2017: EF 35-40% with hypokinesis in the apical segments elliptically severe in the anterior and anteroseptal and inferior distribution. Favor stress cardiomyopathy, but cannot exclude (wraparound) LAD distribution ischemia.  Assessment & Plan    Principal Problem:   Asymptomatic stenosis of right carotid artery Active Problems:   Long term current use of anticoagulant therapy   Persistent  atrial fibrillation (HCC)   Elevated troponin- unclear etiology   Hypotension due to blood loss   Hyperlipidemia   Dilated cardiomyopathy (HCC)   Pressure injury of skin   Principal Problem:   Asymptomatic stenosis of right carotid artery - s/p redo CEA  Active Problems:   Long term current use of anticoagulant therapy - ON Warfarin for Afib;  Back on warfarin with no  plans for invasive cardiac evaluation.    Persistent atrial fibrillation (HCC) - rates are elevated, BB was on hold 2/2 hypotension;   Tolerating lower dose of beta blocker. Will increase to 50 nightly. If she tolerates this, her back on her home dose of Toprol 100 twice a day  Retarted warfarin  Remains in Afib, but rates    well controlled    Elevated troponin levels: Unclear etiology, but echocardiogram shows apical hypokinesis consistent with either LAD wall motion abnormalities or Takotsubo cardiomyopathy  (dilated cardiomyopathy) - echo read suggests Takotsubo, however cannot exclude LAD ischemia. Pt indicated desire to avoid invasive evaluation -- plan OP Echo after f/u.   She indicated that she would not be interested in invasive evaluation with catheterization. I think we can therefore consider management medically for now and consider outpatient assessment with a follow-up echo in about a month to month and a half. Can then consider stress testing versus coronary CTA at if her EF remains reduced.    Hyperlipidemia - started Atorvastatin 40 mg  Plan for now:   Continue warfarin and aspirin.    Tolerating Lopressor 50 mA 4 times a day. (Would convert to her home dose of Toprol 200 mg daily prior to discharge)   ARB, CCB are still being held - would continue to hold until we see how she does on beta blocker - can restart in OP setting   Starting low dose Lasix 20 mg pending OP re-eval.  Provided there is no further anginal discomfort, would plan to reevaluate with an echocardiogram +/- CT angiogram in  the outpatient setting - will order echo for ~4-5 weeks out..   She has orders for transfer to telemetry. Anticipate discharge to skilled nursing facility for rehabilitation soon.   Signed, Glenetta Hew, MD  05/22/2017, 1:46 PM

## 2017-05-22 NOTE — Care Management Note (Signed)
Case Management Note Marvetta Gibbons RN, BSN Unit 2W-Case Manager- [place] coverage [phone]  Patient Details  Name: Kylie Sanchez MRN: [id number] Date of Birth: 05-31-1925  Subjective/Objective:   Pt admitted s/p right carotid endarterectomy complicated by occlusion of right ICA in PACU, redo right carotid endarterectomy and resection of primary anastomosis                 Action/Plan: PTA pt lived at home- recommendations for SNF, CIR also consulted and following- CM received message from [facility] regarding preop referral for [facility]- plan at this time is for rehab CIR vs SNF- CM to follow  Expected Discharge Date:                  Expected Discharge Plan:  IP Rehab Facility  In-House Referral:  Clinical Social Work  Discharge planning Services  CM Consult  Post Acute Care Choice:    Choice offered to:     DME Arranged:    DME Agency:     HH Arranged:    [place] Agency:     Status of Service:  In process, will continue to follow  If discussed at Long Length of Stay Meetings, dates discussed:    Discharge Disposition:   Additional Comments:  05/20/17- 1000- Marvetta Gibbons RN, CM- per discussion in QC mtg plan for pt to d/c to SNF- per Dr A- pt will be a [facility] pass through from SNF with [facility]. - CSW continues to follow for SNF needs when medically stable.   Dawayne Patricia, RN 05/22/2017, 3:18 PM

## 2017-05-22 NOTE — Progress Notes (Signed)
Transferred care to Blanch Media, RN after giving handoff report.

## 2017-05-22 NOTE — Progress Notes (Addendum)
[facility] [place]  Subjective  - No chest pain this am.  Objective 116/63 (!) 103 98.6 F (37 C) (Oral) 18 95%  Intake/Output Summary (Last 24 hours) at 05/22/17 0745 Last data filed at 05/22/17 0517  Gross per 24 hour  Intake              300 ml  Output              700 ml  Net             -400 ml   Right neck incision healing well Active range of motion B UE intact Heart A fib Lungs 97 % on 2L O2  Assessment/Planning: POD # 9 right CEA  Events 7/8: Transferred to CCU yesterday PM due to Hypotension.  Still noted CP, but better Transfused PRBC - with appropriate bump in Hgb. Troponin 8.8 on recheck - per note, IV Heparin ordered (stopped overnight 2/2 INR~3, although not ordered for this) -> no other orders. Afib rates up to 150s yesterday - IV Amiodarone bolus given (thought to be due to BB being held for hypotension.  No further cardiology work up needed at this point.  Stable disposition from cardiology point of view. Pending SNF for rehab, patient lives alone.  Laurence Slate [facility] 05/22/2017 7:45 AM --  Laboratory Lab Results:  Recent Labs  05/21/17 0233 05/22/17 0352  WBC 7.7 6.4  HGB 10.8* 10.9*  HCT 33.8* 35.3*  PLT 333 376   BMET  Recent Labs  05/19/17 1630 05/21/17 0233  NA 139 139  K 3.9 4.6  CL 118* 114*  CO2 15* 17*  GLUCOSE 114* 104*  BUN 28* 31*  CREATININE 1.32* 1.28*  CALCIUM 7.0* 8.6*    COAG Lab Results  Component Value Date   INR 2.67 05/22/2017   INR 2.46 05/21/2017   INR 2.67 05/20/2017   No results found for: PTT  I have interviewed the patient and examined the patient. I agree with the findings by the PA.  Gae Gallop, MD [phone]

## 2017-05-22 NOTE — Progress Notes (Signed)
Inpatient Rehabilitation  Note that potential for IP Rehab has not significantly changed since consult 05/17/17.  Team is arranging [place] pass through from SNF to [facility].  Will sign off at this time.  Please call if there are questions.   Carmelia Roller., CCC/SLP Admission Coordinator  [place]  Cell [phone]

## 2017-05-23 ENCOUNTER — Telehealth: Payer: Self-pay | Admitting: Surgery

## 2017-05-23 LAB — CBC
HCT: 35.9 % — ABNORMAL LOW (ref 36.0–46.0)
Hemoglobin: 11.6 g/dL — ABNORMAL LOW (ref 12.0–15.0)
MCH: 29.1 pg (ref 26.0–34.0)
MCHC: 32.3 g/dL (ref 30.0–36.0)
MCV: 90.2 fL (ref 78.0–100.0)
PLATELETS: 388 10*3/uL (ref 150–400)
RBC: 3.98 MIL/uL (ref 3.87–5.11)
RDW: 15.5 % (ref 11.5–15.5)
WBC: 7.2 10*3/uL (ref 4.0–10.5)

## 2017-05-23 LAB — CULTURE, BLOOD (ROUTINE X 2)
Culture: NO GROWTH
Culture: NO GROWTH
SPECIAL REQUESTS: ADEQUATE
Special Requests: ADEQUATE

## 2017-05-23 LAB — GLUCOSE, CAPILLARY
GLUCOSE-CAPILLARY: 118 mg/dL — AB (ref 65–99)
GLUCOSE-CAPILLARY: 164 mg/dL — AB (ref 65–99)
Glucose-Capillary: 138 mg/dL — ABNORMAL HIGH (ref 65–99)
Glucose-Capillary: 145 mg/dL — ABNORMAL HIGH (ref 65–99)

## 2017-05-23 LAB — PROTIME-INR
INR: 2.83
PROTHROMBIN TIME: 30.3 s — AB (ref 11.4–15.2)

## 2017-05-23 MED ORDER — ASPIRIN 81 MG PO TBEC: 81.0000 mg | DELAYED_RELEASE_TABLET | Freq: Every day | ORAL | Status: DC

## 2017-05-23 MED ORDER — METFORMIN HCL ER 500 MG PO TB24: 500.0000 mg | ORAL_TABLET | ORAL | 11 refills | Status: DC

## 2017-05-23 MED ORDER — WARFARIN SODIUM 1 MG PO TABS
1.0000 mg | ORAL_TABLET | Freq: Once | ORAL | Status: AC
  Administered 2017-05-23: 1 mg via ORAL
  Filled 2017-05-23: qty 1

## 2017-05-23 MED ORDER — FUROSEMIDE 20 MG PO TABS: 20.0000 mg | ORAL_TABLET | Freq: Every day | ORAL | 0 refills | Status: DC

## 2017-05-23 MED ORDER — METOPROLOL SUCCINATE ER 200 MG PO TB24: ORAL_TABLET | ORAL | 1 refills | Status: DC

## 2017-05-23 MED ORDER — ATORVASTATIN CALCIUM 40 MG PO TABS: 40.0000 mg | ORAL_TABLET | Freq: Every day | ORAL | 11 refills | Status: DC

## 2017-05-23 NOTE — Progress Notes (Addendum)
Progress Note  Patient Name: Kylie Sanchez Date of Encounter: 05/23/2017  Primary Cardiologist: Allred  Patient Profile     83 y.o. female hx of PAF on warfarin, symptomatic bradycardia with PPM ([employer]), diabetes type 2, hypertension, hyperlipidemia who is being seen today for the evaluation of chest pain and elevated troponinsat the request of Dr. Trula Slade. Pt is S/P right carotid endarterectomy on 05/13/17 and redo on 05/14/17.  - Minimal Troponin elevation on initial evaluation thought to be low risk for Cardiac Event.  Events 7/8: Transferred to CCU (Sunday) PM due to Hypotension.  Still noted CP, but better Transfused PRBC - with appropriate bump in Hgb. Troponin 8.8 on recheck - per note, IV Heparin ordered (stopped overnight 2/2 INR~3, although not ordered for this) -> no other orders. Afib rates up to 150s- rate now better control  Principal Problem:   Asymptomatic stenosis of right carotid artery Active Problems:   Long term current use of anticoagulant therapy   Persistent atrial fibrillation (HCC)   Elevated troponin- unclear etiology   Hypotension due to blood loss   Hyperlipidemia   Dilated cardiomyopathy (HCC)   Pressure injury of skin   Subjective   A bit more tired today.  No Sx of irregular HR.  No CP.  NO SOB - - Much more awake today.   Did some walking in hall with PT -- D/c to SNF cancelled - ? Not strong enough  Inpatient Medications    Scheduled Meds: . aspirin EC  81 mg Oral Daily  . atorvastatin  40 mg Oral q1800  . docusate sodium  100 mg Oral Daily  . furosemide  20 mg Oral Daily  . glimepiride  1 mg Oral Q breakfast  . insulin aspart  0-9 Units Subcutaneous TID WC  . mouth rinse  15 mL Mouth Rinse BID  . metoprolol succinate  100 mg Oral BID  . pantoprazole  40 mg Oral Daily  . RESOURCE THICKENUP CLEAR   Oral Once  . Warfarin - Pharmacist Dosing Inpatient   Does not apply q1800   Continuous Infusions: . sodium chloride    . sodium  chloride 10 mL/hr at 05/20/17 2000  . magnesium sulfate 1 - 4 g bolus IVPB     PRN Meds: sodium chloride, acetaminophen **OR** acetaminophen, alum & mag hydroxide-simeth, bisacodyl, guaiFENesin-dextromethorphan, hydrALAZINE, labetalol, magnesium sulfate 1 - 4 g bolus IVPB, metoprolol tartrate, morphine injection, ondansetron, phenol, potassium chloride   Vital Signs    Vitals:   05/22/17 1321 05/22/17 1954 05/23/17 0444 05/23/17 1352  BP: 114/72 114/75 121/77 127/67  Pulse: 87 (!) 105 (!) 104   Resp: 20  (!) 22   Temp: (!) 97.5 F (36.4 C) 98.3 F (36.8 C) 98.6 F (37 C) 98 F (36.7 C)  TempSrc: Oral Oral Oral Oral  SpO2: 95% 96% 96% 96%  Weight:      Height:        Intake/Output Summary (Last 24 hours) at 05/23/17 1926 Last data filed at 05/23/17 1800  Gross per 24 hour  Intake              720 ml  Output              600 ml  Net              12 0 ml   Filed Weights   05/14/17 0232 05/19/17 0300 05/20/17 0400  Weight: 150 lb 5.7 oz (68.2 kg) 156 lb 8.4 oz (71  kg) 157 lb 6.5 oz (71.4 kg)    Telemetry    Now in NSR 80s   Personally Reviewed  ECG    No new EKG  Physical Exam   Physical Exam  Constitutional: She is oriented to person, place, and time. Vital signs are normal. She appears well-nourished. She is cooperative. She is easily aroused. No distress.  Seems more drowsy today  HENT:  Head: Normocephalic and atraumatic.  Mouth/Throat: Oropharynx is clear and moist.  Eyes: Left eye exhibits no discharge.  L Eye bandaid in place - s/p enucleation. R eye EOM, PRRL  Neck: Normal range of motion. Neck supple. No JVD present.  R CEA scar healing well   Cardiovascular: Normal rate, regular rhythm, normal heart sounds, intact distal pulses and normal pulses.   Pulmonary/Chest: Effort normal and breath sounds normal. No respiratory distress. She has no wheezes. She has no rales.  Abdominal: Soft. Bowel sounds are normal. She exhibits no distension. There is no  tenderness.  Musculoskeletal: Normal range of motion.  L UE 2+ swelling from elbow distally BLE - 1+ edema  Neurological: She is alert, oriented to person, place, and time and easily aroused.  Skin: Skin is warm and dry.  L arm still swollen - but less.  Psychiatric: She has a normal mood and affect. Thought content normal.  Flat mood & affect; somewhat poor memory  Nursing note and vitals reviewed.  Labs    Chemistry  Recent Labs Lab 05/19/17 1630 05/21/17 0233  NA 139 139  K 3.9 4.6  CL 118* 114*  CO2 15* 17*  GLUCOSE 114* 104*  BUN 28* 31*  CREATININE 1.32* 1.28*  CALCIUM 7.0* 8.6*  GFRNONAA 35* 36*  GFRAA 40* 42*  ANIONGAP 6 8     Hematology  Recent Labs Lab 05/21/17 0233 05/22/17 0352 05/23/17 0449  WBC 7.7 6.4 7.2  RBC 3.72* 3.90 3.98  HGB 10.8* 10.9* 11.6*  HCT 33.8* 35.3* 35.9*  MCV 90.9 90.5 90.2  MCH 29.0 27.9 29.1  MCHC 32.0 30.9 32.3  RDW 16.7* 16.0* 15.5  PLT 333 376 388    Cardiac Enzymes  Recent Labs Lab 05/18/17 1837 05/19/17 1032 05/19/17 1630 05/19/17 2219  TROPONINI 8.80* 6.37* 6.85* 7.75*   No results for input(s): TROPIPOC in the last 168 hours.   BNPNo results for input(s): BNP, PROBNP in the last 168 hours.   DDimer No results for input(s): DDIMER in the last 168 hours.   Radiology    No results found.  Cardiac Studies   NO new studies.  Echo from June 2018: normal LV size, but ? Severe LVH. EF 55-60%. ~ GR 1 DD. Small mobile calcified deposit on AoValve. Otw normal Echo.  2-D echo 05/20/2017: EF 35-40% with hypokinesis in the apical segments elliptically severe in the anterior and anteroseptal and inferior distribution. Favor stress cardiomyopathy, but cannot exclude (wraparound) LAD distribution ischemia.  Assessment & Plan    Principal Problem:   Asymptomatic stenosis of right carotid artery Active Problems:   Long term current use of anticoagulant therapy   Persistent atrial fibrillation (HCC)   Elevated  troponin- unclear etiology   Hypotension due to blood loss   Hyperlipidemia   Dilated cardiomyopathy (HCC)   Pressure injury of skin   Principal Problem:   Asymptomatic stenosis of right carotid artery - s/p redo CEA  Active Problems:   Long term current use of anticoagulant therapy - ON Warfarin for Afib;  Back on warfarin with no  plans for invasive cardiac evaluation.    Persistent atrial fibrillation (HCC) - rates are elevated, BB was on hold 2/2 hypotension;  -- Converted to NSR this PM  Tolerating beta blocker.  Converted to Toprol 100 twice a day - may need to start low dose Diltiazem for better rate control.  Retarted warfarin  Remains in Afib, but rates relatively well  controlled    Elevated troponin levels: Unclear etiology. Echocardiogram shows apical hypokinesis consistent with either LAD wall motion abnormalities or Takotsubo cardiomyopathy  (dilated cardiomyopathy) - echo reader suggests Takotsubo, however cannot exclude LAD ischemia. Pt indicated desire to avoid invasive evaluation -- plan OP Echo after f/u.   She indicated that she would not be interested in invasive evaluation with catheterization. I think we can therefore consider management medically for now and consider outpatient assessment with a follow-up echo in about a month to month and a half. Can then consider stress testing versus coronary CTA at if her EF remains reduced.    Hyperlipidemia - started Atorvastatin 40 mg  Plan for now:   Continue Statin, warfarin & ASA.    Converted to Toprol 100 mg BID   Continue to hold ARB, CCB - can restart in OP setting.   Starting low dose Lasix 20 mg pending OP re-eval.  No recurrent CP -- plan is to recheck Echo in 4-6 weeks to reassess EF.  Based upon this - could consider Coronary CTA for ischemia assessment.    D/c to SNF for "rehab" cancelled - family concerned that she is too weak.  OP f/u Echo & Appt with Dr. Rayann Heman made for 8/6.    Cardiology  will sign off pending d/c.  Please cal on call team over the weekend if issues or ?s come up.   Signed, Glenetta Hew, MD  05/23/2017, 7:26 PM

## 2017-05-23 NOTE — Progress Notes (Signed)
  Progress Note  Patient was to be discharged to SNF today. Was asked by CSW to talk to patient's niece Sharyn Lull this afternoon. The patient's niece had concerns that her aunt was not ready to go to SNF today and would need additional PT over the weekend prior to discharge. It was explained that she would receive more therapies at a SNF but the niece did not agree. She felt that if her aunt went to a nursing home today "she would just give up and die."  Will cancel discharge.   Virgina Jock, PA-C [facility] Office: [phone] Pager: [phone] 05/23/2017 3:20 PM

## 2017-05-23 NOTE — Progress Notes (Signed)
Charge nurse notified Social worker of patient order to discharge.

## 2017-05-23 NOTE — Progress Notes (Signed)
CSW coordinated discharge to SNF today. CSW contacted patient's niece, Sharyn Lull, to confirm discharge plan, and patient's niece expressed severe concern about the patient's transition to SNF today. [name] discussed how she was still concerned about the patient's heart rate and weakness. CSW explained that the transition to SNF was for rehabilitation and to work on improving the patient's strength; the patient will receive more physical therapy to improve weakness at SNF than at the hospital. [name] was adamant that patient not be transitioned today, and requested contact with the physician.   CSW alerted MD to contact patient's niece to discuss.   Laveda Abbe, [place] Clinical Social Worker [phone]  UPDATE 3:25 PM  CSW alerted by MD that patient will not be discharging today. CSW alerted facility. CSW will continue to follow to facilitate discharge to SNF when appropriate.  Laveda Abbe, [place] Clinical Social Worker [phone]

## 2017-05-23 NOTE — Progress Notes (Signed)
ANTICOAGULATION CONSULT NOTE - Follow Up Consult  Pharmacy Consult for warfarin Indication: atrial fibrillation  Allergies  Allergen Reactions  . No Known Allergies     Patient Measurements: Height: 5\' 2"  (157.5 cm) Weight: 157 lb 6.5 oz (71.4 kg) IBW/kg (Calculated) : 50.1  Vital Signs: Temp: 98.6 F (37 C) (07/13 0444) Temp Source: Oral (07/13 0444) BP: 121/77 (07/13 0444) Pulse Rate: 104 (07/13 0444)  Labs:  Recent Labs  05/21/17 0233 05/22/17 0352 05/23/17 0449  HGB 10.8* 10.9* 11.6*  HCT 33.8* 35.3* 35.9*  PLT 333 376 388  LABPROT 27.1* 28.9* 30.3*  INR 2.46 2.67 2.83  CREATININE 1.28*  --   --     Assessment: 79 yoF with possible Takotsubo cardiomyopathy on warfarin (on PTA)  for persistent afib. INR remains therapeutic 2.83. CBC stable  Home coumadin dose: 5mg /day except 2.5mg  STTS  Goal of Therapy:  INR 2-3 Monitor platelets by anticoagulation protocol: Yes   Plan:  -Warfarin 1mg  po today -Daily PT/INR -Monitor for s/sx of bleeding  Georga Bora, PharmD Clinical Pharmacist 05/23/2017 10:13 AM

## 2017-05-23 NOTE — Telephone Encounter (Signed)
Sched appt 06/09/17 at 10:45. Spoke to pt's niece.

## 2017-05-23 NOTE — Progress Notes (Addendum)
[facility] [place]  Subjective  - Feels weak and can't walk very good.  No complaints in regard to her CEA surgery.  Objective 121/77 (!) 104 98.6 F (37 C) (Oral) (!) 22 96%  Intake/Output Summary (Last 24 hours) at 05/23/17 0717 Last data filed at 05/23/17 0600  Gross per 24 hour  Intake              720 ml  Output             1550 ml  Net             -830 ml    Right neck incision healing well Left arm edema, compartments soft Heart A fib Lungs non labored breathing  Assessment/Planning: POD # 10 Right CEA  05/13/2017 Post op left facial droop.   CT scan shows an abrupt occlusion of the internal carotid artery at the distal end of the patch.   The patient was taken  back to the OR for exploration urgently.    Events 7/8: Transferred to CCU yesterday PM due to Hypotension.  Still noted CP, but better Transfused PRBC - with appropriate bump in Hgb. Troponin 8.8 on recheck - per note, IV Heparin ordered (stopped overnight 2/2 INR~3, although not ordered for this) -> no other orders. Afib rates up to 150s- rate now better control  Cardiology plan: Plan for now:   Continue warfarin and aspirin.    Tolerating Lopressor 50 mA 4 times a day. (Would convert to her home dose of Toprol 200 mg daily prior to discharge)   ARB, CCB are still being held - would continue to hold until we see how she does on beta blocker - can restart in OP setting   Starting low dose Lasix 20 mg pending OP re-eval.  Provided there is no further anginal discomfort, would plan to reevaluate with an echocardiogram +/- CT angiogram in the outpatient setting - will order echo for ~4-5 weeks out..  On Coumadin INR 2.83, HGB stable 11.6  Family member out of town until [place].  She wants her help to pick a SNF.  Laurence Slate [facility] 05/23/2017 7:17 AM --  Laboratory Lab Results:  Recent Labs  05/22/17 0352 05/23/17 0449  WBC 6.4 7.2  HGB 10.9* 11.6*  HCT 35.3*  35.9*  PLT 376 388   BMET  Recent Labs  05/21/17 0233  NA 139  K 4.6  CL 114*  CO2 17*  GLUCOSE 104*  BUN 31*  CREATININE 1.28*  CALCIUM 8.6*    COAG Lab Results  Component Value Date   INR 2.83 05/23/2017   INR 2.67 05/22/2017   INR 2.46 05/21/2017   No results found for: PTT  I have interviewed the patient and examined the patient. I agree with the findings by the PA. Awaiting decision on SNF when family gets back.  Cardiology to F/U as an outpt.  Gae Gallop, MD [phone]

## 2017-05-23 NOTE — Telephone Encounter (Signed)
-----   Message from Mena Goes, RN sent at 05/23/2017  2:37 PM EDT ----- Regarding: 2 weeks    ----- Message ----- From: Alvia Grove, PA-C Sent: 05/23/2017   2:11 PM To: Vvs Charge Pool  S/p right carotid endarterectomy with subsequent redo right carotid endarterectomy for thrombosis of right internal carotid artery 05/13/2017.   F/u with Dr. Trula Slade 2 weeks from now.   Thanks Maudie Mercury

## 2017-05-23 NOTE — Progress Notes (Signed)
Physical Therapy Treatment Patient Details Name: Kylie Sanchez MRN: [id number] DOB: Oct 01, 1925 Today's Date: 05/23/2017    History of Present Illness 83 yo admitted 83 y.o. female with hx of admitted for scheduled right CEA 7/3.  Post-operatively, pt developed left facial asymmetry.  CT showed abrupt occlusion ICA at distal end of patch; pt taken back to OR for resection with primary anastomosis PMhx: Afib, HTN, HLD, pacemaker, CKd, Left-eye enucleation due to basal cell carcinoma DM, SSS    PT Comments    Pt performed increased gait and required decreased assist during session.  Pt remains to benefit from skilled placement to improve strength and functional independence.  Plan next session for gait progression and therapeutic exercises.     Follow Up Recommendations  SNF     Equipment Recommendations   (TBD next level of care.  )    Recommendations for Other Services       Precautions / Restrictions Precautions Precautions: Fall Restrictions Weight Bearing Restrictions: No    Mobility  Bed Mobility               General bed mobility comments: Pt in recliner on arrival.    Transfers Overall transfer level: Needs assistance Equipment used: Rolling walker (2 wheeled);None Transfers: Risk manager;Sit to/from Stand Sit to Stand: Min assist;+2 physical assistance;+2 safety/equipment Stand pivot transfers: Mod assist;+2 safety/equipment (LOB posterior when backing to seated surface.  )       General transfer comment: Cues for hand placement to and from seated surface.  Pt required assistance to scoot posterior into recliner chair.    Ambulation/Gait Ambulation/Gait assistance: Mod assist;+2 safety/equipment Ambulation Distance (Feet): 40 Feet Assistive device: Rolling walker (2 wheeled) Gait Pattern/deviations: Step-through pattern;Decreased stride length;Shuffle;Trunk flexed   Gait velocity interpretation: Below normal speed for age/gender General Gait  Details: Cues for upper trunk control and RW position.  Close chair follow for safety.  O2 dropped during gait training and Required 3L to improve greater than 90%.     Stairs            Wheelchair Mobility    Modified Rankin (Stroke Patients Only)       Balance Overall balance assessment: Needs assistance Sitting-balance support: Feet supported;No upper extremity supported Sitting balance-Leahy Scale: Fair       Standing balance-Leahy Scale: Poor Standing balance comment: requires RW for steadying                            Cognition Arousal/Alertness: Awake/alert Behavior During Therapy: Flat affect;WFL for tasks assessed/performed Overall Cognitive Status: Within Functional Limits for tasks assessed                                        Exercises      General Comments        Pertinent Vitals/Pain Pain Assessment: No/denies pain    Home Living                      Prior Function            PT Goals (current goals can now be found in the care plan section) Acute Rehab PT Goals Patient Stated Goal: go home Potential to Achieve Goals: Fair Progress towards PT goals: Progressing toward goals    Frequency    Min 2X/week  PT Plan Current plan remains appropriate    Co-evaluation              AM-PAC PT "6 Clicks" Daily Activity  Outcome Measure  Difficulty turning over in bed (including adjusting bedclothes, sheets and blankets)?: Total Difficulty moving from lying on back to sitting on the side of the bed? : Total Difficulty sitting down on and standing up from a chair with arms (e.g., wheelchair, bedside commode, etc,.)?: Total Help needed moving to and from a bed to chair (including a wheelchair)?: A Little Help needed walking in hospital room?: A Little Help needed climbing 3-5 steps with a railing? : A Little 6 Click Score: 12    End of Session Equipment Utilized During Treatment: Gait  belt Activity Tolerance: Patient tolerated treatment well Patient left: in chair;with call bell/phone within reach;with chair alarm set Nurse Communication: Mobility status PT Visit Diagnosis: Unsteadiness on feet (R26.81);Other abnormalities of gait and mobility (R26.89);Difficulty in walking, not elsewhere classified (R26.2);Muscle weakness (generalized) (M62.81)     Time: 6546-5035 PT Time Calculation (min) (ACUTE ONLY): 27 min  Charges:  $Gait Training: 8-22 mins $Therapeutic Activity: 8-22 mins                    G Codes:       Governor Rooks, PTA pager [phone]    Cristela Blue 05/23/2017, 5:03 PM

## 2017-05-23 NOTE — Discharge Summary (Signed)
[facility] Discharge Summary  Kylie Sanchez 05-Feb-1925 83 y.o. female  [id number]  Admission Date: 05/13/2017  Discharge Date: 05/26/2017  Physician: Serafina Mitchell, MD  Admission Diagnosis: Right Internal Carotid Artery Stenosis I65.21 right carotid  HPI:   This is a 83 y.o. female who presented to the ER  b/c she sat down and thinks she lost consciousness for 3-4 seconds. She was holding her phone at that time time and when she "came to" she was still holding her phone.  She felt weak overall and scared and called 911.  She denies any chest pain or shortness of breath. She denies having dizziness or prior history of dizziness.   When she arrived at the ER, she was noted to have tachycardia and her heart rate had gotten as high as 175 with Afib with RVR.  She was given a dose of metoprolol and she converted back to NSR.  She does have a [employer] PPM, which was interrogated and revealed a sinus arrhythmia.  During her hospitalization, she had a carotid duplex, which revealed an 80-99% right carotid artery stenosis and vascular surgery is consulted.   She has retinopathy. Her right eye vision has gotten progressively worse over the years. 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 She denies any episodes of amaurosis fugax, sudden weakness or numbness of the extremities, receptive or expressive aphasia.   She has a hx of diabetes and takes Metformin.  She takes a CCB and ARB for hypertension.   She has been placed on a beta blocker here in the hospital.  She is on coumadin for Afib.  She has hx of CKD 3.  She denies every having chest pain. She has no history of TIA or stroke. She has never been a smoker.   She lives alone in an apartment and has been preparing her meals daily.   Hospital Course:  The patient was admitted to the hospital and taken to the operating room on 05/13/2017 underwent right carotid endarterectomy with bovine patch  angioplasty. The patient tolerated the procedure well and was transported to the PACU in stable condition.  In the PACU, the patient developed left facial droop and slurred speech. A code stroke was called  A stat CT of the head and CT angio of the neck was ordered as a carotid duplex was not able to obtained at that late hour. Her CT scan showed an abrupt occlusion of the internal carotid artery at the distal end of the patch. She continued to have some facial droop although she states that she has had some facial drooping since her eye surgery. She was also having some clumsiness and slight weakness in her left upper extremity.   She was taken emergently back to the OR for redo right carotid endarterectomy with bovine patch angioplasty and resection of primary anastomosis of a 2 cm segment of the internal carotid artery.   Intraoperative findings: Thrombosis of the internal carotid artery.  This appeared to be secondary to a redundant posterior wall to the internal carotid artery.  I resected approximately 2 cm the internal carotid artery and performed a primary anastomosis and then redid the patch angioplasty with bovine pericardium. She tolerated the procedure well and was transported to the PACU in stable condition.  Post-operatively her left facial droop improved. She still had some mild clumsiness to her left upper extremity but good grip strength. A swallow evaluation was ordered given some difficulties  the previous night. The patient developed some chest pain on the evening of 05/14/17. Troponins were mildly elevated at 0.05. Her pain resolved with SL nitroglycerin. Her EKG showed sinus rhythm with some ST and T wave abnormality with possible anterior ischemia.   She had another episode of chest pain early on 05/15/17 that resolved with morphine. Her troponins were minimally elevated and stayed stable. She was cleared for a diet. She was restarted on coumadin.   Cardiology was consulted regarding  elevated troponins, chest pain, and paroxysmal afib on 05/16/17. Cardiology felt that her troponin was elevated secondary to stress from surgery and atrial fibrillation and was unlikely ACS. Her device interrogation showed sinus tachycardia.   She was transfused 2 units of prbcs for symptomatic acute blood loss anemia. Her chest pain improved following transfusion.   The patient was hypotensive on 03/14/61 with systolic pressure as low as 60s. Still had chest pain with elevated troponins. Transferred to the ICU. UA/culture, blood cultures, CXR and repeat troponins were checked. Arm pressure same in both arms. Mentating around baseline with mild confusion. Also afib rates up to 150s. Thought this was secondary to BB being held for hypotension.   Per cardiology note on 05/20/17: Dr. Ellyn Hack "had a long discussion with the patient and her family members. . We discussed different options for evaluating this wall motion abnormalities noted on echo including:   Invasive evaluation with cardiac catheterization and possible PCI,   simply medical management without any further evaluation,   or third middle option which would be aggressive medical management with rate control, aspirin and statin then plan to recheck EF in roughly a month or so to see if there is any improvement. Could also consider outpatient coronary CTA.   The patient clearly indicated that she would prefer the third option. She did not seem interested in invasive procedures at this time and would prefer to "wait things out". He clearly indicated that given her age she is tired and does not want any more procedures at this time."   She had some left arm swelling on 05/21/17. A duplex scan was negative for DVT.   Her chest pain had resolved on 05/19/17. She had no further chest pain the remainder of her admission. The remainder of her hospitalization consisted of increasing mobilization. She was recommended SNF for continued rehab. She was to be  discharged to SNF on 05/23/17 however the patient's niece Sharyn Lull had multiple concerns regarding discharge. The niece felt that she would need additional physical therapy in the hospital before being discharged. Her discharge was cancelled.   The patient remained stable from a vascular and cardiac standpoint on 7/14 and 7/15. Palliative care was consulted on 05/25/17 to assist with goals of care. The patient and her POA were agreeable to SNF on 05/25/17. Given that she was unable to be admitted on the weekend, she was kept overnight with plans to discharg to SNF on 05/26/17. .  The patient requests palliative care to follow her while at her SNF.   No results for input(s): NA, K, CL, CO2, GLUCOSE, BUN, CALCIUM in the last 72 hours.  Invalid input(s): CRATININE  Recent Labs  05/26/17 0443  WBC 7.3  HGB 10.7*  HCT 33.8*  PLT 328    Recent Labs  05/25/17 0857 05/26/17 0443  INR 2.45 2.46    Discharge Instructions:   The patient is discharged to SNF with extensive instructions on wound care and progressive ambulation.  They are instructed not to  drive or perform any heavy lifting until returning to see the physician in his office.  Discharge Instructions    CAROTID Sugery: Call MD for difficulty swallowing or speaking; weakness in arms or legs that is a new symtom; severe headache.  If you have increased swelling in the neck and/or  are having difficulty breathing, CALL 911    Complete by:  As directed    Call MD for:  redness, tenderness, or signs of infection (pain, swelling, bleeding, redness, odor or green/yellow discharge around incision site)    Complete by:  As directed    Call MD for:  severe or increased pain, loss or decreased feeling  in affected limb(s)    Complete by:  As directed    Call MD for:  temperature >100.5    Complete by:  As directed    Discharge wound care:    Complete by:  As directed    Shower daily. Wash neck wound daily with soap and water. Do not pull  off the skin glue. It will come off on its own.   Driving Restrictions    Complete by:  As directed    No driving until seen in follow-up by vascular surgeon   Increase activity slowly    Complete by:  As directed    Walk with assistance use walker or cane as needed. Keep legs elevated to reduce swelling.   Lifting restrictions    Complete by:  As directed    No lifting for 2 weeks   Resume previous diet    Complete by:  As directed       Discharge Diagnosis:  Right Internal Carotid Artery Stenosis I65.21 right carotid  Secondary Diagnosis: Patient Active Problem List   Diagnosis Date Noted  . Palliative care encounter   . Goals of care, counseling/discussion   . Pressure injury of skin 05/22/2017  . Dilated cardiomyopathy ([place]) 05/20/2017  . Elevated troponin- unclear etiology 05/19/2017  . Hypotension due to blood loss 05/19/2017  . Asymptomatic stenosis of right carotid artery 05/13/2017  . Syncope 04/16/2017  . DM (diabetes mellitus), type 2 ([place]) 04/16/2017  . H/O enucleation of left eyeball 10/30/2015  . Macular degeneration, right eye 10/30/2015  . Pseudophakia, right eye 10/30/2015  . BMI 25.0-25.9,adult 09/26/2015  . Chronic rhinitis 05/05/2015  . Basal cell carcinoma 02/10/2015  . DM neuropathy, type II diabetes mellitus ([place]) 02/10/2015  . Persistent atrial fibrillation ([place]) 02/03/2015  . Diabetes mellitus ([place]) 11/24/2014  . T2_NIDDM w/Stage 3 CKD (GFR 40 ml/min) 11/08/2014  . Vitamin D deficiency 11/08/2014  . Medication management 11/08/2014  . BP (high blood pressure) 11/08/2014  . Polypharmacy 11/08/2014  . Encounter for therapeutic drug monitoring 01/03/2014  . Hyperlipidemia   . Sick sinus syndrome ([place])   . Retinopathy   . Long term current use of anticoagulant therapy 04/30/2012  . History of anticoagulant therapy 04/30/2012  . Tachycardia-bradycardia ([place]) 04/19/2012  . PAF (paroxysmal atrial fibrillation) ([place]) 04/18/2011  . Abnormal finding  on thyroid function test 04/18/2011  . HCD (hypertensive cardiovascular disease) 04/18/2011   Past Medical History:  Diagnosis Date  . Cancer ([place]) 2015   left eye  . Cataract   . CKD (chronic kidney disease) stage 3, GFR 30-59 ml/min    due to DM  . Dizziness   . Hyperlipidemia   . Hypertension   . Hypertensive cardiovascular disease   . Impaired vision   . Paroxysmal atrial fibrillation ([facility])   . Presence of permanent  cardiac pacemaker   . Retinopathy   . Sick sinus syndrome ([place])   . Type II or unspecified type diabetes mellitus without mention of complication, not stated as uncontrolled     Allergies as of 05/26/2017      Reactions   No Known Allergies       Medication List    STOP taking these medications   amLODipine 10 MG tablet Commonly known as:  NORVASC   lisinopril 40 MG tablet Commonly known as:  PRINIVIL,ZESTRIL     TAKE these medications   aspirin 81 MG EC tablet Take 1 tablet (81 mg total) by mouth daily.   atorvastatin 40 MG tablet Commonly known as:  LIPITOR Take 1 tablet (40 mg total) by mouth daily at 6 PM.   b complex vitamins tablet Take 1 tablet by mouth daily.   Co Q 10 100 MG Caps Take 100 mg by mouth every evening.   furosemide 20 MG tablet Commonly known as:  LASIX Take 1 tablet (20 mg total) by mouth daily.   glimepiride 2 MG tablet Commonly known as:  AMARYL Take 1-2 mg by mouth daily with breakfast.   Magnesium 250 MG Tabs Take 250 mg by mouth every evening.   metFORMIN 500 MG 24 hr tablet Commonly known as:  GLUCOPHAGE-XR Take 1 tablet (500 mg total) by mouth every morning. Take with meal for diabetes What changed:  how much to take   metoprolol succinate 100 MG 24 hr tablet Commonly known as:  TOPROL-XL Take 1 tablet (100 mg total) by mouth 2 (two) times daily. Take with or immediately following a meal. What changed:  medication strength  how much to take  how to take this  when to take this  additional  instructions   nitroGLYCERIN 0.4 MG SL tablet Commonly known as:  NITROSTAT Place 1 tablet (0.4 mg total) under the tongue every 5 (five) minutes as needed for chest pain.   VITAMIN D PO Take 1 tablet by mouth every evening.   warfarin 5 MG tablet Commonly known as:  COUMADIN Take 2.5-5 mg by mouth See admin instructions. Take 5 mg by mouth daily on Monday, Wednesday, Friday and take 2.5 mg by mouth on Sunday, Tuesday, Thursday, Saturday        Disposition: SNF  Patient's condition: is Fair  Follow up: 1. Dr.  Trula Slade in 2 weeks. 2. Dr. Rayann Heman (cardiology) 06/16/17 for ECHO  Virgina Jock, PA-C [facility] [phone]  --- For [facility] use --- Instructions: Press F2 to tab through selections.  Delete question if not applicable.   Modified Rankin score at D/C (0-6): 0  IV medication needed for:  1. Hypertension: No 2. Hypotension: No  Post-op Complications: Yes  1. Post-op CVA or TIA: No   2. CN injury: No  3. Myocardial infarction: No  4.  CHF: No  5.  Dysrhythmia (new): No  6. Wound infection: No  7. Reperfusion symptoms: No  8. Return to OR: Yes  If yes: return to OR for (bleeding, neurologic, other CEA incision, other): neurologic   Discharge medications: Statin use:  Yes If No: [ ]  For Medical reasons, [ ]  Non-compliant, [ ]  Not-indicated ASA use:  Yes  If No: [ ]  For Medical reasons, [ ]  Non-compliant, [ ]  Not-indicated Beta blocker use:  Yes If No: [ ]  For Medical reasons, [ ]  Non-compliant, [ ]  Not-indicated ACE-Inhibitor use:  No If No: [ ]  For Medical reasons, [ ]   Non-compliant, [x ] Not-indicated P2Y12 Antagonist use: No, [ ]  Plavix, [ ]  Plasugrel, [ ]  Ticlopinine, [ ]  Ticagrelor, [ ]  Other, [ ]  No for medical reason, [ ]  Non-compliant, [x ] Not-indicated Anti-coagulant use:  Yes, [ x] Warfarin, [ ]  Rivaroxaban, [ ]  Dabigatran, [ ]  Other, [ ]  No for medical reason, [ ]  Non-compliant, [ ]  Not-indicated

## 2017-05-24 LAB — GLUCOSE, CAPILLARY
GLUCOSE-CAPILLARY: 129 mg/dL — AB (ref 65–99)
GLUCOSE-CAPILLARY: 111 mg/dL — AB (ref 65–99)
Glucose-Capillary: 127 mg/dL — ABNORMAL HIGH (ref 65–99)
Glucose-Capillary: 180 mg/dL — ABNORMAL HIGH (ref 65–99)

## 2017-05-24 LAB — PROTIME-INR
INR: 2.93
PROTHROMBIN TIME: 31.2 s — AB (ref 11.4–15.2)

## 2017-05-24 MED ORDER — WARFARIN SODIUM 1 MG PO TABS
1.0000 mg | ORAL_TABLET | Freq: Once | ORAL | Status: AC
  Administered 2017-05-24: 1 mg via ORAL
  Filled 2017-05-24: qty 1

## 2017-05-24 NOTE — Progress Notes (Addendum)
  Progress Note  SUBJECTIVE:    POD #11  Doing ok this am. Says she will be ready for discharge to SNF tomorrow.   OBJECTIVE:   Vitals:   05/23/17 2028 05/24/17 0437  BP: 133/87 113/73  Pulse: (!) 106 81  Resp: 20 18  Temp: 98.1 F (36.7 C) (!) 97.5 F (36.4 C)    Intake/Output Summary (Last 24 hours) at 05/24/17 0940 Last data filed at 05/24/17 0500  Gross per 24 hour  Intake              590 ml  Output              350 ml  Net              240 ml   Left arm swelling stable. Right neck incision without hematoma Moving all extremities equally.   ASSESSMENT/PLAN:   83 y.o. female is s/p: right CEA 11 Days Post-Op   Post-op deconditioning: Continue to mobilize today. No CP.  Plan to d/c to SNF tomorrow.   Alvia Grove 05/24/2017 9:40 AM -- LABS:   CBC    Component Value Date/Time   WBC 7.2 05/23/2017 0449   HGB 11.6 (L) 05/23/2017 0449   HCT 35.9 (L) 05/23/2017 0449   PLT 388 05/23/2017 0449    BMET    Component Value Date/Time   NA 139 05/21/2017 0233   K 4.6 05/21/2017 0233   CL 114 (H) 05/21/2017 0233   CO2 17 (L) 05/21/2017 0233   GLUCOSE 104 (H) 05/21/2017 0233   BUN 31 (H) 05/21/2017 0233   CREATININE 1.28 (H) 05/21/2017 0233   CREATININE 1.08 (H) 04/22/2017 1536   CALCIUM 8.6 (L) 05/21/2017 0233   GFRNONAA 36 (L) 05/21/2017 0233   GFRNONAA 46 (L) 04/22/2017 1536   GFRAA 42 (L) 05/21/2017 0233   GFRAA 53 (L) 04/22/2017 1536    COAG Lab Results  Component Value Date   INR 2.93 05/24/2017   INR 2.83 05/23/2017   INR 2.67 05/22/2017   No results found for: PTT  ANTIBIOTICS:   Anti-infectives    Start     Dose/Rate Route Frequency Ordered Stop   05/14/17 0400  cefUROXime (ZINACEF) 1.5 g in dextrose 5 % 50 mL IVPB     1.5 g 100 mL/hr over 30 Minutes Intravenous Every 12 hours 05/14/17 0235 05/14/17 1701   05/13/17 1107  dextrose 5 % with cefUROXime (ZINACEF) ADS Med    Comments:  Forte, Lindsi   : cabinet override   05/13/17 1107 05/13/17 2314   05/13/17 1104  cefUROXime (ZINACEF) 1.5 g in dextrose 5 % 50 mL IVPB     1.5 g 100 mL/hr over 30 Minutes Intravenous 30 min pre-op 05/13/17 1104 05/13/17 1536       Virgina Jock, PA-C [facility] Office: [phone] Pager: [phone] 05/24/2017 9:40 AM   I have interviewed patient with PA and agree with assessment and plan above.   Brandon C. Donzetta Matters, MD [facility] [place] Office: [phone] Pager: [phone]

## 2017-05-24 NOTE — Progress Notes (Signed)
ANTICOAGULATION CONSULT NOTE - Follow Up Consult  Pharmacy Consult for warfarin Indication: atrial fibrillation  Allergies  Allergen Reactions  . No Known Allergies     Patient Measurements: Height: 5\' 2"  (157.5 cm) Weight: 157 lb 6.5 oz (71.4 kg) IBW/kg (Calculated) : 50.1  Vital Signs: Temp: 97.5 F (36.4 C) (07/14 0437) Temp Source: Oral (07/14 0437) BP: 113/73 (07/14 0437) Pulse Rate: 81 (07/14 0437)  Labs:  Recent Labs  05/22/17 0352 05/23/17 0449 05/24/17 0441  HGB 10.9* 11.6*  --   HCT 35.3* 35.9*  --   PLT 376 388  --   LABPROT 28.9* 30.3* 31.2*  INR 2.67 2.83 2.93    Assessment: 67 yoF with possible Takotsubo cardiomyopathy on warfarin (on PTA)  for persistent afib. INR remains therapeutic 2.93. CBC stable  Home coumadin dose: 5mg /day except 2.5mg  STTS  Goal of Therapy:  INR 2-3 Monitor platelets by anticoagulation protocol: Yes   Plan:  -Warfarin 1mg  PO today -Daily PT/INR -Monitor for s/sx of bleeding  Georga Bora, PharmD Clinical Pharmacist 05/24/2017 12:57 PM

## 2017-05-25 DIAGNOSIS — Z515 Encounter for palliative care: Secondary | ICD-10-CM

## 2017-05-25 DIAGNOSIS — Z7189 Other specified counseling: Secondary | ICD-10-CM

## 2017-05-25 LAB — GLUCOSE, CAPILLARY
GLUCOSE-CAPILLARY: 158 mg/dL — AB (ref 65–99)
Glucose-Capillary: 107 mg/dL — ABNORMAL HIGH (ref 65–99)
Glucose-Capillary: 155 mg/dL — ABNORMAL HIGH (ref 65–99)
Glucose-Capillary: 161 mg/dL — ABNORMAL HIGH (ref 65–99)

## 2017-05-25 LAB — PROTIME-INR
INR: 2.45
PROTHROMBIN TIME: 27.1 s — AB (ref 11.4–15.2)

## 2017-05-25 MED ORDER — WARFARIN SODIUM 5 MG PO TABS
2.5000 mg | ORAL_TABLET | Freq: Once | ORAL | Status: AC
  Administered 2017-05-25: 2.5 mg via ORAL
  Filled 2017-05-25: qty 1

## 2017-05-25 NOTE — Progress Notes (Addendum)
Progress Note  SUBJECTIVE:    POD #12  Patient is tearful this am. Doesn't want to go to rehab. Denies any chest pain or SOB.   OBJECTIVE:   Vitals:   05/25/17 0608 05/25/17 0745  BP: (!) 145/73   Pulse: 73 74  Resp:    Temp: (!) 97.3 F (36.3 C)     Intake/Output Summary (Last 24 hours) at 05/25/17 0816 Last data filed at 05/24/17 2100  Gross per 24 hour  Intake              360 ml  Output              450 ml  Net              -90 ml   Sitting in chair in NAD.  Right neck incision healing well. No hematoma.  Left arm is swollen. Tongue midline. 5/5 strength upper and lower extremities.   ASSESSMENT/PLAN:   83 y.o. female is s/p: right CEA 12 Days Post-Op   Patient stable from vascular surgery and cardiac standpoints for d/c to SNF.    Afib: continue coumadin.   Has not been progressing from a mobility standpoint due to deconditioning. She is tearful and not wanting to go to rehab. Was supposed to d/c to SNF on Friday but patient's POA/niece disagreed and wanted patient to stay. Plan was to d/c today but patient unwilling to do so. Wants therapy at home, but patient lives alone and will need around the clock assistance.  Have consulted palliative care for assistance.   Joelene Millin A Trinh 05/25/2017 8:16 AM -- LABS:   CBC    Component Value Date/Time   WBC 7.2 05/23/2017 0449   HGB 11.6 (L) 05/23/2017 0449   HCT 35.9 (L) 05/23/2017 0449   PLT 388 05/23/2017 0449    BMET    Component Value Date/Time   NA 139 05/21/2017 0233   K 4.6 05/21/2017 0233   CL 114 (H) 05/21/2017 0233   CO2 17 (L) 05/21/2017 0233   GLUCOSE 104 (H) 05/21/2017 0233   BUN 31 (H) 05/21/2017 0233   CREATININE 1.28 (H) 05/21/2017 0233   CREATININE 1.08 (H) 04/22/2017 1536   CALCIUM 8.6 (L) 05/21/2017 0233   GFRNONAA 36 (L) 05/21/2017 0233   GFRNONAA 46 (L) 04/22/2017 1536   GFRAA 42 (L) 05/21/2017 0233   GFRAA 53 (L) 04/22/2017 1536    COAG Lab Results  Component Value Date     INR 2.93 05/24/2017   INR 2.83 05/23/2017   INR 2.67 05/22/2017   No results found for: PTT  ANTIBIOTICS:   Anti-infectives    Start     Dose/Rate Route Frequency Ordered Stop   05/14/17 0400  cefUROXime (ZINACEF) 1.5 g in dextrose 5 % 50 mL IVPB     1.5 g 100 mL/hr over 30 Minutes Intravenous Every 12 hours 05/14/17 0235 05/14/17 1701   05/13/17 1107  dextrose 5 % with cefUROXime (ZINACEF) ADS Med    Comments:  Forte, Lindsi   : cabinet override      05/13/17 1107 05/13/17 2314   05/13/17 1104  cefUROXime (ZINACEF) 1.5 g in dextrose 5 % 50 mL IVPB     1.5 g 100 mL/hr over 30 Minutes Intravenous 30 min pre-op 05/13/17 1104 05/13/17 1536       Virgina Jock, PA-C [facility] Office: [phone] Pager: [phone] 05/25/2017 8:16 AM   I have independently interviewed patient and agree with PA  assessment and plan above. dispo pending.   Jase Reep C. Donzetta Matters, MD [facility] [place] Office: [phone] Pager: [phone]

## 2017-05-25 NOTE — Consult Note (Signed)
Consultation Note Date: 05/25/2017   Patient Name: Kylie Sanchez  DOB: 08-17-1925  MRN: [id number]  Age / Sex: 83 y.o., female  PCP: Unk Pinto, MD Referring Physician: Serafina Mitchell, MD  Reason for Consultation: Establishing goals of care  HPI/Patient Profile: 83 y.o. female  with past medical history of sick sinus syndrome s/p pace maker placement, DM, Afib, CKD, retinopathy who was admitted on 05/13/2017 after a syncopal episode.  She was found to have afib with RVR and right carotid stenosis.  While the carotid stenosis was thought to be an incidental finding, her family felt better having it corrected and the patient underwent endartectomy.  Her hospitalization was complicated by a cardiac event during which her troponin rose to 8.0.  The patient refused invasive cardiac work up and chose medical management.    Clinical Assessment and Goals of Care:  I have reviewed medical records including EPIC notes, labs and imaging, received report from the attending team, assessed the patient and discussed [place], disposition and options.  I introduced Palliative Medicine as specialized medical care for people living with serious illness. It focuses on providing relief from the symptoms and stress of a serious illness. The goal is to improve quality of life for both the patient and the family.  We discussed a brief life review of the patient. She and her sister lived together and never married.  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  In her retirement she moved to [facility], but returned when her sister became ill and wanted to come home.  Her sister passed.  She lives in her own apartment here is [place].  She has one brother who is frail.  She depends on her nephew Iran Sizer) and his wife Sharyn Lull) to assist with medical decision making and care.  Ms. Chamaine tells me she would not want to be  resuscitated in the event that her heart stopped or she stopped breathing.  Her niece Sharyn Lull confirms this.   We talked about discharge to SNF.  While Edra is apprehensive, she also understands that it is the next step in her recovery process.  Focus on building strength and potentially walking again will only happen in an acute rehab environment.  She understands.     Ms. Burgandy tells me that during this admission she has lost the ability to walk.  Her goal is to one day return home to her private apartment when she can walk again.  In speaking on the phone with Alla Feeling grand mother had a great experience with CIR after hip fracture.  Sharyn Lull stated that when CIR evaluated Sam[place] she was declining.  Now she is improved and Sharyn Lull ask that she be considered for re-evaluation for admission to CIR.  The family was encouraged to call with questions or concerns.       Primary Decision Maker:  PATIENT    SUMMARY OF RECOMMENDATIONS    Palliative Care to follow at SNF Patient has difficulty with insomnia  but does not want medication for it. She would like to start rehab ASAP to reduce swelling in her legs and hopefully walk again. Wean from oxygen if possible.  (She is not on oxygen at home normally) Palliative will sign off inpatient.  Please call us back if we can be of assistance.  Code Status/Advance Care Planning:  DNR  Prognosis:   Unable to determine  Discharge Planning: [place] for rehab with Palliative care service follow-up      Primary Diagnoses: Present on Admission: . Asymptomatic stenosis of right carotid artery . Hyperlipidemia . Persistent atrial fibrillation ([place]) . Elevated troponin- unclear etiology   I have reviewed the medical record, interviewed the patient and family, and examined the patient. The following aspects are pertinent.  Past Medical History:  Diagnosis Date  . Cancer ([place]) 2015   left eye  . Cataract     . CKD (chronic kidney disease) stage 3, GFR 30-59 ml/min    due to DM  . Dizziness   . Hyperlipidemia   . Hypertension   . Hypertensive cardiovascular disease   . Impaired vision   . Paroxysmal atrial fibrillation ([facility])   . Presence of permanent cardiac pacemaker   . Retinopathy   . Sick sinus syndrome ([place])   . Type II or unspecified type diabetes mellitus without mention of complication, not stated as uncontrolled    Social History   Social History  . Marital status: Single    Spouse name: N/A  . Number of children: N/A  . Years of education: N/A   Social History Main Topics  . Smoking status: Never Smoker  . Smokeless tobacco: Never Used  . Alcohol use No  . Drug use: No  . Sexual activity: Not Asked   Other Topics Concern  . None   Social History Narrative  . None   Family History  Problem Relation Age of Onset  . Heart attack Father   . Heart disease Father   . Hypertension Mother   . Heart disease Mother   . Cancer Other    Scheduled Meds: . aspirin EC  81 mg Oral Daily  . atorvastatin  40 mg Oral q1800  . docusate sodium  100 mg Oral Daily  . furosemide  20 mg Oral Daily  . glimepiride  1 mg Oral Q breakfast  . insulin aspart  0-9 Units Subcutaneous TID WC  . mouth rinse  15 mL Mouth Rinse BID  . metoprolol succinate  100 mg Oral BID  . pantoprazole  40 mg Oral Daily  . Binger   Oral Once  . warfarin  2.5 mg Oral ONCE-1800  . Warfarin - Pharmacist Dosing Inpatient   Does not apply q1800   Continuous Infusions: . sodium chloride    . sodium chloride 10 mL/hr at 05/20/17 2000  . magnesium sulfate 1 - 4 g bolus IVPB     PRN Meds:.sodium chloride, acetaminophen **OR** acetaminophen, alum & mag hydroxide-simeth, bisacodyl, guaiFENesin-dextromethorphan, hydrALAZINE, labetalol, magnesium sulfate 1 - 4 g bolus IVPB, metoprolol tartrate, morphine injection, ondansetron, phenol, potassium chloride Allergies  Allergen Reactions  . No  Known Allergies    Review of Systems No pain currently, normal bowel habits, no anorexia.  +belching, +weakness, +decreased ability to balance.  Physical Exam  Pleasant elderly female with 1 eye.  Sitting up in chair eating lunch A&O x 3 with clear speech.  NAD Extremities 2-3+ edema. Dry skin.  Vital Signs: BP (!) 142/91 (BP  Location: Right Arm)   Pulse 94   Temp 97.9 F (36.6 C) (Oral)   Resp 19   Ht 5\' 2"  (1.575 m)   Wt 68.3 kg (150 lb 8 oz)   SpO2 97%   BMI 27.53 kg/m  Pain Assessment: No/denies pain POSS *See Group Information*: 1-Acceptable,Awake and alert Pain Score: 0-No pain   SpO2: SpO2: 97 % O2 Device:SpO2: 97 % O2 Flow Rate: .O2 Flow Rate (L/min): 2 L/min  IO: Intake/output summary:  Intake/Output Summary (Last 24 hours) at 05/25/17 1314 Last data filed at 05/25/17 0745  Gross per 24 hour  Intake              360 ml  Output              250 ml  Net              110 ml    LBM: Last BM Date: 05/24/17 Baseline Weight: Weight: 63.5 kg (140 lb) Most recent weight: Weight: 68.3 kg (150 lb 8 oz)     Palliative Assessment/Data:   Flowsheet Rows     Most Recent Value  Intake Tab  Referral Department  Surgery  Unit at Time of Referral  Cardiac/Telemetry Unit  Palliative Care Primary Diagnosis  Cardiac  Date Notified  05/25/17  Palliative Care Type  New Palliative care  Reason for referral  Clarify Goals of Care  Date of Admission  05/13/17  Date first seen by Palliative Care  05/25/17  # of days Palliative referral response time  0 Day(s)  # of days IP prior to Palliative referral  12  Clinical Assessment  Palliative Performance Scale Score  40%  Psychosocial & Spiritual Assessment  Palliative Care Outcomes  Patient/Family meeting held?  Yes  Who was at the meeting?  patient, then spoke with Inez Pilgrim today.  Palliative Care Outcomes  Clarified goals of care      Time In: 12:30 Time Out: 1:50 Time Total: 80 min. Greater than 50%  of this  time was spent counseling and coordinating care related to the above assessment and plan.  Signed by: Florentina Jenny, PA-C Palliative Medicine Pager: [phone]  Please contact Palliative Medicine Team phone at [phone] for questions and concerns.  For individual provider: See Shea Evans

## 2017-05-25 NOTE — Progress Notes (Signed)
ANTICOAGULATION CONSULT NOTE - Follow Up Consult  Pharmacy Consult for warfarin Indication: atrial fibrillation  Allergies  Allergen Reactions  . No Known Allergies     Patient Measurements: Height: 5\' 2"  (157.5 cm) Weight: 150 lb 8 oz (68.3 kg) IBW/kg (Calculated) : 50.1  Vital Signs: Temp: 97.9 F (36.6 C) (07/15 0915) Temp Source: Oral (07/15 0915) BP: 142/91 (07/15 0915) Pulse Rate: 94 (07/15 0915)  Labs:  Recent Labs  05/23/17 0449 05/24/17 0441 05/25/17 0857  HGB 11.6*  --   --   HCT 35.9*  --   --   PLT 388  --   --   LABPROT 30.3* 31.2* 27.1*  INR 2.83 2.93 2.45    Assessment: 17 yoF with possible Takotsubo cardiomyopathy on warfarin (on PTA)  for persistent afib. INR remains therapeutic 2.45. CBC stable  Home coumadin dose: 5mg /day except 2.5mg  STTS  Goal of Therapy:  INR 2-3 Monitor platelets by anticoagulation protocol: Yes   Plan:  -Warfarin 2.5mg  X1 tonight -Daily PT/INR -Monitor for s/sx of bleeding  Georga Bora, PharmD Clinical Pharmacist 05/25/2017 11:14 AM

## 2017-05-25 NOTE — Progress Notes (Signed)
Patient refused to have labs drawn this a.m. 

## 2017-05-26 ENCOUNTER — Ambulatory Visit: Payer: Self-pay | Admitting: Physician Assistant

## 2017-05-26 DIAGNOSIS — R2681 Unsteadiness on feet: Secondary | ICD-10-CM | POA: Diagnosis not present

## 2017-05-26 DIAGNOSIS — R079 Chest pain, unspecified: Secondary | ICD-10-CM | POA: Diagnosis not present

## 2017-05-26 DIAGNOSIS — E785 Hyperlipidemia, unspecified: Secondary | ICD-10-CM | POA: Diagnosis not present

## 2017-05-26 DIAGNOSIS — Z9889 Other specified postprocedural states: Secondary | ICD-10-CM | POA: Diagnosis not present

## 2017-05-26 DIAGNOSIS — E119 Type 2 diabetes mellitus without complications: Secondary | ICD-10-CM | POA: Diagnosis not present

## 2017-05-26 DIAGNOSIS — R001 Bradycardia, unspecified: Secondary | ICD-10-CM | POA: Diagnosis not present

## 2017-05-26 DIAGNOSIS — I480 Paroxysmal atrial fibrillation: Secondary | ICD-10-CM | POA: Diagnosis not present

## 2017-05-26 DIAGNOSIS — I119 Hypertensive heart disease without heart failure: Secondary | ICD-10-CM | POA: Diagnosis not present

## 2017-05-26 DIAGNOSIS — I499 Cardiac arrhythmia, unspecified: Secondary | ICD-10-CM | POA: Diagnosis not present

## 2017-05-26 DIAGNOSIS — I081 Rheumatic disorders of both mitral and tricuspid valves: Secondary | ICD-10-CM | POA: Diagnosis not present

## 2017-05-26 DIAGNOSIS — R278 Other lack of coordination: Secondary | ICD-10-CM | POA: Diagnosis not present

## 2017-05-26 DIAGNOSIS — R531 Weakness: Secondary | ICD-10-CM | POA: Diagnosis not present

## 2017-05-26 DIAGNOSIS — F329 Major depressive disorder, single episode, unspecified: Secondary | ICD-10-CM | POA: Diagnosis not present

## 2017-05-26 DIAGNOSIS — I638 Other cerebral infarction: Secondary | ICD-10-CM | POA: Diagnosis not present

## 2017-05-26 DIAGNOSIS — I10 Essential (primary) hypertension: Secondary | ICD-10-CM | POA: Diagnosis not present

## 2017-05-26 DIAGNOSIS — R2689 Other abnormalities of gait and mobility: Secondary | ICD-10-CM | POA: Diagnosis not present

## 2017-05-26 DIAGNOSIS — I6521 Occlusion and stenosis of right carotid artery: Secondary | ICD-10-CM | POA: Diagnosis not present

## 2017-05-26 DIAGNOSIS — I4891 Unspecified atrial fibrillation: Secondary | ICD-10-CM | POA: Diagnosis not present

## 2017-05-26 DIAGNOSIS — Z7901 Long term (current) use of anticoagulants: Secondary | ICD-10-CM | POA: Diagnosis not present

## 2017-05-26 DIAGNOSIS — R600 Localized edema: Secondary | ICD-10-CM | POA: Diagnosis not present

## 2017-05-26 DIAGNOSIS — R41841 Cognitive communication deficit: Secondary | ICD-10-CM | POA: Diagnosis not present

## 2017-05-26 DIAGNOSIS — F3289 Other specified depressive episodes: Secondary | ICD-10-CM | POA: Diagnosis not present

## 2017-05-26 DIAGNOSIS — M6281 Muscle weakness (generalized): Secondary | ICD-10-CM | POA: Diagnosis not present

## 2017-05-26 DIAGNOSIS — I495 Sick sinus syndrome: Secondary | ICD-10-CM | POA: Diagnosis not present

## 2017-05-26 DIAGNOSIS — E1142 Type 2 diabetes mellitus with diabetic polyneuropathy: Secondary | ICD-10-CM | POA: Diagnosis not present

## 2017-05-26 DIAGNOSIS — R55 Syncope and collapse: Secondary | ICD-10-CM | POA: Diagnosis not present

## 2017-05-26 DIAGNOSIS — M1480 Arthropathies in other specified diseases classified elsewhere, unspecified site: Secondary | ICD-10-CM | POA: Diagnosis not present

## 2017-05-26 DIAGNOSIS — I2510 Atherosclerotic heart disease of native coronary artery without angina pectoris: Secondary | ICD-10-CM | POA: Diagnosis not present

## 2017-05-26 DIAGNOSIS — R29810 Facial weakness: Secondary | ICD-10-CM | POA: Diagnosis not present

## 2017-05-26 LAB — CBC
HCT: 33.8 % — ABNORMAL LOW (ref 36.0–46.0)
Hemoglobin: 10.7 g/dL — ABNORMAL LOW (ref 12.0–15.0)
MCH: 28.5 pg (ref 26.0–34.0)
MCHC: 31.7 g/dL (ref 30.0–36.0)
MCV: 90.1 fL (ref 78.0–100.0)
PLATELETS: 328 10*3/uL (ref 150–400)
RBC: 3.75 MIL/uL — AB (ref 3.87–5.11)
RDW: 14.6 % (ref 11.5–15.5)
WBC: 7.3 10*3/uL (ref 4.0–10.5)

## 2017-05-26 LAB — PROTIME-INR
INR: 2.46
Prothrombin Time: 27.2 seconds — ABNORMAL HIGH (ref 11.4–15.2)

## 2017-05-26 LAB — GLUCOSE, CAPILLARY
Glucose-Capillary: 141 mg/dL — ABNORMAL HIGH (ref 65–99)
Glucose-Capillary: 153 mg/dL — ABNORMAL HIGH (ref 65–99)

## 2017-05-26 MED ORDER — WARFARIN SODIUM 1 MG PO TABS
1.0000 mg | ORAL_TABLET | Freq: Once | ORAL | Status: AC
  Administered 2017-05-26: 1 mg via ORAL
  Filled 2017-05-26: qty 1

## 2017-05-26 MED ORDER — METOPROLOL SUCCINATE ER 100 MG PO TB24: 100.0000 mg | ORAL_TABLET | Freq: Two times a day (BID) | ORAL | 1 refills | Status: DC

## 2017-05-26 NOTE — Progress Notes (Signed)
  Progress Note    05/26/2017 5:30 AM 13 Days Post-Op  Subjective: no new issues overnight  Vitals:   05/25/17 1700 05/25/17 1944  BP:  140/68  Pulse: 97 84  Resp:  20  Temp:  98.3 F (36.8 C)    Physical Exam: aaox3 Moving all 4 Incision right neck cdi  CBC    Component Value Date/Time   WBC 7.2 05/23/2017 0449   RBC 3.98 05/23/2017 0449   HGB 11.6 (L) 05/23/2017 0449   HCT 35.9 (L) 05/23/2017 0449   PLT 388 05/23/2017 0449   MCV 90.2 05/23/2017 0449   MCH 29.1 05/23/2017 0449   MCHC 32.3 05/23/2017 0449   RDW 15.5 05/23/2017 0449   LYMPHSABS 1,943 04/22/2017 1536   MONOABS 469 04/22/2017 1536   EOSABS 402 04/22/2017 1536   BASOSABS 67 04/22/2017 1536    BMET    Component Value Date/Time   NA 139 05/21/2017 0233   K 4.6 05/21/2017 0233   CL 114 (H) 05/21/2017 0233   CO2 17 (L) 05/21/2017 0233   GLUCOSE 104 (H) 05/21/2017 0233   BUN 31 (H) 05/21/2017 0233   CREATININE 1.28 (H) 05/21/2017 0233   CREATININE 1.08 (H) 04/22/2017 1536   CALCIUM 8.6 (L) 05/21/2017 0233   GFRNONAA 36 (L) 05/21/2017 0233   GFRNONAA 46 (L) 04/22/2017 1536   GFRAA 42 (L) 05/21/2017 0233   GFRAA 53 (L) 04/22/2017 1536    INR    Component Value Date/Time   INR 2.46 05/26/2017 0443     Intake/Output Summary (Last 24 hours) at 05/26/17 0530 Last data filed at 05/25/17 2130  Gross per 24 hour  Intake              480 ml  Output                0 ml  Net              480 ml     Assessment:  83 y.o. female is s/p right CEA  Plan: snf today F/u with Dr. Shaune Leeks C. Donzetta Matters, MD [facility] [place] Office: [phone] Pager: [phone]  05/26/2017 5:30 AM

## 2017-05-26 NOTE — Progress Notes (Signed)
Patient discharged to [facility] report called to nurse Jennifer,family aware of discharge plan. Patient transported by [facility] services left floor via stretcher no c/o pain or shortness of breath at discharge. Discharge packet sent with [facility] staff.  Anjuli Gemmill, Tivis Ringer, RN

## 2017-05-26 NOTE — Progress Notes (Signed)
Clinical Social Worker facilitated patient discharge including contacting patient family and facility to confirm patient discharge plans.  Clinical information faxed to facility and family agreeable with plan.  CSW arranged ambulance transport via [facility] to [facility].  RN to call [phone] (pt will go in rm [phone]) report prior to discharge.  Clinical Social Worker will sign off for now as social work intervention is no longer needed. Please consult [place] again if new need arises.  Rhea Pink, MSW, [place]

## 2017-05-26 NOTE — Progress Notes (Signed)
Physical Therapy Treatment Patient Details Name: Kylie Sanchez MRN: [id number] DOB: 26-Jun-1925 Today's Date: 05/26/2017    History of Present Illness 83 yo admitted 82 y.o. female with hx of admitted for scheduled right CEA 7/3.  Post-operatively, pt developed left facial asymmetry.  CT showed abrupt occlusion ICA at distal end of patch; pt taken back to OR for resection with primary anastomosis PMhx: Afib, HTN, HLD, pacemaker, CKd, Left-eye enucleation due to basal cell carcinoma DM, SSS    PT Comments    Pt handled tx well today and continues to progress towards goals.  Pt continues to increase BLE and cardiovascular endurance. Pt continues to require a follow chair but has not utilized chair thus far. Pt slowly increasing ambulation distance and BLE strength. Pt remains to present with poor balance and is at an increased risk for falls if unassisted.  Plan to return to short term SNF remains appropriate to improve strength, balance and functional mobility before return home.  Focus for next tx on increasing BLE exercises and repetitions to continue to increase BLE strength and endurance.     Follow Up Recommendations  SNF     Equipment Recommendations       Recommendations for Other Services       Precautions / Restrictions Precautions Precautions: Fall Restrictions Weight Bearing Restrictions: No    Mobility  Bed Mobility               General bed mobility comments: Pt in recliner on arrival.    Transfers Overall transfer level: Needs assistance Equipment used: Standard walker Transfers: Stand Pivot Transfers;Sit to/from Stand Sit to Stand: Min assist;+2 physical assistance;+2 safety/equipment Stand pivot transfers: Min assist;+2 safety/equipment       General transfer comment: Cues for hand placement to and from seated surface.  Ambulation/Gait Ambulation/Gait assistance: Min assist;+2 safety/equipment Ambulation Distance (Feet): 60 Feet Assistive device:  Rolling walker (2 wheeled) Gait Pattern/deviations: Step-through pattern;Decreased step length - right;Decreased step length - left;Shuffle;Trunk flexed Gait velocity: slowed   General Gait Details: Cues for upper trunk control and RW position.  Close chair follow for safety.  Pt required x5 standing rest breaks for ~10 seconds.   Stairs            Wheelchair Mobility    Modified Rankin (Stroke Patients Only)       Balance Overall balance assessment: Needs assistance Sitting-balance support: Feet supported;No upper extremity supported Sitting balance-Leahy Scale: Fair     Standing balance support: Bilateral upper extremity supported;During functional activity Standing balance-Leahy Scale: Poor Standing balance comment: requires RW for steadying. pt c/o of BUE soreness from ambulating with RW. would beneift to have a youth walker to decrease elbow flexion and overuse of triceps.                             Cognition Arousal/Alertness: Awake/alert Behavior During Therapy: WFL for tasks assessed/performed;Flat affect Overall Cognitive Status: Within Functional Limits for tasks assessed                                 General Comments: Pt reports swelling in BLE prior to start of tx. SPTA notified nurse and suggested compression stockings.       Exercises Total Joint Exercises Ankle Circles/Pumps: AROM;20 reps;Both;Seated Heel Slides: AROM;Both;5 reps;Seated    General Comments General comments (skin integrity, edema, etc.): pt c/o of swelling  in BLE. pt reports no pain during palpation, just increased pressure. Collaberated with nurse and recommended compression stockings.       Pertinent Vitals/Pain Pain Assessment: No/denies pain    Home Living                      Prior Function            PT Goals (current goals can now be found in the care plan section) Acute Rehab PT Goals Patient Stated Goal: go home and to get rid of  the swelling in her legs.  PT Goal Formulation: With patient Potential to Achieve Goals: Fair Progress towards PT goals: Progressing toward goals    Frequency    Min 2X/week      PT Plan Current plan remains appropriate    Co-evaluation              AM-PAC PT "6 Clicks" Daily Activity  Outcome Measure  Difficulty turning over in bed (including adjusting bedclothes, sheets and blankets)?: Total Difficulty moving from lying on back to sitting on the side of the bed? : Total Difficulty sitting down on and standing up from a chair with arms (e.g., wheelchair, bedside commode, etc,.)?: Total Help needed moving to and from a bed to chair (including a wheelchair)?: A Little Help needed walking in hospital room?: A Little Help needed climbing 3-5 steps with a railing? : A Little 6 Click Score: 12    End of Session Equipment Utilized During Treatment: Gait belt;Oxygen Activity Tolerance: Patient tolerated treatment well;Patient limited by fatigue Patient left: in chair;with call bell/phone within reach;with chair alarm set Nurse Communication: Mobility status;Other (comment) (discussed compression stockings and RW fitting. ) PT Visit Diagnosis: Unsteadiness on feet (R26.81);Other abnormalities of gait and mobility (R26.89);Difficulty in walking, not elsewhere classified (R26.2);Muscle weakness (generalized) (M62.81)     Time: 3474-2595 PT Time Calculation (min) (ACUTE ONLY): 22 min  Charges:  $Gait Training: 8-22 mins                    G CodesRandalyn Rhea, [place] 05/26/2017, 1:20 PM

## 2017-05-26 NOTE — Progress Notes (Addendum)
ANTICOAGULATION CONSULT NOTE - Follow Up Consult  Pharmacy Consult for warfarin Indication: atrial fibrillation  Allergies  Allergen Reactions  . No Known Allergies     Patient Measurements: Height: 5\' 2"  (157.5 cm) Weight: 147 lb 14.4 oz (67.1 kg) IBW/kg (Calculated) : 50.1  Vital Signs: Temp: 97.2 F (36.2 C) (07/16 0528) Temp Source: Oral (07/16 0528) BP: 135/66 (07/16 0528) Pulse Rate: 75 (07/16 0528)  Labs:  Recent Labs  05/24/17 0441 05/25/17 0857 05/26/17 0443  HGB  --   --  10.7*  HCT  --   --  33.8*  PLT  --   --  328  LABPROT 31.2* 27.1* 27.2*  INR 2.93 2.Kylie 2.46    Assessment: Kylie Sanchez with possible Takotsubo cardiomyopathy on warfarin (on PTA)  for persistent afib. INR remains therapeutic - stable from yesterday. CBC stable. No bleeding noted. With plans for discharge today, will give warfarin dose sooner.   Home coumadin dose: 5mg /day except 2.5mg  STTS  Goal of Therapy:  INR 2-3 Monitor platelets by anticoagulation protocol: Yes   Plan:  -Warfarin 1mg  x1 prior to discharge -Daily PT/INR -Monitor for s/sx of bleeding  Dierdre Harness, PharmD Clinical Pharmacist [phone] (Pager) 05/26/2017 10:37 AM

## 2017-05-30 ENCOUNTER — Encounter: Payer: Self-pay | Admitting: Surgery

## 2017-06-01 DIAGNOSIS — F3289 Other specified depressive episodes: Secondary | ICD-10-CM | POA: Diagnosis not present

## 2017-06-01 DIAGNOSIS — E119 Type 2 diabetes mellitus without complications: Secondary | ICD-10-CM | POA: Diagnosis not present

## 2017-06-01 DIAGNOSIS — R600 Localized edema: Secondary | ICD-10-CM | POA: Diagnosis not present

## 2017-06-01 DIAGNOSIS — I2510 Atherosclerotic heart disease of native coronary artery without angina pectoris: Secondary | ICD-10-CM | POA: Diagnosis not present

## 2017-06-01 DIAGNOSIS — I638 Other cerebral infarction: Secondary | ICD-10-CM | POA: Diagnosis not present

## 2017-06-01 DIAGNOSIS — R2689 Other abnormalities of gait and mobility: Secondary | ICD-10-CM | POA: Diagnosis not present

## 2017-06-01 DIAGNOSIS — I480 Paroxysmal atrial fibrillation: Secondary | ICD-10-CM | POA: Diagnosis not present

## 2017-06-02 DIAGNOSIS — R531 Weakness: Secondary | ICD-10-CM | POA: Diagnosis not present

## 2017-06-06 ENCOUNTER — Encounter: Payer: Self-pay | Admitting: Surgery

## 2017-06-09 ENCOUNTER — Ambulatory Visit (INDEPENDENT_AMBULATORY_CARE_PROVIDER_SITE_OTHER): Payer: Self-pay | Admitting: Surgery

## 2017-06-09 ENCOUNTER — Encounter: Payer: Self-pay | Admitting: Surgery

## 2017-06-09 VITALS — BP 141/86 | HR 76 | Temp 97.9°F | Resp 16 | Ht 62.0 in | Wt 138.8 lb

## 2017-06-09 DIAGNOSIS — I6521 Occlusion and stenosis of right carotid artery: Secondary | ICD-10-CM

## 2017-06-09 NOTE — Progress Notes (Signed)
Patient name: Kylie Sanchez MRN: [id number] DOB: Aug 23, 1925 Sex: female  REASON FOR VISIT:     post op  HISTORY OF PRESENT ILLNESS:   Kylie Sanchez is a 83 y.o. female who returns today for her first postoperative visit.  The patient recently had a neurologic event and during her workup, she was found to have an 80% right carotid stenosis.  It was felt that her event which was a near syncopal episode was related to cardiac issues.  We had lengthy discussion about our treatment options but ultimately the patient and daughter wanted to proceed with right carotid endarterectomy.  Therefore on 05/13/2017 she was taken to the operating room and underwent a right carotid endarterectomy with bovine pericardial patch angioplasty.  She awoke from the operation neurologically intact and was taken to the recovery room.  In the recovery room she was felt to have a left facial droop.  She was sent downstairs for CT angiogram and her carotid was noted to be occluded.  I therefore took her back emergently to the operating room.  I found a redundant portion of the artery which was resected with primary anastomosis.  The patient awoke from the operation and symptoms were improved.  It was felt that at baseline because of the patient's eye surgery that her facial droop may have been baseline.  While in the hospital she had significant cardiac issues and had a significant myocardial event.  The patient refused intervention because of how she week she was.  She was ultimately discharged to [facility].  She has been gaining her strength back and can ambulate on her own.  She has had some episodes of atrial fibrillation.  CURRENT MEDICATIONS:    Current Outpatient Prescriptions  Medication Sig Dispense Refill  . aspirin EC 81 MG EC tablet Take 1 tablet (81 mg total) by mouth daily. 30 tablet   . atorvastatin (LIPITOR) 40 MG tablet Take 1 tablet (40 mg total) by mouth daily at 6 PM.  30 tablet 11  . b complex vitamins tablet Take 1 tablet by mouth daily.    . Cholecalciferol (VITAMIN D PO) Take 1 tablet by mouth every evening.     . Coenzyme Q10 (CO Q 10) 100 MG CAPS Take 100 mg by mouth every evening.     . escitalopram (LEXAPRO) 5 MG tablet Take 5 mg by mouth daily.    . furosemide (LASIX) 20 MG tablet Take 1 tablet (20 mg total) by mouth daily. 30 tablet 0  . glimepiride (AMARYL) 2 MG tablet Take 1-2 mg by mouth daily with breakfast.    . Magnesium 250 MG TABS Take 250 mg by mouth every evening.     . metFORMIN (GLUCOPHAGE-XR) 500 MG 24 hr tablet Take 1 tablet (500 mg total) by mouth every morning. Take with meal for diabetes 30 tablet 11  . metoprolol succinate (TOPROL-XL) 100 MG 24 hr tablet Take 1 tablet (100 mg total) by mouth 2 (two) times daily. Take with or immediately following a meal. 60 tablet 1  . warfarin (COUMADIN) 5 MG tablet Take 2.5-5 mg by mouth See admin instructions. Take 5 mg by mouth daily on Monday, Wednesday, Friday and take 2.5 mg by mouth on Sunday, Tuesday, Thursday, Saturday    . nitroGLYCERIN (NITROSTAT) 0.4 MG SL tablet Place 1 tablet (0.4 mg total) under the tongue every 5 (five) minutes as needed for chest pain. 100 tablet 3   No current facility-administered medications for this visit.  REVIEW OF SYSTEMS:   [X]  denotes positive finding, [ ]  denotes negative finding Cardiac  Comments:  Chest pain or chest pressure:    Shortness of breath upon exertion:    Short of breath when lying flat:    Irregular heart rhythm:    Constitutional    Fever or chills:      PHYSICAL EXAM:   Vitals:   06/09/17 1052 06/09/17 1054  BP: (!) 142/80 (!) 141/86  Pulse: 76   Resp: 16   Temp: 97.9 F (36.6 C)   TempSrc: Oral   SpO2: 97%   Weight: 138 lb 12.8 oz (63 kg)   Height: 5\' 2"  (1.575 m)     GENERAL: The patient is a well-nourished female, in no acute distress. The vital signs are documented above. CARDIOVASCULAR: There is a regular  rate and rhythm. PULMONARY: Non-labored respirations Palpable pedal pulses.  Right carotid incision is well-healed.  Equal strength bilaterally.  STUDIES:   None   MEDICAL ISSUES:   Carotid: The patient has made excellent recovery from her carotid endarterectomy with take back.  I did not see any specific neurologic deficits at this time.  I will have her follow-up in 6 months with repeat carotid duplex  A. fib: The patient is scheduled to have an echocardiogram in the near future.  She has had several intermittent episodes of atrial fibrillation at the nursing home.  She is on anticoagulation.  She is most likely going to need cardiac catheterization given her MI during hospital  Lower extremity swelling: This could be secondary to cardiac issues.  We have encouraged her to keep her legs elevated.  She could consider wearing tighter compression stockings.  [name], MD [facility] [phone] Pager [phone]

## 2017-06-10 DIAGNOSIS — E1142 Type 2 diabetes mellitus with diabetic polyneuropathy: Secondary | ICD-10-CM | POA: Diagnosis not present

## 2017-06-16 ENCOUNTER — Encounter: Payer: Self-pay | Admitting: Internal Medicine

## 2017-06-16 ENCOUNTER — Ambulatory Visit (INDEPENDENT_AMBULATORY_CARE_PROVIDER_SITE_OTHER): Payer: Medicare Other | Admitting: Internal Medicine

## 2017-06-16 ENCOUNTER — Other Ambulatory Visit: Payer: Self-pay

## 2017-06-16 ENCOUNTER — Encounter (INDEPENDENT_AMBULATORY_CARE_PROVIDER_SITE_OTHER): Payer: Self-pay

## 2017-06-16 ENCOUNTER — Ambulatory Visit (HOSPITAL_COMMUNITY): Payer: No Typology Code available for payment source | Attending: Internal Medicine

## 2017-06-16 VITALS — BP 124/72 | HR 64 | Ht 62.0 in | Wt 140.0 lb

## 2017-06-16 DIAGNOSIS — R55 Syncope and collapse: Secondary | ICD-10-CM | POA: Insufficient documentation

## 2017-06-16 DIAGNOSIS — R079 Chest pain, unspecified: Secondary | ICD-10-CM | POA: Insufficient documentation

## 2017-06-16 DIAGNOSIS — I119 Hypertensive heart disease without heart failure: Secondary | ICD-10-CM | POA: Diagnosis not present

## 2017-06-16 DIAGNOSIS — E785 Hyperlipidemia, unspecified: Secondary | ICD-10-CM | POA: Diagnosis not present

## 2017-06-16 DIAGNOSIS — I081 Rheumatic disorders of both mitral and tricuspid valves: Secondary | ICD-10-CM | POA: Insufficient documentation

## 2017-06-16 DIAGNOSIS — E119 Type 2 diabetes mellitus without complications: Secondary | ICD-10-CM | POA: Diagnosis not present

## 2017-06-16 DIAGNOSIS — R001 Bradycardia, unspecified: Secondary | ICD-10-CM | POA: Diagnosis not present

## 2017-06-16 DIAGNOSIS — I495 Sick sinus syndrome: Secondary | ICD-10-CM

## 2017-06-16 DIAGNOSIS — I480 Paroxysmal atrial fibrillation: Secondary | ICD-10-CM

## 2017-06-16 DIAGNOSIS — I6521 Occlusion and stenosis of right carotid artery: Secondary | ICD-10-CM

## 2017-06-16 DIAGNOSIS — I4891 Unspecified atrial fibrillation: Secondary | ICD-10-CM | POA: Insufficient documentation

## 2017-06-16 LAB — CUP PACEART INCLINIC DEVICE CHECK
Battery Remaining Longevity: 112 mo
Battery Voltage: 2.78 V
Brady Statistic AS VS Percent: 95 %
Implantable Lead Implant Date: 20130610
Implantable Lead Location: 753860
Implantable Lead Model: 5092
Implantable Pulse Generator Implant Date: 20130610
Lead Channel Pacing Threshold Amplitude: 0.75 V
Lead Channel Pacing Threshold Amplitude: 0.75 V
Lead Channel Pacing Threshold Pulse Width: 0.4 ms
Lead Channel Pacing Threshold Pulse Width: 0.4 ms
Lead Channel Pacing Threshold Pulse Width: 0.4 ms
Lead Channel Pacing Threshold Pulse Width: 0.4 ms
Lead Channel Sensing Intrinsic Amplitude: 4 mV
Lead Channel Setting Sensing Sensitivity: 5.6 mV
MDC IDC LEAD IMPLANT DT: 20130610
MDC IDC LEAD LOCATION: 753859
MDC IDC MSMT BATTERY IMPEDANCE: 306 Ohm
MDC IDC MSMT LEADCHNL RA IMPEDANCE VALUE: 318 Ohm
MDC IDC MSMT LEADCHNL RA PACING THRESHOLD AMPLITUDE: 0.625 V
MDC IDC MSMT LEADCHNL RV IMPEDANCE VALUE: 746 Ohm
MDC IDC MSMT LEADCHNL RV PACING THRESHOLD AMPLITUDE: 0.75 V
MDC IDC MSMT LEADCHNL RV SENSING INTR AMPL: 22.4 mV
MDC IDC SESS DTM: 20180806140540
MDC IDC SET LEADCHNL RA PACING AMPLITUDE: 2 V
MDC IDC SET LEADCHNL RV PACING AMPLITUDE: 2.5 V
MDC IDC SET LEADCHNL RV PACING PULSEWIDTH: 0.4 ms
MDC IDC STAT BRADY AP VP PERCENT: 0 %
MDC IDC STAT BRADY AP VS PERCENT: 3 %
MDC IDC STAT BRADY AS VP PERCENT: 1 %

## 2017-06-16 NOTE — Patient Instructions (Addendum)
Medication Instructions:  Your physician recommends that you continue on your current medications as directed. Please refer to the Current Medication list given to you today.   Labwork: None ordered   Testing/Procedures: None ordered   Follow-Up: Remote monitoring is used to monitor your Pacemaker from home. This monitoring reduces the number of office visits required to check your device to one time per year. It allows [place] to keep an eye on the functioning of your device to ensure it is working properly. You are scheduled for a device check from home on 09/15/17. You may send your transmission at any time that day. If you have a wireless device, the transmission will be sent automatically. After your physician reviews your transmission, you will receive a postcard with your next transmission date.  Your physician recommends that you schedule a follow-up appointment in: 6-8 weeks with Kylie Marshall, NP      Any Other Special Instructions Will Be Listed Below (If Applicable).     If you need a refill on your cardiac medications before your next appointment, please call your pharmacy.

## 2017-06-16 NOTE — Progress Notes (Signed)
PCP: Kylie Pinto, MD Primary EP:  Kylie Sanchez is a 83 y.o. female who presents today for routine electrophysiology followup. She is recovering from recent R CEA surgery.  She had perioperative afib with RVR with associated chest pain and elevated troponin.  Conservative measures were followed.  She has done reasonably well since.  Her afib is less frequent.  Denies further chest pain.  Today, she denies symptoms of shortness of breath,  lower extremity edema, dizziness, presyncope, or syncope.  The patient is otherwise without complaint today.   Past Medical History:  Diagnosis Date  . Cancer ([place]) 2015   left eye  . Cataract   . CKD (chronic kidney disease) stage 3, GFR 30-59 ml/min    due to DM  . Dizziness   . Hyperlipidemia   . Hypertension   . Hypertensive cardiovascular disease   . Impaired vision   . Paroxysmal atrial fibrillation (HCC)   . Presence of permanent cardiac pacemaker   . Retinopathy   . Sick sinus syndrome ([place])   . Type II or unspecified type diabetes mellitus without mention of complication, not stated as uncontrolled    Past Surgical History:  Procedure Laterality Date  . DENTAL SURGERY    . ENDARTERECTOMY Right 05/13/2017   Procedure: REDO ENDARTERECTOMY CAROTID Resection Redundant Internal Corotid Artery.;  Surgeon: Angelia Mould, MD;  Location: [facility];  Service: Vascular;  Laterality: Right;  . ENDARTERECTOMY Right 05/13/2017   Procedure: ENDARTERECTOMY CAROTID-RIGHT;  Surgeon: Serafina Mitchell, MD;  Location: [facility] OR;  Service: Vascular;  Laterality: Right;  . EYE SURGERY Left    tumor and eye removed  . PACEMAKER INSERTION  04/20/12   MDT Adapta L implanted by Kylie Rayann Heman  . PATCH ANGIOPLASTY Right 05/13/2017   Procedure: PATCH ANGIOPLASTY USING Rueben Bash BIOLOGIC PATCH;  Surgeon: Serafina Mitchell, MD;  Location: [facility] OR;  Service: Vascular;  Laterality: Right;  . PERMANENT PACEMAKER INSERTION N/A 04/20/2012   Procedure: PERMANENT  PACEMAKER INSERTION;  Surgeon: Thompson Grayer, MD;  [facility];  Service: Cardiovascular;  Laterality: N/A;    ROS- all systems are reviewed and negative except as per HPI above  Current Outpatient Prescriptions  Medication Sig Dispense Refill  . aspirin EC 81 MG EC tablet Take 1 tablet (81 mg total) by mouth daily. 30 tablet   . atorvastatin (LIPITOR) 40 MG tablet Take 1 tablet (40 mg total) by mouth daily at 6 PM. 30 tablet 11  . b complex vitamins tablet Take 1 tablet by mouth daily.    . Cholecalciferol (VITAMIN D PO) Take 1 tablet by mouth every evening.     . Coenzyme Q10 (CO Q 10) 100 MG CAPS Take 100 mg by mouth every evening.     . escitalopram (LEXAPRO) 5 MG tablet Take 5 mg by mouth daily.    . furosemide (LASIX) 20 MG tablet Take 1 tablet (20 mg total) by mouth daily. 30 tablet 0  . glimepiride (AMARYL) 2 MG tablet Take 1-2 mg by mouth daily with breakfast.    . Magnesium 250 MG TABS Take 250 mg by mouth every evening.     . metFORMIN (GLUCOPHAGE-XR) 500 MG 24 hr tablet Take 1 tablet (500 mg total) by mouth every morning. Take with meal for diabetes 30 tablet 11  . metoprolol succinate (TOPROL-XL) 100 MG 24 hr tablet Take 1 tablet (100 mg total) by mouth 2 (two) times daily. Take with or immediately following a  meal. 60 tablet 1  . warfarin (COUMADIN) 5 MG tablet Take 2.5-5 mg by mouth See admin instructions. Take 5 mg by mouth daily on Monday, Wednesday, Friday and take 2.5 mg by mouth on Sunday, Tuesday, Thursday, Saturday    . nitroGLYCERIN (NITROSTAT) 0.4 MG SL tablet Place 1 tablet (0.4 mg total) under the tongue every 5 (five) minutes as needed for chest pain. 100 tablet 3   No current facility-administered medications for this visit.     Physical Exam: Vitals:   06/16/17 1218  BP: 124/72  Pulse: 64  SpO2: 96%  Weight: 140 lb (63.5 kg)  Height: 5\' 2"  (1.575 m)    GEN- The patient is well appearing, alert and oriented x 3 today.   Head- normocephalic,  atraumatic Eyes-  Sclera clear, conjunctiva pink Ears- hearing intact Oropharynx- clear Lungs- Clear to ausculation bilaterally, normal work of breathing Chest- pacemaker pocket is well healed Heart- Regular rate and rhythm, no murmurs, rubs or gallops, PMI not laterally displaced GI- soft, NT, ND, + BS Extremities- no clubbing, cyanosis, or edema  Pacemaker interrogation- reviewed in detail today,  See PACEART report  Echo is reviewed today and reveals that EF has normalized.   Assessment and Plan:  1. Symptomatic sinus bradycardia  Normal pacemaker function See Pace Art report No changes today  2. Paroxysmal afib Increased in the setting of recent surgery, though AF burden appears to be slowly returning to baseline No changes today On coumadin  3. Recent acute systolic dysfunction euvolemic today Repeat echo reveals that EF has normalized No further workup is planned  Return in 6-8 weeks for follow-up with EP NP  Thompson Grayer MD, [facility] 06/16/2017 12:42 PM

## 2017-06-17 NOTE — Addendum Note (Signed)
Addended by: Lianne Cure A on: 06/17/2017 04:20 PM   Modules accepted: Orders

## 2017-06-26 ENCOUNTER — Telehealth: Payer: Self-pay | Admitting: Pharmacist

## 2017-06-26 NOTE — Telephone Encounter (Signed)
[facility] called as they are admitting the patient and she is on warfarin. She has not been seen by our anticoagulation clinic since Nov 2017, but is a patient of Dr. Rayann Heman. Per Jenny Reichmann Pt has been in skilled nursing facilities since at least June of this year. They will be going to home tomorrow. Gave verbal order to get INR and call to clinic.

## 2017-06-27 ENCOUNTER — Ambulatory Visit (INDEPENDENT_AMBULATORY_CARE_PROVIDER_SITE_OTHER): Payer: Medicare Other | Admitting: Internal Medicine

## 2017-06-27 DIAGNOSIS — I481 Persistent atrial fibrillation: Secondary | ICD-10-CM

## 2017-06-27 DIAGNOSIS — I1310 Hypertensive heart and chronic kidney disease without heart failure, with stage 1 through stage 4 chronic kidney disease, or unspecified chronic kidney disease: Secondary | ICD-10-CM | POA: Diagnosis not present

## 2017-06-27 DIAGNOSIS — I4819 Other persistent atrial fibrillation: Secondary | ICD-10-CM

## 2017-06-27 DIAGNOSIS — I480 Paroxysmal atrial fibrillation: Secondary | ICD-10-CM | POA: Diagnosis not present

## 2017-06-27 DIAGNOSIS — I69392 Facial weakness following cerebral infarction: Secondary | ICD-10-CM | POA: Diagnosis not present

## 2017-06-27 DIAGNOSIS — Z48812 Encounter for surgical aftercare following surgery on the circulatory system: Secondary | ICD-10-CM | POA: Diagnosis not present

## 2017-06-27 DIAGNOSIS — E1122 Type 2 diabetes mellitus with diabetic chronic kidney disease: Secondary | ICD-10-CM | POA: Diagnosis not present

## 2017-06-27 DIAGNOSIS — I2510 Atherosclerotic heart disease of native coronary artery without angina pectoris: Secondary | ICD-10-CM | POA: Diagnosis not present

## 2017-06-27 DIAGNOSIS — Z5181 Encounter for therapeutic drug level monitoring: Secondary | ICD-10-CM | POA: Diagnosis not present

## 2017-06-27 LAB — POCT INR: INR: 2.7

## 2017-06-30 DIAGNOSIS — E1122 Type 2 diabetes mellitus with diabetic chronic kidney disease: Secondary | ICD-10-CM | POA: Diagnosis not present

## 2017-06-30 DIAGNOSIS — Z48812 Encounter for surgical aftercare following surgery on the circulatory system: Secondary | ICD-10-CM | POA: Diagnosis not present

## 2017-06-30 DIAGNOSIS — I1310 Hypertensive heart and chronic kidney disease without heart failure, with stage 1 through stage 4 chronic kidney disease, or unspecified chronic kidney disease: Secondary | ICD-10-CM | POA: Diagnosis not present

## 2017-06-30 DIAGNOSIS — I480 Paroxysmal atrial fibrillation: Secondary | ICD-10-CM | POA: Diagnosis not present

## 2017-06-30 DIAGNOSIS — I2510 Atherosclerotic heart disease of native coronary artery without angina pectoris: Secondary | ICD-10-CM | POA: Diagnosis not present

## 2017-06-30 DIAGNOSIS — I69392 Facial weakness following cerebral infarction: Secondary | ICD-10-CM | POA: Diagnosis not present

## 2017-07-02 ENCOUNTER — Ambulatory Visit (INDEPENDENT_AMBULATORY_CARE_PROVIDER_SITE_OTHER): Payer: Self-pay | Admitting: Internal Medicine

## 2017-07-02 ENCOUNTER — Telehealth: Payer: Self-pay | Admitting: Internal Medicine

## 2017-07-02 DIAGNOSIS — E1122 Type 2 diabetes mellitus with diabetic chronic kidney disease: Secondary | ICD-10-CM | POA: Diagnosis not present

## 2017-07-02 DIAGNOSIS — Z48812 Encounter for surgical aftercare following surgery on the circulatory system: Secondary | ICD-10-CM | POA: Diagnosis not present

## 2017-07-02 DIAGNOSIS — I2510 Atherosclerotic heart disease of native coronary artery without angina pectoris: Secondary | ICD-10-CM | POA: Diagnosis not present

## 2017-07-02 DIAGNOSIS — I481 Persistent atrial fibrillation: Secondary | ICD-10-CM

## 2017-07-02 DIAGNOSIS — I1310 Hypertensive heart and chronic kidney disease without heart failure, with stage 1 through stage 4 chronic kidney disease, or unspecified chronic kidney disease: Secondary | ICD-10-CM | POA: Diagnosis not present

## 2017-07-02 DIAGNOSIS — I69392 Facial weakness following cerebral infarction: Secondary | ICD-10-CM | POA: Diagnosis not present

## 2017-07-02 DIAGNOSIS — I480 Paroxysmal atrial fibrillation: Secondary | ICD-10-CM | POA: Diagnosis not present

## 2017-07-02 DIAGNOSIS — I4819 Other persistent atrial fibrillation: Secondary | ICD-10-CM

## 2017-07-02 LAB — PROTIME-INR: INR: 5.9 — AB (ref 0.9–1.1)

## 2017-07-02 LAB — POCT INR

## 2017-07-02 NOTE — Telephone Encounter (Signed)
New message      Pt c/o medication issue:  1. Name of Medication:  coumadin  2. How are you currently taking this medication (dosage and times per day)? 5mg  daily per bottle----(but order says 4mg  daily) 3. Are you having a reaction (difficulty breathing--STAT)? no 4. What is your medication issue? Pt is having nosebleeds.  Want an order to check pt/inr while nurse is at the home and get clarification on dosage

## 2017-07-02 NOTE — Telephone Encounter (Signed)
Per Tiffany - pt having significant nose bleeding. Per their record (and ours) she was to take 4mg  daily. Tiffany reports that the bottle she has been using is 5mg  tablets and she has been taking 1- 5mg  tablet daily since our last check.  Verbal order for INR given. See anticoag note from 07/02/17 for additional details

## 2017-07-03 LAB — PROTIME-INR: INR: 5.5 — AB (ref <=1.1)

## 2017-07-04 ENCOUNTER — Encounter: Payer: Self-pay | Admitting: Pharmacist

## 2017-07-04 ENCOUNTER — Other Ambulatory Visit: Payer: Self-pay | Admitting: Pharmacist

## 2017-07-04 DIAGNOSIS — I480 Paroxysmal atrial fibrillation: Secondary | ICD-10-CM | POA: Diagnosis not present

## 2017-07-04 DIAGNOSIS — I2510 Atherosclerotic heart disease of native coronary artery without angina pectoris: Secondary | ICD-10-CM | POA: Diagnosis not present

## 2017-07-04 DIAGNOSIS — E1122 Type 2 diabetes mellitus with diabetic chronic kidney disease: Secondary | ICD-10-CM | POA: Diagnosis not present

## 2017-07-04 DIAGNOSIS — I69392 Facial weakness following cerebral infarction: Secondary | ICD-10-CM | POA: Diagnosis not present

## 2017-07-04 DIAGNOSIS — Z48812 Encounter for surgical aftercare following surgery on the circulatory system: Secondary | ICD-10-CM | POA: Diagnosis not present

## 2017-07-04 DIAGNOSIS — I1310 Hypertensive heart and chronic kidney disease without heart failure, with stage 1 through stage 4 chronic kidney disease, or unspecified chronic kidney disease: Secondary | ICD-10-CM | POA: Diagnosis not present

## 2017-07-04 MED ORDER — WARFARIN SODIUM 4 MG PO TABS: ORAL_TABLET | ORAL | 1 refills | Status: DC

## 2017-07-07 ENCOUNTER — Ambulatory Visit (INDEPENDENT_AMBULATORY_CARE_PROVIDER_SITE_OTHER): Payer: Medicare Other | Admitting: Cardiovascular Disease

## 2017-07-07 DIAGNOSIS — I1310 Hypertensive heart and chronic kidney disease without heart failure, with stage 1 through stage 4 chronic kidney disease, or unspecified chronic kidney disease: Secondary | ICD-10-CM | POA: Diagnosis not present

## 2017-07-07 DIAGNOSIS — I480 Paroxysmal atrial fibrillation: Secondary | ICD-10-CM | POA: Diagnosis not present

## 2017-07-07 DIAGNOSIS — Z48812 Encounter for surgical aftercare following surgery on the circulatory system: Secondary | ICD-10-CM | POA: Diagnosis not present

## 2017-07-07 DIAGNOSIS — I2510 Atherosclerotic heart disease of native coronary artery without angina pectoris: Secondary | ICD-10-CM | POA: Diagnosis not present

## 2017-07-07 DIAGNOSIS — E1122 Type 2 diabetes mellitus with diabetic chronic kidney disease: Secondary | ICD-10-CM | POA: Diagnosis not present

## 2017-07-07 DIAGNOSIS — I4819 Other persistent atrial fibrillation: Secondary | ICD-10-CM

## 2017-07-07 DIAGNOSIS — I481 Persistent atrial fibrillation: Secondary | ICD-10-CM

## 2017-07-07 DIAGNOSIS — I69392 Facial weakness following cerebral infarction: Secondary | ICD-10-CM | POA: Diagnosis not present

## 2017-07-07 LAB — POCT INR: INR: 1.7

## 2017-07-08 DIAGNOSIS — H35421 Microcystoid degeneration of retina, right eye: Secondary | ICD-10-CM | POA: Diagnosis not present

## 2017-07-08 DIAGNOSIS — H43811 Vitreous degeneration, right eye: Secondary | ICD-10-CM | POA: Diagnosis not present

## 2017-07-08 DIAGNOSIS — E113391 Type 2 diabetes mellitus with moderate nonproliferative diabetic retinopathy without macular edema, right eye: Secondary | ICD-10-CM | POA: Diagnosis not present

## 2017-07-08 DIAGNOSIS — H353211 Exudative age-related macular degeneration, right eye, with active choroidal neovascularization: Secondary | ICD-10-CM | POA: Diagnosis not present

## 2017-07-09 DIAGNOSIS — I69392 Facial weakness following cerebral infarction: Secondary | ICD-10-CM | POA: Diagnosis not present

## 2017-07-09 DIAGNOSIS — Z48812 Encounter for surgical aftercare following surgery on the circulatory system: Secondary | ICD-10-CM | POA: Diagnosis not present

## 2017-07-09 DIAGNOSIS — I2510 Atherosclerotic heart disease of native coronary artery without angina pectoris: Secondary | ICD-10-CM | POA: Diagnosis not present

## 2017-07-09 DIAGNOSIS — I1310 Hypertensive heart and chronic kidney disease without heart failure, with stage 1 through stage 4 chronic kidney disease, or unspecified chronic kidney disease: Secondary | ICD-10-CM | POA: Diagnosis not present

## 2017-07-09 DIAGNOSIS — E1122 Type 2 diabetes mellitus with diabetic chronic kidney disease: Secondary | ICD-10-CM | POA: Diagnosis not present

## 2017-07-09 DIAGNOSIS — I480 Paroxysmal atrial fibrillation: Secondary | ICD-10-CM | POA: Diagnosis not present

## 2017-07-10 DIAGNOSIS — I69392 Facial weakness following cerebral infarction: Secondary | ICD-10-CM | POA: Diagnosis not present

## 2017-07-10 DIAGNOSIS — Z48812 Encounter for surgical aftercare following surgery on the circulatory system: Secondary | ICD-10-CM | POA: Diagnosis not present

## 2017-07-10 DIAGNOSIS — I1310 Hypertensive heart and chronic kidney disease without heart failure, with stage 1 through stage 4 chronic kidney disease, or unspecified chronic kidney disease: Secondary | ICD-10-CM | POA: Diagnosis not present

## 2017-07-10 DIAGNOSIS — I480 Paroxysmal atrial fibrillation: Secondary | ICD-10-CM | POA: Diagnosis not present

## 2017-07-10 DIAGNOSIS — I2510 Atherosclerotic heart disease of native coronary artery without angina pectoris: Secondary | ICD-10-CM | POA: Diagnosis not present

## 2017-07-10 DIAGNOSIS — E1122 Type 2 diabetes mellitus with diabetic chronic kidney disease: Secondary | ICD-10-CM | POA: Diagnosis not present

## 2017-07-11 DIAGNOSIS — Z48812 Encounter for surgical aftercare following surgery on the circulatory system: Secondary | ICD-10-CM | POA: Diagnosis not present

## 2017-07-11 DIAGNOSIS — I480 Paroxysmal atrial fibrillation: Secondary | ICD-10-CM | POA: Diagnosis not present

## 2017-07-11 DIAGNOSIS — I2510 Atherosclerotic heart disease of native coronary artery without angina pectoris: Secondary | ICD-10-CM | POA: Diagnosis not present

## 2017-07-11 DIAGNOSIS — E1122 Type 2 diabetes mellitus with diabetic chronic kidney disease: Secondary | ICD-10-CM | POA: Diagnosis not present

## 2017-07-11 DIAGNOSIS — I1310 Hypertensive heart and chronic kidney disease without heart failure, with stage 1 through stage 4 chronic kidney disease, or unspecified chronic kidney disease: Secondary | ICD-10-CM | POA: Diagnosis not present

## 2017-07-11 DIAGNOSIS — I69392 Facial weakness following cerebral infarction: Secondary | ICD-10-CM | POA: Diagnosis not present

## 2017-07-14 DIAGNOSIS — I480 Paroxysmal atrial fibrillation: Secondary | ICD-10-CM | POA: Diagnosis not present

## 2017-07-14 DIAGNOSIS — E1122 Type 2 diabetes mellitus with diabetic chronic kidney disease: Secondary | ICD-10-CM | POA: Diagnosis not present

## 2017-07-14 DIAGNOSIS — I69392 Facial weakness following cerebral infarction: Secondary | ICD-10-CM | POA: Diagnosis not present

## 2017-07-14 DIAGNOSIS — I2510 Atherosclerotic heart disease of native coronary artery without angina pectoris: Secondary | ICD-10-CM | POA: Diagnosis not present

## 2017-07-14 DIAGNOSIS — Z48812 Encounter for surgical aftercare following surgery on the circulatory system: Secondary | ICD-10-CM | POA: Diagnosis not present

## 2017-07-14 DIAGNOSIS — I1310 Hypertensive heart and chronic kidney disease without heart failure, with stage 1 through stage 4 chronic kidney disease, or unspecified chronic kidney disease: Secondary | ICD-10-CM | POA: Diagnosis not present

## 2017-07-15 ENCOUNTER — Ambulatory Visit (INDEPENDENT_AMBULATORY_CARE_PROVIDER_SITE_OTHER): Payer: Medicare Other | Admitting: Cardiology

## 2017-07-15 DIAGNOSIS — I4819 Other persistent atrial fibrillation: Secondary | ICD-10-CM

## 2017-07-15 DIAGNOSIS — I481 Persistent atrial fibrillation: Secondary | ICD-10-CM

## 2017-07-15 DIAGNOSIS — Z5181 Encounter for therapeutic drug level monitoring: Secondary | ICD-10-CM

## 2017-07-15 DIAGNOSIS — I2510 Atherosclerotic heart disease of native coronary artery without angina pectoris: Secondary | ICD-10-CM | POA: Diagnosis not present

## 2017-07-15 DIAGNOSIS — I480 Paroxysmal atrial fibrillation: Secondary | ICD-10-CM | POA: Diagnosis not present

## 2017-07-15 DIAGNOSIS — I1310 Hypertensive heart and chronic kidney disease without heart failure, with stage 1 through stage 4 chronic kidney disease, or unspecified chronic kidney disease: Secondary | ICD-10-CM | POA: Diagnosis not present

## 2017-07-15 DIAGNOSIS — I69392 Facial weakness following cerebral infarction: Secondary | ICD-10-CM | POA: Diagnosis not present

## 2017-07-15 DIAGNOSIS — E1122 Type 2 diabetes mellitus with diabetic chronic kidney disease: Secondary | ICD-10-CM | POA: Diagnosis not present

## 2017-07-15 DIAGNOSIS — Z48812 Encounter for surgical aftercare following surgery on the circulatory system: Secondary | ICD-10-CM | POA: Diagnosis not present

## 2017-07-15 LAB — POCT INR: INR: 4.1

## 2017-07-16 DIAGNOSIS — I2510 Atherosclerotic heart disease of native coronary artery without angina pectoris: Secondary | ICD-10-CM | POA: Diagnosis not present

## 2017-07-16 DIAGNOSIS — I69392 Facial weakness following cerebral infarction: Secondary | ICD-10-CM | POA: Diagnosis not present

## 2017-07-16 DIAGNOSIS — I1310 Hypertensive heart and chronic kidney disease without heart failure, with stage 1 through stage 4 chronic kidney disease, or unspecified chronic kidney disease: Secondary | ICD-10-CM | POA: Diagnosis not present

## 2017-07-16 DIAGNOSIS — I480 Paroxysmal atrial fibrillation: Secondary | ICD-10-CM | POA: Diagnosis not present

## 2017-07-16 DIAGNOSIS — Z48812 Encounter for surgical aftercare following surgery on the circulatory system: Secondary | ICD-10-CM | POA: Diagnosis not present

## 2017-07-16 DIAGNOSIS — E1122 Type 2 diabetes mellitus with diabetic chronic kidney disease: Secondary | ICD-10-CM | POA: Diagnosis not present

## 2017-07-17 DIAGNOSIS — I69392 Facial weakness following cerebral infarction: Secondary | ICD-10-CM | POA: Diagnosis not present

## 2017-07-17 DIAGNOSIS — I1310 Hypertensive heart and chronic kidney disease without heart failure, with stage 1 through stage 4 chronic kidney disease, or unspecified chronic kidney disease: Secondary | ICD-10-CM | POA: Diagnosis not present

## 2017-07-17 DIAGNOSIS — Z48812 Encounter for surgical aftercare following surgery on the circulatory system: Secondary | ICD-10-CM | POA: Diagnosis not present

## 2017-07-17 DIAGNOSIS — I480 Paroxysmal atrial fibrillation: Secondary | ICD-10-CM | POA: Diagnosis not present

## 2017-07-17 DIAGNOSIS — I2510 Atherosclerotic heart disease of native coronary artery without angina pectoris: Secondary | ICD-10-CM | POA: Diagnosis not present

## 2017-07-17 DIAGNOSIS — E1122 Type 2 diabetes mellitus with diabetic chronic kidney disease: Secondary | ICD-10-CM | POA: Diagnosis not present

## 2017-07-18 DIAGNOSIS — I1310 Hypertensive heart and chronic kidney disease without heart failure, with stage 1 through stage 4 chronic kidney disease, or unspecified chronic kidney disease: Secondary | ICD-10-CM | POA: Diagnosis not present

## 2017-07-18 DIAGNOSIS — Z48812 Encounter for surgical aftercare following surgery on the circulatory system: Secondary | ICD-10-CM | POA: Diagnosis not present

## 2017-07-18 DIAGNOSIS — I69392 Facial weakness following cerebral infarction: Secondary | ICD-10-CM | POA: Diagnosis not present

## 2017-07-18 DIAGNOSIS — I480 Paroxysmal atrial fibrillation: Secondary | ICD-10-CM | POA: Diagnosis not present

## 2017-07-18 DIAGNOSIS — E1122 Type 2 diabetes mellitus with diabetic chronic kidney disease: Secondary | ICD-10-CM | POA: Diagnosis not present

## 2017-07-18 DIAGNOSIS — I2510 Atherosclerotic heart disease of native coronary artery without angina pectoris: Secondary | ICD-10-CM | POA: Diagnosis not present

## 2017-07-21 ENCOUNTER — Ambulatory Visit (INDEPENDENT_AMBULATORY_CARE_PROVIDER_SITE_OTHER): Payer: Medicare Other | Admitting: Cardiology

## 2017-07-21 DIAGNOSIS — I4819 Other persistent atrial fibrillation: Secondary | ICD-10-CM

## 2017-07-21 DIAGNOSIS — Z48812 Encounter for surgical aftercare following surgery on the circulatory system: Secondary | ICD-10-CM | POA: Diagnosis not present

## 2017-07-21 DIAGNOSIS — Z5181 Encounter for therapeutic drug level monitoring: Secondary | ICD-10-CM

## 2017-07-21 DIAGNOSIS — I1310 Hypertensive heart and chronic kidney disease without heart failure, with stage 1 through stage 4 chronic kidney disease, or unspecified chronic kidney disease: Secondary | ICD-10-CM | POA: Diagnosis not present

## 2017-07-21 DIAGNOSIS — I480 Paroxysmal atrial fibrillation: Secondary | ICD-10-CM | POA: Diagnosis not present

## 2017-07-21 DIAGNOSIS — I2510 Atherosclerotic heart disease of native coronary artery without angina pectoris: Secondary | ICD-10-CM | POA: Diagnosis not present

## 2017-07-21 DIAGNOSIS — I481 Persistent atrial fibrillation: Secondary | ICD-10-CM

## 2017-07-21 DIAGNOSIS — I69392 Facial weakness following cerebral infarction: Secondary | ICD-10-CM | POA: Diagnosis not present

## 2017-07-21 DIAGNOSIS — E1122 Type 2 diabetes mellitus with diabetic chronic kidney disease: Secondary | ICD-10-CM | POA: Diagnosis not present

## 2017-07-21 LAB — POCT INR: INR: 2.4

## 2017-07-23 DIAGNOSIS — I2510 Atherosclerotic heart disease of native coronary artery without angina pectoris: Secondary | ICD-10-CM | POA: Diagnosis not present

## 2017-07-23 DIAGNOSIS — Z48812 Encounter for surgical aftercare following surgery on the circulatory system: Secondary | ICD-10-CM | POA: Diagnosis not present

## 2017-07-23 DIAGNOSIS — I69392 Facial weakness following cerebral infarction: Secondary | ICD-10-CM | POA: Diagnosis not present

## 2017-07-23 DIAGNOSIS — I1310 Hypertensive heart and chronic kidney disease without heart failure, with stage 1 through stage 4 chronic kidney disease, or unspecified chronic kidney disease: Secondary | ICD-10-CM | POA: Diagnosis not present

## 2017-07-23 DIAGNOSIS — E1122 Type 2 diabetes mellitus with diabetic chronic kidney disease: Secondary | ICD-10-CM | POA: Diagnosis not present

## 2017-07-23 DIAGNOSIS — I480 Paroxysmal atrial fibrillation: Secondary | ICD-10-CM | POA: Diagnosis not present

## 2017-07-24 ENCOUNTER — Other Ambulatory Visit: Payer: Self-pay | Admitting: Internal Medicine

## 2017-07-24 ENCOUNTER — Encounter: Payer: Medicare Other | Admitting: Internal Medicine

## 2017-07-24 DIAGNOSIS — I480 Paroxysmal atrial fibrillation: Secondary | ICD-10-CM | POA: Diagnosis not present

## 2017-07-24 DIAGNOSIS — Z48812 Encounter for surgical aftercare following surgery on the circulatory system: Secondary | ICD-10-CM | POA: Diagnosis not present

## 2017-07-24 DIAGNOSIS — I69392 Facial weakness following cerebral infarction: Secondary | ICD-10-CM | POA: Diagnosis not present

## 2017-07-24 DIAGNOSIS — I2510 Atherosclerotic heart disease of native coronary artery without angina pectoris: Secondary | ICD-10-CM | POA: Diagnosis not present

## 2017-07-24 DIAGNOSIS — I1310 Hypertensive heart and chronic kidney disease without heart failure, with stage 1 through stage 4 chronic kidney disease, or unspecified chronic kidney disease: Secondary | ICD-10-CM | POA: Diagnosis not present

## 2017-07-24 DIAGNOSIS — E1122 Type 2 diabetes mellitus with diabetic chronic kidney disease: Secondary | ICD-10-CM | POA: Diagnosis not present

## 2017-07-28 ENCOUNTER — Ambulatory Visit (INDEPENDENT_AMBULATORY_CARE_PROVIDER_SITE_OTHER): Payer: Medicare Other

## 2017-07-28 DIAGNOSIS — Z48812 Encounter for surgical aftercare following surgery on the circulatory system: Secondary | ICD-10-CM | POA: Diagnosis not present

## 2017-07-28 DIAGNOSIS — I481 Persistent atrial fibrillation: Secondary | ICD-10-CM | POA: Diagnosis not present

## 2017-07-28 DIAGNOSIS — Z5181 Encounter for therapeutic drug level monitoring: Secondary | ICD-10-CM

## 2017-07-28 DIAGNOSIS — I1310 Hypertensive heart and chronic kidney disease without heart failure, with stage 1 through stage 4 chronic kidney disease, or unspecified chronic kidney disease: Secondary | ICD-10-CM | POA: Diagnosis not present

## 2017-07-28 DIAGNOSIS — I69392 Facial weakness following cerebral infarction: Secondary | ICD-10-CM | POA: Diagnosis not present

## 2017-07-28 DIAGNOSIS — E1122 Type 2 diabetes mellitus with diabetic chronic kidney disease: Secondary | ICD-10-CM | POA: Diagnosis not present

## 2017-07-28 DIAGNOSIS — I480 Paroxysmal atrial fibrillation: Secondary | ICD-10-CM | POA: Diagnosis not present

## 2017-07-28 DIAGNOSIS — I2510 Atherosclerotic heart disease of native coronary artery without angina pectoris: Secondary | ICD-10-CM | POA: Diagnosis not present

## 2017-07-28 DIAGNOSIS — I4819 Other persistent atrial fibrillation: Secondary | ICD-10-CM

## 2017-07-28 LAB — POCT INR: INR: 2.4

## 2017-07-30 DIAGNOSIS — E1122 Type 2 diabetes mellitus with diabetic chronic kidney disease: Secondary | ICD-10-CM | POA: Diagnosis not present

## 2017-07-30 DIAGNOSIS — I2510 Atherosclerotic heart disease of native coronary artery without angina pectoris: Secondary | ICD-10-CM | POA: Diagnosis not present

## 2017-07-30 DIAGNOSIS — I1310 Hypertensive heart and chronic kidney disease without heart failure, with stage 1 through stage 4 chronic kidney disease, or unspecified chronic kidney disease: Secondary | ICD-10-CM | POA: Diagnosis not present

## 2017-07-30 DIAGNOSIS — Z48812 Encounter for surgical aftercare following surgery on the circulatory system: Secondary | ICD-10-CM | POA: Diagnosis not present

## 2017-07-30 DIAGNOSIS — I69392 Facial weakness following cerebral infarction: Secondary | ICD-10-CM | POA: Diagnosis not present

## 2017-07-30 DIAGNOSIS — I480 Paroxysmal atrial fibrillation: Secondary | ICD-10-CM | POA: Diagnosis not present

## 2017-07-31 DIAGNOSIS — I69392 Facial weakness following cerebral infarction: Secondary | ICD-10-CM | POA: Diagnosis not present

## 2017-07-31 DIAGNOSIS — I1310 Hypertensive heart and chronic kidney disease without heart failure, with stage 1 through stage 4 chronic kidney disease, or unspecified chronic kidney disease: Secondary | ICD-10-CM | POA: Diagnosis not present

## 2017-07-31 DIAGNOSIS — E1122 Type 2 diabetes mellitus with diabetic chronic kidney disease: Secondary | ICD-10-CM | POA: Diagnosis not present

## 2017-07-31 DIAGNOSIS — I2510 Atherosclerotic heart disease of native coronary artery without angina pectoris: Secondary | ICD-10-CM | POA: Diagnosis not present

## 2017-07-31 DIAGNOSIS — I480 Paroxysmal atrial fibrillation: Secondary | ICD-10-CM | POA: Diagnosis not present

## 2017-07-31 DIAGNOSIS — Z48812 Encounter for surgical aftercare following surgery on the circulatory system: Secondary | ICD-10-CM | POA: Diagnosis not present

## 2017-08-01 NOTE — Progress Notes (Signed)
Electrophysiology Office Note Date: 08/06/2017  ID:  Kylie Sanchez, DOB 08/27/1925, MRN [id number]  PCP: Unk Pinto, MD Electrophysiologist: Rayann Heman  CC: Pacemaker follow-up  Kylie Sanchez is a 83 y.o. female seen today for Dr Rayann Heman.  She presents today for routine electrophysiology followup.  Since last being seen in our clinic, the patient reports doing reasonably well.  She is seen with her caregiver today. She has not had awareness of AF recently.  She denies chest pain, palpitations, dyspnea, PND, orthopnea, nausea, vomiting, dizziness, syncope, edema, weight gain, or early satiety.  Device History: MDT dual chamber PPM implanted 2013 for sinus bradycardia   Past Medical History:  Diagnosis Date  . Cancer ([place]) 2015   left eye  . Cataract   . CKD (chronic kidney disease) stage 3, GFR 30-59 ml/min    due to DM  . Dizziness   . Hyperlipidemia   . Hypertension   . Hypertensive cardiovascular disease   . Impaired vision   . Paroxysmal atrial fibrillation (HCC)   . Presence of permanent cardiac pacemaker   . Retinopathy   . Sick sinus syndrome ([place])   . Type II or unspecified type diabetes mellitus without mention of complication, not stated as uncontrolled    Past Surgical History:  Procedure Laterality Date  . DENTAL SURGERY    . ENDARTERECTOMY Right 05/13/2017   Procedure: REDO ENDARTERECTOMY CAROTID Resection Redundant Internal Corotid Artery.;  Surgeon: Angelia Mould, MD;  Location: [facility];  Service: Vascular;  Laterality: Right;  . ENDARTERECTOMY Right 05/13/2017   Procedure: ENDARTERECTOMY CAROTID-RIGHT;  Surgeon: Serafina Mitchell, MD;  Location: [facility] OR;  Service: Vascular;  Laterality: Right;  . EYE SURGERY Left    tumor and eye removed  . PACEMAKER INSERTION  04/20/12   MDT Adapta L implanted by Dr Rayann Heman  . PATCH ANGIOPLASTY Right 05/13/2017   Procedure: PATCH ANGIOPLASTY USING Rueben Bash BIOLOGIC PATCH;  Surgeon: Serafina Mitchell, MD;  Location: [facility] OR;   Service: Vascular;  Laterality: Right;  . PERMANENT PACEMAKER INSERTION N/A 04/20/2012   Procedure: PERMANENT PACEMAKER INSERTION;  Surgeon: Thompson Grayer, MD;  Location: [facility] CATH LAB;  Service: Cardiovascular;  Laterality: N/A;    Current Outpatient Prescriptions  Medication Sig Dispense Refill  . aspirin EC 81 MG EC tablet Take 1 tablet (81 mg total) by mouth daily. 30 tablet   . atorvastatin (LIPITOR) 40 MG tablet Take 1 tablet (40 mg total) by mouth daily at 6 PM. 30 tablet 11  . b complex vitamins tablet Take 1 tablet by mouth daily.    . Cholecalciferol (VITAMIN D PO) Take 1 tablet by mouth every evening.     . Coenzyme Q10 (CO Q 10) 100 MG CAPS Take 100 mg by mouth every evening.     . escitalopram (LEXAPRO) 5 MG tablet TAKE ONE (1) TABLET BY MOUTH EVERY DAY 30 tablet 1  . furosemide (LASIX) 20 MG tablet Take 1 tablet (20 mg total) by mouth daily. 30 tablet 0  . glimepiride (AMARYL) 2 MG tablet Take 1-2 mg by mouth daily with breakfast.    . Magnesium 250 MG TABS Take 250 mg by mouth every evening.     . metFORMIN (GLUCOPHAGE-XR) 500 MG 24 hr tablet Take 1 tablet (500 mg total) by mouth every morning. Take with meal for diabetes 30 tablet 11  . metoprolol succinate (TOPROL-XL) 100 MG 24 hr tablet Take 1 tablet (100 mg total) by mouth 2 (two) times daily. Take with or  immediately following a meal. 60 tablet 1  . nitroGLYCERIN (NITROSTAT) 0.4 MG SL tablet Place 1 tablet (0.4 mg total) under the tongue every 5 (five) minutes as needed for chest pain. 100 tablet 3  . warfarin (COUMADIN) 4 MG tablet Take 1 tablet by mouth daily as directed by Coumadin clinic 30 tablet 1   No current facility-administered medications for this visit.     Allergies:   No known allergies   Social History: Social History   Social History  . Marital status: Single    Spouse name: N/A  . Number of children: N/A  . Years of education: N/A   Occupational History  . Not on file.   Social History Main  Topics  . Smoking status: Never Smoker  . Smokeless tobacco: Never Used  . Alcohol use No  . Drug use: No  . Sexual activity: Not on file   Other Topics Concern  . Not on file   Social History Narrative  . No narrative on file    Family History: Family History  Problem Relation Age of Onset  . Heart attack Father   . Heart disease Father   . Hypertension Mother   . Heart disease Mother   . Cancer Other      Review of Systems: All other systems reviewed and are otherwise negative except as noted above.   Physical Exam: VS:  BP (!) 118/58   Pulse 60   Ht 5\' 2"  (1.575 m)   Wt 135 lb (61.2 kg)   SpO2 96%   BMI 24.69 kg/m  , BMI Body mass index is 24.69 kg/m.  GEN- The patient is elderly appearing, alert and oriented x 3 today.   HEENT: normocephalic, atraumatic; sclera clear, conjunctiva red, left eye enucliated; hearing intact; oropharynx clear; neck supple  Lungs- Clear to ausculation bilaterally, normal work of breathing.  No wheezes, rales, rhonchi Heart- Regular rate and rhythm, no murmurs, rubs or gallops  GI- soft, non-tender, non-distended, bowel sounds present  Extremities- no clubbing, cyanosis, or edema  MS- no significant deformity or atrophy Skin- warm and dry, no rash or lesion; PPM pocket well healed Psych- euthymic mood, full affect Neuro- strength and sensation are intact  PPM Interrogation- reviewed in detail today,  See PACEART report  EKG:  EKG is not ordered today.  Recent Labs: 04/09/2017: Magnesium 2.0; TSH 1.68 05/07/2017: ALT 16 05/21/2017: BUN 31; Creatinine, Ser 1.28; Potassium 4.6; Sodium 139 05/26/2017: Hemoglobin 10.7; Platelets 328   Wt Readings from Last 3 Encounters:  08/06/17 135 lb (61.2 kg)  06/16/17 140 lb (63.5 kg)  06/09/17 138 lb 12.8 oz (63 kg)     Other studies Reviewed: Additional studies/ records that were reviewed today include: Dr Jackalyn Lombard office notes  Assessment and Plan:  1.  Symptomatic  bradycardia Normal PPM function See Pace Art report No changes today  2.  Persistent atrial fibrillation Burden by device interrogation 19.9% Continue Warfarin long term for CHADS2VASC of 5 Pt asymptomatic   3.  Tachycardia induced cardiomyopathy EF normalized by recent echo Continue medical therapy    Current medicines are reviewed at length with the patient today.   The patient does not have concerns regarding her medicines.  The following changes were made today:  None   Labs/ tests ordered today include: none No orders of the defined types were placed in this encounter.    Disposition:   Follow up with me in 6 months    Signed, Chanetta Marshall,  NP 08/06/2017 9:32 AM  [facility] [address] [place] [address] [phone] (office) [phone] (fax)

## 2017-08-04 DIAGNOSIS — I69392 Facial weakness following cerebral infarction: Secondary | ICD-10-CM | POA: Diagnosis not present

## 2017-08-04 DIAGNOSIS — I480 Paroxysmal atrial fibrillation: Secondary | ICD-10-CM | POA: Diagnosis not present

## 2017-08-04 DIAGNOSIS — Z48812 Encounter for surgical aftercare following surgery on the circulatory system: Secondary | ICD-10-CM | POA: Diagnosis not present

## 2017-08-04 DIAGNOSIS — I1310 Hypertensive heart and chronic kidney disease without heart failure, with stage 1 through stage 4 chronic kidney disease, or unspecified chronic kidney disease: Secondary | ICD-10-CM | POA: Diagnosis not present

## 2017-08-04 DIAGNOSIS — I2510 Atherosclerotic heart disease of native coronary artery without angina pectoris: Secondary | ICD-10-CM | POA: Diagnosis not present

## 2017-08-04 DIAGNOSIS — E1122 Type 2 diabetes mellitus with diabetic chronic kidney disease: Secondary | ICD-10-CM | POA: Diagnosis not present

## 2017-08-05 DIAGNOSIS — E113391 Type 2 diabetes mellitus with moderate nonproliferative diabetic retinopathy without macular edema, right eye: Secondary | ICD-10-CM | POA: Diagnosis not present

## 2017-08-05 DIAGNOSIS — Z48812 Encounter for surgical aftercare following surgery on the circulatory system: Secondary | ICD-10-CM | POA: Diagnosis not present

## 2017-08-05 DIAGNOSIS — H353211 Exudative age-related macular degeneration, right eye, with active choroidal neovascularization: Secondary | ICD-10-CM | POA: Diagnosis not present

## 2017-08-05 DIAGNOSIS — H43811 Vitreous degeneration, right eye: Secondary | ICD-10-CM | POA: Diagnosis not present

## 2017-08-05 DIAGNOSIS — I69392 Facial weakness following cerebral infarction: Secondary | ICD-10-CM | POA: Diagnosis not present

## 2017-08-05 DIAGNOSIS — I480 Paroxysmal atrial fibrillation: Secondary | ICD-10-CM | POA: Diagnosis not present

## 2017-08-05 DIAGNOSIS — E1122 Type 2 diabetes mellitus with diabetic chronic kidney disease: Secondary | ICD-10-CM | POA: Diagnosis not present

## 2017-08-05 DIAGNOSIS — I1310 Hypertensive heart and chronic kidney disease without heart failure, with stage 1 through stage 4 chronic kidney disease, or unspecified chronic kidney disease: Secondary | ICD-10-CM | POA: Diagnosis not present

## 2017-08-05 DIAGNOSIS — I2510 Atherosclerotic heart disease of native coronary artery without angina pectoris: Secondary | ICD-10-CM | POA: Diagnosis not present

## 2017-08-06 ENCOUNTER — Encounter: Payer: Self-pay | Admitting: Nurse Practitioner

## 2017-08-06 ENCOUNTER — Ambulatory Visit (INDEPENDENT_AMBULATORY_CARE_PROVIDER_SITE_OTHER): Payer: Medicare Other | Admitting: Nurse Practitioner

## 2017-08-06 VITALS — BP 118/58 | HR 60 | Ht 62.0 in | Wt 135.0 lb

## 2017-08-06 DIAGNOSIS — I481 Persistent atrial fibrillation: Secondary | ICD-10-CM | POA: Diagnosis not present

## 2017-08-06 DIAGNOSIS — I6521 Occlusion and stenosis of right carotid artery: Secondary | ICD-10-CM | POA: Diagnosis not present

## 2017-08-06 DIAGNOSIS — I495 Sick sinus syndrome: Secondary | ICD-10-CM

## 2017-08-06 DIAGNOSIS — I4819 Other persistent atrial fibrillation: Secondary | ICD-10-CM

## 2017-08-06 NOTE — Patient Instructions (Addendum)
Medication Instructions:   Your physician recommends that you continue on your current medications as directed. Please refer to the Current Medication list given to you today.   If you need a refill on your cardiac medications before your next appointment, please call your pharmacy.  Labwork:  NONE ORDERED  TODAY   Testing/Procedures: NONE ORDERED  TODAY    Follow-Up: Your physician wants you to follow-up in:  [place] will receive a reminder letter in the mail two months in advance. If you don't receive a letter, please call our office to schedule the follow-up appointment.   Remote monitoring is used to monitor your Pacemaker of ICD from home. This monitoring reduces the number of office visits required to check your device to one time per year. It allows [place] to keep an eye on the functioning of your device to ensure it is working properly. You are scheduled for a device check from home on . 09-15-17  You may send your transmission at any time that day. If you have a wireless device, the transmission will be sent automatically. After your physician reviews your transmission, you will receive a postcard with your next transmission date.       Any Other Special Instructions Will Be Listed Below (If Applicable).

## 2017-08-07 ENCOUNTER — Ambulatory Visit (INDEPENDENT_AMBULATORY_CARE_PROVIDER_SITE_OTHER): Payer: Medicare Other | Admitting: Interventional Cardiology

## 2017-08-07 DIAGNOSIS — I481 Persistent atrial fibrillation: Secondary | ICD-10-CM | POA: Diagnosis not present

## 2017-08-07 DIAGNOSIS — I2510 Atherosclerotic heart disease of native coronary artery without angina pectoris: Secondary | ICD-10-CM | POA: Diagnosis not present

## 2017-08-07 DIAGNOSIS — I4819 Other persistent atrial fibrillation: Secondary | ICD-10-CM

## 2017-08-07 DIAGNOSIS — Z48812 Encounter for surgical aftercare following surgery on the circulatory system: Secondary | ICD-10-CM | POA: Diagnosis not present

## 2017-08-07 DIAGNOSIS — E1122 Type 2 diabetes mellitus with diabetic chronic kidney disease: Secondary | ICD-10-CM | POA: Diagnosis not present

## 2017-08-07 DIAGNOSIS — Z5181 Encounter for therapeutic drug level monitoring: Secondary | ICD-10-CM

## 2017-08-07 DIAGNOSIS — I69392 Facial weakness following cerebral infarction: Secondary | ICD-10-CM | POA: Diagnosis not present

## 2017-08-07 DIAGNOSIS — I480 Paroxysmal atrial fibrillation: Secondary | ICD-10-CM | POA: Diagnosis not present

## 2017-08-07 DIAGNOSIS — I1310 Hypertensive heart and chronic kidney disease without heart failure, with stage 1 through stage 4 chronic kidney disease, or unspecified chronic kidney disease: Secondary | ICD-10-CM | POA: Diagnosis not present

## 2017-08-07 LAB — POCT INR: INR: 2.5

## 2017-08-08 DIAGNOSIS — I2510 Atherosclerotic heart disease of native coronary artery without angina pectoris: Secondary | ICD-10-CM | POA: Diagnosis not present

## 2017-08-08 DIAGNOSIS — I480 Paroxysmal atrial fibrillation: Secondary | ICD-10-CM | POA: Diagnosis not present

## 2017-08-08 DIAGNOSIS — E1122 Type 2 diabetes mellitus with diabetic chronic kidney disease: Secondary | ICD-10-CM | POA: Diagnosis not present

## 2017-08-08 DIAGNOSIS — Z48812 Encounter for surgical aftercare following surgery on the circulatory system: Secondary | ICD-10-CM | POA: Diagnosis not present

## 2017-08-08 DIAGNOSIS — I1310 Hypertensive heart and chronic kidney disease without heart failure, with stage 1 through stage 4 chronic kidney disease, or unspecified chronic kidney disease: Secondary | ICD-10-CM | POA: Diagnosis not present

## 2017-08-08 DIAGNOSIS — I69392 Facial weakness following cerebral infarction: Secondary | ICD-10-CM | POA: Diagnosis not present

## 2017-08-11 DIAGNOSIS — I69392 Facial weakness following cerebral infarction: Secondary | ICD-10-CM | POA: Diagnosis not present

## 2017-08-11 DIAGNOSIS — I1310 Hypertensive heart and chronic kidney disease without heart failure, with stage 1 through stage 4 chronic kidney disease, or unspecified chronic kidney disease: Secondary | ICD-10-CM | POA: Diagnosis not present

## 2017-08-11 DIAGNOSIS — I480 Paroxysmal atrial fibrillation: Secondary | ICD-10-CM | POA: Diagnosis not present

## 2017-08-11 DIAGNOSIS — E1122 Type 2 diabetes mellitus with diabetic chronic kidney disease: Secondary | ICD-10-CM | POA: Diagnosis not present

## 2017-08-11 DIAGNOSIS — Z48812 Encounter for surgical aftercare following surgery on the circulatory system: Secondary | ICD-10-CM | POA: Diagnosis not present

## 2017-08-11 DIAGNOSIS — I2510 Atherosclerotic heart disease of native coronary artery without angina pectoris: Secondary | ICD-10-CM | POA: Diagnosis not present

## 2017-08-12 DIAGNOSIS — I69392 Facial weakness following cerebral infarction: Secondary | ICD-10-CM | POA: Diagnosis not present

## 2017-08-12 DIAGNOSIS — I480 Paroxysmal atrial fibrillation: Secondary | ICD-10-CM | POA: Diagnosis not present

## 2017-08-12 DIAGNOSIS — Z48812 Encounter for surgical aftercare following surgery on the circulatory system: Secondary | ICD-10-CM | POA: Diagnosis not present

## 2017-08-12 DIAGNOSIS — I2510 Atherosclerotic heart disease of native coronary artery without angina pectoris: Secondary | ICD-10-CM | POA: Diagnosis not present

## 2017-08-12 DIAGNOSIS — E1122 Type 2 diabetes mellitus with diabetic chronic kidney disease: Secondary | ICD-10-CM | POA: Diagnosis not present

## 2017-08-12 DIAGNOSIS — I1310 Hypertensive heart and chronic kidney disease without heart failure, with stage 1 through stage 4 chronic kidney disease, or unspecified chronic kidney disease: Secondary | ICD-10-CM | POA: Diagnosis not present

## 2017-08-13 DIAGNOSIS — E1122 Type 2 diabetes mellitus with diabetic chronic kidney disease: Secondary | ICD-10-CM | POA: Diagnosis not present

## 2017-08-13 DIAGNOSIS — I480 Paroxysmal atrial fibrillation: Secondary | ICD-10-CM | POA: Diagnosis not present

## 2017-08-13 DIAGNOSIS — I1310 Hypertensive heart and chronic kidney disease without heart failure, with stage 1 through stage 4 chronic kidney disease, or unspecified chronic kidney disease: Secondary | ICD-10-CM | POA: Diagnosis not present

## 2017-08-13 DIAGNOSIS — I69392 Facial weakness following cerebral infarction: Secondary | ICD-10-CM | POA: Diagnosis not present

## 2017-08-13 DIAGNOSIS — I2510 Atherosclerotic heart disease of native coronary artery without angina pectoris: Secondary | ICD-10-CM | POA: Diagnosis not present

## 2017-08-13 DIAGNOSIS — Z48812 Encounter for surgical aftercare following surgery on the circulatory system: Secondary | ICD-10-CM | POA: Diagnosis not present

## 2017-08-14 DIAGNOSIS — E1122 Type 2 diabetes mellitus with diabetic chronic kidney disease: Secondary | ICD-10-CM | POA: Diagnosis not present

## 2017-08-14 DIAGNOSIS — I2510 Atherosclerotic heart disease of native coronary artery without angina pectoris: Secondary | ICD-10-CM | POA: Diagnosis not present

## 2017-08-14 DIAGNOSIS — I1310 Hypertensive heart and chronic kidney disease without heart failure, with stage 1 through stage 4 chronic kidney disease, or unspecified chronic kidney disease: Secondary | ICD-10-CM | POA: Diagnosis not present

## 2017-08-14 DIAGNOSIS — Z48812 Encounter for surgical aftercare following surgery on the circulatory system: Secondary | ICD-10-CM | POA: Diagnosis not present

## 2017-08-14 DIAGNOSIS — I480 Paroxysmal atrial fibrillation: Secondary | ICD-10-CM | POA: Diagnosis not present

## 2017-08-14 DIAGNOSIS — I69392 Facial weakness following cerebral infarction: Secondary | ICD-10-CM | POA: Diagnosis not present

## 2017-08-18 DIAGNOSIS — E1122 Type 2 diabetes mellitus with diabetic chronic kidney disease: Secondary | ICD-10-CM | POA: Diagnosis not present

## 2017-08-18 DIAGNOSIS — I480 Paroxysmal atrial fibrillation: Secondary | ICD-10-CM | POA: Diagnosis not present

## 2017-08-18 DIAGNOSIS — I2510 Atherosclerotic heart disease of native coronary artery without angina pectoris: Secondary | ICD-10-CM | POA: Diagnosis not present

## 2017-08-18 DIAGNOSIS — I69392 Facial weakness following cerebral infarction: Secondary | ICD-10-CM | POA: Diagnosis not present

## 2017-08-18 DIAGNOSIS — Z48812 Encounter for surgical aftercare following surgery on the circulatory system: Secondary | ICD-10-CM | POA: Diagnosis not present

## 2017-08-18 DIAGNOSIS — I1310 Hypertensive heart and chronic kidney disease without heart failure, with stage 1 through stage 4 chronic kidney disease, or unspecified chronic kidney disease: Secondary | ICD-10-CM | POA: Diagnosis not present

## 2017-08-21 ENCOUNTER — Ambulatory Visit (INDEPENDENT_AMBULATORY_CARE_PROVIDER_SITE_OTHER): Payer: Medicare Other | Admitting: Interventional Cardiology

## 2017-08-21 DIAGNOSIS — I481 Persistent atrial fibrillation: Secondary | ICD-10-CM

## 2017-08-21 DIAGNOSIS — I4819 Other persistent atrial fibrillation: Secondary | ICD-10-CM

## 2017-08-21 DIAGNOSIS — I2510 Atherosclerotic heart disease of native coronary artery without angina pectoris: Secondary | ICD-10-CM | POA: Diagnosis not present

## 2017-08-21 DIAGNOSIS — I1310 Hypertensive heart and chronic kidney disease without heart failure, with stage 1 through stage 4 chronic kidney disease, or unspecified chronic kidney disease: Secondary | ICD-10-CM | POA: Diagnosis not present

## 2017-08-21 DIAGNOSIS — Z5181 Encounter for therapeutic drug level monitoring: Secondary | ICD-10-CM

## 2017-08-21 DIAGNOSIS — I480 Paroxysmal atrial fibrillation: Secondary | ICD-10-CM | POA: Diagnosis not present

## 2017-08-21 DIAGNOSIS — I69392 Facial weakness following cerebral infarction: Secondary | ICD-10-CM | POA: Diagnosis not present

## 2017-08-21 DIAGNOSIS — E1122 Type 2 diabetes mellitus with diabetic chronic kidney disease: Secondary | ICD-10-CM | POA: Diagnosis not present

## 2017-08-21 DIAGNOSIS — Z48812 Encounter for surgical aftercare following surgery on the circulatory system: Secondary | ICD-10-CM | POA: Diagnosis not present

## 2017-08-21 LAB — POCT INR: INR: 2.1

## 2017-08-22 DIAGNOSIS — I1310 Hypertensive heart and chronic kidney disease without heart failure, with stage 1 through stage 4 chronic kidney disease, or unspecified chronic kidney disease: Secondary | ICD-10-CM | POA: Diagnosis not present

## 2017-08-22 DIAGNOSIS — I480 Paroxysmal atrial fibrillation: Secondary | ICD-10-CM | POA: Diagnosis not present

## 2017-08-22 DIAGNOSIS — I69392 Facial weakness following cerebral infarction: Secondary | ICD-10-CM | POA: Diagnosis not present

## 2017-08-22 DIAGNOSIS — I2510 Atherosclerotic heart disease of native coronary artery without angina pectoris: Secondary | ICD-10-CM | POA: Diagnosis not present

## 2017-08-22 DIAGNOSIS — E1122 Type 2 diabetes mellitus with diabetic chronic kidney disease: Secondary | ICD-10-CM | POA: Diagnosis not present

## 2017-08-22 DIAGNOSIS — Z48812 Encounter for surgical aftercare following surgery on the circulatory system: Secondary | ICD-10-CM | POA: Diagnosis not present

## 2017-08-29 ENCOUNTER — Other Ambulatory Visit: Payer: Self-pay | Admitting: Internal Medicine

## 2017-09-02 ENCOUNTER — Other Ambulatory Visit: Payer: Self-pay | Admitting: Internal Medicine

## 2017-09-09 ENCOUNTER — Other Ambulatory Visit: Payer: Self-pay | Admitting: Internal Medicine

## 2017-09-09 ENCOUNTER — Ambulatory Visit: Payer: Self-pay

## 2017-09-09 DIAGNOSIS — H353211 Exudative age-related macular degeneration, right eye, with active choroidal neovascularization: Secondary | ICD-10-CM | POA: Diagnosis not present

## 2017-09-09 DIAGNOSIS — H35421 Microcystoid degeneration of retina, right eye: Secondary | ICD-10-CM | POA: Diagnosis not present

## 2017-09-09 DIAGNOSIS — H35431 Paving stone degeneration of retina, right eye: Secondary | ICD-10-CM | POA: Diagnosis not present

## 2017-09-09 DIAGNOSIS — E113391 Type 2 diabetes mellitus with moderate nonproliferative diabetic retinopathy without macular edema, right eye: Secondary | ICD-10-CM | POA: Diagnosis not present

## 2017-09-11 ENCOUNTER — Encounter (INDEPENDENT_AMBULATORY_CARE_PROVIDER_SITE_OTHER): Payer: Self-pay

## 2017-09-11 ENCOUNTER — Ambulatory Visit (INDEPENDENT_AMBULATORY_CARE_PROVIDER_SITE_OTHER): Payer: Medicare Other

## 2017-09-11 DIAGNOSIS — I481 Persistent atrial fibrillation: Secondary | ICD-10-CM | POA: Diagnosis not present

## 2017-09-11 DIAGNOSIS — Z5181 Encounter for therapeutic drug level monitoring: Secondary | ICD-10-CM

## 2017-09-11 DIAGNOSIS — I4819 Other persistent atrial fibrillation: Secondary | ICD-10-CM

## 2017-09-11 LAB — POCT INR: INR: 1.8

## 2017-09-12 ENCOUNTER — Ambulatory Visit (INDEPENDENT_AMBULATORY_CARE_PROVIDER_SITE_OTHER): Payer: Medicare Other

## 2017-09-12 DIAGNOSIS — Z23 Encounter for immunization: Secondary | ICD-10-CM | POA: Diagnosis not present

## 2017-09-12 NOTE — Progress Notes (Signed)
Patient presents to the office for annual flu vaccine. Vaccine administered in right deltoid without any complications.

## 2017-09-15 ENCOUNTER — Ambulatory Visit (INDEPENDENT_AMBULATORY_CARE_PROVIDER_SITE_OTHER): Payer: Medicare Other | Admitting: *Deleted

## 2017-09-15 DIAGNOSIS — I495 Sick sinus syndrome: Secondary | ICD-10-CM | POA: Diagnosis not present

## 2017-09-16 LAB — CUP PACEART REMOTE DEVICE CHECK
Battery Impedance: 355 Ohm
Battery Remaining Longevity: 112 mo
Battery Voltage: 2.78 V
Brady Statistic AP VP Percent: 0 %
Brady Statistic AS VS Percent: 84 %
Implantable Lead Implant Date: 20130610
Implantable Lead Implant Date: 20130610
Implantable Lead Location: 753859
Implantable Lead Location: 753860
Implantable Lead Model: 5076
Implantable Lead Model: 5092
Implantable Pulse Generator Implant Date: 20130610
Lead Channel Impedance Value: 326 Ohm
Lead Channel Impedance Value: 756 Ohm
Lead Channel Pacing Threshold Pulse Width: 0.4 ms
Lead Channel Setting Pacing Amplitude: 2 V
Lead Channel Setting Pacing Amplitude: 2.5 V
Lead Channel Setting Pacing Pulse Width: 0.4 ms
Lead Channel Setting Sensing Sensitivity: 5.6 mV
MDC IDC MSMT LEADCHNL RA PACING THRESHOLD AMPLITUDE: 0.5 V
MDC IDC MSMT LEADCHNL RA PACING THRESHOLD PULSEWIDTH: 0.4 ms
MDC IDC MSMT LEADCHNL RV PACING THRESHOLD AMPLITUDE: 0.75 V
MDC IDC SESS DTM: 20181106194534
MDC IDC STAT BRADY AP VS PERCENT: 16 %
MDC IDC STAT BRADY AS VP PERCENT: 0 %

## 2017-09-16 NOTE — Progress Notes (Signed)
Remote pacemaker transmission.   

## 2017-09-17 ENCOUNTER — Encounter: Payer: Self-pay | Admitting: Cardiology

## 2017-09-23 ENCOUNTER — Other Ambulatory Visit: Payer: Self-pay | Admitting: Internal Medicine

## 2017-09-24 ENCOUNTER — Encounter: Payer: Self-pay | Admitting: Physician Assistant

## 2017-09-25 ENCOUNTER — Ambulatory Visit (INDEPENDENT_AMBULATORY_CARE_PROVIDER_SITE_OTHER): Payer: Medicare Other | Admitting: *Deleted

## 2017-09-25 DIAGNOSIS — I481 Persistent atrial fibrillation: Secondary | ICD-10-CM

## 2017-09-25 DIAGNOSIS — Z5181 Encounter for therapeutic drug level monitoring: Secondary | ICD-10-CM

## 2017-09-25 DIAGNOSIS — I4819 Other persistent atrial fibrillation: Secondary | ICD-10-CM

## 2017-09-25 LAB — POCT INR: INR: 2.4

## 2017-09-25 NOTE — Patient Instructions (Signed)
Continue taking same dosage 4mg  daily except 2mg  on Tuesdays and Saturdays.  Recheck in 3 weeks.

## 2017-10-06 ENCOUNTER — Other Ambulatory Visit: Payer: Self-pay | Admitting: Internal Medicine

## 2017-10-16 DIAGNOSIS — L603 Nail dystrophy: Secondary | ICD-10-CM | POA: Diagnosis not present

## 2017-10-16 DIAGNOSIS — E1151 Type 2 diabetes mellitus with diabetic peripheral angiopathy without gangrene: Secondary | ICD-10-CM | POA: Diagnosis not present

## 2017-10-16 DIAGNOSIS — I739 Peripheral vascular disease, unspecified: Secondary | ICD-10-CM | POA: Diagnosis not present

## 2017-10-17 DIAGNOSIS — H43811 Vitreous degeneration, right eye: Secondary | ICD-10-CM | POA: Diagnosis not present

## 2017-10-17 DIAGNOSIS — H35431 Paving stone degeneration of retina, right eye: Secondary | ICD-10-CM | POA: Diagnosis not present

## 2017-10-17 DIAGNOSIS — E113391 Type 2 diabetes mellitus with moderate nonproliferative diabetic retinopathy without macular edema, right eye: Secondary | ICD-10-CM | POA: Diagnosis not present

## 2017-10-17 DIAGNOSIS — H353211 Exudative age-related macular degeneration, right eye, with active choroidal neovascularization: Secondary | ICD-10-CM | POA: Diagnosis not present

## 2017-10-27 ENCOUNTER — Ambulatory Visit (INDEPENDENT_AMBULATORY_CARE_PROVIDER_SITE_OTHER): Payer: Medicare Other | Admitting: *Deleted

## 2017-10-27 DIAGNOSIS — Z5181 Encounter for therapeutic drug level monitoring: Secondary | ICD-10-CM

## 2017-10-27 DIAGNOSIS — I481 Persistent atrial fibrillation: Secondary | ICD-10-CM | POA: Diagnosis not present

## 2017-10-27 DIAGNOSIS — I4819 Other persistent atrial fibrillation: Secondary | ICD-10-CM

## 2017-10-27 LAB — POCT INR: INR: 3.3

## 2017-10-27 NOTE — Patient Instructions (Signed)
Description   Do not take any Coumadin then continue taking same dosage 4mg  daily except 2mg  on Tuesdays and Saturdays.  Recheck in 3 weeks.

## 2017-10-28 ENCOUNTER — Other Ambulatory Visit: Payer: Self-pay | Admitting: *Deleted

## 2017-10-28 DIAGNOSIS — I480 Paroxysmal atrial fibrillation: Secondary | ICD-10-CM

## 2017-10-28 DIAGNOSIS — I10 Essential (primary) hypertension: Secondary | ICD-10-CM

## 2017-10-28 MED ORDER — ATORVASTATIN CALCIUM 40 MG PO TABS: 40.0000 mg | ORAL_TABLET | Freq: Every day | ORAL | 0 refills | Status: AC

## 2017-10-28 MED ORDER — GLIMEPIRIDE 2 MG PO TABS: ORAL_TABLET | ORAL | 0 refills | Status: DC

## 2017-10-28 MED ORDER — METFORMIN HCL ER 500 MG PO TB24: 500.0000 mg | ORAL_TABLET | ORAL | 0 refills | Status: DC

## 2017-10-28 MED ORDER — FUROSEMIDE 20 MG PO TABS: ORAL_TABLET | ORAL | 0 refills | Status: DC

## 2017-10-28 MED ORDER — ESCITALOPRAM OXALATE 5 MG PO TABS: ORAL_TABLET | ORAL | 0 refills | Status: DC

## 2017-10-30 ENCOUNTER — Other Ambulatory Visit: Payer: Self-pay | Admitting: Internal Medicine

## 2017-10-30 MED ORDER — NITROGLYCERIN 0.4 MG SL SUBL: 0.4000 mg | SUBLINGUAL_TABLET | SUBLINGUAL | 2 refills | Status: AC | PRN

## 2017-10-30 MED ORDER — METOPROLOL SUCCINATE ER 100 MG PO TB24: ORAL_TABLET | ORAL | 2 refills | Status: DC

## 2017-10-30 MED ORDER — WARFARIN SODIUM 4 MG PO TABS: ORAL_TABLET | ORAL | 1 refills | Status: AC

## 2017-10-30 NOTE — Telephone Encounter (Signed)
Pt' medications was sent to pt's pharmacy as requested. Confirmation received. Pt pharmacy is also requesting a refill on Warfarin, sent to [facility]. Please address. Thanks

## 2017-11-05 ENCOUNTER — Other Ambulatory Visit: Payer: Self-pay | Admitting: *Deleted

## 2017-11-05 DIAGNOSIS — I480 Paroxysmal atrial fibrillation: Secondary | ICD-10-CM

## 2017-11-05 DIAGNOSIS — I10 Essential (primary) hypertension: Secondary | ICD-10-CM

## 2017-11-05 MED ORDER — GLIMEPIRIDE 2 MG PO TABS: ORAL_TABLET | ORAL | 0 refills | Status: DC

## 2017-11-05 MED ORDER — METFORMIN HCL ER 500 MG PO TB24: 500.0000 mg | ORAL_TABLET | ORAL | 0 refills | Status: DC

## 2017-11-17 ENCOUNTER — Ambulatory Visit (INDEPENDENT_AMBULATORY_CARE_PROVIDER_SITE_OTHER): Payer: Medicare Other | Admitting: Physician Assistant

## 2017-11-17 ENCOUNTER — Ambulatory Visit (INDEPENDENT_AMBULATORY_CARE_PROVIDER_SITE_OTHER): Payer: Medicare Other | Admitting: *Deleted

## 2017-11-17 ENCOUNTER — Encounter: Payer: Self-pay | Admitting: Physician Assistant

## 2017-11-17 VITALS — BP 132/76 | HR 80 | Temp 97.9°F | Resp 16 | Ht 62.0 in | Wt 137.6 lb

## 2017-11-17 DIAGNOSIS — E1121 Type 2 diabetes mellitus with diabetic nephropathy: Secondary | ICD-10-CM | POA: Diagnosis not present

## 2017-11-17 DIAGNOSIS — I10 Essential (primary) hypertension: Secondary | ICD-10-CM

## 2017-11-17 DIAGNOSIS — I481 Persistent atrial fibrillation: Secondary | ICD-10-CM | POA: Diagnosis not present

## 2017-11-17 DIAGNOSIS — E1169 Type 2 diabetes mellitus with other specified complication: Secondary | ICD-10-CM

## 2017-11-17 DIAGNOSIS — I4891 Unspecified atrial fibrillation: Secondary | ICD-10-CM | POA: Diagnosis not present

## 2017-11-17 DIAGNOSIS — Z0001 Encounter for general adult medical examination with abnormal findings: Secondary | ICD-10-CM | POA: Diagnosis not present

## 2017-11-17 DIAGNOSIS — H3530 Unspecified macular degeneration: Secondary | ICD-10-CM

## 2017-11-17 DIAGNOSIS — I4819 Other persistent atrial fibrillation: Secondary | ICD-10-CM

## 2017-11-17 DIAGNOSIS — E7849 Other hyperlipidemia: Secondary | ICD-10-CM

## 2017-11-17 DIAGNOSIS — I119 Hypertensive heart disease without heart failure: Secondary | ICD-10-CM

## 2017-11-17 DIAGNOSIS — Z9001 Acquired absence of eye: Secondary | ICD-10-CM

## 2017-11-17 DIAGNOSIS — I495 Sick sinus syndrome: Secondary | ICD-10-CM

## 2017-11-17 DIAGNOSIS — I420 Dilated cardiomyopathy: Secondary | ICD-10-CM | POA: Diagnosis not present

## 2017-11-17 DIAGNOSIS — Z6825 Body mass index (BMI) 25.0-25.9, adult: Secondary | ICD-10-CM

## 2017-11-17 DIAGNOSIS — R6889 Other general symptoms and signs: Secondary | ICD-10-CM

## 2017-11-17 DIAGNOSIS — Z79899 Other long term (current) drug therapy: Secondary | ICD-10-CM

## 2017-11-17 DIAGNOSIS — Z7901 Long term (current) use of anticoagulants: Secondary | ICD-10-CM

## 2017-11-17 DIAGNOSIS — I6521 Occlusion and stenosis of right carotid artery: Secondary | ICD-10-CM

## 2017-11-17 DIAGNOSIS — Z9889 Other specified postprocedural states: Secondary | ICD-10-CM

## 2017-11-17 DIAGNOSIS — Z5181 Encounter for therapeutic drug level monitoring: Secondary | ICD-10-CM

## 2017-11-17 DIAGNOSIS — E1140 Type 2 diabetes mellitus with diabetic neuropathy, unspecified: Secondary | ICD-10-CM | POA: Diagnosis not present

## 2017-11-17 DIAGNOSIS — E559 Vitamin D deficiency, unspecified: Secondary | ICD-10-CM

## 2017-11-17 DIAGNOSIS — E785 Hyperlipidemia, unspecified: Secondary | ICD-10-CM

## 2017-11-17 DIAGNOSIS — C44310 Basal cell carcinoma of skin of unspecified parts of face: Secondary | ICD-10-CM

## 2017-11-17 DIAGNOSIS — Z0000 Encounter for general adult medical examination without abnormal findings: Secondary | ICD-10-CM

## 2017-11-17 DIAGNOSIS — F3341 Major depressive disorder, recurrent, in partial remission: Secondary | ICD-10-CM

## 2017-11-17 LAB — POCT INR: INR: 2.2

## 2017-11-17 MED ORDER — ESCITALOPRAM OXALATE 10 MG PO TABS: ORAL_TABLET | ORAL | 1 refills | Status: DC

## 2017-11-17 MED ORDER — GLUCOSE BLOOD VI STRP: ORAL_STRIP | 4 refills | Status: AC

## 2017-11-17 NOTE — Patient Instructions (Signed)
Description   Continue taking 1 tablet daily except 1/2 tablet on Tuesdays and Saturdays. Recheck INR in 4 weeks.      

## 2017-11-17 NOTE — Patient Instructions (Addendum)
Your A1C is a measure of your sugar over the past 3 months and is not affected by what you have eaten over the past few days. Diabetes increases your chances of stroke and heart attack over 300 % and is the leading cause of blindness and kidney failure in the [place]. Please make sure you decrease bad carbs like white bread, white rice, potatoes, corn, soft drinks, pasta, cereals, refined sugars, sweet tea, dried fruits, and fruit juice. Good carbs are okay to eat in moderation like sweet potatoes, brown rice, whole grain pasta/bread, most fruit (except dried fruit) and you can eat as many veggies as you want.   Greater than 6.5 is considered diabetic. Between 6.4 and 5.7 is prediabetic If your A1C is less than 5.7 you are NOT diabetic.  Targets for Glucose Readings: Time of Check Target for patients WITHOUT Diabetes Target for DIABETICS  Before Meals Less than 100  less than 150  Two hours after meals Less than 200  Less than 250   Please be careful with glimepiride (Amaryl).  If at any time you start to have low blood sugars in the morning or during the day please stop this medication. Please never take this medication if you are sick or can not eat. A low blood sugar is much more dangerous than a high blood sugar. Your brain needs two things, sugar and oxygen.      Bad carbs also include fruit juice, alcohol, and sweet tea. These are empty calories that do not signal to your brain that you are full.   Please remember the good carbs are still carbs which convert into sugar. So please measure them out no more than 1/2-1 cup of rice, oatmeal, pasta, and beans  Veggies are however free foods! Pile them on.   Not all fruit is created equal. Please see the list below, the fruit at the bottom is higher in sugars than the fruit at the top. Please avoid all dried fruits.

## 2017-11-17 NOTE — Progress Notes (Signed)
MEDICARE ANNUAL WELLNESS VISIT AND FOLLOW UP  Assessment:    Persistent atrial fibrillation (HCC) Continue coumadin Continue cardio follow up  Tachycardia-bradycardia [facility]) Continue cardio follow up  Sick sinus syndrome ([place]) Continue cardio follow up -     TSH  Dilated cardiomyopathy ([place]) Control blood pressure, cholesterol, glucose, increase exercise.  Continue cardio follow up  T2_NIDDM w/Stage 3 CKD (GFR 40 ml/min) Discussed hypoglycemia symptoms and the amaryl -     glucose blood test strip; Check sugars once to twice a day for diabetes E11.21 -     TSH -     Hemoglobin A1c  Type 2 diabetes mellitus with diabetic neuropathy, without long-term current use of insulin (HCC) Discussed checking feet daily, get walker to help with balance -     Walker standard -     Hemoglobin A1c  Type 2 diabetes mellitus with hyperlipidemia ([place]) Discussed general issues about diabetes pathophysiology and management., Educational material distributed., Suggested low cholesterol diet., Encouraged aerobic exercise., Discussed foot care., Reminded to get yearly retinal exam. -     Hemoglobin A1c  Other hyperlipidemia -continue medications, check lipids, decrease fatty foods, increase activity.  -     Lipid panel  Medication management -     BASIC METABOLIC PANEL WITH GFR -     CBC with Differential/Platelet -     Hepatic function panel -     Magnesium  Essential hypertension - continue medications, DASH diet, exercise and monitor at home. Call if greater than 130/80.  -     TSH  Hypertensive heart disease without heart failure Control blood pressure, cholesterol, glucose, increase exercise.   Asymptomatic stenosis of right carotid artery S/p RCEA  Vitamin D deficiency Continue supplement  H/O enucleation of left eyeball -     Walker standard  Macular degeneration of right eye, unspecified type -     Walker standard - get exams from Dr. Juleen China term current use of  anticoagulant therapy Continue coumadin clinic  Basal cell carcinoma (BCC) of skin of face, unspecified part of face Monitor  BMI 25.0-25.9,adult Monitor  Encounter for Medicare annual wellness exam Declines labs, preventative exams and vaccines due to age Will respect her wishes  Depression major recurrent partial remission -     escitalopram (LEXAPRO) 10 MG tablet; TAKE ONE (1) TABLET BY MOUTH EVERY DAY     Over 30 minutes of exam, counseling, chart review, and critical decision making was performed  Future Appointments  Date Time Provider [place]  12/15/2017 10:00 AM [facility] VASC 6 [facility]  12/15/2017 10:45 AM Serafina Mitchell, MD [facility]  12/15/2017  2:00 PM [facility] DEVICE REMOTES [facility]  12/19/2017 11:00 AM [facility] COUMADIN CLINIC [facility]  03/23/2018  2:00 PM Vicie Mutters, PA-C [facility] None    Plan:   During the course of the visit the patient was educated and counseled about appropriate screening and preventive services including:    Pneumococcal vaccine   Influenza vaccine  Td vaccine  Prevnar 13  Screening electrocardiogram  Screening mammography  Bone densitometry screening  Colorectal cancer screening  Diabetes screening  Glaucoma screening  Nutrition counseling   Advanced directives: given info/requested copies   Subjective:   Early Ord is a 84 y.o. female who presents for Medicare Annual Wellness Visit and 3 month follow up on hypertension, prediabetes, hyperlipidemia, vitamin D def.   Patient was taken to the hospital with syncope, she was found to have right carotid stenosis  so she underwent R CEA 05/13/2017. She had surgery complications immidiately following, she had a restenosis of her carotid and had to be taken back to the operating room and she had angina post operatively with elevated troponin but due to her age she was treated medically. She was discharged to [facility] but  she is back home and living with her brother.  She has had a fall with a rolling walker, needs a RX for a new walker. She has decreased vision, imbalance.   Her blood pressure has been controlled at home, today their BP is BP: 132/76 She does not workout. She denies chest pain, shortness of breath, dizziness.  She notes that she has been having some issues with her lower back.  She reports that this prevents her from doing exercise.  She has pAfib since 2011 and has SSS s/p pacemaker placement in 2013, she is on coumadin and follows with the coumadin clinic.   Lab Results  Component Value Date   INR 2.2 11/17/2017   INR 3.3 10/27/2017   INR 2.4 09/25/2017   She had left orbital exenteration with anterior craniofacial resection and reconstruction with Dr. Mardee Postin at [facility] on 11/29/2014 for a [id number] basal cell carcinoma, She is on lexapro for depression.  She is on cholesterol medication and denies myalgias. Her cholesterol is at goal. The cholesterol last visit was:   Lab Results  Component Value Date   CHOL 178 04/09/2017   HDL 65 04/09/2017   LDLCALC 75 04/09/2017   TRIG 192 (H) 04/09/2017   CHOLHDL 2.7 04/09/2017   She has not been working on diet and exercise for diabetes with CKD and neuropathy, she is checking her sugars twice a day, and denies foot ulcerations, hyperglycemia, hypoglycemia , increased appetite, nausea, paresthesia of the feet, polydipsia, polyuria, visual disturbances, vomiting and weight loss. Last A1C in the office was:  Lab Results  Component Value Date   HGBA1C 6.8 (H) 04/09/2017   Last GFR Lab Results  Component Value Date   GFRNONAA 36 (L) 05/21/2017   Patient is on Vitamin D supplement. Lab Results  Component Value Date   VD25OH 36 04/09/2017     BMI is Body mass index is 25.17 kg/m., she is working on diet and exercise. Wt Readings from Last 3 Encounters:  11/17/17 137 lb 9.6 oz (62.4 kg)  08/06/17 135 lb (61.2 kg)  06/16/17 140 lb (63.5 kg)      Medication Review Current Outpatient Medications on File Prior to Visit  Medication Sig  . aspirin EC 81 MG EC tablet Take 1 tablet (81 mg total) by mouth daily.  Marland Kitchen atorvastatin (LIPITOR) 40 MG tablet Take 1 tablet (40 mg total) by mouth daily at 6 PM.  . b complex vitamins tablet Take 1 tablet by mouth daily.  . Cholecalciferol (VITAMIN D PO) Take 1 tablet by mouth every evening.   . Coenzyme Q10 (CO Q 10) 100 MG CAPS Take 100 mg by mouth every evening.   . furosemide (LASIX) 20 MG tablet TAKE ONE (1) TABLET BY MOUTH EVERY DAY  . glimepiride (AMARYL) 2 MG tablet TAKE ONE (1) TABLET BY MOUTH EVERY DAY  . Magnesium 250 MG TABS Take 250 mg by mouth every evening.   . metFORMIN (GLUCOPHAGE-XR) 500 MG 24 hr tablet Take 1 tablet (500 mg total) by mouth every morning. Take with meal for diabetes  . metoprolol succinate (TOPROL-XL) 100 MG 24 hr tablet TAKE ONE (1) TABLET BY MOUTH TWO (2)  TIMES DAILY WITH OR IMMEDIATELY FOLLOWING A MEAL  . nitroGLYCERIN (NITROSTAT) 0.4 MG SL tablet Place 1 tablet (0.4 mg total) under the tongue every 5 (five) minutes as needed for chest pain.  Marland Kitchen warfarin (COUMADIN) 4 MG tablet Take as directed by Coumadin Clinic   No current facility-administered medications on file prior to visit.     Allergies: Allergies  Allergen Reactions  . No Known Allergies     Current Problems (verified) has Tachycardia-bradycardia ([place]); Long term current use of anticoagulant therapy; Hyperlipidemia; Sick sinus syndrome ([place]); Retinopathy; T2_NIDDM w/Stage 3 CKD (GFR 40 ml/min); Vitamin D deficiency; Medication management; Persistent atrial fibrillation ([place]); Basal cell carcinoma; DM neuropathy, type II diabetes mellitus ([place]); Type 2 diabetes mellitus with hyperlipidemia ([place]); H/O enucleation of left eyeball; BP (high blood pressure); HCD (hypertensive cardiovascular disease); Macular degeneration, right eye; Pseudophakia, right eye; Asymptomatic stenosis of right carotid  artery; Elevated troponin- unclear etiology; Dilated cardiomyopathy ([place]); Pressure injury of skin; Palliative care encounter; Goals of care, counseling/discussion; Afib ([place]); and Depression, major, recurrent, in partial remission ([place]) on their problem list.  Screening Tests Immunization History  Administered Date(s) Administered  . Influenza, High Dose Seasonal PF 09/12/2017    Preventative care: Last colonoscopy:  Declines Last mammogram: Declines mammograms Last pap smear/pelvic exam: Remote, refuses further  DEXA:2015  Prior vaccinations:  PATIENT DECLINES ALL VACCINES   Names of Other Physician/Practitioners you currently use: Marland Kitchen [facility]- here for primary care  Dr. [facility], eye doctor, last visit 10/2017 going every 1-2 months DIABETIC EYE EXAM 08/06/2016- seeing Dr. Delman Cheadle- not seeing them anymore  Does see a dentist but does not know the dentist name, dentist, last visit 2016 Patient Care Team: Unk Pinto, MD as PCP - General (Internal Medicine) Sherlynn Stalls, MD as Consulting Physician (Ophthalmology)  Surgical: She  has a past surgical history that includes Pacemaker insertion (04/20/12); Dental surgery; permanent pacemaker insertion (N/A, 04/20/2012); Eye surgery (Left); Endarterectomy (Right, 05/13/2017); Endarterectomy (Right, 05/13/2017); and Patch angioplasty (Right, 05/13/2017). Family Her family history includes Cancer in her other; Heart attack in her father; Heart disease in her father and mother; Hypertension in her mother. Social history  She reports that  has never smoked. she has never used smokeless tobacco. She reports that she does not drink alcohol or use drugs.  MEDICARE WELLNESS OBJECTIVES: Physical activity: Current Exercise Habits: The patient does not participate in regular exercise at present Cardiac risk factors: Cardiac Risk Factors include: advanced age (>28men, >19 women);diabetes  mellitus;dyslipidemia;family history of premature cardiovascular disease;hypertension;sedentary lifestyle Depression/mood screen:   Depression screen [facility] 2/9 11/17/2017  Decreased Interest 0  Down, Depressed, Hopeless 0  PHQ - 2 Score 0  Altered sleeping -  Tired, decreased energy -  Change in appetite -  Feeling bad or failure about yourself  -  Trouble concentrating -  Moving slowly or fidgety/restless -  Suicidal thoughts -  PHQ-9 Score -  Difficult doing work/chores -    ADLs:  In your present state of health, do you have any difficulty performing the following activities: 11/17/2017 05/14/2017  Hearing? N N  Vision? Y Y  Comment - -  Difficulty concentrating or making decisions? Y N  Comment - -  Walking or climbing stairs? Y Y  Comment - -  Dressing or bathing? Y N  Doing errands, shopping? Y [place] and eating ? Y -  Managing your Medications? Y -  Managing your Finances?  Y -  Housekeeping or managing your Housekeeping? Y -  Some recent data might be hidden     Cognitive Testing  Alert? Yes  Normal Appearance?Yes  Oriented to person? Yes  Place? Yes   Time? Yes  Recall of three objects?  Yes  Can perform simple calculations? Yes  Displays appropriate judgment?Yes  Can read the correct time from a watch face?Yes  EOL planning: Does Patient Have a Medical Advance Directive?: Yes Type of Advance Directive: Healthcare Power of Attorney, Living will Copy of [place] in Chart?: No - copy requested   Objective:   Today's Vitals   11/17/17 1051  BP: 132/76  Pulse: 80  Resp: 16  Temp: 97.9 F (36.6 C)  SpO2: 95%  Weight: 137 lb 9.6 oz (62.4 kg)  Height: 5\' 2"  (1.575 m)   Body mass index is 25.17 kg/m.  General appearance: alert, no distress, WD/WN,  female HEENT: normocephalic, sclerae anicteric, TMs pearly, nares patent, no discharge or erythema, pharynx normal.  Left eye enucleated and covered with a bandage.    Oral cavity: MMM, no lesions Neck: supple, no lymphadenopathy, no thyromegaly, no masses Heart: RRR, normal S1, S2, no murmurs Lungs: CTA bilaterally, no wheezes, rhonchi, or rales Abdomen: +bs, soft, non tender, non distended, no masses, no hepatomegaly, no splenomegaly Musculoskeletal: nontender, no swelling, no obvious deformity Extremities: no edema, no cyanosis, no clubbing Pulses: 2+ symmetric, upper and lower extremities, normal cap refill Neurological: alert, oriented x 3, CN2-12 intact, strength decreasedl upper extremities and lower extremities normal for her age, sensation decreased bilateral feet, DTRs 2+ throughout, no cerebellar signs, gait unsteady walking with a walker Psychiatric: normal affect, behavior normal, pleasant  Breast: defer Gyn: defer Rectal: defer   Medicare Attestation I have personally reviewed: The patient's medical and social history Their use of alcohol, tobacco or illicit drugs Their current medications and supplements The patient's functional ability including ADLs,fall risks, home safety risks, cognitive, and hearing and visual impairment Diet and physical activities Evidence for depression or mood disorders  The patient's weight, height, BMI, and visual acuity have been recorded in the chart.  I have made referrals, counseling, and provided education to the patient based on review of the above and I have provided the patient with a written personalized care plan for preventive services.     Vicie Mutters, PA-C   11/17/2017

## 2017-11-18 LAB — HEPATIC FUNCTION PANEL
AG Ratio: 1.2 (calc) (ref 1.0–2.5)
ALBUMIN MSPROF: 3.9 g/dL (ref 3.6–5.1)
ALT: 22 U/L (ref 6–29)
AST: 23 U/L (ref 10–35)
Alkaline phosphatase (APISO): 96 U/L (ref 33–130)
BILIRUBIN DIRECT: 0.1 mg/dL (ref 0.0–0.2)
GLOBULIN: 3.3 g/dL (ref 1.9–3.7)
Indirect Bilirubin: 0.3 mg/dL (calc) (ref 0.2–1.2)
Total Bilirubin: 0.4 mg/dL (ref 0.2–1.2)
Total Protein: 7.2 g/dL (ref 6.1–8.1)

## 2017-11-18 LAB — LIPID PANEL
CHOL/HDL RATIO: 1.9 (calc) (ref <5.0)
CHOLESTEROL: 122 mg/dL (ref <200)
HDL: 65 mg/dL (ref >50)
LDL CHOLESTEROL (CALC): 40 mg/dL
Non-HDL Cholesterol (Calc): 57 mg/dL (calc) (ref <130)
Triglycerides: 86 mg/dL (ref <150)

## 2017-11-18 LAB — CBC WITH DIFFERENTIAL/PLATELET
BASOS PCT: 0.7 %
Basophils Absolute: 50 cells/uL (ref 0–200)
Eosinophils Absolute: 238 cells/uL (ref 15–500)
Eosinophils Relative: 3.3 %
HCT: 36.7 % (ref 35.0–45.0)
HEMOGLOBIN: 12.1 g/dL (ref 11.7–15.5)
Lymphs Abs: 1706 cells/uL (ref 850–3900)
MCH: 29.5 pg (ref 27.0–33.0)
MCHC: 33 g/dL (ref 32.0–36.0)
MCV: 89.5 fL (ref 80.0–100.0)
MONOS PCT: 5.8 %
MPV: 9.5 fL (ref 7.5–12.5)
NEUTROS ABS: 4788 {cells}/uL (ref 1500–7800)
Neutrophils Relative %: 66.5 %
PLATELETS: 316 10*3/uL (ref 140–400)
RBC: 4.1 10*6/uL (ref 3.80–5.10)
RDW: 12.1 % (ref 11.0–15.0)
Total Lymphocyte: 23.7 %
WBC mixed population: 418 cells/uL (ref 200–950)
WBC: 7.2 10*3/uL (ref 3.8–10.8)

## 2017-11-18 LAB — TSH: TSH: 0.87 m[IU]/L (ref 0.40–4.50)

## 2017-11-18 LAB — BASIC METABOLIC PANEL WITH GFR
BUN/Creatinine Ratio: 31 (calc) — ABNORMAL HIGH (ref 6–22)
BUN: 45 mg/dL — AB (ref 7–25)
CHLORIDE: 101 mmol/L (ref 98–110)
CO2: 31 mmol/L (ref 20–32)
CREATININE: 1.44 mg/dL — AB (ref 0.60–0.88)
Calcium: 9.6 mg/dL (ref 8.6–10.4)
GFR, EST AFRICAN AMERICAN: 37 mL/min/{1.73_m2} — AB (ref >=60)
GFR, EST NON AFRICAN AMERICAN: 32 mL/min/{1.73_m2} — AB (ref >=60)
Glucose, Bld: 149 mg/dL — ABNORMAL HIGH (ref 65–99)
Potassium: 5.5 mmol/L — ABNORMAL HIGH (ref 3.5–5.3)
Sodium: 138 mmol/L (ref 135–146)

## 2017-11-18 LAB — HEMOGLOBIN A1C
Hgb A1c MFr Bld: 6.7 % of total Hgb — ABNORMAL HIGH (ref <5.7)
MEAN PLASMA GLUCOSE: 146 (calc)
eAG (mmol/L): 8.1 (calc)

## 2017-11-18 LAB — MAGNESIUM: Magnesium: 2.6 mg/dL — ABNORMAL HIGH (ref 1.5–2.5)

## 2017-11-20 ENCOUNTER — Other Ambulatory Visit: Payer: Self-pay

## 2017-11-20 MED ORDER — BLOOD GLUCOSE METER KIT: PACK | 0 refills | Status: AC

## 2017-11-26 DIAGNOSIS — H43811 Vitreous degeneration, right eye: Secondary | ICD-10-CM | POA: Diagnosis not present

## 2017-11-26 DIAGNOSIS — H353211 Exudative age-related macular degeneration, right eye, with active choroidal neovascularization: Secondary | ICD-10-CM | POA: Diagnosis not present

## 2017-11-26 DIAGNOSIS — H35421 Microcystoid degeneration of retina, right eye: Secondary | ICD-10-CM | POA: Diagnosis not present

## 2017-11-26 DIAGNOSIS — H35431 Paving stone degeneration of retina, right eye: Secondary | ICD-10-CM | POA: Diagnosis not present

## 2017-11-27 ENCOUNTER — Telehealth: Payer: Self-pay | Admitting: Physician Assistant

## 2017-11-27 DIAGNOSIS — R4182 Altered mental status, unspecified: Secondary | ICD-10-CM

## 2017-11-27 NOTE — Telephone Encounter (Signed)
Niece aware of referral being sent for a neurology appointment.

## 2017-11-27 NOTE — Telephone Encounter (Signed)
[name], Kylie Sanchez, is calling concerned about recent mood changes. Patient is 84 year old female with SSS, afib, DM2, recent RCEA, seen last at 11/17/2017 when her lexapro was increased from 5mg  to 10 mg for depression, at that time she was not having any significant neurological complaints, however since that visit the niece states she is having screaming, outburst, angry, worsening short term memory.   We will suggest decreasing back to 5 mg of the lexapro, and we will send in a neurological evaluation. If she has any headache, changes vision/speech, worsening imbalance, worsening weakness, we suggest taking her to the ER for possible CT scan.

## 2017-12-15 ENCOUNTER — Ambulatory Visit (INDEPENDENT_AMBULATORY_CARE_PROVIDER_SITE_OTHER): Payer: Medicare Other | Admitting: Surgery

## 2017-12-15 ENCOUNTER — Encounter: Payer: Self-pay | Admitting: Surgery

## 2017-12-15 ENCOUNTER — Ambulatory Visit (INDEPENDENT_AMBULATORY_CARE_PROVIDER_SITE_OTHER): Payer: Medicare Other | Admitting: *Deleted

## 2017-12-15 ENCOUNTER — Telehealth: Payer: Self-pay | Admitting: Cardiology

## 2017-12-15 ENCOUNTER — Ambulatory Visit (HOSPITAL_COMMUNITY)
Admission: RE | Admit: 2017-12-15 | Discharge: 2017-12-15 | Disposition: A | Discharge status: Home / Self Care | Payer: Medicare Other | Source: Ambulatory Visit | Attending: Surgery | Admitting: Surgery

## 2017-12-15 VITALS — BP 148/70 | HR 64 | Resp 18 | Ht 62.0 in | Wt 137.0 lb

## 2017-12-15 DIAGNOSIS — I495 Sick sinus syndrome: Secondary | ICD-10-CM

## 2017-12-15 DIAGNOSIS — I6521 Occlusion and stenosis of right carotid artery: Secondary | ICD-10-CM | POA: Diagnosis not present

## 2017-12-15 NOTE — Progress Notes (Signed)
Vascular and Vein Specialist of [facility]  Patient name: Kylie Sanchez MRN: [id number] DOB: Jan 14, 1925 Sex: female   REASON FOR VISIT:    Follow up   [place] ILLNESS:    Kylie Sanchez is a 84 y.o. female who returns today for her first postoperative visit.  The patient recently had a neurologic event and during her workup, she was found to have an 80% right carotid stenosis.  It was felt that her event which was a near syncopal episode was related to cardiac issues.  We had lengthy discussion about our treatment options but ultimately the patient and daughter wanted to proceed with right carotid endarterectomy.  Therefore on 05/13/2017 she was taken to the operating room and underwent a right carotid endarterectomy with bovine pericardial patch angioplasty.  She awoke from the operation neurologically intact and was taken to the recovery room.  In the recovery room she was felt to have a left facial droop.  She was sent downstairs for CT angiogram and her carotid was noted to be occluded.  I therefore took her back emergently to the operating room.  I found a redundant portion of the artery which was resected with primary anastomosis.  The patient awoke from the operation and symptoms were improved.  It was felt that at baseline because of the patient's eye surgery that her facial droop may have been baseline.  While in the hospital she had significant cardiac issues and had a significant myocardial event.  The patient refused intervention because of how she week she was.  She was ultimately discharged to [facility] home.  She is doing well without complaints   PAST MEDICAL HISTORY:   Past Medical History:  Diagnosis Date  . Cancer ([place]) 2015   left eye  . Cataract   . CKD (chronic kidney disease) stage 3, GFR 30-59 ml/min (HCC)    due to DM  . Dizziness   . Hyperlipidemia   . Hypertension   . Hypertensive cardiovascular disease   .  Impaired vision   . Paroxysmal atrial fibrillation (HCC)   . Presence of permanent cardiac pacemaker   . Retinopathy   . Sick sinus syndrome ([place])   . Type II or unspecified type diabetes mellitus without mention of complication, not stated as uncontrolled      FAMILY HISTORY:   Family History  Problem Relation Age of Onset  . Heart attack Father   . Heart disease Father   . Hypertension Mother   . Heart disease Mother   . Cancer Other     SOCIAL HISTORY:   Social History   Tobacco Use  . Smoking status: Never Smoker  . Smokeless tobacco: Never Used  Substance Use Topics  . Alcohol use: No     ALLERGIES:   Allergies  Allergen Reactions  . No Known Allergies      CURRENT MEDICATIONS:   Current Outpatient Medications  Medication Sig Dispense Refill  . aspirin EC 81 MG EC tablet Take 1 tablet (81 mg total) by mouth daily. 30 tablet   . atorvastatin (LIPITOR) 40 MG tablet Take 1 tablet (40 mg total) by mouth daily at 6 PM. 90 tablet 0  . b complex vitamins tablet Take 1 tablet by mouth daily.    . blood glucose meter kit and supplies Test sugars once daily. Dispense based insurance preference. E11.9 1 each 0  . Cholecalciferol (VITAMIN D PO) Take 1 tablet by mouth every evening.     Marland Kitchen  Coenzyme Q10 (CO Q 10) 100 MG CAPS Take 100 mg by mouth every evening.     . escitalopram (LEXAPRO) 10 MG tablet TAKE ONE (1) TABLET BY MOUTH EVERY DAY (Patient taking differently: 5 mg. TAKE ONE (1) TABLET BY MOUTH EVERY DAY) 90 tablet 1  . furosemide (LASIX) 20 MG tablet TAKE ONE (1) TABLET BY MOUTH EVERY DAY 90 tablet 0  . glimepiride (AMARYL) 2 MG tablet TAKE ONE (1) TABLET BY MOUTH EVERY DAY 90 tablet 0  . glucose blood test strip Check sugars once to twice a day for diabetes E11.21 150 each 4  . metFORMIN (GLUCOPHAGE-XR) 500 MG 24 hr tablet Take 1 tablet (500 mg total) by mouth every morning. Take with meal for diabetes 90 tablet 0  . metoprolol succinate (TOPROL-XL) 100 MG 24 hr  tablet TAKE ONE (1) TABLET BY MOUTH TWO (2) TIMES DAILY WITH OR IMMEDIATELY FOLLOWING A MEAL 180 tablet 2  . nitroGLYCERIN (NITROSTAT) 0.4 MG SL tablet Place 1 tablet (0.4 mg total) under the tongue every 5 (five) minutes as needed for chest pain. 100 tablet 2  . warfarin (COUMADIN) 4 MG tablet Take as directed by Coumadin Clinic 90 tablet 1  . Magnesium 250 MG TABS Take 250 mg by mouth every evening.      No current facility-administered medications for this visit.     REVIEW OF SYSTEMS:   _0  denotes positive finding, _1  denotes negative finding Cardiac  Comments:  Chest pain or chest pressure:    Shortness of breath upon exertion:    Short of breath when lying flat:    Irregular heart rhythm:        Vascular    Pain in calf, thigh, or hip brought on by ambulation:    Pain in feet at night that wakes you up from your sleep:     Blood clot in your veins:    Leg swelling:         Pulmonary    Oxygen at home:    Productive cough:     Wheezing:         Neurologic    Sudden weakness in arms or legs:     Sudden numbness in arms or legs:     Sudden onset of difficulty speaking or slurred speech:    Temporary loss of vision in one eye:     Problems with dizziness:         Gastrointestinal    Blood in stool:     Vomited blood:         Genitourinary    Burning when urinating:     Blood in urine:        Psychiatric    Major depression:         Hematologic    Bleeding problems:    Problems with blood clotting too easily:        Skin    Rashes or ulcers:        Constitutional    Fever or chills:      PHYSICAL EXAM:   Vitals:   12/15/17 1124 12/15/17 1127  BP: (!) 143/70 (!) 148/70  Pulse: 64   Resp: 18   SpO2: 96%   Weight: 137 lb (62.1 kg)   Height: _2  (1.575 m)     GENERAL: The patient is a well-nourished female, in no acute distress. The vital signs are documented above. CARDIAC: There is a regular rate and rhythm.  VASCULAR: carotid incision  well  healed PULMONARY: Non-labored respirations  MUSCULOSKELETAL: There are no major deformities or cyanosis. NEUROLOGIC: No focal weakness or paresthesias are detected. SKIN: There are no ulcers or rashes noted. PSYCHIATRIC: The patient has a normal affect.  STUDIES:   I have ordered and reviewed her carotid duplex.  This shows a widely patent right carotid endarterectomy site with no evidence of stenosis, and 1-39% left carotid stenosis  MEDICAL ISSUES:   Follow-up 1 year with repeat carotid duplex    [name], MD [facility] [phone] Pager [phone]

## 2017-12-15 NOTE — Telephone Encounter (Signed)
LMOVM reminding pt to send remote transmission.   

## 2017-12-16 ENCOUNTER — Encounter: Payer: Self-pay | Admitting: Cardiology

## 2017-12-16 NOTE — Progress Notes (Signed)
Remote pacemaker transmission.   

## 2017-12-17 LAB — VAS US CAROTID
LCCADDIAS: 18 cm/s
LCCADSYS: 87 cm/s
LEFT ECA DIAS: -6 cm/s
LEFT VERTEBRAL DIAS: -9 cm/s
LICADDIAS: -14 cm/s
LICADSYS: -57 cm/s
Left CCA prox dias: 10 cm/s
Left CCA prox sys: 65 cm/s
Left ICA prox dias: -19 cm/s
Left ICA prox sys: -76 cm/s
RCCADSYS: -111 cm/s
RIGHT CCA MID DIAS: -12 cm/s
RIGHT ECA DIAS: -10 cm/s
RIGHT VERTEBRAL DIAS: -11 cm/s
Right CCA prox dias: 12 cm/s
Right CCA prox sys: 85 cm/s

## 2017-12-22 ENCOUNTER — Ambulatory Visit (INDEPENDENT_AMBULATORY_CARE_PROVIDER_SITE_OTHER): Payer: Medicare Other

## 2017-12-22 DIAGNOSIS — Z5181 Encounter for therapeutic drug level monitoring: Secondary | ICD-10-CM | POA: Diagnosis not present

## 2017-12-22 DIAGNOSIS — I4891 Unspecified atrial fibrillation: Secondary | ICD-10-CM | POA: Diagnosis not present

## 2017-12-22 LAB — POCT INR: INR: 2.1

## 2017-12-22 NOTE — Patient Instructions (Signed)
Description   Continue taking 1 tablet daily except 1/2 tablet on Tuesdays and Saturdays. Recheck INR in 6 weeks.

## 2018-01-06 LAB — CUP PACEART REMOTE DEVICE CHECK
Brady Statistic AP VS Percent: 20 %
Brady Statistic AS VS Percent: 80 %
Implantable Lead Implant Date: 20130610
Implantable Lead Location: 753860
Implantable Lead Model: 5076
Implantable Lead Model: 5092
Implantable Pulse Generator Implant Date: 20130610
Lead Channel Impedance Value: 800 Ohm
Lead Channel Pacing Threshold Amplitude: 0.625 V
Lead Channel Pacing Threshold Pulse Width: 0.4 ms
Lead Channel Pacing Threshold Pulse Width: 0.4 ms
Lead Channel Setting Sensing Sensitivity: 5.6 mV
MDC IDC LEAD IMPLANT DT: 20130610
MDC IDC LEAD LOCATION: 753859
MDC IDC MSMT BATTERY IMPEDANCE: 330 Ohm
MDC IDC MSMT BATTERY REMAINING LONGEVITY: 114 mo
MDC IDC MSMT BATTERY VOLTAGE: 2.78 V
MDC IDC MSMT LEADCHNL RA IMPEDANCE VALUE: 342 Ohm
MDC IDC MSMT LEADCHNL RA PACING THRESHOLD AMPLITUDE: 0.5 V
MDC IDC SESS DTM: 20190204200926
MDC IDC SET LEADCHNL RA PACING AMPLITUDE: 2 V
MDC IDC SET LEADCHNL RV PACING AMPLITUDE: 2.5 V
MDC IDC SET LEADCHNL RV PACING PULSEWIDTH: 0.4 ms
MDC IDC STAT BRADY AP VP PERCENT: 0 %
MDC IDC STAT BRADY AS VP PERCENT: 0 %

## 2018-01-16 DIAGNOSIS — I739 Peripheral vascular disease, unspecified: Secondary | ICD-10-CM | POA: Diagnosis not present

## 2018-01-16 DIAGNOSIS — E1151 Type 2 diabetes mellitus with diabetic peripheral angiopathy without gangrene: Secondary | ICD-10-CM | POA: Diagnosis not present

## 2018-01-16 DIAGNOSIS — L603 Nail dystrophy: Secondary | ICD-10-CM | POA: Diagnosis not present

## 2018-01-16 DIAGNOSIS — L84 Corns and callosities: Secondary | ICD-10-CM | POA: Diagnosis not present

## 2018-01-20 DIAGNOSIS — H35431 Paving stone degeneration of retina, right eye: Secondary | ICD-10-CM | POA: Diagnosis not present

## 2018-01-20 DIAGNOSIS — H35421 Microcystoid degeneration of retina, right eye: Secondary | ICD-10-CM | POA: Diagnosis not present

## 2018-01-20 DIAGNOSIS — H353211 Exudative age-related macular degeneration, right eye, with active choroidal neovascularization: Secondary | ICD-10-CM | POA: Diagnosis not present

## 2018-01-20 DIAGNOSIS — H43811 Vitreous degeneration, right eye: Secondary | ICD-10-CM | POA: Diagnosis not present

## 2018-01-22 ENCOUNTER — Ambulatory Visit (INDEPENDENT_AMBULATORY_CARE_PROVIDER_SITE_OTHER): Payer: Medicare Other | Admitting: Neurology

## 2018-01-22 ENCOUNTER — Encounter: Payer: Self-pay | Admitting: Neurology

## 2018-01-22 VITALS — Ht 62.0 in | Wt 139.0 lb

## 2018-01-22 DIAGNOSIS — G3184 Mild cognitive impairment, so stated: Secondary | ICD-10-CM

## 2018-01-22 NOTE — Progress Notes (Signed)
PATIENT: Kylie Sanchez DOB: 1925/10/14  Chief Complaint  Patient presents with  . Altered Mental Status    MMSE 22/27 (unable to complete 27-30 due to poor vision) - 8 animals.  She is here with her nephew, Barnabas Lister and caregiver, Danae Chen.  She is here to have her worsening mental status and agitation further evaluated.  Marland Kitchen PCP    Unk Pinto, MD     HISTORICAL  Kylie Sanchez is a 84 years old female, seen in refer by her primary care doctor Unk Pinto for evaluation of altered mental status, she is accompanied by her nephew Barnabas Lister and caregiver Danae Chen at today's visit, initial evaluation was on January 22, 2018.  I reviewed and summarized the referring note, she has history of diabetes, hypertension, hyperlipidemia, sick sinus syndrome, status post pacemaker, history of right endarterectomy with bovine pericardial patch angioplasty on May 13, 2017 by Dr. [name], she developed left facial droop surgically, CT angiogram showed occlusion of the right internal carotid artery, she was reoperated emergently, found a redundant portion of the artery which was resected with primary anastomosis, she made excellent recovery later.  She also has a history of atrial fibrillation, on chronic Coumadin treatment, left medial canthus basal cell carcinoma, T4aN0 M0 with widely clear margins, left eye resection in 01/21/15.  But she did not require chemo or radiation therapy,  She retired from [employer] at age 33, never married, has no children, lived with her sister entire life, her sister passed away in 2016-01-22 with severe dementia, now she lives with her brother, her nephew Barnabas Lister is her power of attorney, she has caregiver come in during the daytime, she enjoys reading, but with her poor vision, but is limited, spent most of the time watching TV, also has mild unsteady gait, using walker, she enjoys her food, sleeping well,  She was noted to have gradual onset memory loss over the past few years,  especially since 2017/01/21, she has short-term memory loss, tends to forgot appointment, repeating questions,  CT head without contrast in 2017-01-21 showed no acute abnormality, moderate supratentorium small vessel disease,  CT angiogram of neck showed status post right carotid endarterectomy, right internal carotid artery occlusion within 13 mm of bifurcation, faint reconstitution at petrous segment,  She is on chronic Coumadin treatment, she is no longer driving, her nephew Barnabas Lister is managing her finances.  REVIEW OF SYSTEMS: Full 14 system review of systems performed and notable only for memory loss, confusion, sleepiness, depression, anxiety, decreased energy, change in appetite, disinterested in activities, easy bruising, bleeding, incontinence, loss of vision  ALLERGIES: Allergies  Allergen Reactions  . No Known Allergies     HOME MEDICATIONS: Current Outpatient Medications  Medication Sig Dispense Refill  . aspirin EC 81 MG EC tablet Take 1 tablet (81 mg total) by mouth daily. 30 tablet   . atorvastatin (LIPITOR) 40 MG tablet Take 1 tablet (40 mg total) by mouth daily at 6 PM. 90 tablet 0  . b complex vitamins tablet Take 1 tablet by mouth daily.    . blood glucose meter kit and supplies Test sugars once daily. Dispense based insurance preference. E11.9 1 each 0  . Cholecalciferol (VITAMIN D PO) Take 1 tablet by mouth every evening.     . Coenzyme Q10 (CO Q 10) 100 MG CAPS Take 100 mg by mouth every evening.     . escitalopram (LEXAPRO) 10 MG tablet TAKE ONE (1) TABLET BY MOUTH EVERY DAY (Patient taking differently:  5 mg. TAKE ONE (1) TABLET BY MOUTH EVERY DAY) 90 tablet 1  . furosemide (LASIX) 20 MG tablet TAKE ONE (1) TABLET BY MOUTH EVERY DAY 90 tablet 0  . glimepiride (AMARYL) 2 MG tablet TAKE ONE (1) TABLET BY MOUTH EVERY DAY 90 tablet 0  . glucose blood test strip Check sugars once to twice a day for diabetes E11.21 150 each 4  . Magnesium 250 MG TABS Take 250 mg by mouth every evening.      . metFORMIN (GLUCOPHAGE-XR) 500 MG 24 hr tablet Take 1 tablet (500 mg total) by mouth every morning. Take with meal for diabetes 90 tablet 0  . metoprolol succinate (TOPROL-XL) 100 MG 24 hr tablet TAKE ONE (1) TABLET BY MOUTH TWO (2) TIMES DAILY WITH OR IMMEDIATELY FOLLOWING A MEAL 180 tablet 2  . nitroGLYCERIN (NITROSTAT) 0.4 MG SL tablet Place 1 tablet (0.4 mg total) under the tongue every 5 (five) minutes as needed for chest pain. 100 tablet 2  . warfarin (COUMADIN) 4 MG tablet Take as directed by [facility] 90 tablet 1   No current facility-administered medications for this visit.     PAST MEDICAL HISTORY: Past Medical History:  Diagnosis Date  . Cancer ([place]) 2015   left eye  . Cataract   . CKD (chronic kidney disease) stage 3, GFR 30-59 ml/min (HCC)    due to DM  . Dizziness   . Hyperlipidemia   . Hypertension   . Hypertensive cardiovascular disease   . Impaired vision   . Memory loss   . Paroxysmal atrial fibrillation (HCC)   . Presence of permanent cardiac pacemaker   . Retinopathy   . Sick sinus syndrome ([place])   . Type II or unspecified type diabetes mellitus without mention of complication, not stated as uncontrolled     PAST SURGICAL HISTORY: Past Surgical History:  Procedure Laterality Date  . DENTAL SURGERY    . ENDARTERECTOMY Right 05/13/2017   Procedure: REDO ENDARTERECTOMY CAROTID Resection Redundant Internal Corotid Artery.;  Surgeon: Angelia Mould, MD;  Location: [place];  Service: Vascular;  Laterality: Right;  . ENDARTERECTOMY Right 05/13/2017   Procedure: ENDARTERECTOMY CAROTID-RIGHT;  Surgeon: Serafina Mitchell, MD;  Location[facility] OR;  Service: Vascular;  Laterality: Right;  . EYE SURGERY Left    tumor and eye removed  . PACEMAKER INSERTION  04/20/12   MDT Adapta L implanted by Dr Rayann Heman  . PATCH ANGIOPLASTY Right 05/13/2017   Procedure: PATCH ANGIOPLASTY USING Rueben Bash BIOLOGIC PATCH;  Surgeon: Serafina Mitchell, MD;  Location: [facility] OR;  Service:  Vascular;  Laterality: Right;  . PERMANENT PACEMAKER INSERTION N/A 04/20/2012   Procedure: PERMANENT PACEMAKER INSERTION;  Surgeon: Thompson Grayer, MD;  Location: [facility] CATH LAB;  Service: Cardiovascular;  Laterality: N/A;    FAMILY HISTORY: Family History  Problem Relation Age of Onset  . Heart attack Father   . Heart disease Father   . Hypertension Mother   . Heart disease Mother   . Cancer Other     SOCIAL HISTORY:  Social History   Socioeconomic History  . Marital status: Single    Spouse name: Not on file  . Number of children: 0  . Years of education: 11 years  . Highest education level: High school graduate  Social Needs  . Financial resource strain: Not on file  . Food insecurity - worry: Not on file  . Food insecurity - inability: Not on file  . Transportation needs - medical: Not on  file  . Transportation needs - non-medical: Not on file  Occupational History  . Occupation: Retired  Tobacco Use  . Smoking status: Never Smoker  . Smokeless tobacco: Never Used  Substance and Sexual Activity  . Alcohol use: No  . Drug use: No  . Sexual activity: Not on file  Other Topics Concern  . Not on file  Social History Narrative   Lives at home with her brother.  She has a caregiver in the home from 9am to 6:30pm.   Left-handed.   2 cups caffeine per day.     PHYSICAL EXAM   Vitals:   01/22/18 0939  Weight: 139 lb (63 kg)  Height: _0  (1.575 m)    Not recorded      Body mass index is 25.42 kg/m.  PHYSICAL EXAMNIATION:  Gen: NAD, conversant, well nourised, obese, well groomed                     Cardiovascular: Regular rate rhythm, no peripheral edema, warm, nontender. Eyes: Conjunctivae clear without exudates or hemorrhage Neck: Supple, no carotid bruits. Pulmonary: Clear to auscultation bilaterally   NEUROLOGICAL EXAM:  MENTAL STATUS: MMSE - Mini Mental State Exam 01/22/2018  Orientation to time 2  Orientation to Place 5  Registration 3    Attention/ Calculation 5  Recall 0  Language- name 2 objects 2  Language- repeat 1  Language- follow 3 step command 3  Language- read & follow direction 1  Write a sentence 1  Copy design 1  Total score 24   Animal naming 8   CRANIAL NERVES: CN II: Visual fields are full to confrontation. Fundoscopic exam is normal with sharp discs and no vascular changes. Pupils are round equal and briskly reactive to light. CN III, IV, VI: extraocular movement are normal. No ptosis. CN V: Facial sensation is intact to pinprick in all 3 divisions bilaterally. Corneal responses are intact.  CN VII: Face is symmetric with normal eye closure and smile. CN VIII: Hearing is normal to rubbing fingers CN IX, X: Palate elevates symmetrically. Phonation is normal. CN XI: Head turning and shoulder shrug are intact CN XII: Tongue is midline with normal movements and no atrophy.  MOTOR: There is no pronator drift of out-stretched arms. Muscle bulk and tone are normal. Muscle strength is normal.  REFLEXES: Reflexes are 2+ and symmetric at the biceps, triceps, knees, and ankles. Plantar responses are flexor.  SENSORY: Intact to light touch, pinprick, positional sensation and vibratory sensation are intact in fingers and toes.  COORDINATION: Rapid alternating movements and fine finger movements are intact. There is no dysmetria on finger-to-nose and heel-knee-shin.    GAIT/STANCE: She needs pushed up to get up from seated position, wide-based, unsteady position.   DIAGNOSTIC DATA (LABS, IMAGING, TESTING) - I reviewed patient records, labs, notes, testing and imaging myself where available.   ASSESSMENT AND PLAN  Hazley Dezeeuw is a 84 y.o. female   Mild cognitive impairment Right internal carotid artery occlusion,  chronic atrial fibrillation, on chronic tendon treatment  Her mild cognitive impairment are likely combination of central nervous system degenerative disorder, previous stroke,  cerebrovascular disease,  We have discussed with family about the potential medication treatment, decided to hold off Namenda or Aricept at this point,    Laboratory evaluation in February 2019 showed INR 2.1, A1c 6.7, normal TSH, CBC, decreased  GFR 32   Vitamin B12 level to rule out treatable cause for memory loss,  Have  encouraged her moderate exercise, increase water intake   Marcial Pacas, M.D. Ph.D.  [facility] [address] [place], [place] [address] Ph: [phone] Fax: [phone]  CC: Unk Pinto, MD

## 2018-01-23 LAB — VITAMIN B12: VITAMIN B 12: 571 pg/mL (ref 232–1245)

## 2018-01-26 ENCOUNTER — Telehealth: Payer: Self-pay | Admitting: *Deleted

## 2018-01-26 NOTE — Telephone Encounter (Signed)
-----   Message from Marcial Pacas, MD sent at 01/26/2018 10:07 AM EDT ----- Please call patient for normal B12 level

## 2018-01-26 NOTE — Telephone Encounter (Signed)
Spoke to her niece, Esperanza Madrazo on HIPAA - she is aware of results.

## 2018-02-04 ENCOUNTER — Ambulatory Visit (INDEPENDENT_AMBULATORY_CARE_PROVIDER_SITE_OTHER): Payer: Medicare Other | Admitting: *Deleted

## 2018-02-04 DIAGNOSIS — Z5181 Encounter for therapeutic drug level monitoring: Secondary | ICD-10-CM | POA: Diagnosis not present

## 2018-02-04 DIAGNOSIS — I4891 Unspecified atrial fibrillation: Secondary | ICD-10-CM | POA: Diagnosis not present

## 2018-02-04 LAB — POCT INR: INR: 2.8

## 2018-02-04 NOTE — Patient Instructions (Signed)
Description   Continue taking 1 tablet daily except 1/2 tablet on Tuesdays and Saturdays. Recheck INR in 6 weeks.

## 2018-02-23 ENCOUNTER — Other Ambulatory Visit: Payer: Self-pay | Admitting: Internal Medicine

## 2018-03-16 ENCOUNTER — Ambulatory Visit (INDEPENDENT_AMBULATORY_CARE_PROVIDER_SITE_OTHER): Payer: Medicare Other | Admitting: *Deleted

## 2018-03-16 DIAGNOSIS — I495 Sick sinus syndrome: Secondary | ICD-10-CM

## 2018-03-17 NOTE — Progress Notes (Signed)
Remote pacemaker transmission.   

## 2018-03-18 ENCOUNTER — Encounter: Payer: Self-pay | Admitting: Cardiology

## 2018-03-18 ENCOUNTER — Ambulatory Visit (INDEPENDENT_AMBULATORY_CARE_PROVIDER_SITE_OTHER): Payer: Medicare Other | Admitting: *Deleted

## 2018-03-18 DIAGNOSIS — Z5181 Encounter for therapeutic drug level monitoring: Secondary | ICD-10-CM | POA: Diagnosis not present

## 2018-03-18 DIAGNOSIS — I4891 Unspecified atrial fibrillation: Secondary | ICD-10-CM | POA: Diagnosis not present

## 2018-03-18 LAB — POCT INR: INR: 2

## 2018-03-18 NOTE — Patient Instructions (Signed)
Description   Today take 1.5 tablets then continue taking 1 tablet daily except 1/2 tablet on Tuesdays and Saturdays. Recheck INR in 6 weeks.

## 2018-03-21 ENCOUNTER — Inpatient Hospital Stay (HOSPITAL_COMMUNITY)
Admission: EM | Admit: 2018-03-21 | Discharge: 2018-03-27 | DRG: 480 | Disposition: A | Discharge status: Skilled Nursing Facility | Payer: Medicare Other | Attending: Internal Medicine | Admitting: Internal Medicine

## 2018-03-21 ENCOUNTER — Encounter (HOSPITAL_COMMUNITY): Payer: Self-pay

## 2018-03-21 DIAGNOSIS — D6832 Hemorrhagic disorder due to extrinsic circulating anticoagulants: Secondary | ICD-10-CM | POA: Diagnosis present

## 2018-03-21 DIAGNOSIS — W1830XA Fall on same level, unspecified, initial encounter: Secondary | ICD-10-CM | POA: Diagnosis present

## 2018-03-21 DIAGNOSIS — E11319 Type 2 diabetes mellitus with unspecified diabetic retinopathy without macular edema: Secondary | ICD-10-CM | POA: Diagnosis present

## 2018-03-21 DIAGNOSIS — R571 Hypovolemic shock: Secondary | ICD-10-CM | POA: Diagnosis present

## 2018-03-21 DIAGNOSIS — I130 Hypertensive heart and chronic kidney disease with heart failure and stage 1 through stage 4 chronic kidney disease, or unspecified chronic kidney disease: Secondary | ICD-10-CM | POA: Diagnosis not present

## 2018-03-21 DIAGNOSIS — Z950 Presence of cardiac pacemaker: Secondary | ICD-10-CM

## 2018-03-21 DIAGNOSIS — Z7982 Long term (current) use of aspirin: Secondary | ICD-10-CM

## 2018-03-21 DIAGNOSIS — S72141A Displaced intertrochanteric fracture of right femur, initial encounter for closed fracture: Principal | ICD-10-CM | POA: Diagnosis present

## 2018-03-21 DIAGNOSIS — F329 Major depressive disorder, single episode, unspecified: Secondary | ICD-10-CM | POA: Diagnosis present

## 2018-03-21 DIAGNOSIS — R443 Hallucinations, unspecified: Secondary | ICD-10-CM | POA: Diagnosis present

## 2018-03-21 DIAGNOSIS — B961 Klebsiella pneumoniae [K. pneumoniae] as the cause of diseases classified elsewhere: Secondary | ICD-10-CM | POA: Diagnosis present

## 2018-03-21 DIAGNOSIS — I9589 Other hypotension: Secondary | ICD-10-CM | POA: Diagnosis present

## 2018-03-21 DIAGNOSIS — T148XXA Other injury of unspecified body region, initial encounter: Secondary | ICD-10-CM | POA: Diagnosis not present

## 2018-03-21 DIAGNOSIS — D62 Acute posthemorrhagic anemia: Secondary | ICD-10-CM | POA: Diagnosis not present

## 2018-03-21 DIAGNOSIS — S7291XA Unspecified fracture of right femur, initial encounter for closed fracture: Secondary | ICD-10-CM | POA: Diagnosis not present

## 2018-03-21 DIAGNOSIS — Z79899 Other long term (current) drug therapy: Secondary | ICD-10-CM

## 2018-03-21 DIAGNOSIS — N390 Urinary tract infection, site not specified: Secondary | ICD-10-CM | POA: Diagnosis present

## 2018-03-21 DIAGNOSIS — I481 Persistent atrial fibrillation: Secondary | ICD-10-CM | POA: Diagnosis present

## 2018-03-21 DIAGNOSIS — Y92009 Unspecified place in unspecified non-institutional (private) residence as the place of occurrence of the external cause: Secondary | ICD-10-CM

## 2018-03-21 DIAGNOSIS — H547 Unspecified visual loss: Secondary | ICD-10-CM | POA: Diagnosis present

## 2018-03-21 DIAGNOSIS — I480 Paroxysmal atrial fibrillation: Secondary | ICD-10-CM | POA: Diagnosis present

## 2018-03-21 DIAGNOSIS — Z85840 Personal history of malignant neoplasm of eye: Secondary | ICD-10-CM

## 2018-03-21 DIAGNOSIS — I739 Peripheral vascular disease, unspecified: Secondary | ICD-10-CM | POA: Diagnosis present

## 2018-03-21 DIAGNOSIS — S72009A Fracture of unspecified part of neck of unspecified femur, initial encounter for closed fracture: Secondary | ICD-10-CM | POA: Diagnosis present

## 2018-03-21 DIAGNOSIS — Z9001 Acquired absence of eye: Secondary | ICD-10-CM

## 2018-03-21 DIAGNOSIS — T45515A Adverse effect of anticoagulants, initial encounter: Secondary | ICD-10-CM | POA: Diagnosis present

## 2018-03-21 DIAGNOSIS — E1122 Type 2 diabetes mellitus with diabetic chronic kidney disease: Secondary | ICD-10-CM | POA: Diagnosis present

## 2018-03-21 DIAGNOSIS — F419 Anxiety disorder, unspecified: Secondary | ICD-10-CM | POA: Diagnosis present

## 2018-03-21 DIAGNOSIS — S72441P Displaced fracture of lower epiphysis (separation) of right femur, subsequent encounter for closed fracture with malunion: Secondary | ICD-10-CM

## 2018-03-21 DIAGNOSIS — I4819 Other persistent atrial fibrillation: Secondary | ICD-10-CM | POA: Diagnosis present

## 2018-03-21 DIAGNOSIS — N3000 Acute cystitis without hematuria: Secondary | ICD-10-CM | POA: Diagnosis present

## 2018-03-21 DIAGNOSIS — I700 Atherosclerosis of aorta: Secondary | ICD-10-CM | POA: Diagnosis present

## 2018-03-21 DIAGNOSIS — I482 Chronic atrial fibrillation: Secondary | ICD-10-CM | POA: Diagnosis present

## 2018-03-21 DIAGNOSIS — I2510 Atherosclerotic heart disease of native coronary artery without angina pectoris: Secondary | ICD-10-CM | POA: Diagnosis present

## 2018-03-21 DIAGNOSIS — Z66 Do not resuscitate: Secondary | ICD-10-CM | POA: Diagnosis present

## 2018-03-21 DIAGNOSIS — M25561 Pain in right knee: Secondary | ICD-10-CM | POA: Diagnosis not present

## 2018-03-21 DIAGNOSIS — I5022 Chronic systolic (congestive) heart failure: Secondary | ICD-10-CM | POA: Diagnosis present

## 2018-03-21 DIAGNOSIS — I10 Essential (primary) hypertension: Secondary | ICD-10-CM | POA: Diagnosis not present

## 2018-03-21 DIAGNOSIS — Z01811 Encounter for preprocedural respiratory examination: Secondary | ICD-10-CM

## 2018-03-21 DIAGNOSIS — E785 Hyperlipidemia, unspecified: Secondary | ICD-10-CM | POA: Diagnosis present

## 2018-03-21 DIAGNOSIS — Z7984 Long term (current) use of oral hypoglycemic drugs: Secondary | ICD-10-CM

## 2018-03-21 DIAGNOSIS — I429 Cardiomyopathy, unspecified: Secondary | ICD-10-CM | POA: Diagnosis present

## 2018-03-21 DIAGNOSIS — E1169 Type 2 diabetes mellitus with other specified complication: Secondary | ICD-10-CM | POA: Diagnosis present

## 2018-03-21 DIAGNOSIS — N184 Chronic kidney disease, stage 4 (severe): Secondary | ICD-10-CM | POA: Diagnosis present

## 2018-03-21 DIAGNOSIS — E1151 Type 2 diabetes mellitus with diabetic peripheral angiopathy without gangrene: Secondary | ICD-10-CM | POA: Diagnosis present

## 2018-03-21 DIAGNOSIS — S7221XA Displaced subtrochanteric fracture of right femur, initial encounter for closed fracture: Secondary | ICD-10-CM | POA: Diagnosis present

## 2018-03-21 DIAGNOSIS — S0003XA Contusion of scalp, initial encounter: Secondary | ICD-10-CM | POA: Diagnosis present

## 2018-03-21 DIAGNOSIS — Z7901 Long term (current) use of anticoagulants: Secondary | ICD-10-CM

## 2018-03-21 DIAGNOSIS — S72001A Fracture of unspecified part of neck of right femur, initial encounter for closed fracture: Secondary | ICD-10-CM | POA: Diagnosis present

## 2018-03-21 DIAGNOSIS — S72142A Displaced intertrochanteric fracture of left femur, initial encounter for closed fracture: Secondary | ICD-10-CM | POA: Diagnosis not present

## 2018-03-21 DIAGNOSIS — R402414 Glasgow coma scale score 13-15, 24 hours or more after hospital admission: Secondary | ICD-10-CM | POA: Diagnosis not present

## 2018-03-21 NOTE — ED Triage Notes (Signed)
Pt. Via EMS after witnessed fall  While ambulating with walker at home. Pt. Reports hitting head but no LOC. Per EMS pt has shortening of right leg. Strong pulses and intact sensation.  Pt. Takes warfarin at home. Pt. Given 200 fentanyl en route. CBG 200. A/O X4.

## 2018-03-22 ENCOUNTER — Emergency Department (HOSPITAL_COMMUNITY): Payer: Medicare Other

## 2018-03-22 ENCOUNTER — Other Ambulatory Visit: Payer: Self-pay

## 2018-03-22 ENCOUNTER — Inpatient Hospital Stay (HOSPITAL_COMMUNITY): Payer: Medicare Other

## 2018-03-22 DIAGNOSIS — I700 Atherosclerosis of aorta: Secondary | ICD-10-CM | POA: Diagnosis present

## 2018-03-22 DIAGNOSIS — R2681 Unsteadiness on feet: Secondary | ICD-10-CM | POA: Diagnosis not present

## 2018-03-22 DIAGNOSIS — S72001A Fracture of unspecified part of neck of right femur, initial encounter for closed fracture: Secondary | ICD-10-CM | POA: Insufficient documentation

## 2018-03-22 DIAGNOSIS — I739 Peripheral vascular disease, unspecified: Secondary | ICD-10-CM | POA: Diagnosis present

## 2018-03-22 DIAGNOSIS — E1151 Type 2 diabetes mellitus with diabetic peripheral angiopathy without gangrene: Secondary | ICD-10-CM | POA: Diagnosis present

## 2018-03-22 DIAGNOSIS — S72141A Displaced intertrochanteric fracture of right femur, initial encounter for closed fracture: Secondary | ICD-10-CM | POA: Diagnosis present

## 2018-03-22 DIAGNOSIS — E1122 Type 2 diabetes mellitus with diabetic chronic kidney disease: Secondary | ICD-10-CM | POA: Diagnosis not present

## 2018-03-22 DIAGNOSIS — N183 Chronic kidney disease, stage 3 (moderate): Secondary | ICD-10-CM | POA: Diagnosis not present

## 2018-03-22 DIAGNOSIS — D62 Acute posthemorrhagic anemia: Secondary | ICD-10-CM | POA: Diagnosis not present

## 2018-03-22 DIAGNOSIS — N3000 Acute cystitis without hematuria: Secondary | ICD-10-CM | POA: Diagnosis not present

## 2018-03-22 DIAGNOSIS — I482 Chronic atrial fibrillation: Secondary | ICD-10-CM | POA: Diagnosis present

## 2018-03-22 DIAGNOSIS — I9589 Other hypotension: Secondary | ICD-10-CM | POA: Diagnosis present

## 2018-03-22 DIAGNOSIS — S7221XA Displaced subtrochanteric fracture of right femur, initial encounter for closed fracture: Secondary | ICD-10-CM | POA: Diagnosis not present

## 2018-03-22 DIAGNOSIS — S72441P Displaced fracture of lower epiphysis (separation) of right femur, subsequent encounter for closed fracture with malunion: Secondary | ICD-10-CM | POA: Diagnosis not present

## 2018-03-22 DIAGNOSIS — I129 Hypertensive chronic kidney disease with stage 1 through stage 4 chronic kidney disease, or unspecified chronic kidney disease: Secondary | ICD-10-CM | POA: Diagnosis not present

## 2018-03-22 DIAGNOSIS — R571 Hypovolemic shock: Secondary | ICD-10-CM | POA: Diagnosis present

## 2018-03-22 DIAGNOSIS — I959 Hypotension, unspecified: Secondary | ICD-10-CM

## 2018-03-22 DIAGNOSIS — R278 Other lack of coordination: Secondary | ICD-10-CM | POA: Diagnosis not present

## 2018-03-22 DIAGNOSIS — N390 Urinary tract infection, site not specified: Secondary | ICD-10-CM | POA: Diagnosis not present

## 2018-03-22 DIAGNOSIS — H547 Unspecified visual loss: Secondary | ICD-10-CM | POA: Diagnosis present

## 2018-03-22 DIAGNOSIS — R443 Hallucinations, unspecified: Secondary | ICD-10-CM | POA: Diagnosis present

## 2018-03-22 DIAGNOSIS — Z7401 Bed confinement status: Secondary | ICD-10-CM | POA: Diagnosis not present

## 2018-03-22 DIAGNOSIS — E1169 Type 2 diabetes mellitus with other specified complication: Secondary | ICD-10-CM | POA: Diagnosis not present

## 2018-03-22 DIAGNOSIS — I480 Paroxysmal atrial fibrillation: Secondary | ICD-10-CM | POA: Diagnosis not present

## 2018-03-22 DIAGNOSIS — W1830XA Fall on same level, unspecified, initial encounter: Secondary | ICD-10-CM | POA: Diagnosis present

## 2018-03-22 DIAGNOSIS — Y92009 Unspecified place in unspecified non-institutional (private) residence as the place of occurrence of the external cause: Secondary | ICD-10-CM | POA: Diagnosis not present

## 2018-03-22 DIAGNOSIS — I481 Persistent atrial fibrillation: Secondary | ICD-10-CM | POA: Diagnosis not present

## 2018-03-22 DIAGNOSIS — T45515A Adverse effect of anticoagulants, initial encounter: Secondary | ICD-10-CM | POA: Diagnosis not present

## 2018-03-22 DIAGNOSIS — A419 Sepsis, unspecified organism: Secondary | ICD-10-CM | POA: Diagnosis not present

## 2018-03-22 DIAGNOSIS — Z66 Do not resuscitate: Secondary | ICD-10-CM | POA: Diagnosis present

## 2018-03-22 DIAGNOSIS — S7291XA Unspecified fracture of right femur, initial encounter for closed fracture: Secondary | ICD-10-CM | POA: Diagnosis not present

## 2018-03-22 DIAGNOSIS — S72001D Fracture of unspecified part of neck of right femur, subsequent encounter for closed fracture with routine healing: Secondary | ICD-10-CM | POA: Diagnosis not present

## 2018-03-22 DIAGNOSIS — R41841 Cognitive communication deficit: Secondary | ICD-10-CM | POA: Diagnosis not present

## 2018-03-22 DIAGNOSIS — M79604 Pain in right leg: Secondary | ICD-10-CM | POA: Diagnosis not present

## 2018-03-22 DIAGNOSIS — M2550 Pain in unspecified joint: Secondary | ICD-10-CM | POA: Diagnosis not present

## 2018-03-22 DIAGNOSIS — D6832 Hemorrhagic disorder due to extrinsic circulating anticoagulants: Secondary | ICD-10-CM

## 2018-03-22 DIAGNOSIS — B961 Klebsiella pneumoniae [K. pneumoniae] as the cause of diseases classified elsewhere: Secondary | ICD-10-CM | POA: Diagnosis present

## 2018-03-22 DIAGNOSIS — E11319 Type 2 diabetes mellitus with unspecified diabetic retinopathy without macular edema: Secondary | ICD-10-CM | POA: Diagnosis present

## 2018-03-22 DIAGNOSIS — I130 Hypertensive heart and chronic kidney disease with heart failure and stage 1 through stage 4 chronic kidney disease, or unspecified chronic kidney disease: Secondary | ICD-10-CM | POA: Diagnosis present

## 2018-03-22 DIAGNOSIS — I2510 Atherosclerotic heart disease of native coronary artery without angina pectoris: Secondary | ICD-10-CM | POA: Diagnosis present

## 2018-03-22 DIAGNOSIS — M6281 Muscle weakness (generalized): Secondary | ICD-10-CM | POA: Diagnosis not present

## 2018-03-22 DIAGNOSIS — S72142A Displaced intertrochanteric fracture of left femur, initial encounter for closed fracture: Secondary | ICD-10-CM | POA: Diagnosis not present

## 2018-03-22 DIAGNOSIS — Z01810 Encounter for preprocedural cardiovascular examination: Secondary | ICD-10-CM | POA: Diagnosis not present

## 2018-03-22 DIAGNOSIS — S72009A Fracture of unspecified part of neck of unspecified femur, initial encounter for closed fracture: Secondary | ICD-10-CM | POA: Diagnosis present

## 2018-03-22 DIAGNOSIS — M6389 Disorders of muscle in diseases classified elsewhere, multiple sites: Secondary | ICD-10-CM | POA: Diagnosis not present

## 2018-03-22 DIAGNOSIS — E785 Hyperlipidemia, unspecified: Secondary | ICD-10-CM | POA: Diagnosis not present

## 2018-03-22 DIAGNOSIS — Z01811 Encounter for preprocedural respiratory examination: Secondary | ICD-10-CM | POA: Diagnosis not present

## 2018-03-22 DIAGNOSIS — I5022 Chronic systolic (congestive) heart failure: Secondary | ICD-10-CM | POA: Diagnosis present

## 2018-03-22 DIAGNOSIS — N184 Chronic kidney disease, stage 4 (severe): Secondary | ICD-10-CM | POA: Diagnosis present

## 2018-03-22 DIAGNOSIS — E7849 Other hyperlipidemia: Secondary | ICD-10-CM | POA: Diagnosis not present

## 2018-03-22 DIAGNOSIS — S0990XA Unspecified injury of head, initial encounter: Secondary | ICD-10-CM | POA: Diagnosis not present

## 2018-03-22 DIAGNOSIS — I429 Cardiomyopathy, unspecified: Secondary | ICD-10-CM | POA: Diagnosis present

## 2018-03-22 LAB — CBC
HCT: 29.3 % — ABNORMAL LOW (ref 36.0–46.0)
HCT: 31.7 % — ABNORMAL LOW (ref 36.0–46.0)
HEMATOCRIT: 24.9 % — AB (ref 36.0–46.0)
HEMOGLOBIN: 9.3 g/dL — AB (ref 12.0–15.0)
Hemoglobin: 7.9 g/dL — ABNORMAL LOW (ref 12.0–15.0)
Hemoglobin: 10.5 g/dL — ABNORMAL LOW (ref 12.0–15.0)
MCH: 29.7 pg (ref 26.0–34.0)
MCH: 29.8 pg (ref 26.0–34.0)
MCH: 29.6 pg (ref 26.0–34.0)
MCHC: 31.7 g/dL (ref 30.0–36.0)
MCHC: 31.7 g/dL (ref 30.0–36.0)
MCHC: 33.1 g/dL (ref 30.0–36.0)
MCV: 93.6 fL (ref 78.0–100.0)
MCV: 94 fL (ref 78.0–100.0)
MCV: 89.3 fL (ref 78.0–100.0)
PLATELETS: 157 10*3/uL (ref 150–400)
Platelets: 255 10*3/uL (ref 150–400)
Platelets: 239 10*3/uL (ref 150–400)
RBC: 3.13 MIL/uL — ABNORMAL LOW (ref 3.87–5.11)
RBC: 2.65 MIL/uL — AB (ref 3.87–5.11)
RBC: 3.55 MIL/uL — ABNORMAL LOW (ref 3.87–5.11)
RDW: 13.5 % (ref 11.5–15.5)
RDW: 13.7 % (ref 11.5–15.5)
RDW: 15.9 % — AB (ref 11.5–15.5)
WBC: 16.8 10*3/uL — ABNORMAL HIGH (ref 4.0–10.5)
WBC: 12.1 10*3/uL — ABNORMAL HIGH (ref 4.0–10.5)
WBC: 10.5 10*3/uL (ref 4.0–10.5)

## 2018-03-22 LAB — TROPONIN I: TROPONIN I: 0.05 ng/mL — AB (ref <0.03)

## 2018-03-22 LAB — CBC WITH DIFFERENTIAL/PLATELET
Basophils Absolute: 0 10*3/uL (ref 0.0–0.1)
Basophils Relative: 0 %
Eosinophils Absolute: 0.3 10*3/uL (ref 0.0–0.7)
Eosinophils Relative: 1 %
HEMATOCRIT: 33.8 % — AB (ref 36.0–46.0)
HEMOGLOBIN: 11 g/dL — AB (ref 12.0–15.0)
LYMPHS ABS: 2.1 10*3/uL (ref 0.7–4.0)
Lymphocytes Relative: 12 %
MCH: 30.1 pg (ref 26.0–34.0)
MCHC: 32.5 g/dL (ref 30.0–36.0)
MCV: 92.6 fL (ref 78.0–100.0)
MONOS PCT: 4 %
Monocytes Absolute: 0.7 10*3/uL (ref 0.1–1.0)
NEUTROS PCT: 83 %
Neutro Abs: 14.6 10*3/uL — ABNORMAL HIGH (ref 1.7–7.7)
Platelets: 278 10*3/uL (ref 150–400)
RBC: 3.65 MIL/uL — ABNORMAL LOW (ref 3.87–5.11)
RDW: 13.4 % (ref 11.5–15.5)
WBC: 17.7 10*3/uL — ABNORMAL HIGH (ref 4.0–10.5)

## 2018-03-22 LAB — GLUCOSE, CAPILLARY: Glucose-Capillary: 178 mg/dL — ABNORMAL HIGH (ref 65–99)

## 2018-03-22 LAB — URINALYSIS, ROUTINE W REFLEX MICROSCOPIC
BILIRUBIN URINE: NEGATIVE
Glucose, UA: 50 mg/dL — AB
Hgb urine dipstick: NEGATIVE
KETONES UR: NEGATIVE mg/dL
NITRITE: POSITIVE — AB
PH: 7 (ref 5.0–8.0)
Protein, ur: NEGATIVE mg/dL
Specific Gravity, Urine: 1.014 (ref 1.005–1.030)

## 2018-03-22 LAB — BASIC METABOLIC PANEL
Anion gap: 10 (ref 5–15)
Anion gap: 7 (ref 5–15)
BUN: 44 mg/dL — ABNORMAL HIGH (ref 6–20)
BUN: 40 mg/dL — AB (ref 6–20)
CALCIUM: 8.4 mg/dL — AB (ref 8.9–10.3)
CHLORIDE: 109 mmol/L (ref 101–111)
CO2: 21 mmol/L — ABNORMAL LOW (ref 22–32)
CO2: 25 mmol/L (ref 22–32)
CREATININE: 1.49 mg/dL — AB (ref 0.44–1.00)
Calcium: 8.5 mg/dL — ABNORMAL LOW (ref 8.9–10.3)
Chloride: 111 mmol/L (ref 101–111)
Creatinine, Ser: 1.51 mg/dL — ABNORMAL HIGH (ref 0.44–1.00)
GFR calc Af Amer: 34 mL/min — ABNORMAL LOW (ref >60)
GFR calc non Af Amer: 30 mL/min — ABNORMAL LOW (ref >60)
GFR calc non Af Amer: 29 mL/min — ABNORMAL LOW (ref >60)
GFR, EST AFRICAN AMERICAN: 34 mL/min — AB (ref >60)
GLUCOSE: 231 mg/dL — AB (ref 65–99)
GLUCOSE: 174 mg/dL — AB (ref 65–99)
Potassium: 5.2 mmol/L — ABNORMAL HIGH (ref 3.5–5.1)
Potassium: 5 mmol/L (ref 3.5–5.1)
SODIUM: 140 mmol/L (ref 135–145)
Sodium: 143 mmol/L (ref 135–145)

## 2018-03-22 LAB — PROTIME-INR
INR: 2.92
INR: 3.17
INR: 1.57
Prothrombin Time: 30.3 seconds — ABNORMAL HIGH (ref 11.4–15.2)
Prothrombin Time: 32.3 seconds — ABNORMAL HIGH (ref 11.4–15.2)
Prothrombin Time: 18.6 seconds — ABNORMAL HIGH (ref 11.4–15.2)

## 2018-03-22 LAB — COMPREHENSIVE METABOLIC PANEL
ALT: 26 U/L (ref 14–54)
ANION GAP: 11 (ref 5–15)
AST: 25 U/L (ref 15–41)
Albumin: 3.2 g/dL — ABNORMAL LOW (ref 3.5–5.0)
Alkaline Phosphatase: 76 U/L (ref 38–126)
BUN: 45 mg/dL — ABNORMAL HIGH (ref 6–20)
CHLORIDE: 104 mmol/L (ref 101–111)
CO2: 24 mmol/L (ref 22–32)
Calcium: 9 mg/dL (ref 8.9–10.3)
Creatinine, Ser: 1.37 mg/dL — ABNORMAL HIGH (ref 0.44–1.00)
GFR calc non Af Amer: 33 mL/min — ABNORMAL LOW (ref >60)
GFR, EST AFRICAN AMERICAN: 38 mL/min — AB (ref >60)
Glucose, Bld: 234 mg/dL — ABNORMAL HIGH (ref 65–99)
Potassium: 4.8 mmol/L (ref 3.5–5.1)
Sodium: 139 mmol/L (ref 135–145)
Total Bilirubin: 0.4 mg/dL (ref 0.3–1.2)
Total Protein: 6.3 g/dL — ABNORMAL LOW (ref 6.5–8.1)

## 2018-03-22 LAB — PREPARE RBC (CROSSMATCH)

## 2018-03-22 LAB — CBG MONITORING, ED
Glucose-Capillary: 232 mg/dL — ABNORMAL HIGH (ref 65–99)
Glucose-Capillary: 140 mg/dL — ABNORMAL HIGH (ref 65–99)
Glucose-Capillary: 147 mg/dL — ABNORMAL HIGH (ref 65–99)

## 2018-03-22 LAB — HEMOGLOBIN AND HEMATOCRIT, BLOOD
HCT: 26.2 % — ABNORMAL LOW (ref 36.0–46.0)
Hemoglobin: 8.3 g/dL — ABNORMAL LOW (ref 12.0–15.0)

## 2018-03-22 LAB — APTT: aPTT: 32 seconds (ref 24–36)

## 2018-03-22 MED ORDER — INSULIN ASPART 100 UNIT/ML ~~LOC~~ SOLN
0.0000 [IU] | Freq: Three times a day (TID) | SUBCUTANEOUS | Status: DC
  Administered 2018-03-22: 5 [IU] via SUBCUTANEOUS
  Administered 2018-03-24 (×2): 3 [IU] via SUBCUTANEOUS
  Administered 2018-03-24: 8 [IU] via SUBCUTANEOUS
  Administered 2018-03-25: 3 [IU] via SUBCUTANEOUS
  Administered 2018-03-25: 2 [IU] via SUBCUTANEOUS
  Administered 2018-03-25: 3 [IU] via SUBCUTANEOUS
  Administered 2018-03-26 – 2018-03-27 (×2): 2 [IU] via SUBCUTANEOUS
  Filled 2018-03-22: qty 1

## 2018-03-22 MED ORDER — ATORVASTATIN CALCIUM 40 MG PO TABS
40.0000 mg | ORAL_TABLET | Freq: Every day | ORAL | Status: DC
  Administered 2018-03-24 – 2018-03-26 (×3): 40 mg via ORAL
  Filled 2018-03-22 (×4): qty 1

## 2018-03-22 MED ORDER — SODIUM CHLORIDE 0.9 % IV SOLN: Freq: Once | INTRAVENOUS | Status: DC

## 2018-03-22 MED ORDER — CO Q 10 100 MG PO CAPS
100.0000 mg | ORAL_CAPSULE | Freq: Every evening | ORAL | Status: DC
Start: 2018-03-22 — End: 2018-03-22

## 2018-03-22 MED ORDER — SODIUM CHLORIDE 0.9 % IV SOLN
1.0000 g | INTRAVENOUS | Status: DC
  Administered 2018-03-22 – 2018-03-27 (×6): 1 g via INTRAVENOUS
  Filled 2018-03-22 (×6): qty 10

## 2018-03-22 MED ORDER — CYCLOBENZAPRINE HCL 10 MG PO TABS
5.0000 mg | ORAL_TABLET | Freq: Two times a day (BID) | ORAL | Status: DC | PRN
  Administered 2018-03-22 – 2018-03-26 (×4): 5 mg via ORAL
  Filled 2018-03-22 (×4): qty 1

## 2018-03-22 MED ORDER — HYDROCODONE-ACETAMINOPHEN 5-325 MG PO TABS
1.0000 | ORAL_TABLET | Freq: Four times a day (QID) | ORAL | Status: DC | PRN
  Administered 2018-03-22 – 2018-03-27 (×4): 1 via ORAL
  Filled 2018-03-22 (×4): qty 1

## 2018-03-22 MED ORDER — MAGNESIUM OXIDE 400 (241.3 MG) MG PO TABS: 400.0000 mg | ORAL_TABLET | Freq: Every evening | ORAL | Status: DC

## 2018-03-22 MED ORDER — DEXTROSE 5 % IV SOLN
10.0000 mg | Freq: Once | INTRAVENOUS | Status: AC
  Administered 2018-03-22: 10 mg via INTRAVENOUS
  Filled 2018-03-22: qty 1

## 2018-03-22 MED ORDER — DOCUSATE SODIUM 100 MG PO CAPS
100.0000 mg | ORAL_CAPSULE | Freq: Two times a day (BID) | ORAL | Status: DC
  Administered 2018-03-23 – 2018-03-26 (×4): 100 mg via ORAL
  Filled 2018-03-22 (×5): qty 1

## 2018-03-22 MED ORDER — SODIUM CHLORIDE 0.9 % IV BOLUS
500.0000 mL | Freq: Once | INTRAVENOUS | Status: AC
  Administered 2018-03-22: 500 mL via INTRAVENOUS

## 2018-03-22 MED ORDER — B COMPLEX-C PO TABS
1.0000 | ORAL_TABLET | Freq: Every day | ORAL | Status: DC
  Administered 2018-03-24 – 2018-03-27 (×4): 1 via ORAL
  Filled 2018-03-22 (×6): qty 1

## 2018-03-22 MED ORDER — FUROSEMIDE 20 MG PO TABS: 20.0000 mg | ORAL_TABLET | Freq: Every day | ORAL | Status: DC

## 2018-03-22 MED ORDER — SODIUM CHLORIDE 0.9 % IV BOLUS
1000.0000 mL | Freq: Once | INTRAVENOUS | Status: AC
  Administered 2018-03-22: 1000 mL via INTRAVENOUS

## 2018-03-22 MED ORDER — SODIUM CHLORIDE 0.9 % IV SOLN
Freq: Once | INTRAVENOUS | Status: AC
  Administered 2018-03-22: 15:00:00 via INTRAVENOUS

## 2018-03-22 MED ORDER — MORPHINE SULFATE (PF) 4 MG/ML IV SOLN
1.0000 mg | INTRAVENOUS | Status: DC | PRN
  Administered 2018-03-22 – 2018-03-23 (×3): 1 mg via INTRAVENOUS
  Filled 2018-03-22 (×3): qty 1

## 2018-03-22 MED ORDER — METOPROLOL SUCCINATE ER 100 MG PO TB24
100.0000 mg | ORAL_TABLET | Freq: Every day | ORAL | Status: DC
  Filled 2018-03-22: qty 1

## 2018-03-22 MED ORDER — MORPHINE SULFATE (PF) 4 MG/ML IV SOLN
4.0000 mg | Freq: Once | INTRAVENOUS | Status: AC
  Administered 2018-03-22: 4 mg via INTRAVENOUS
  Filled 2018-03-22: qty 1

## 2018-03-22 MED ORDER — SODIUM CHLORIDE 0.9 % IV SOLN: 1.0000 g | Freq: Once | INTRAVENOUS | Status: DC

## 2018-03-22 MED ORDER — ESCITALOPRAM OXALATE 10 MG PO TABS: 5.0000 mg | ORAL_TABLET | Freq: Every day | ORAL | Status: DC

## 2018-03-22 MED ORDER — NITROGLYCERIN 0.4 MG SL SUBL: 0.4000 mg | SUBLINGUAL_TABLET | SUBLINGUAL | Status: DC | PRN

## 2018-03-22 MED ORDER — SODIUM CHLORIDE 0.9 % IV SOLN
Freq: Once | INTRAVENOUS | Status: AC
  Administered 2018-03-22: 10:00:00 via INTRAVENOUS

## 2018-03-22 MED ORDER — ASPIRIN EC 81 MG PO TBEC: 81.0000 mg | DELAYED_RELEASE_TABLET | Freq: Every day | ORAL | Status: DC

## 2018-03-22 NOTE — Progress Notes (Signed)

## 2018-03-22 NOTE — Progress Notes (Addendum)
PROGRESS NOTE    Kylie Sanchez  PRF:[id number] DOB: [birth year] DOA: 03/21/2018 PCP: Unk Pinto, MD  Brief Narrative:84 y.o. female with a known history of CKD, HTN, HLD, Afib on Coumadin, DM presents to the emergency department for evaluation of right hip pain s/p mechanical fall.  Patient was in a usual state of health until this evening when she got was not using her walker and fell landing on hardwood floors.Complains of right hip pain.  Lives with her younger brother.   Patient denies fevers/chills, weakness, dizziness, chest pain, shortness of breath, N/V/C/D, abdominal pain, dysuria/frequency, changes in mental status.    Otherwise there has been no change in status. Patient has been taking medication as prescribed and there has been no recent change in medication or diet.    EMS/ED Course: Patient received Rocephin, mophine, NS. Medical admission has been requested for further management of right intertroch hip fracture.    Assessment & Plan:   Active Problems:   Closed right hip fracture, initial encounter ([place])  #. .R intertroch hip fracture - Admit inpatient - Ortho consulted by EDP - Will hold Coumadin for INR to normalize prior to surgery.she received vit k 10 mg one time ordered by ortho. - Given age and multiple cardiovascular comorbidities, will need cardio clearance.i have called in the consult today.  #. UTI - Continue Rocephin - follow up cultures  #. H/o Diabetes - Accuchecks achs with RISS coverage - Heart healthy, carb controlled diet - Hold Amaryl, Metformin   #. History of HTN/afib - metoprolol had to be held due to soft bp she received 2 lit ivf since admit.       DVT prophylaxis:WAS ON COUMADIN ON HOLD FOR SURGERY Code Status:FULL Family Communication:DW HUSBAND Disposition Plan:TBD  Consultants: ORHTO,CARDIOLOGY  Procedures: NONE Antimicrobials: ROCEPHIN  Subjective:RESTING HUSBAND BY THE BEDSIDE   Objective: Vitals:   03/22/18 0930 03/22/18 1030 03/22/18 1130 03/22/18 1200  BP: (!) 86/40 93/74 (!) 110/44 (!) 97/43  Pulse: 66 79 67 67  Resp: 16  (!) 21 16  Temp:      TempSrc:      SpO2: 98% 100% 100% 99%  Weight:      Height:        Intake/Output Summary (Last 24 hours) at 03/22/2018 1219 Last data filed at 03/22/2018 0529 Gross per 24 hour  Intake 600 ml  Output -  Net 600 ml   Filed Weights   03/21/18 2350  Weight: 63.5 kg (140 lb)    Examination:  General exam: Appears calm and comfortable  Respiratory system: Clear to auscultation. Respiratory effort normal. Cardiovascular system: S1 & S2 heard, RRR. No JVD, murmurs, rubs, gallops or clicks. No pedal edema. Gastrointestinal system: Abdomen is nondistended, soft and nontender. No organomegaly or masses felt. Normal bowel sounds heard. Central nervous system: Alert and oriented. No focal neurological deficits. Extremities: tender right hip Skin: No rashes, lesions or ulcers Psychiatry: Judgement and insight appear normal. Mood & affect appropriate.     Data Reviewed: I have personally reviewed following labs and imaging studies  CBC: Recent Labs  Lab 03/22/18 0007 03/22/18 0430 03/22/18 0955  WBC 17.7* 16.8*  --   NEUTROABS 14.6*  --   --   HGB 11.0* 9.3* 8.3*  HCT 33.8* 29.3* 26.2*  MCV 92.6 93.6  --   PLT 278 255  --    Basic Metabolic Panel: Recent Labs  Lab 03/22/18 0007 03/22/18 0430  NA 139 140  K 4.8 5.2*  CL 104 109  CO2 24 21*  GLUCOSE 234* 231*  BUN 45* 44*  CREATININE 1.37* 1.49*  CALCIUM 9.0 8.5*   GFR: Estimated Creatinine Clearance: 22 mL/min (A) (by C-G formula based on SCr of 1.49 mg/dL (H)). Liver Function Tests: Recent Labs  Lab 03/22/18 0007  AST 25  ALT 26  ALKPHOS 76  BILITOT 0.4  PROT 6.3*  ALBUMIN 3.2*   No results for input(s): LIPASE, AMYLASE in the last 168 hours. No results for input(s): AMMONIA in the last 168 hours. Coagulation Profile: Recent Labs  Lab 03/18/18 1141  03/22/18 0007 03/22/18 0430  INR 2.0 2.92 3.17   Cardiac Enzymes: No results for input(s): CKTOTAL, CKMB, CKMBINDEX, TROPONINI in the last 168 hours. BNP (last 3 results) No results for input(s): PROBNP in the last 8760 hours. HbA1C: No results for input(s): HGBA1C in the last 72 hours. CBG: Recent Labs  Lab 03/22/18 0747  GLUCAP 232*   Lipid Profile: No results for input(s): CHOL, HDL, LDLCALC, TRIG, CHOLHDL, LDLDIRECT in the last 72 hours. Thyroid Function Tests: No results for input(s): TSH, T4TOTAL, FREET4, T3FREE, THYROIDAB in the last 72 hours. Anemia Panel: No results for input(s): VITAMINB12, FOLATE, FERRITIN, TIBC, IRON, RETICCTPCT in the last 72 hours. Sepsis Labs: No results for input(s): PROCALCITON, LATICACIDVEN in the last 168 hours.  No results found for this or any previous visit (from the past 240 hour(s)).       Radiology Studies: Dg Chest 1 View  Result Date: 03/22/2018 CLINICAL DATA:  Closed fracture of the right hip.  Preop. EXAM: CHEST  1 VIEW COMPARISON:  05/18/2017 FINDINGS: Heart is top-normal in size. There is aortic atherosclerosis. No definite aneurysm. Left-sided pacemaker apparatus right atrial and right ventricular leads are redemonstrated. There is scarring overlying the left costophrenic angle and to a lesser degree the right costophrenic angle. Clearing of right-sided pleural effusions since previous exam. No overt pulmonary edema or pulmonary consolidation. Intact bony thorax. 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 IMPRESSION: No pneumonia.  Bibasilar scarring.  Aortic atherosclerosis. Electronically Signed   By: Ashley Royalty M.D.   On: 03/22/2018 01:42   Ct Head Wo Contrast  Result Date: 03/22/2018 CLINICAL DATA:  Status post fall last night. EXAM: CT HEAD WITHOUT CONTRAST TECHNIQUE: Contiguous axial images were obtained from the base of the skull through the vertex without intravenous contrast. COMPARISON:  May 13, 2017 FINDINGS:  Brain: No evidence of acute infarction, hemorrhage, hydrocephalus, extra-axial collection or mass lesion/mass effect. There is chronic diffuse atrophy. Chronic bilateral periventricular white matter small vessel ischemic changes noted. Small old right basal ganglia lacunar infarction is identified unchanged. Vascular: No hyperdense vessel or unexpected calcification. 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 Sinuses/Orbits: No acute abnormality. 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 Other: Right parietal scalp swelling and hematoma is noted. IMPRESSION: No focal acute intracranial abnormality identified. Right parietal scalp swelling hematoma is identified. Electronically Signed   By: Abelardo Diesel M.D.   On: 03/22/2018 07:53   Dg Hip Unilat With Pelvis 2-3 Views Left  Result Date: 03/22/2018 CLINICAL DATA:  84 year old female with fall. EXAM: DG HIP (WITH OR WITHOUT PELVIS) 2-3V LEFT; DG HIP (WITH OR WITHOUT PELVIS) 2-3V RIGHT COMPARISON:  None. FINDINGS: There is a comminuted and mildly displaced intertrochanteric fracture of the right femur with mild proximal migration of the femoral diaphysis. There is no fracture or dislocation of the left hip. Sclerotic area  involving the right inferior pubic ramus, age indeterminate, possibly chronic. Clinical correlation is recommended. There is advanced osteopenia. There is degenerative changes of the lower lumbar spine. The soft tissues are grossly unremarkable. IMPRESSION: Mildly displaced intertrochanteric fracture of the right femur. No dislocation. Electronically Signed   By: Anner Crete M.D.   On: 03/22/2018 01:39   Dg Hip Unilat  With Pelvis 2-3 Views Right  Result Date: 03/22/2018 CLINICAL DATA:  84 year old female with fall. EXAM: DG HIP (WITH OR WITHOUT PELVIS) 2-3V LEFT; DG HIP (WITH OR WITHOUT PELVIS) 2-3V RIGHT COMPARISON:  None. FINDINGS: There is a comminuted and  mildly displaced intertrochanteric fracture of the right femur with mild proximal migration of the femoral diaphysis. There is no fracture or dislocation of the left hip. Sclerotic area involving the right inferior pubic ramus, age indeterminate, possibly chronic. Clinical correlation is recommended. There is advanced osteopenia. There is degenerative changes of the lower lumbar spine. The soft tissues are grossly unremarkable. IMPRESSION: Mildly displaced intertrochanteric fracture of the right femur. No dislocation. Electronically Signed   By: Anner Crete M.D.   On: 03/22/2018 01:39        Scheduled Meds: . aspirin EC  81 mg Oral Daily  . atorvastatin  40 mg Oral q1800  . B-complex with vitamin C  1 tablet Oral Daily  . docusate sodium  100 mg Oral BID  . escitalopram  5 mg Oral Daily  . insulin aspart  0-15 Units Subcutaneous TID WC  . magnesium oxide  400 mg Oral QPM   Continuous Infusions: . cefTRIAXone (ROCEPHIN)  IV    . cefTRIAXone (ROCEPHIN)  IV Stopped (03/22/18 0529)     LOS: 0 days     Georgette Shell, MD [facility] If 7PM-7AM, please contact night-coverage [email or web address] Password [facility] 03/22/2018, 12:19 PM

## 2018-03-22 NOTE — Consult Note (Addendum)
Name: Kylie Sanchez, Kylie Sanchez Female, 84 y.o., [birth year]  Chart reviewed, patient seen and examined.  Pt is poor historian, info from chart.  Consulted by hospitalist for transient hypotension    CC- Fall-> Right femur fracture    HPI 84 y/o female with DM, SSS, Atrial fib, s/p dual chamber PPM, on coumadin,  Had a mechanical fall and found to have right femur fracture. Seen by Ortho and Cardiology. Coagulopathy being corrected by FFP. Finished 1 st unit PRBC. FFP being transfused now. Hb dropped by 3 g%. Pt in severe pain. BP seems improved with volume expansion. recvd 2.5 lit NS so far.  NO Chest pain, dyspnea, overt bleeding reported.  CT head- no intracranial injury.      has a past medical history of Cancer ([place]) (2015), Cataract, CKD (chronic kidney disease) stage 3, GFR 30-59 ml/min (HCC), Dizziness, Hyperlipidemia, Hypertension, Hypertensive cardiovascular disease, Impaired vision, Memory loss, Paroxysmal atrial fibrillation (HCC), Presence of permanent cardiac pacemaker, Retinopathy, Sick sinus syndrome ([place]), and Type II or unspecified type diabetes mellitus without mention of complication, not stated as uncontrolled.   has a past surgical history that includes Pacemaker insertion (04/20/12); Dental surgery; permanent pacemaker insertion (N/A, 04/20/2012); Eye surgery (Left); Endarterectomy (Right, 05/13/2017); Endarterectomy (Right, 05/13/2017); and Patch angioplasty (Right, 05/13/2017).  Family History: Cardiac disease  Social History: non smoker   Home Medications: revwd.  Inpatient Medications:  . atorvastatin  40 mg Oral q1800  . B-complex with vitamin C  1 tablet Oral Daily  . docusate sodium  100 mg Oral BID  . insulin aspart  0-15 Units Subcutaneous TID WC       ROS Unable> pt is critically ill   Physical Exam: Blood pressure (!) 132/47, pulse 84, temperature 98.2 F (36.8 C), temperature source Oral, resp. rate 20, height 5\' 2"  (1.575 m), weight 140 lb (63.5 kg),  SpO2 98 %.  General appearance  Elderly, in pain   HEENT  1+ pallor/icterus  left eye enucleation No oral lesion  no JVD  Cardiovascular S1 and S2 regular no Murmur/gallop PPM- left infraclavicular  Respiratory Chest expansion equal Breath sounds equal and clear No rhonchi or rales     Abdomen   Soft,Nontender, nondistended No organomegaly,No mass Bowel sounds+   Extremities Mild right thigh edema, skin ecchymosis laterally Capillary refill adequate  able to move toes and feel  Neurological Alert, awake, oriented Grossly intact Right pupil regular 3 mm    skin- no rash   Labs CBC Latest Ref Rng & Units 03/22/2018 03/22/2018 03/22/2018  WBC 4.0 - 10.5 K/uL 12.1(H) - 16.8(H)  Hemoglobin 12.0 - 15.0 g/dL 7.9(L) 8.3(L) 9.3(L)  Hematocrit 36.0 - 46.0 % 24.9(L) 26.2(L) 29.3(L)  Platelets 150 - 400 K/uL 239 - 255     BMP Latest Ref Rng & Units 03/22/2018 03/22/2018 11/17/2017  Glucose 65 - 99 mg/dL 231(H) 234(H) 149(H)  BUN 6 - 20 mg/dL 44(H) 45(H) 45(H)  Creatinine 0.44 - 1.00 mg/dL 1.49(H) 1.37(H) 1.44(H)  BUN/Creat Ratio 6 - 22 (calc) - - 31(H)  Sodium 135 - 145 mmol/L 140 139 138  Potassium 3.5 - 5.1 mmol/L 5.2(H) 4.8 5.5(H)  Chloride 101 - 111 mmol/L 109 104 101  CO2 22 - 32 mmol/L 21(L) 24 31  Calcium 8.9 - 10.3 mg/dL 8.5(L) 9.0 9.6  Results for Kylie Sanchez, Kylie Sanchez (MRN [id number]) as of 03/22/2018 16:47  Ref. Range 03/22/2018 02:21  Appearance Latest Ref Range: CLEAR  HAZY (A)  Bilirubin Urine Latest Ref Range: NEGATIVE  NEGATIVE  Color, Urine Latest Ref Range: YELLOW  YELLOW  Glucose Latest Ref Range: NEGATIVE mg/dL 50 (A)  Hgb urine dipstick Latest Ref Range: NEGATIVE  NEGATIVE  Ketones, ur Latest Ref Range: NEGATIVE mg/dL NEGATIVE  Leukocytes, UA Latest Ref Range: NEGATIVE  TRACE (A)  Nitrite Latest Ref Range: NEGATIVE  POSITIVE (A)  pH Latest Ref Range: 5.0 - 8.0  7.0  Protein Latest Ref Range: NEGATIVE mg/dL NEGATIVE  Specific Gravity, Urine Latest Ref  Range: 1.005 - 1.030  1.014  Bacteria, UA Latest Ref Range: NONE SEEN  MANY (A)  Mucus Unknown PRESENT  RBC / HPF Latest Ref Range: 0 - 5 RBC/hpf 0-5  WBC, UA Latest Ref Range: 0 - 5 WBC/hpf 11-20   Xray  Chest 03-22-18 No infiltrate or heart failure PPM leads in RA, RV  CT scan head 03-22-18  No focal acute intracranial abnormality identified. Right parietal scalp swelling hematoma is identified.  Ekg 03-22-18 Sinus rhythm Multiple ventricular premature complexes Nonspecific IVCD with LAD LVH with secondary repolarization abnormality Anterolateral infarct, age indeterminate QTc- 454msec   TTE   Aug 2018  - Left ventricle: The cavity size was normal. Wall thickness was   normal. Systolic function was normal. The estimated ejection   fraction was in the range of 60% to 65%. Wall motion was normal;   there were no regional wall motion abnormalities. Doppler   parameters are consistent with abnormal left ventricular   relaxation (grade 1 diastolic dysfunction). The E/e &' ratio is   between 8-15, suggesting indeterminate LV filling pressure. - Mitral valve: Mildly thickened leaflets . There was mild   regurgitation. - Left atrium: Moderately dilated. - Tricuspid valve: There was mild regurgitation. - Pulmonary arteries: PA peak pressure: 34 mm Hg (S). - Inferior vena cava: The vessel was normal in size. The   respirophasic diameter changes were in the normal range (= 50%),   consistent with normal central venous pressure.      Assessment and Plan:  84      Y/o   Female        With    H/o DM,PPM for SSS, afib on coumadin,  Presenting with mechanical fall, likely internal blood loss/oozing from fracture site from coumadin coagulopathy>>acute blood loss anemia + dilutional effect,   Hypotension / shock due to hypovolemia and acute blood loss, improved with NS and PRBC.  Evaluated by Cardiology & Ortho.    Cardiology  Agree with PRBC and volume expansion. Monitor  Hemodynamics.   Pulmonary  Encourage incentive spirometry Prn 02   GI /nutrition  Npo  preop  Renal, fluids, electrolytes Monitor lytes and renal function   Hematology Serial H/H, INR monitoring Keep hb > 8g% Resume anticoag post op if no further bleeding   Endocrinology Insulin sliding scale ; monitor sugars Hold oral agent  Infectious diseases On Rocephin for possible UTI F/u urine c/s     Code status: DNR ( per chart)  Hemodynamically stable at present; being admitted to stepdown bed; if clinical situation changes please feel free to reconsult ICU.  Thank you for letting me participate in the care of your patient.  ^^^^^^^^^^ I  Have personally spent  50     Minutes  In the care of this Patient providing Critical care Services; Time includes review of chart, labs, imaging, coordinating care with other physicians and healthcare team members. Also includes time for frequent reevaluation and additional treatment implementation due to change in clinical condiiton of patient. Excludes time  spent for Procedure and Teaching.   ^^^^^^^^^^   Evans Lance, MD Pulmonary and [facility] Pager: [phone]

## 2018-03-22 NOTE — Progress Notes (Signed)
Orthopedic Tech Progress Note Patient Details:  Kylie Sanchez 1925/08/22 [id number]  Patient ID: Kylie Sanchez, female   DOB: 06-28-1925, 84 y.o.   MRN: [id number] Pt cant have ohf due to age restrictions.  Kylie Sanchez 03/22/2018, 10:32 PM

## 2018-03-22 NOTE — ED Notes (Signed)
Pt requesting pain medications. Notified pt that until BP improves unable to administer pain medications. Family at bedside.

## 2018-03-22 NOTE — ED Notes (Signed)
Blood rate increased to 999 ml/hour per MD Zigmund Daniel

## 2018-03-22 NOTE — ED Notes (Signed)
Plasma increased to 999 ml/hr per md order

## 2018-03-22 NOTE — ED Notes (Signed)
Pt refused 1000 meds. States too painful to sit up to take medications.

## 2018-03-22 NOTE — ED Notes (Signed)
Dr Louanne Skye at the bedside witnessed verbal consent for administration of blood. Pt states unable to sign.

## 2018-03-22 NOTE — ED Notes (Signed)
Provider paged and notified , patient has bruising on head which is more prominent than before. Ct head ordered.

## 2018-03-22 NOTE — ED Notes (Signed)
Rate increased to 999 ml.hr per md order

## 2018-03-22 NOTE — H&P (Signed)
History and Physical   [facility] Admission History and Physical [employer], D.O.    Patient Name: Kylie Sanchez MR#: [id number] Date of Birth: October 03, 1925 Date of Admission: 03/21/2018  Referring MD/NP/PA: Dr. Tomi Bamberger Primary Care Physician: Unk Pinto, MD  Chief Complaint: No chief complaint on file.   HPI: Kylie Sanchez is a 84 y.o. female with a known history of CKD, HTN, HLD, Afib on Coumadin, DM presents to the emergency department for evaluation of right hip pain s/p mechanical fall.  Patient was in a usual state of health until this evening when she got was not using her walker and fell landing on hardwood floors.Complains of right hip pain.  Lives with her younger brother.   Patient denies fevers/chills, weakness, dizziness, chest pain, shortness of breath, N/V/C/D, abdominal pain, dysuria/frequency, changes in mental status.    Otherwise there has been no change in status. Patient has been taking medication as prescribed and there has been no recent change in medication or diet.    EMS/ED Course: Patient received Rocephin, mophine, NS. Medical admission has been requested for further management of right intertroch hip fracture.  Review of Systems:  CONSTITUTIONAL: No fever/chills, fatigue, weakness, weight gain/loss, headache. EYES: No blurry or double vision. ENT: No tinnitus, postnasal drip, redness or soreness of the oropharynx. RESPIRATORY: No cough, dyspnea, wheeze.  No hemoptysis.  CARDIOVASCULAR: No chest pain, palpitations, syncope, orthopnea. No lower extremity edema.  GASTROINTESTINAL: No nausea, vomiting, abdominal pain, diarrhea, constipation.  No hematemesis, melena or hematochezia. GENITOURINARY: No dysuria, frequency, hematuria. ENDOCRINE: No polyuria or nocturia. No heat or cold intolerance. HEMATOLOGY: No anemia, bruising, bleeding. INTEGUMENTARY: No rashes, ulcers, lesions. MUSCULOSKELETAL: Right hip pain.  No  arthritis, gout, dyspnea. NEUROLOGIC: No numbness, tingling, ataxia, seizure-type activity, weakness. PSYCHIATRIC: No anxiety, depression, insomnia.   Past Medical History:  Diagnosis Date  . Cancer ([place]) 2015   left eye  . Cataract   . CKD (chronic kidney disease) stage 3, GFR 30-59 ml/min (HCC)    due to DM  . Dizziness   . Hyperlipidemia   . Hypertension   . Hypertensive cardiovascular disease   . Impaired vision   . Memory loss   . Paroxysmal atrial fibrillation (HCC)   . Presence of permanent cardiac pacemaker   . Retinopathy   . Sick sinus syndrome ([place])   . Type II or unspecified type diabetes mellitus without mention of complication, not stated as uncontrolled     Past Surgical History:  Procedure Laterality Date  . DENTAL SURGERY    . ENDARTERECTOMY Right 05/13/2017   Procedure: REDO ENDARTERECTOMY CAROTID Resection Redundant Internal Corotid Artery.;  Surgeon: Angelia Mould, MD;  Location: [facility];  Service: Vascular;  Laterality: Right;  . ENDARTERECTOMY Right 05/13/2017   Procedure: ENDARTERECTOMY CAROTID-RIGHT;  Surgeon: Serafina Mitchell, MD;  Location: [facility];  Service: Vascular;  Laterality: Right;  . EYE SURGERY Left    tumor and eye removed  . PACEMAKER INSERTION  04/20/12   MDT Adapta L implanted by Dr Rayann Heman  . PATCH ANGIOPLASTY Right 05/13/2017   Procedure: PATCH ANGIOPLASTY USING Rueben Bash BIOLOGIC PATCH;  Surgeon: Serafina Mitchell, MD;  Location: [facility] OR;  Service: Vascular;  Laterality: Right;  . PERMANENT PACEMAKER INSERTION N/A 04/20/2012   Procedure: PERMANENT PACEMAKER INSERTION;  Surgeon: Thompson Grayer, MD;  Location: [facility] CATH LAB;  Service: Cardiovascular;  Laterality: N/A;     reports that she has never smoked. She has never used smokeless  tobacco. She reports that she does not drink alcohol or use drugs.  Allergies  Allergen Reactions  . No Known Allergies     Family History  Problem Relation Age of Onset  . Heart attack Father   . Heart  disease Father   . Hypertension Mother   . Heart disease Mother   . Cancer Other     Prior to Admission medications   Medication Sig Start Date End Date Taking? Authorizing Provider  metFORMIN (GLUCOPHAGE-XR) 500 MG 24 hr tablet Take 1 tablet (500 mg total) by mouth every morning. Take with meal for diabetes 11/05/17  Yes Unk Pinto, MD  nitroGLYCERIN (NITROSTAT) 0.4 MG SL tablet Place 1 tablet (0.4 mg total) under the tongue every 5 (five) minutes as needed for chest pain. 10/30/17 01/28/19 Yes Allred, Jeneen Rinks, MD  aspirin EC 81 MG EC tablet Take 1 tablet (81 mg total) by mouth daily. 05/24/17   Alvia Grove, PA-C  atorvastatin (LIPITOR) 40 MG tablet Take 1 tablet (40 mg total) by mouth daily at 6 PM. 10/28/17   Unk Pinto, MD  b complex vitamins tablet Take 1 tablet by mouth daily.    [provider]  blood glucose meter kit and supplies Test sugars once daily. Dispense based insurance preference. E11.9 11/20/17   Vicie Mutters, PA-C  Cholecalciferol (VITAMIN D PO) Take 1 tablet by mouth every evening.     [provider]  Coenzyme Q10 (CO Q 10) 100 MG CAPS Take 100 mg by mouth every evening.     [provider]  escitalopram (LEXAPRO) 10 MG tablet TAKE ONE (1) TABLET BY MOUTH EVERY DAY Patient taking differently: 5 mg. TAKE ONE (1) TABLET BY MOUTH EVERY DAY 11/17/17   Vicie Mutters, PA-C  furosemide (LASIX) 20 MG tablet Take 1 tablet daily for BP & Fluid 02/23/18   Unk Pinto, MD  glimepiride (AMARYL) 2 MG tablet TAKE ONE (1) TABLET BY MOUTH EVERY DAY 11/05/17   Unk Pinto, MD  glucose blood test strip Check sugars once to twice a day for diabetes E11.21 11/17/17   Vicie Mutters, PA-C  Magnesium 250 MG TABS Take 250 mg by mouth every evening.     [provider]  metoprolol succinate (TOPROL-XL) 100 MG 24 hr tablet TAKE ONE (1) TABLET BY MOUTH TWO (2) TIMES DAILY WITH OR IMMEDIATELY FOLLOWING A MEAL 10/30/17   Allred, Jeneen Rinks, MD   warfarin (COUMADIN) 4 MG tablet Take as directed by [facility] 10/30/17   Thompson Grayer, MD    Physical Exam: Vitals:   03/22/18 0200 03/22/18 0230 03/22/18 0245 03/22/18 0300  BP: (!) 100/53 (!) 106/58 (!) 85/50 (!) 75/47  Pulse: 61  72 64  Resp: 13 (!) _0 Temp:      TempSrc:      SpO2: 100%  (!) 87% 100%  Weight:      Height:        GENERAL: 84 y.o.-year-old female patient, pale, well-developed, well-nourished lying in the bed in no acute distress.  Pleasant and cooperative.   HEENT: Head atraumatic, normocephalic. Pupils equal. Mucus membranes dry NECK: Supple, full range of motion. No JVD. CHEST: Normal breath sounds bilaterally. No wheezing, rales, rhonchi or crackles. No use of accessory muscles of respiration.  No reproducible chest wall tenderness.  CARDIOVASCULAR: S1, S2 normal. No murmurs, rubs, or gallops. Cap refill <2 seconds. Pulses intact distally.  ABDOMEN: Soft, nondistended, nontender. No rebound, guarding, rigidity. Normoactive bowel sounds present in all  four quadrants.  EXTREMITIES: Right leg shortened.  Tenderness over right lateral hip.  No pedal edema, cyanosis, or clubbing. No calf tenderness or Homan's sign.  NEUROLOGIC: The patient is alert and oriented x 3. Cranial nerves II through XII are grossly intact with no focal sensorimotor deficit. PSYCHIATRIC:  Normal affect, mood, thought content. SKIN: Warm, dry, and intact without obvious rash, lesion, or ulcer.    Labs on Admission:  CBC: Recent Labs  Lab 03/22/18 0007  WBC 17.7*  NEUTROABS 14.6*  HGB 11.0*  HCT 33.8*  MCV 92.6  PLT 295   Basic Metabolic Panel: Recent Labs  Lab 03/22/18 0007  NA 139  K 4.8  CL 104  CO2 24  GLUCOSE 234*  BUN 45*  CREATININE 1.37*  CALCIUM 9.0   GFR: Estimated Creatinine Clearance: 23.9 mL/min (A) (by C-G formula based on SCr of 1.37 mg/dL (H)). Liver Function Tests: Recent Labs  Lab 03/22/18 0007  AST 25  ALT 26  ALKPHOS 76  BILITOT  0.4  PROT 6.3*  ALBUMIN 3.2*   No results for input(s): LIPASE, AMYLASE in the last 168 hours. No results for input(s): AMMONIA in the last 168 hours. Coagulation Profile: Recent Labs  Lab 03/18/18 1141 03/22/18 0007  INR 2.0 2.92   Cardiac Enzymes: No results for input(s): CKTOTAL, CKMB, CKMBINDEX, TROPONINI in the last 168 hours. BNP (last 3 results) No results for input(s): PROBNP in the last 8760 hours. HbA1C: No results for input(s): HGBA1C in the last 72 hours. CBG: No results for input(s): GLUCAP in the last 168 hours. Lipid Profile: No results for input(s): CHOL, HDL, LDLCALC, TRIG, CHOLHDL, LDLDIRECT in the last 72 hours. Thyroid Function Tests: No results for input(s): TSH, T4TOTAL, FREET4, T3FREE, THYROIDAB in the last 72 hours. Anemia Panel: No results for input(s): VITAMINB12, FOLATE, FERRITIN, TIBC, IRON, RETICCTPCT in the last 72 hours. Urine analysis:    Component Value Date/Time   COLORURINE YELLOW 03/22/2018 0221   APPEARANCEUR HAZY (A) 03/22/2018 0221   LABSPEC 1.014 03/22/2018 0221   PHURINE 7.0 03/22/2018 0221   GLUCOSEU 50 (A) 03/22/2018 0221   HGBUR NEGATIVE 03/22/2018 0221   BILIRUBINUR NEGATIVE 03/22/2018 0221   KETONESUR NEGATIVE 03/22/2018 0221   PROTEINUR NEGATIVE 03/22/2018 0221   UROBILINOGEN 0.2 02/10/2015 1034   NITRITE POSITIVE (A) 03/22/2018 0221   LEUKOCYTESUR TRACE (A) 03/22/2018 0221   Sepsis Labs: _0 (procalcitonin:4,lacticidven:4) )No results found for this or any previous visit (from the past 240 hour(s)).   Radiological Exams on Admission: Dg Chest 1 View  Result Date: 03/22/2018 CLINICAL DATA:  Closed fracture of the right hip.  Preop. EXAM: CHEST  1 VIEW COMPARISON:  05/18/2017 FINDINGS: Heart is top-normal in size. There is aortic atherosclerosis. No definite aneurysm. Left-sided pacemaker apparatus right atrial and right ventricular leads are redemonstrated. There is scarring overlying the left costophrenic angle  and to a lesser degree the right costophrenic angle. Clearing of right-sided pleural effusions since previous exam. No overt pulmonary edema or pulmonary consolidation. Intact bony thorax. 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 IMPRESSION: No pneumonia.  Bibasilar scarring.  Aortic atherosclerosis. Electronically Signed   By: Ashley Royalty M.D.   On: 03/22/2018 01:42   Dg Hip Unilat With Pelvis 2-3 Views Left  Result Date: 03/22/2018 CLINICAL DATA:  84 year old female with fall. EXAM: DG HIP (WITH OR WITHOUT PELVIS) 2-3V LEFT; DG HIP (WITH OR WITHOUT PELVIS) 2-3V RIGHT COMPARISON:  None. FINDINGS: There is a comminuted and mildly displaced intertrochanteric fracture of  the right femur with mild proximal migration of the femoral diaphysis. There is no fracture or dislocation of the left hip. Sclerotic area involving the right inferior pubic ramus, age indeterminate, possibly chronic. Clinical correlation is recommended. There is advanced osteopenia. There is degenerative changes of the lower lumbar spine. The soft tissues are grossly unremarkable. IMPRESSION: Mildly displaced intertrochanteric fracture of the right femur. No dislocation. Electronically Signed   By: Anner Crete M.D.   On: 03/22/2018 01:39   Dg Hip Unilat  With Pelvis 2-3 Views Right  Result Date: 03/22/2018 CLINICAL DATA:  84 year old female with fall. EXAM: DG HIP (WITH OR WITHOUT PELVIS) 2-3V LEFT; DG HIP (WITH OR WITHOUT PELVIS) 2-3V RIGHT COMPARISON:  None. FINDINGS: There is a comminuted and mildly displaced intertrochanteric fracture of the right femur with mild proximal migration of the femoral diaphysis. There is no fracture or dislocation of the left hip. Sclerotic area involving the right inferior pubic ramus, age indeterminate, possibly chronic. Clinical correlation is recommended. There is advanced osteopenia. There is degenerative changes of the lower lumbar spine. The soft tissues are grossly unremarkable.  IMPRESSION: Mildly displaced intertrochanteric fracture of the right femur. No dislocation. Electronically Signed   By: Anner Crete M.D.   On: 03/22/2018 01:39    EKG: Normal sinus rhythm at 61 bpm with normal axis and nonspecific ST-T wave changes.   Assessment/Plan  This is a 84 y.o. female with a history of CKD, HTN, HLD, Afib on Coumadin, DM now being admitted with:  #. .R intertroch hip fracture - Admit inpatient - Ortho consulted by EDP - Will hold Coumadin for INR to normalize prior to surgery - Given age and multiple cardiovascular comorbidities, will need cardio clearance  #. UTI - Continue Rocephin - follow up cultures  #. H/o Diabetes - Accuchecks achs with RISS coverage - Heart healthy, carb controlled diet - Hold Amaryl, Metformin   #. History of HTN/afib - Continue metoprolol   Admission status: Inpatient IV Fluids: HL Diet/Nutrition: Carb controlled Consults called: Ortho, Cardio  DVT Px: Lovenox not yet started 2/2 therapeutic INR, SCDs and early ambulation. Code Status: Full Code, confirmed with patient.  Disposition Plan: To be determined  All the records are reviewed and case discussed with ED provider. Management plans discussed with the patient and/or family who express understanding and agree with plan of care.  Lile Mccurley D.O. on 03/22/2018 at 3:28 AM CC: Primary care physician; Unk Pinto, MD   03/22/2018, 3:28 AM

## 2018-03-22 NOTE — Consult Note (Signed)
Reason for Consult:  Right hip fracture Referring Physician: Tomi Bamberger, MD/EDP  Kylie Sanchez is an 84 y.o. female.  HPI: The patient is a 84 year old female who lives with her brother and his family.  She sustained an accidental mechanical fall last evening landing on her right hip.  She had the inability to ambulate and severe pain.  She was brought to the emergency room and x-rays were obtained showing a complex right hip intertrochanteric fracture.  Orthopedic surgery is consulted for evaluation treatment of this injury.  The patient does report severe right hip pain.  She denies any other injuries or problems as relates to her chief complaint of right hip pain and her known right hip fracture.  Past Medical History:  Diagnosis Date  . Cancer ([place]) 2015   left eye  . Cataract   . CKD (chronic kidney disease) stage 3, GFR 30-59 ml/min (HCC)    due to DM  . Dizziness   . Hyperlipidemia   . Hypertension   . Hypertensive cardiovascular disease   . Impaired vision   . Memory loss   . Paroxysmal atrial fibrillation (HCC)   . Presence of permanent cardiac pacemaker   . Retinopathy   . Sick sinus syndrome ([place])   . Type II or unspecified type diabetes mellitus without mention of complication, not stated as uncontrolled     Past Surgical History:  Procedure Laterality Date  . DENTAL SURGERY    . ENDARTERECTOMY Right 05/13/2017   Procedure: REDO ENDARTERECTOMY CAROTID Resection Redundant Internal Corotid Artery.;  Surgeon: Angelia Mould, MD;  Location: [facility];  Service: Vascular;  Laterality: Right;  . ENDARTERECTOMY Right 05/13/2017   Procedure: ENDARTERECTOMY CAROTID-RIGHT;  Surgeon: Serafina Mitchell, MD;  Location: [facility] OR;  Service: Vascular;  Laterality: Right;  . EYE SURGERY Left    tumor and eye removed  . PACEMAKER INSERTION  04/20/12   MDT Adapta L implanted by Dr Rayann Heman  . PATCH ANGIOPLASTY Right 05/13/2017   Procedure: PATCH ANGIOPLASTY USING Rueben Bash BIOLOGIC PATCH;  Surgeon:  Serafina Mitchell, MD;  Location: [facility] OR;  Service: Vascular;  Laterality: Right;  . PERMANENT PACEMAKER INSERTION N/A 04/20/2012   Procedure: PERMANENT PACEMAKER INSERTION;  Surgeon: Thompson Grayer, MD;  Location: [facility] CATH LAB;  Service: Cardiovascular;  Laterality: N/A;    Family History  Problem Relation Age of [place]  . Heart attack Father   . Heart disease Father   . Hypertension Mother   . Heart disease Mother   . Cancer Other     Social History:  reports that she has never smoked. She has never used smokeless tobacco. She reports that she does not drink alcohol or use drugs.  Allergies:  Allergies  Allergen Reactions  . No Known Allergies     Medications: I have reviewed the patient's current medications.  Results for orders placed or performed during the hospital encounter of 03/21/18 (from the past 48 hour(s))  Comprehensive metabolic panel     Status: Abnormal   Collection Time: 03/22/18 12:07 AM  Result Value Ref Range   Sodium 139 135 - 145 mmol/L   Potassium 4.8 3.5 - 5.1 mmol/L   Chloride 104 101 - 111 mmol/L   CO2 24 22 - 32 mmol/L   Glucose, Bld 234 (H) 65 - 99 mg/dL   BUN 45 (H) 6 - 20 mg/dL   Creatinine, Ser 1.37 (H) 0.44 - 1.00 mg/dL   Calcium 9.0 8.9 - 10.3 mg/dL   Total Protein 6.3 (  L) 6.5 - 8.1 g/dL   Albumin 3.2 (L) 3.5 - 5.0 g/dL   AST 25 15 - 41 U/L   ALT 26 14 - 54 U/L   Alkaline Phosphatase 76 38 - 126 U/L   Total Bilirubin 0.4 0.3 - 1.2 mg/dL   GFR calc non Af Amer 33 (L) >60 mL/min   GFR calc Af Amer 38 (L) >60 mL/min    Comment: (NOTE) The eGFR has been calculated using the CKD EPI equation. This calculation has not been validated in all clinical situations. eGFR's persistently <60 mL/min signify possible Chronic Kidney Disease.    Anion gap 11 5 - 15    Comment: Performed at [facility] [address], [place], [place] [address]  CBC with Differential     Status: Abnormal   Collection Time: 03/22/18 12:07 AM  Result Value Ref Range    WBC 17.7 (H) 4.0 - 10.5 K/uL   RBC 3.65 (L) 3.87 - 5.11 MIL/uL   Hemoglobin 11.0 (L) 12.0 - 15.0 g/dL   HCT 33.8 (L) 36.0 - 46.0 %   MCV 92.6 78.0 - 100.0 fL   MCH 30.1 26.0 - 34.0 pg   MCHC 32.5 30.0 - 36.0 g/dL   RDW 13.4 11.5 - 15.5 %   Platelets 278 150 - 400 K/uL   Neutrophils Relative % 83 %   Neutro Abs 14.6 (H) 1.7 - 7.7 K/uL   Lymphocytes Relative 12 %   Lymphs Abs 2.1 0.7 - 4.0 K/uL   Monocytes Relative 4 %   Monocytes Absolute 0.7 0.1 - 1.0 K/uL   Eosinophils Relative 1 %   Eosinophils Absolute 0.3 0.0 - 0.7 K/uL   Basophils Relative 0 %   Basophils Absolute 0.0 0.0 - 0.1 K/uL    Comment: Performed at [facility] [address], [place], [place] [address]  Protime-INR     Status: Abnormal   Collection Time: 03/22/18 12:07 AM  Result Value Ref Range   Prothrombin Time 30.3 (H) 11.4 - 15.2 seconds   INR 2.92     Comment: Performed at [facility] [address] [place], [place] [address]  APTT     Status: None   Collection Time: 03/22/18 12:07 AM  Result Value Ref Range   aPTT 32 24 - 36 seconds    Comment: Performed at [facility] [address] [place], [place] [address]  Urinalysis, Routine w reflex microscopic     Status: Abnormal   Collection Time: 03/22/18  2:21 AM  Result Value Ref Range   Color, Urine YELLOW YELLOW   APPearance HAZY (A) CLEAR   Specific Gravity, Urine 1.014 1.005 - 1.030   pH 7.0 5.0 - 8.0   Glucose, UA 50 (A) NEGATIVE mg/dL   Hgb urine dipstick NEGATIVE NEGATIVE   Bilirubin Urine NEGATIVE NEGATIVE   Ketones, ur NEGATIVE NEGATIVE mg/dL   Protein, ur NEGATIVE NEGATIVE mg/dL   Nitrite POSITIVE (A) NEGATIVE   Leukocytes, UA TRACE (A) NEGATIVE   RBC / HPF 0-5 0 - 5 RBC/hpf   WBC, UA 11-20 0 - 5 WBC/hpf   Bacteria, UA MANY (A) NONE SEEN   Mucus PRESENT     Comment: Performed at [facility] [address] [place], [place] [address]  CBC     Status: Abnormal   Collection Time: 03/22/18  4:30 AM  Result  Value Ref Range   WBC 16.8 (H) 4.0 - 10.5 K/uL  RBC 3.13 (L) 3.87 - 5.11 MIL/uL   Hemoglobin 9.3 (L) 12.0 - 15.0 g/dL   HCT 29.3 (L) 36.0 - 46.0 %   MCV 93.6 78.0 - 100.0 fL   MCH 29.7 26.0 - 34.0 pg   MCHC 31.7 30.0 - 36.0 g/dL   RDW 13.5 11.5 - 15.5 %   Platelets 255 150 - 400 K/uL    Comment: Performed at [facility] [address] [place], [place] [address]  Basic metabolic panel     Status: Abnormal   Collection Time: 03/22/18  4:30 AM  Result Value Ref Range   Sodium 140 135 - 145 mmol/L   Potassium 5.2 (H) 3.5 - 5.1 mmol/L   Chloride 109 101 - 111 mmol/L   CO2 21 (L) 22 - 32 mmol/L   Glucose, Bld 231 (H) 65 - 99 mg/dL   BUN 44 (H) 6 - 20 mg/dL   Creatinine, Ser 1.49 (H) 0.44 - 1.00 mg/dL   Calcium 8.5 (L) 8.9 - 10.3 mg/dL   GFR calc non Af Amer 30 (L) >60 mL/min   GFR calc Af Amer 34 (L) >60 mL/min    Comment: (NOTE) The eGFR has been calculated using the CKD EPI equation. This calculation has not been validated in all clinical situations. eGFR's persistently <60 mL/min signify possible Chronic Kidney Disease.    Anion gap 10 5 - 15    Comment: Performed at [facility] [address], [place], [place] [address]  Protime-INR     Status: Abnormal   Collection Time: 03/22/18  4:30 AM  Result Value Ref Range   Prothrombin Time 32.3 (H) 11.4 - 15.2 seconds   INR 3.17     Comment: Performed at [facility] [address] [place], [place] [address]    Dg Chest 1 View  Result Date: 03/22/2018 CLINICAL DATA:  Closed fracture of the right hip.  Preop. EXAM: CHEST  1 VIEW COMPARISON:  05/18/2017 FINDINGS: Heart is top-normal in size. There is aortic atherosclerosis. No definite aneurysm. Left-sided pacemaker apparatus right atrial and right ventricular leads are redemonstrated. There is scarring overlying the left costophrenic angle and to a lesser degree the right costophrenic angle. Clearing of right-sided pleural effusions since previous exam. No  overt pulmonary edema or pulmonary consolidation. Intact bony thorax. 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 IMPRESSION: No pneumonia.  Bibasilar scarring.  Aortic atherosclerosis. Electronically Signed   By: Ashley Royalty M.D.   On: 03/22/2018 01:42   Dg Hip Unilat With Pelvis 2-3 Views Left  Result Date: 03/22/2018 CLINICAL DATA:  84 year old female with fall. EXAM: DG HIP (WITH OR WITHOUT PELVIS) 2-3V LEFT; DG HIP (WITH OR WITHOUT PELVIS) 2-3V RIGHT COMPARISON:  None. FINDINGS: There is a comminuted and mildly displaced intertrochanteric fracture of the right femur with mild proximal migration of the femoral diaphysis. There is no fracture or dislocation of the left hip. Sclerotic area involving the right inferior pubic ramus, age indeterminate, possibly chronic. Clinical correlation is recommended. There is advanced osteopenia. There is degenerative changes of the lower lumbar spine. The soft tissues are grossly unremarkable. IMPRESSION: Mildly displaced intertrochanteric fracture of the right femur. No dislocation. Electronically Signed   By: Anner Crete M.D.   On: 03/22/2018 01:39   Dg Hip Unilat  With Pelvis 2-3 Views Right  Result Date: 03/22/2018 CLINICAL DATA:  84 year old female with fall. EXAM: DG HIP (WITH OR WITHOUT PELVIS) 2-3V LEFT; DG HIP (WITH OR WITHOUT  PELVIS) 2-3V RIGHT COMPARISON:  None. FINDINGS: There is a comminuted and mildly displaced intertrochanteric fracture of the right femur with mild proximal migration of the femoral diaphysis. There is no fracture or dislocation of the left hip. Sclerotic area involving the right inferior pubic ramus, age indeterminate, possibly chronic. Clinical correlation is recommended. There is advanced osteopenia. There is degenerative changes of the lower lumbar spine. The soft tissues are grossly unremarkable. IMPRESSION: Mildly displaced intertrochanteric fracture of the right femur. No dislocation. Electronically Signed   By: Anner Crete M.D.   On: 03/22/2018 01:39    ROS Blood pressure (!) 108/36, pulse 72, temperature 98.4 F (36.9 C), temperature source Oral, resp. rate 11, height 5' 2"  (1.575 m), weight 140 lb (63.5 kg), SpO2 92 %. Physical Exam  Constitutional: She is oriented to person, place, and time. She appears well-developed and well-nourished.  HENT:  Head: Normocephalic and atraumatic.  Neck: Normal range of motion. Neck supple.  Cardiovascular: Normal rate.  Respiratory: Effort normal.  GI: Soft.  Musculoskeletal:       Right hip: She exhibits decreased range of motion, decreased strength, tenderness, bony tenderness and deformity.  Neurological: She is alert and oriented to person, place, and time.  Skin: Skin is warm and dry.  Psychiatric: She has a normal mood and affect.   The right leg is shortened and slightly externally rotated.  Skin around the right hip area is normal-appearing.  The right foot is well-perfused and the patient can move her toes and ankle.   Assessment/Plan: Complex intertrochanteric right proximal femur/hip fracture  Recommendation for a hip fracture such as this would be a surgical intervention with an intramedullary rod and hip screw.  This can be done under small incisions.  However surgery will need to be delayed for at least 24 to 48 hours since the patient has been on Coumadin and her INR is 2.9.  This will need to be lower in order to safely proceed with surgery from a bleeding standpoint and even having the possibility of having spinal anesthesia performed.  The patient will be admitted to the medical service for medical optimization in the interim.  I have communicated this with the patient as well.  We will  follow her closely and schedule surgery at the appropriate time when it is safe.  Obviously there is significant risks of surgery as well as nonoperative treatment in this patient population.  Mcarthur Rossetti 03/22/2018, 6:13 AM

## 2018-03-22 NOTE — Progress Notes (Signed)
     Subjective:   Call by hospitalist to assess patient. She is experiencing blood loss and Hgb is decreased.  Patient reports pain as marked.    Objective:   VITALS:  Temp:  [98.4 F (36.9 C)] 98.4 F (36.9 C) (05/11 2350) Pulse Rate:  [59-79] 67 (05/12 1445) Resp:  [10-28] 14 (05/12 1445) BP: (71-133)/(33-74) 86/36 (05/12 1445) SpO2:  [87 %-100 %] 100 % (05/12 1445) Weight:  [140 lb (63.5 kg)] 140 lb (63.5 kg) (05/11 2350)  Neurologically intact ABD soft Neurovascular intact Sensation intact distally Intact pulses distally Pulse right leg is normal.Ecchymosis right hip and proximal thigh, right knee and tibia and ankle and foot is without swelling.   LABS Recent Labs    03/22/18 0007 03/22/18 0430 03/22/18 0955 03/22/18 1335  HGB 11.0* 9.3* 8.3* 7.9*  WBC 17.7* 16.8*  --  12.1*  PLT 278 255  --  239   Recent Labs    03/22/18 0007 03/22/18 0430  NA 139 140  K 4.8 5.2*  CL 104 109  CO2 24 21*  BUN 45* 44*  CREATININE 1.37* 1.49*  GLUCOSE 234* 231*   Recent Labs    03/22/18 0430 03/22/18 1335  INR 3.17 1.57     Assessment/Plan:Right Intertrochanteric Hip Fracture Hypotension due to hypovolemia from blood loss. Sick Sinus syndrome, Afib.      IVF and resuscitation for blood loss anemia and hypotension. No other source of blood loss is apparent but low IVF rate 75 cc/hr In face of anticoagulation and comminuted intertrochanteric hip fracture may be the cause of significant blood loss.  Her coag studies are better.  Give blood at least 3 units of PRBCs for Hgb 7.3 from previous 11. Will probably need calcium replacement for large transfusion.   Basil Dess 03/22/2018, 3:07 PMPatient ID: Kylie Sanchez, female   DOB: 21-Apr-1925, 84 y.o.   MRN: [id number]

## 2018-03-22 NOTE — ED Notes (Signed)
Patient transported to CT 

## 2018-03-22 NOTE — ED Notes (Signed)
Pt soiled, cleaned and bed pads added

## 2018-03-22 NOTE — ED Notes (Signed)
Pt breakfast tray ordered.

## 2018-03-22 NOTE — ED Notes (Addendum)
Pt alert and oriented x4. Skin warm and dry. Respirations equal and unlabored. s1 and s2 heart sounds audible. 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 Pt denies pain when not moving, when trying to move, patient complains of right and left hip pain. Pt cannot recall how she fell, states she was trying to move.Petal pulse positive. Pt has externally rotated left leg with shortening. Family at bedside Pt cannot recall hitting head. Pt has bruising on right upper head.

## 2018-03-22 NOTE — Consult Note (Addendum)
Cardiology Consult    Patient ID: Kylie Sanchez; [id number]; 05-03-1925   Admit date: 03/21/2018 Date of Consult: 03/22/2018  Primary Care Provider: Unk Pinto, MD Primary Cardiologist: Kylie Grayer, MD   Patient Profile    Kylie Sanchez is a 84 y.o. 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   History of Present Illness    Kylie Sanchez was last examined by Kylie Sanchez and reported overall doing well from a cardiac perspective at that time, denying any recent anginal symptoms. Her EF had normalized by repeat echocardiogram, therefore she was continued on her current medication regimen.   She presented to [facility] late yesterday evening after having a mechanical fall while ambulating. In talking with the patient, her caregiver, and family at the bedside, they report that she is active for her age. She ambulates with a walker around her home and is able to perform ADL's independently. Does not exercise or go to the grocery store regularly. The most active thing she does regularly is walking from the car to doctor's offices and walking from the car to her hair dresser. She denies any recent chest pain, dyspnea on exertion, orthopnea, PND, or lower extremity edema. No recent palpitations, dizziness, or presyncope. Denies any actual syncope with her recent fall as she was carrying a bowl of ice cream without utilizing her walker and lost her balance.  Initial labs show WBC of 17.7, Hgb 11.0, platelets 278, Na+ 139, K+ 4.8, and creatinine 1.37. INR 2.92. UA  consistent with UTI. Plain films of right hip show a mildly displaced intertrochanteric fracture of the right femur. CXR showing bibasilar scaring and aortic atherosclerosis with no PNA. CT Head showing no acute intracranial abnormalities with a right parietal scalp swelling hematoma identified. EKG shows NSR, HR 61, with no acute ST or T-wave changes when compared to prior tracings.   Orthopedics is following and planning for surgical repair with an intramedullary rod and hip screw.  Vitamin K has been administered with repeat INR pending. Most recent labs did show Hgb had declined to 8.3 and 1 unit pRBC's has been ordered by the admitting team. BP has been variable since admission at 75/36 - 133/74 with IVF having been administered.   Past Medical History:  Diagnosis Date  . Cancer ([place]) 2015   left eye  . Cataract   . CKD (chronic kidney disease) stage 3, GFR 30-59 ml/min (HCC)    due to DM  . Dizziness   . Hyperlipidemia   . Hypertension   . Hypertensive cardiovascular disease   . Impaired vision   . Memory loss   . Paroxysmal atrial fibrillation (HCC)   . Presence of permanent cardiac pacemaker   . Retinopathy   . Sick sinus syndrome ([place])   . Type II or unspecified type diabetes mellitus without mention of complication, not stated as uncontrolled     Past Surgical History:  Procedure Laterality Date  . DENTAL SURGERY    . ENDARTERECTOMY Right 05/13/2017   Procedure: REDO ENDARTERECTOMY CAROTID Resection Redundant Internal Corotid Artery.;  Surgeon: Angelia Mould,  MD;  Location: [facility];  Service: Vascular;  Laterality: Right;  . ENDARTERECTOMY Right 05/13/2017   Procedure: ENDARTERECTOMY CAROTID-RIGHT;  Surgeon: Serafina Mitchell, MD;  Location: [facility] OR;  Service: Vascular;  Laterality: Right;  . EYE SURGERY Left    tumor and eye removed  . PACEMAKER INSERTION  04/20/12   MDT Adapta L implanted by Dr Rayann Heman  . PATCH ANGIOPLASTY Right 05/13/2017   Procedure: PATCH ANGIOPLASTY  USING Rueben Bash BIOLOGIC PATCH;  Surgeon: Serafina Mitchell, MD;  Location[facility] OR;  Service: Vascular;  Laterality: Right;  . PERMANENT PACEMAKER INSERTION N/A 04/20/2012   Procedure: PERMANENT PACEMAKER INSERTION;  Surgeon: Kylie Grayer, MD;  Location: [facility] CATH LAB;  Service: Cardiovascular;  Laterality: N/A;     Home Medications:  Prior to Admission medications   Medication Sig Start Date End Date Taking? Authorizing Provider  aspirin EC 81 MG EC tablet Take 1 tablet (81 mg total) by mouth daily. 05/24/17  Yes Alvia Grove, PA-C  atorvastatin (LIPITOR) 40 MG tablet Take 1 tablet (40 mg total) by mouth daily at 6 PM. 10/28/17  Yes Kylie Pinto, MD  b complex vitamins tablet Take 1 tablet by mouth daily.   Yes [provider]  Cholecalciferol (VITAMIN D PO) Take 1 tablet by mouth every evening.    Yes [provider]  Coenzyme Q10 (CO Q 10) 100 MG CAPS Take 100 mg by mouth every evening.    Yes [provider]  escitalopram (LEXAPRO) 10 MG tablet TAKE ONE (1) TABLET BY MOUTH EVERY DAY Patient taking differently: Take 5 mg by mouth daily.  11/17/17  Yes Vicie Mutters, PA-C  furosemide (LASIX) 20 MG tablet Take 1 tablet daily for BP & Fluid 02/23/18  Yes Kylie Pinto, MD  glimepiride (AMARYL) 2 MG tablet TAKE ONE (1) TABLET BY MOUTH EVERY DAY 11/05/17  Yes Kylie Pinto, MD  metFORMIN (GLUCOPHAGE-XR) 500 MG 24 hr tablet Take 1 tablet (500 mg total) by mouth every morning. Take with meal for diabetes 11/05/17  Yes Kylie Pinto, MD  metoprolol succinate (TOPROL-XL) 100 MG 24 hr tablet TAKE ONE (1) TABLET BY MOUTH TWO (2) TIMES DAILY WITH OR IMMEDIATELY FOLLOWING A MEAL Patient taking differently: Take 100 mg by mouth 2 (two) times daily.  10/30/17  Yes Allred, Jeneen Rinks, MD  nitroGLYCERIN (NITROSTAT) 0.4 MG SL tablet Place 1 tablet (0.4 mg total) under the tongue every 5 (five) minutes as needed for chest pain. 10/30/17 01/28/19 Yes Allred, Jeneen Rinks, MD  warfarin  (COUMADIN) 4 MG tablet Take as directed by [facility] Patient taking differently: Take 2-4 mg by mouth See admin instructions. Take 1 tablet (4 mg totally) by mouth on Mon, Wed, Thur, Fri; take 0.5 tablet (2 mg totally) by mouth on Tues and Sat. 10/30/17  Yes Allred, Jeneen Rinks, MD  blood glucose meter kit and supplies Test sugars once daily. Dispense based insurance preference. E11.9 11/20/17   Vicie Mutters, PA-C  glucose blood test strip Check sugars once to twice a day for diabetes E11.21 11/17/17   Vicie Mutters, PA-C    Inpatient Medications: Scheduled Meds: . atorvastatin  40 mg Oral q1800  . B-complex with vitamin C  1 tablet Oral Daily  . docusate sodium  100 mg Oral BID  . escitalopram  5 mg Oral Daily  . insulin aspart  0-15 Units Subcutaneous TID WC  . magnesium oxide  400 mg Oral QPM   Continuous Infusions: . cefTRIAXone (ROCEPHIN)  IV    . cefTRIAXone (  ROCEPHIN)  IV Stopped (03/22/18 0529)   PRN Meds: HYDROcodone-acetaminophen, morphine injection, nitroGLYCERIN  Allergies:    Allergies  Allergen Reactions  . No Known Allergies     Social History:   Social History   Socioeconomic History  . Marital status: Single    Spouse name: Not on file  . Number of children: 0  . Years of education: 11 years  . Highest education level: High school graduate  Occupational History  . Occupation: Retired  Scientific laboratory technician  . Financial resource strain: Not on file  . Food insecurity:    Worry: Not on file    Inability: Not on file  . Transportation needs:    Medical: Not on file    Non-medical: Not on file  Tobacco Use  . Smoking status: Never Smoker  . Smokeless tobacco: Never Used  Substance and Sexual Activity  . Alcohol use: No  . Drug use: No  . Sexual activity: Not on file  Lifestyle  . Physical activity:    Days per week: Not on file    Minutes per session: Not on file  . Stress: Not on file  Relationships  . Social connections:    Talks on phone: Not on  file    Gets together: Not on file    Attends religious service: Not on file    Active member of club or organization: Not on file    Attends meetings of clubs or organizations: Not on file    Relationship status: Not on file  . Intimate partner violence:    Fear of current or ex partner: Not on file    Emotionally abused: Not on file    Physically abused: Not on file    Forced sexual activity: Not on file  Other Topics Concern  . Not on file  Social History Narrative   Lives at home with her brother.  She has a caregiver in the home from 9am to 6:30pm.   Left-handed.   2 cups caffeine per day.     Family History:    Family History  Problem Relation Age of Onset  . Heart attack Father   . Heart disease Father   . Hypertension Mother   . Heart disease Mother   . Cancer Other       Review of Systems    General:  No chills, fever, night sweats or weight changes. Positive for right hip pain.  Cardiovascular:  No chest pain, dyspnea on exertion, edema, orthopnea, palpitations, paroxysmal nocturnal dyspnea. Dermatological: No rash, lesions/masses Respiratory: No cough, dyspnea Urologic: No hematuria, dysuria Abdominal:   No nausea, vomiting, diarrhea, bright red blood per rectum, melena, or hematemesis Neurologic:  No visual changes, wkns, changes in mental status.  All other systems reviewed and are otherwise negative except as noted above.  Physical Exam/Data    Vitals:   03/22/18 0930 03/22/18 1030 03/22/18 1130 03/22/18 1200  BP: (!) 86/40 93/74 (!) 110/44 (!) 97/43  Pulse: 66 79 67 67  Resp: 16  (!) 21 16  Temp:      TempSrc:      SpO2: 98% 100% 100% 99%  Weight:      Height:        Intake/Output Summary (Last 24 hours) at 03/22/2018 1237 Last data filed at 03/22/2018 0529 Gross per 24 hour  Intake 600 ml  Output -  Net 600 ml   Filed Weights   03/21/18 2350  Weight: 140 lb (63.5 kg)  Body mass index is 25.61 kg/m.   General: Pleasant, elderly  Caucasian female appearing in NAD. Does appear pale.  Psych: Normal affect. Neuro: Alert and oriented X 3. Moves all extremities spontaneously. HEENT: surgically resected left eye.   Neck: Supple without bruits or JVD. Lungs:  Resp regular and unlabored, CTA without wheezing or rales. Heart: RRR no s3, s4, or murmurs. Abdomen: Soft, non-tender, non-distended, BS + x 4.  Extremities:  Right hip is tender.   There is firmness around her buttocks which is likely a hematoma.   Associated with some bruising    EKG:  The EKG was personally reviewed and demonstrates:  NSR, HR 61, with no acute ST or T-wave changes when compared to prior tracings.     Labs/Studies     Relevant CV Studies:  Echocardiogram: 06/2017 Study Conclusions  - Left ventricle: The cavity size was normal. Wall thickness was   normal. Systolic function was normal. The estimated ejection   fraction was in the range of 60% to 65%. Wall motion was normal;   there were no regional wall motion abnormalities. Doppler   parameters are consistent with abnormal left ventricular   relaxation (grade 1 diastolic dysfunction). The E/e&' ratio is   between 8-15, suggesting indeterminate LV filling pressure. - Mitral valve: Mildly thickened leaflets . There was mild   regurgitation. - Left atrium: Moderately dilated. - Tricuspid valve: There was mild regurgitation. - Pulmonary arteries: PA peak pressure: 34 mm Hg (S). - Inferior vena cava: The vessel was normal in size. The   respirophasic diameter changes were in the normal range (= 50%),   consistent with normal central venous pressure.  Impressions:  - Compared to a prior study in 05/2017, the LVEF has normalized.  Laboratory Data:  Chemistry Recent Labs  Lab 03/22/18 0007 03/22/18 0430  NA 139 140  K 4.8 5.2*  CL 104 109  CO2 24 21*  GLUCOSE 234* 231*  BUN 45* 44*  CREATININE 1.37* 1.49*  CALCIUM 9.0 8.5*  GFRNONAA 33* 30*  GFRAA 38* 34*  ANIONGAP 11 10      Recent Labs  Lab 03/22/18 0007  PROT 6.3*  ALBUMIN 3.2*  AST 25  ALT 26  ALKPHOS 76  BILITOT 0.4   Hematology Recent Labs  Lab 03/22/18 0007 03/22/18 0430 03/22/18 0955  WBC 17.7* 16.8*  --   RBC 3.65* 3.13*  --   HGB 11.0* 9.3* 8.3*  HCT 33.8* 29.3* 26.2*  MCV 92.6 93.6  --   MCH 30.1 29.7  --   MCHC 32.5 31.7  --   RDW 13.4 13.5  --   PLT 278 255  --    Cardiac EnzymesNo results for input(s): TROPONINI in the last 168 hours. No results for input(s): TROPIPOC in the last 168 hours.  BNPNo results for input(s): BNP, PROBNP in the last 168 hours.  DDimer No results for input(s): DDIMER in the last 168 hours.  Radiology/Studies:  Dg Chest 1 View  Result Date: 03/22/2018 CLINICAL DATA:  Closed fracture of the right hip.  Preop. EXAM: CHEST  1 VIEW COMPARISON:  05/18/2017 FINDINGS: Heart is top-normal in size. There is aortic atherosclerosis. No definite aneurysm. Left-sided pacemaker apparatus right atrial and right ventricular leads are redemonstrated. There is scarring overlying the left costophrenic angle and to a lesser degree the right costophrenic angle. Clearing of right-sided pleural effusions since previous exam. No overt pulmonary edema or pulmonary consolidation. Intact bony thorax. Osteoarthritis of the glenohumeral and  AC joints. IMPRESSION: No pneumonia.  Bibasilar scarring.  Aortic atherosclerosis. Electronically Signed   By: Ashley Royalty M.D.   On: 03/22/2018 01:42   Ct Head Wo Contrast  Result Date: 03/22/2018 CLINICAL DATA:  Status post fall last night. EXAM: CT HEAD WITHOUT CONTRAST TECHNIQUE: Contiguous axial images were obtained from the base of the skull through the vertex without intravenous contrast. COMPARISON:  May 13, 2017 FINDINGS: Brain: No evidence of acute infarction, hemorrhage, hydrocephalus, extra-axial collection or mass lesion/mass effect. There is chronic diffuse atrophy. Chronic bilateral periventricular white matter small vessel ischemic  changes noted. Small old right basal ganglia lacunar infarction is identified unchanged. Vascular: No hyperdense vessel or unexpected calcification. 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 Sinuses/Orbits: No acute abnormality. 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 Other: Right parietal scalp swelling and hematoma is noted. IMPRESSION: No focal acute intracranial abnormality identified. Right parietal scalp swelling hematoma is identified. Electronically Signed   By: Abelardo Diesel M.D.   On: 03/22/2018 07:53   Dg Hip Unilat With Pelvis 2-3 Views Left  Result Date: 03/22/2018 CLINICAL DATA:  84 year old female with fall. EXAM: DG HIP (WITH OR WITHOUT PELVIS) 2-3V LEFT; DG HIP (WITH OR WITHOUT PELVIS) 2-3V RIGHT COMPARISON:  None. FINDINGS: There is a comminuted and mildly displaced intertrochanteric fracture of the right femur with mild proximal migration of the femoral diaphysis. There is no fracture or dislocation of the left hip. Sclerotic area involving the right inferior pubic ramus, age indeterminate, possibly chronic. Clinical correlation is recommended. There is advanced osteopenia. There is degenerative changes of the lower lumbar spine. The soft tissues are grossly unremarkable. IMPRESSION: Mildly displaced intertrochanteric fracture of the right femur. No dislocation. Electronically Signed   By: Anner Crete M.D.   On: 03/22/2018 01:39   Dg Hip Unilat  With Pelvis 2-3 Views Right  Result Date: 03/22/2018 CLINICAL DATA:  84 year old female with fall. EXAM: DG HIP (WITH OR WITHOUT PELVIS) 2-3V LEFT; DG HIP (WITH OR WITHOUT PELVIS) 2-3V RIGHT COMPARISON:  None. FINDINGS: There is a comminuted and mildly displaced intertrochanteric fracture of the right femur with mild proximal migration of the femoral diaphysis. There is no fracture or dislocation of the left hip. Sclerotic area involving the right inferior pubic  ramus, age indeterminate, possibly chronic. Clinical correlation is recommended. There is advanced osteopenia. There is degenerative changes of the lower lumbar spine. The soft tissues are grossly unremarkable. IMPRESSION: Mildly displaced intertrochanteric fracture of the right femur. No dislocation. Electronically Signed   By: Anner Crete M.D.   On: 03/22/2018 01:39     Assessment & Plan    1. Preoperative Cardiac Clearance for Surgical Repair of Right Hip - patient presents with a mechanical fall and was found to have a displaced intertrochanteric fracture of the right hip. Planning to undergo surgical repair with an intramedullary rod and hip screw once INR allows.  - she is not overly active at baseline in the setting of her advanced age but does deny any recent episodes of chest pain or dyspnea on exertion with ambulation. Most recent echo showed her EF was improved to 60-65% with no regional WMA. EKG this admission shows NSR with no acute ST or T-wave changes when compared to prior tracings.  - would not anticipate further cardiac workup prior to her upcoming surgery given no recent anginal symptoms and recent echo showing normal LV function. She is of intermediate risk from a  cardiac perspective given her advanced age. Agree with transfusion in the setting of her declining Hgb. Continue to follow on telemetry as she is at higher risk of having episodes of atrial fibrillation with RVR given history of PAF.   2. Paroxysmal Atrial Fibrillation - on Coumadin for anticoagulation PTA. Currently held in anticipation of upcoming surgery. Once INR < 2.0, can consider bridging with Heparin if surgery is delayed but would not do so at this time given her declining Hgb.  - PTA Toprol-XL held due to hypotension.   3. SSS - s/p Medtronic PPM placement in 2013. - followed by Dr. Rayann Heman as an outpatient.   4. History of Tachycardia-mediated Cardiomyopathy - EF 35% by echo in 05/2017, improved to  60-65% by repeat imaging in 06/2017. - she has received multiple IVF bolus due to hypotension. Would monitor volume status closely. Does not appear volume overloaded at this time of this encounter.   5. UTI - has been started on Ceftriaxone.  - per admitting team.   For questions or updates, please contact [place] Please consult [email or web address] for contact info under Cardiology/STEMI.  Signed, Erma Heritage, PA-C 03/22/2018, 12:37 PM Pager: [phone]   Attending Note:   The patient was seen and examined.  Agree with assessment and plan as noted above.  Changes made to the above note as needed.  Patient seen and independently examined with  Bernerd Pho, [place] .   We discussed all aspects of the encounter. I agree with the assessment and plan as stated above.  1.  Preoperative risk prior to surgery: The patient has a history of rate related cardiomyopathy but her last ejection fraction has been stable.  2.  Paroxysmal atrial fibrillation: She is currently in normal sinus rhythm.  Her INR is 3.1.  She has received vitamin K.  She may also need FFP but will leave this up to the internal medicine team and the surgical team.  3.  Sick sinus syndrome: She is status post Medtronic pacemaker in 2013.  4.  History of chronic systolic congestive heart failure: She had a tachycardia mediated cardiomyopathy.  With rate control her ejection fraction is normalized.   I have spent a total of 40 minutes with patient reviewing hospital  notes , telemetry, EKGs, labs and examining patient as well as establishing an assessment and plan that was discussed with the patient. > 50% of time was spent in direct patient care.    Thayer Headings, Brooke Bonito., MD, [facility] 03/22/2018, 2:07 PM [address]  [place] Pager [phone]

## 2018-03-22 NOTE — ED Notes (Signed)
MD aware of pt BP and possibly new bed request

## 2018-03-22 NOTE — ED Notes (Signed)
ED MD asked to eval pt as pt is pale and low BP. MD to place orders for repeat hgb and fluid bolus. Pt respiratory status intact.

## 2018-03-22 NOTE — ED Notes (Signed)
CCMD at bedside to evaluate pt

## 2018-03-22 NOTE — ED Notes (Signed)
MD made aware of pt BP

## 2018-03-22 NOTE — ED Notes (Signed)
Pt hypotensive, provider notified. 500cc bolus verbal order by Dr. Tomi Bamberger to give.

## 2018-03-22 NOTE — ED Provider Notes (Signed)
[facility] EMERGENCY DEPARTMENT Provider Note   CSN: [id number] Arrival date & time: 03/21/18  2347  Time seen 01:10 AM   History   Chief Complaint No chief complaint on file.  Level 5 caveat for age  HPI Kylie Sanchez is a 84 y.o. female.  HPI patient states her "feet got tangled up" and she fell tonight.  Her family member states she was not using her walker like she supposed to.  She states she fell on a hardwood floor.  She denies hitting her head, she denies headache, neck pain, or back pain.  Patient is on Coumadin and has a pacemaker and history of atrial fibrillation.  She is complaining of pain in her right hip area.  She was given fentanyl by EMS.  PCP Unk Pinto, MD Orthopedics none  Past Medical History:  Diagnosis Date  . Cancer ([place]) 2015   left eye  . Cataract   . CKD (chronic kidney disease) stage 3, GFR 30-59 ml/min (HCC)    due to DM  . Dizziness   . Hyperlipidemia   . Hypertension   . Hypertensive cardiovascular disease   . Impaired vision   . Memory loss   . Paroxysmal atrial fibrillation (HCC)   . Presence of permanent cardiac pacemaker   . Retinopathy   . Sick sinus syndrome ([place])   . Type II or unspecified type diabetes mellitus without mention of complication, not stated as uncontrolled     Patient Active Problem List   Diagnosis Date Noted  . Mild cognitive impairment 01/22/2018  . Afib ([place]) 11/17/2017  . Depression, major, recurrent, in partial remission ([place]) 11/17/2017  . Palliative care encounter   . Goals of care, counseling/discussion   . Pressure injury of skin 05/22/2017  . Dilated cardiomyopathy ([place]) 05/20/2017  . Elevated troponin- unclear etiology 05/19/2017  . Asymptomatic stenosis of right carotid artery 05/13/2017  . H/O enucleation of left eyeball 10/30/2015  . Macular degeneration, right eye 10/30/2015  . Pseudophakia, right eye 10/30/2015  . Basal cell carcinoma 02/10/2015  . DM neuropathy,  type II diabetes mellitus ([place]) 02/10/2015  . Persistent atrial fibrillation ([place]) 02/03/2015  . Type 2 diabetes mellitus with hyperlipidemia ([place]) 11/24/2014  . T2_NIDDM w/Stage 3 CKD (GFR 40 ml/min) 11/08/2014  . Vitamin D deficiency 11/08/2014  . Medication management 11/08/2014  . BP (high blood pressure) 11/08/2014  . Hyperlipidemia   . Sick sinus syndrome ([place])   . Retinopathy   . Long term current use of anticoagulant therapy 04/30/2012  . Tachycardia-bradycardia ([place]) 04/19/2012  . HCD (hypertensive cardiovascular disease) 04/18/2011    Past Surgical History:  Procedure Laterality Date  . DENTAL SURGERY    . ENDARTERECTOMY Right 05/13/2017   Procedure: REDO ENDARTERECTOMY CAROTID Resection Redundant Internal Corotid Artery.;  Surgeon: Angelia Mould, MD;  Location: [facility];  Service: Vascular;  Laterality: Right;  . ENDARTERECTOMY Right 05/13/2017   Procedure: ENDARTERECTOMY CAROTID-RIGHT;  Surgeon: Serafina Mitchell, MD;  Location: [facility] OR;  Service: Vascular;  Laterality: Right;  . EYE SURGERY Left    tumor and eye removed  . PACEMAKER INSERTION  04/20/12   MDT Adapta L implanted by Dr Rayann Heman  . PATCH ANGIOPLASTY Right 05/13/2017   Procedure: PATCH ANGIOPLASTY USING Rueben Bash BIOLOGIC PATCH;  Surgeon: Serafina Mitchell, MD;  [facility] OR;  Service: Vascular;  Laterality: Right;  . PERMANENT PACEMAKER INSERTION N/A 04/20/2012   Procedure: PERMANENT PACEMAKER INSERTION;  Surgeon: Thompson Grayer, MD;  Location: [facility] CATH  LAB;  Service: Cardiovascular;  Laterality: N/A;     OB History   None      Home Medications    Prior to Admission medications   Medication Sig Start Date End Date Taking? Authorizing Provider  aspirin EC 81 MG EC tablet Take 1 tablet (81 mg total) by mouth daily. 05/24/17   Alvia Grove, PA-C  atorvastatin (LIPITOR) 40 MG tablet Take 1 tablet (40 mg total) by mouth daily at 6 PM. 10/28/17   Unk Pinto, MD  b complex vitamins tablet Take 1 tablet  by mouth daily.    [provider]  blood glucose meter kit and supplies Test sugars once daily. Dispense based insurance preference. E11.9 11/20/17   Vicie Mutters, PA-C  Cholecalciferol (VITAMIN D PO) Take 1 tablet by mouth every evening.     [provider]  Coenzyme Q10 (CO Q 10) 100 MG CAPS Take 100 mg by mouth every evening.     [provider]  escitalopram (LEXAPRO) 10 MG tablet TAKE ONE (1) TABLET BY MOUTH EVERY DAY Patient taking differently: 5 mg. TAKE ONE (1) TABLET BY MOUTH EVERY DAY 11/17/17   Vicie Mutters, PA-C  furosemide (LASIX) 20 MG tablet Take 1 tablet daily for BP & Fluid 02/23/18   Unk Pinto, MD  glimepiride (AMARYL) 2 MG tablet TAKE ONE (1) TABLET BY MOUTH EVERY DAY 11/05/17   Unk Pinto, MD  glucose blood test strip Check sugars once to twice a day for diabetes E11.21 11/17/17   Vicie Mutters, PA-C  Magnesium 250 MG TABS Take 250 mg by mouth every evening.     [provider]  metFORMIN (GLUCOPHAGE-XR) 500 MG 24 hr tablet Take 1 tablet (500 mg total) by mouth every morning. Take with meal for diabetes 11/05/17   Unk Pinto, MD  metoprolol succinate (TOPROL-XL) 100 MG 24 hr tablet TAKE ONE (1) TABLET BY MOUTH TWO (2) TIMES DAILY WITH OR IMMEDIATELY FOLLOWING A MEAL 10/30/17   Allred, Jeneen Rinks, MD  nitroGLYCERIN (NITROSTAT) 0.4 MG SL tablet Place 1 tablet (0.4 mg total) under the tongue every 5 (five) minutes as needed for chest pain. 10/30/17 01/28/19  Allred, Jeneen Rinks, MD  warfarin (COUMADIN) 4 MG tablet Take as directed by [facility] 10/30/17   Thompson Grayer, MD    Family History Family History  Problem Relation Age of Onset  . Heart attack Father   . Heart disease Father   . Hypertension Mother   . Heart disease Mother   . Cancer Other     Social History Social History   Tobacco Use  . Smoking status: Never Smoker  . Smokeless tobacco: Never Used  Substance Use Topics  . Alcohol use: No  . Drug use: No    lives at home Lives with brother Uses a walker   Allergies   No known allergies   Review of Systems Review of Systems  Unable to perform ROS: Age     Physical Exam Updated Vital Signs BP (!) 75/47   Pulse 64   Temp 98.4 F (36.9 C) (Oral)   Resp 20   Ht 5' 2"  (1.575 m)   Wt 63.5 kg (140 lb)   SpO2 100%   BMI 25.61 kg/m   Vital signs normal except for hypotension   Physical Exam  Constitutional: She is oriented to person, place, and time.  Non-toxic appearance. She does not appear ill. No distress.  Elderly female  HENT:  Head: Normocephalic and atraumatic.  Right Ear:  External ear normal.  Left Ear: External ear normal.  Nose: Nose normal. No mucosal edema or rhinorrhea.  Mouth/Throat: Oropharynx is clear and moist and mucous membranes are normal. No dental abscesses or uvula swelling.  Her head is nontender to palpation  Eyes:  Patient has had her left eye enucleated, there is a hole seen medially that appears to be lower than the orbital rim.  She appears to have had a lens implant in her right eye.  Her family member states she cannot see well.  Neck: Normal range of motion and full passive range of motion without pain. Neck supple.  Her neck is nontender to palpation  Cardiovascular: Normal rate, regular rhythm and normal heart sounds. Exam reveals no gallop and no friction rub.  No murmur heard. Pulmonary/Chest: Effort normal and breath sounds normal. No respiratory distress. She has no wheezes. She has no rhonchi. She has no rales. She exhibits no tenderness and no crepitus.  Abdominal: Soft. Normal appearance and bowel sounds are normal. She exhibits no distension. There is no tenderness. There is no rebound and no guarding.  Musculoskeletal: Normal range of motion. She exhibits tenderness. She exhibits no edema.  Patient's right lower extremity itch she is slightly shorter than the left, pulses are intact.  There is no internal or external rotation.   Neurological: She is alert and oriented to person, place, and time. She has normal strength. No cranial nerve deficit.  Skin: Skin is warm, dry and intact. No rash noted. No erythema. There is pallor.  Patient is extremely pale including her mucous membranes and conjunctiva and palms of her hands.  Psychiatric: She has a normal mood and affect. Her speech is normal and behavior is normal. Her mood appears not anxious.  Nursing note and vitals reviewed.    ED Treatments / Results  Labs (all labs ordered are listed, but only abnormal results are displayed) Results for orders placed or performed during the hospital encounter of 03/21/18  Comprehensive metabolic panel  Result Value Ref Range   Sodium 139 135 - 145 mmol/L   Potassium 4.8 3.5 - 5.1 mmol/L   Chloride 104 101 - 111 mmol/L   CO2 24 22 - 32 mmol/L   Glucose, Bld 234 (H) 65 - 99 mg/dL   BUN 45 (H) 6 - 20 mg/dL   Creatinine, Ser 1.37 (H) 0.44 - 1.00 mg/dL   Calcium 9.0 8.9 - 10.3 mg/dL   Total Protein 6.3 (L) 6.5 - 8.1 g/dL   Albumin 3.2 (L) 3.5 - 5.0 g/dL   AST 25 15 - 41 U/L   ALT 26 14 - 54 U/L   Alkaline Phosphatase 76 38 - 126 U/L   Total Bilirubin 0.4 0.3 - 1.2 mg/dL   GFR calc non Af Amer 33 (L) >60 mL/min   GFR calc Af Amer 38 (L) >60 mL/min   Anion gap 11 5 - 15  CBC with Differential  Result Value Ref Range   WBC 17.7 (H) 4.0 - 10.5 K/uL   RBC 3.65 (L) 3.87 - 5.11 MIL/uL   Hemoglobin 11.0 (L) 12.0 - 15.0 g/dL   HCT 33.8 (L) 36.0 - 46.0 %   MCV 92.6 78.0 - 100.0 fL   MCH 30.1 26.0 - 34.0 pg   MCHC 32.5 30.0 - 36.0 g/dL   RDW 13.4 11.5 - 15.5 %   Platelets 278 150 - 400 K/uL   Neutrophils Relative % 83 %   Neutro Abs 14.6 (H) 1.7 - 7.7  K/uL   Lymphocytes Relative 12 %   Lymphs Abs 2.1 0.7 - 4.0 K/uL   Monocytes Relative 4 %   Monocytes Absolute 0.7 0.1 - 1.0 K/uL   Eosinophils Relative 1 %   Eosinophils Absolute 0.3 0.0 - 0.7 K/uL   Basophils Relative 0 %   Basophils Absolute 0.0 0.0 - 0.1 K/uL   Protime-INR  Result Value Ref Range   Prothrombin Time 30.3 (H) 11.4 - 15.2 seconds   INR 2.92   APTT  Result Value Ref Range   aPTT 32 24 - 36 seconds  Urinalysis, Routine w reflex microscopic  Result Value Ref Range   Color, Urine YELLOW YELLOW   APPearance HAZY (A) CLEAR   Specific Gravity, Urine 1.014 1.005 - 1.030   pH 7.0 5.0 - 8.0   Glucose, UA 50 (A) NEGATIVE mg/dL   Hgb urine dipstick NEGATIVE NEGATIVE   Bilirubin Urine NEGATIVE NEGATIVE   Ketones, ur NEGATIVE NEGATIVE mg/dL   Protein, ur NEGATIVE NEGATIVE mg/dL   Nitrite POSITIVE (A) NEGATIVE   Leukocytes, UA TRACE (A) NEGATIVE   RBC / HPF 0-5 0 - 5 RBC/hpf   WBC, UA 11-20 0 - 5 WBC/hpf   Bacteria, UA MANY (A) NONE SEEN   Mucus PRESENT     Laboratory interpretation all normal except renal insufficiency, probable UTI, anemia, leukocytosis   EKG  EKG Interpretation  Date/Time:  Sunday Mar 22 2018 02:04:02 EDT Ventricular Rate:  61 PR Interval:    QRS Duration: 92 QT Interval:  471 QTC Calculation: 475 R Axis:   60 Text Interpretation:  Sinus rhythm Since last tracing 19 May 2017 Normal sinus rhythm has replaced Atrial fibrillation heart rate is lower Confirmed by Rolland Porter [phone]) on 03/22/2018 3:19:18 AM   Radiology Dg Chest 1 View  Result Date: 03/22/2018 CLINICAL DATA:  Closed fracture of the right hip.  Preop. EXAM: CHEST  1 VIEW COMPARISON:  05/18/2017 FINDINGS: Heart is top-normal in size. There is aortic atherosclerosis. No definite aneurysm. Left-sided pacemaker apparatus right atrial and right ventricular leads are redemonstrated. There is scarring overlying the left costophrenic angle and to a lesser degree the right costophrenic angle. Clearing of right-sided pleural effusions since previous exam. No overt pulmonary edema or pulmonary consolidation. Intact bony thorax. 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 IMPRESSION: No pneumonia.  Bibasilar scarring.  Aortic atherosclerosis.  Electronically Signed   By: Ashley Royalty M.D.   On: 03/22/2018 01:42   Dg Hip Unilat With Pelvis 2-3 Views Left  Result Date: 03/22/2018 CLINICAL DATA:  84 year old female with fall. EXAM: DG HIP (WITH OR WITHOUT PELVIS) 2-3V LEFT; DG HIP (WITH OR WITHOUT PELVIS) 2-3V RIGHT COMPARISON:  None. FINDINGS: There is a comminuted and mildly displaced intertrochanteric fracture of the right femur with mild proximal migration of the femoral diaphysis. There is no fracture or dislocation of the left hip. Sclerotic area involving the right inferior pubic ramus, age indeterminate, possibly chronic. Clinical correlation is recommended. There is advanced osteopenia. There is degenerative changes of the lower lumbar spine. The soft tissues are grossly unremarkable. IMPRESSION: Mildly displaced intertrochanteric fracture of the right femur. No dislocation. Electronically Signed   By: Anner Crete M.D.   On: 03/22/2018 01:39   Dg Hip Unilat  With Pelvis 2-3 Views Right  Result Date: 03/22/2018 CLINICAL DATA:  84 year old female with fall. EXAM: DG HIP (WITH OR WITHOUT PELVIS) 2-3V LEFT; DG HIP (WITH OR WITHOUT PELVIS) 2-3V RIGHT COMPARISON:  None. FINDINGS: There is  a comminuted and mildly displaced intertrochanteric fracture of the right femur with mild proximal migration of the femoral diaphysis. There is no fracture or dislocation of the left hip. Sclerotic area involving the right inferior pubic ramus, age indeterminate, possibly chronic. Clinical correlation is recommended. There is advanced osteopenia. There is degenerative changes of the lower lumbar spine. The soft tissues are grossly unremarkable. IMPRESSION: Mildly displaced intertrochanteric fracture of the right femur. No dislocation. Electronically Signed   By: Anner Crete M.D.   On: 03/22/2018 01:39    Procedures Procedures (including critical care time)  Medications Ordered in ED Medications  sodium chloride 0.9 % bolus 500 mL (500 mLs  Intravenous New Bag/Given 03/22/18 0305)  cefTRIAXone (ROCEPHIN) 1 g in sodium chloride 0.9 % 100 mL IVPB (has no administration in time range)  morphine 4 MG/ML injection 4 mg (4 mg Intravenous Given 03/22/18 0221)     Initial Impression / Assessment and Plan / ED Course  I have reviewed the triage vital signs and the nursing notes.  Pertinent labs & imaging results that were available during my care of the patient were reviewed by me and considered in my medical decision making (see chart for details).    We discussed her x-ray result which does show a intertrochanteric fracture, hopefully this will be able to be pinned and plated instead of a hip replacement.  She is on Coumadin but denies hitting her head and denies having any headache.  At this point CT of the head was not done.  She was given pain medication.  Laboratory testing was done for preop.  We discussed that she would be admitted to medicine service and orthopedics would see her once her INR has normalized.  I am concerned as to how pale she is that may be she is anemic, she may have some occult GI bleeding and with her poor eyesight she may not be aware of it.  Patient was given morphine for pain.  Laboratory testing was done for preop including EKG, Foley, urinalysis.  After reviewing her lab work, patient's INR noted to be 2.9.  3:03 AM Dr. Ninfa Linden, orthopedic surgeon on call states the patient can eat, he will probably not be able to do her surgery today due to her INR.  03:12 AM Dr Ara Kussmaul, hospitalist, will admit.   After reviewing her urinalysis she was given Rocephin IV and urine culture was sent.  Final Clinical Impressions(s) / ED Diagnoses   Final diagnoses:  Closed fracture of right hip, initial encounter ([place])  Warfarin-induced coagulopathy ([place])  Acute cystitis without hematuria    Plan admission  Rolland Porter, MD, Barbette Or, MD 03/22/18 [phone]

## 2018-03-23 ENCOUNTER — Encounter (HOSPITAL_COMMUNITY): Payer: Self-pay

## 2018-03-23 ENCOUNTER — Inpatient Hospital Stay (HOSPITAL_COMMUNITY): Payer: Medicare Other | Admitting: Anesthesiology

## 2018-03-23 ENCOUNTER — Inpatient Hospital Stay (HOSPITAL_COMMUNITY): Payer: Medicare Other

## 2018-03-23 ENCOUNTER — Encounter (HOSPITAL_COMMUNITY)
Admission: EM | Disposition: A | Discharge status: Skilled Nursing Facility | Payer: Self-pay | Source: Home / Self Care | Attending: Internal Medicine

## 2018-03-23 ENCOUNTER — Encounter: Payer: Self-pay | Admitting: Physician Assistant

## 2018-03-23 DIAGNOSIS — I480 Paroxysmal atrial fibrillation: Secondary | ICD-10-CM

## 2018-03-23 DIAGNOSIS — S72001A Fracture of unspecified part of neck of right femur, initial encounter for closed fracture: Secondary | ICD-10-CM

## 2018-03-23 HISTORY — PX: INTRAMEDULLARY (IM) NAIL INTERTROCHANTERIC: SHX5875

## 2018-03-23 LAB — COMPREHENSIVE METABOLIC PANEL
ALBUMIN: 2.7 g/dL — AB (ref 3.5–5.0)
ALT: 20 U/L (ref 14–54)
ANION GAP: 6 (ref 5–15)
AST: 21 U/L (ref 15–41)
Alkaline Phosphatase: 48 U/L (ref 38–126)
BILIRUBIN TOTAL: 0.5 mg/dL (ref 0.3–1.2)
BUN: 40 mg/dL — ABNORMAL HIGH (ref 6–20)
CALCIUM: 8.2 mg/dL — AB (ref 8.9–10.3)
CO2: 25 mmol/L (ref 22–32)
Chloride: 112 mmol/L — ABNORMAL HIGH (ref 101–111)
Creatinine, Ser: 1.46 mg/dL — ABNORMAL HIGH (ref 0.44–1.00)
GFR calc non Af Amer: 30 mL/min — ABNORMAL LOW (ref >60)
GFR, EST AFRICAN AMERICAN: 35 mL/min — AB (ref >60)
GLUCOSE: 194 mg/dL — AB (ref 65–99)
POTASSIUM: 4.9 mmol/L (ref 3.5–5.1)
SODIUM: 143 mmol/L (ref 135–145)
TOTAL PROTEIN: 5.4 g/dL — AB (ref 6.5–8.1)

## 2018-03-23 LAB — CBC WITH DIFFERENTIAL/PLATELET
BASOS PCT: 0 %
Basophils Absolute: 0 10*3/uL (ref 0.0–0.1)
EOS ABS: 0.3 10*3/uL (ref 0.0–0.7)
Eosinophils Relative: 3 %
HCT: 28.9 % — ABNORMAL LOW (ref 36.0–46.0)
Hemoglobin: 9.5 g/dL — ABNORMAL LOW (ref 12.0–15.0)
LYMPHS ABS: 1.3 10*3/uL (ref 0.7–4.0)
Lymphocytes Relative: 14 %
MCH: 29.2 pg (ref 26.0–34.0)
MCHC: 32.9 g/dL (ref 30.0–36.0)
MCV: 88.9 fL (ref 78.0–100.0)
MONO ABS: 0.6 10*3/uL (ref 0.1–1.0)
MONOS PCT: 6 %
Neutro Abs: 6.9 10*3/uL (ref 1.7–7.7)
Neutrophils Relative %: 77 %
Platelets: 145 10*3/uL — ABNORMAL LOW (ref 150–400)
RBC: 3.25 MIL/uL — ABNORMAL LOW (ref 3.87–5.11)
RDW: 15.8 % — AB (ref 11.5–15.5)
WBC: 9 10*3/uL (ref 4.0–10.5)

## 2018-03-23 LAB — BPAM FFP
Blood Product Expiration Date: 201905172359
ISSUE DATE / TIME: 201905121624
UNIT TYPE AND RH: 5100

## 2018-03-23 LAB — PREPARE FRESH FROZEN PLASMA: Unit division: 0

## 2018-03-23 LAB — GLUCOSE, CAPILLARY
Glucose-Capillary: 172 mg/dL — ABNORMAL HIGH (ref 65–99)
Glucose-Capillary: 173 mg/dL — ABNORMAL HIGH (ref 65–99)
Glucose-Capillary: 164 mg/dL — ABNORMAL HIGH (ref 65–99)
Glucose-Capillary: 183 mg/dL — ABNORMAL HIGH (ref 65–99)

## 2018-03-23 LAB — IRON AND TIBC
Iron: 27 ug/dL — ABNORMAL LOW (ref 28–170)
Saturation Ratios: 11 % (ref 10.4–31.8)
TIBC: 251 ug/dL (ref 250–450)
UIBC: 224 ug/dL

## 2018-03-23 LAB — VITAMIN B12: Vitamin B-12: 402 pg/mL (ref 180–914)

## 2018-03-23 LAB — FOLATE: FOLATE: 28.6 ng/mL (ref >5.9)

## 2018-03-23 LAB — PROTIME-INR
INR: 1.23
PROTHROMBIN TIME: 15.4 s — AB (ref 11.4–15.2)

## 2018-03-23 LAB — RETICULOCYTES
RBC.: 3.05 MIL/uL — AB (ref 3.87–5.11)
RETIC COUNT ABSOLUTE: 54.9 10*3/uL (ref 19.0–186.0)
RETIC CT PCT: 1.8 % (ref 0.4–3.1)

## 2018-03-23 LAB — MRSA PCR SCREENING: MRSA by PCR: POSITIVE — AB

## 2018-03-23 LAB — FERRITIN: Ferritin: 70 ng/mL (ref 11–307)

## 2018-03-23 SURGERY — FIXATION, FRACTURE, INTERTROCHANTERIC, WITH INTRAMEDULLARY ROD
Anesthesia: General | Site: Leg Upper | Laterality: Right

## 2018-03-23 MED ORDER — LIDOCAINE HCL (CARDIAC) PF 100 MG/5ML IV SOSY
PREFILLED_SYRINGE | INTRAVENOUS | Status: DC | PRN
  Administered 2018-03-23: 60 mg via INTRAVENOUS

## 2018-03-23 MED ORDER — METOCLOPRAMIDE HCL 5 MG/ML IJ SOLN: 5.0000 mg | Freq: Three times a day (TID) | INTRAMUSCULAR | Status: DC | PRN

## 2018-03-23 MED ORDER — SUGAMMADEX SODIUM 200 MG/2ML IV SOLN
INTRAVENOUS | Status: DC | PRN
  Administered 2018-03-23: 150 mg via INTRAVENOUS

## 2018-03-23 MED ORDER — 0.9 % SODIUM CHLORIDE (POUR BTL) OPTIME
TOPICAL | Status: DC | PRN
  Administered 2018-03-23: 1000 mL

## 2018-03-23 MED ORDER — HYDROCODONE-ACETAMINOPHEN 7.5-325 MG PO TABS
1.0000 | ORAL_TABLET | ORAL | Status: DC | PRN
  Administered 2018-03-26 – 2018-03-27 (×2): 1 via ORAL
  Filled 2018-03-23 (×2): qty 1

## 2018-03-23 MED ORDER — WARFARIN - PHARMACIST DOSING INPATIENT
Freq: Every day | Status: DC
  Administered 2018-03-24: 18:00:00

## 2018-03-23 MED ORDER — PROPOFOL 10 MG/ML IV BOLUS
INTRAVENOUS | Status: DC | PRN
  Administered 2018-03-23: 50 mg via INTRAVENOUS

## 2018-03-23 MED ORDER — LIDOCAINE 2% (20 MG/ML) 5 ML SYRINGE
INTRAMUSCULAR | Status: AC
Start: 2018-03-23 — End: ?
  Filled 2018-03-23: qty 5

## 2018-03-23 MED ORDER — PROPOFOL 10 MG/ML IV BOLUS
INTRAVENOUS | Status: AC
  Filled 2018-03-23: qty 20

## 2018-03-23 MED ORDER — FENTANYL CITRATE (PF) 100 MCG/2ML IJ SOLN
INTRAMUSCULAR | Status: AC
  Filled 2018-03-23: qty 2

## 2018-03-23 MED ORDER — PHENYLEPHRINE HCL 10 MG/ML IJ SOLN
INTRAMUSCULAR | Status: DC | PRN
  Administered 2018-03-23 (×2): 120 ug via INTRAVENOUS
  Administered 2018-03-23: 160 ug via INTRAVENOUS
  Administered 2018-03-23: 80 ug via INTRAVENOUS

## 2018-03-23 MED ORDER — DOCUSATE SODIUM 100 MG PO CAPS: 100.0000 mg | ORAL_CAPSULE | Freq: Two times a day (BID) | ORAL | Status: DC

## 2018-03-23 MED ORDER — ROCURONIUM BROMIDE 100 MG/10ML IV SOLN
INTRAVENOUS | Status: DC | PRN
  Administered 2018-03-23: 40 mg via INTRAVENOUS

## 2018-03-23 MED ORDER — MUPIROCIN 2 % EX OINT
TOPICAL_OINTMENT | Freq: Two times a day (BID) | CUTANEOUS | Status: DC
  Administered 2018-03-23 – 2018-03-26 (×6): via NASAL
  Administered 2018-03-26: 1 via NASAL
  Administered 2018-03-27: 10:00:00 via NASAL
  Filled 2018-03-23 (×4): qty 22

## 2018-03-23 MED ORDER — PHENYLEPHRINE HCL 10 MG/ML IJ SOLN
INTRAVENOUS | Status: DC | PRN
  Administered 2018-03-23: 50 ug/min via INTRAVENOUS

## 2018-03-23 MED ORDER — MENTHOL 3 MG MT LOZG: 1.0000 | LOZENGE | OROMUCOSAL | Status: DC | PRN

## 2018-03-23 MED ORDER — WARFARIN SODIUM 4 MG PO TABS: 4.0000 mg | ORAL_TABLET | Freq: Once | ORAL | Status: DC

## 2018-03-23 MED ORDER — HYDROCODONE-ACETAMINOPHEN 5-325 MG PO TABS
1.0000 | ORAL_TABLET | ORAL | Status: DC | PRN
  Administered 2018-03-25: 2 via ORAL
  Administered 2018-03-25: 1 via ORAL
  Administered 2018-03-26 (×2): 2 via ORAL
  Filled 2018-03-23: qty 2
  Filled 2018-03-23: qty 1
  Filled 2018-03-23 (×2): qty 2

## 2018-03-23 MED ORDER — MORPHINE SULFATE (PF) 2 MG/ML IV SOLN: 0.5000 mg | INTRAVENOUS | Status: DC | PRN

## 2018-03-23 MED ORDER — DEXAMETHASONE SODIUM PHOSPHATE 10 MG/ML IJ SOLN
INTRAMUSCULAR | Status: AC
  Filled 2018-03-23: qty 1

## 2018-03-23 MED ORDER — CEFAZOLIN SODIUM-DEXTROSE 2-3 GM-%(50ML) IV SOLR
INTRAVENOUS | Status: DC | PRN
  Administered 2018-03-23: 2 g via INTRAVENOUS

## 2018-03-23 MED ORDER — FENTANYL CITRATE (PF) 250 MCG/5ML IJ SOLN
INTRAMUSCULAR | Status: AC
Start: 2018-03-23 — End: ?
  Filled 2018-03-23: qty 5

## 2018-03-23 MED ORDER — METOCLOPRAMIDE HCL 5 MG PO TABS: 5.0000 mg | ORAL_TABLET | Freq: Three times a day (TID) | ORAL | Status: DC | PRN

## 2018-03-23 MED ORDER — DEXAMETHASONE SODIUM PHOSPHATE 10 MG/ML IJ SOLN
INTRAMUSCULAR | Status: DC | PRN
  Administered 2018-03-23: 10 mg via INTRAVENOUS

## 2018-03-23 MED ORDER — FENTANYL CITRATE (PF) 100 MCG/2ML IJ SOLN
25.0000 ug | INTRAMUSCULAR | Status: DC | PRN
  Administered 2018-03-23: 25 ug via INTRAVENOUS

## 2018-03-23 MED ORDER — ONDANSETRON HCL 4 MG/2ML IJ SOLN: 4.0000 mg | Freq: Four times a day (QID) | INTRAMUSCULAR | Status: DC | PRN

## 2018-03-23 MED ORDER — LACTATED RINGERS IV SOLN
INTRAVENOUS | Status: DC
  Administered 2018-03-23: 18:00:00 via INTRAVENOUS

## 2018-03-23 MED ORDER — ONDANSETRON HCL 4 MG PO TABS
4.0000 mg | ORAL_TABLET | Freq: Four times a day (QID) | ORAL | Status: DC | PRN
  Administered 2018-03-26: 4 mg via ORAL
  Filled 2018-03-23: qty 1

## 2018-03-23 MED ORDER — SODIUM CHLORIDE 0.9 % IV BOLUS
500.0000 mL | Freq: Once | INTRAVENOUS | Status: AC
  Administered 2018-03-23: 500 mL via INTRAVENOUS

## 2018-03-23 MED ORDER — ALBUMIN HUMAN 5 % IV SOLN
INTRAVENOUS | Status: DC | PRN
  Administered 2018-03-23 (×2): via INTRAVENOUS

## 2018-03-23 MED ORDER — CEFAZOLIN SODIUM-DEXTROSE 2-4 GM/100ML-% IV SOLN
INTRAVENOUS | Status: AC
  Filled 2018-03-23: qty 100

## 2018-03-23 MED ORDER — ONDANSETRON HCL 4 MG/2ML IJ SOLN
INTRAMUSCULAR | Status: AC
Start: 2018-03-23 — End: ?
  Filled 2018-03-23: qty 2

## 2018-03-23 MED ORDER — SALINE SPRAY 0.65 % NA SOLN
1.0000 | NASAL | Status: DC | PRN
  Filled 2018-03-23: qty 44

## 2018-03-23 MED ORDER — ACETAMINOPHEN 325 MG PO TABS
325.0000 mg | ORAL_TABLET | Freq: Four times a day (QID) | ORAL | Status: DC | PRN
  Administered 2018-03-26: 325 mg via ORAL
  Filled 2018-03-23: qty 1

## 2018-03-23 MED ORDER — PHENYLEPHRINE 40 MCG/ML (10ML) SYRINGE FOR IV PUSH (FOR BLOOD PRESSURE SUPPORT)
PREFILLED_SYRINGE | INTRAVENOUS | Status: AC
  Filled 2018-03-23: qty 20

## 2018-03-23 MED ORDER — FENTANYL CITRATE (PF) 100 MCG/2ML IJ SOLN
INTRAMUSCULAR | Status: DC | PRN
  Administered 2018-03-23: 50 ug via INTRAVENOUS

## 2018-03-23 MED ORDER — PHENOL 1.4 % MT LIQD: 1.0000 | OROMUCOSAL | Status: DC | PRN

## 2018-03-23 MED ORDER — SUCCINYLCHOLINE CHLORIDE 200 MG/10ML IV SOSY
PREFILLED_SYRINGE | INTRAVENOUS | Status: AC
  Filled 2018-03-23: qty 10

## 2018-03-23 MED ORDER — SUGAMMADEX SODIUM 200 MG/2ML IV SOLN
INTRAVENOUS | Status: AC
  Filled 2018-03-23: qty 2

## 2018-03-23 MED ORDER — SODIUM CHLORIDE 0.9 % IV SOLN
INTRAVENOUS | Status: DC
  Administered 2018-03-23 – 2018-03-24 (×3): via INTRAVENOUS

## 2018-03-23 MED ORDER — CEFAZOLIN SODIUM-DEXTROSE 2-4 GM/100ML-% IV SOLN
2.0000 g | Freq: Four times a day (QID) | INTRAVENOUS | Status: AC
  Administered 2018-03-24 (×2): 2 g via INTRAVENOUS
  Filled 2018-03-23 (×2): qty 100

## 2018-03-23 SURGICAL SUPPLY — 45 items
BIT DRILL AO GAMMA 4.2X180 (BIT) ×3 IMPLANT
COVER PERINEAL POST (MISCELLANEOUS) ×3 IMPLANT
COVER SURGICAL LIGHT HANDLE (MISCELLANEOUS) ×3 IMPLANT
DRAPE C-ARMOR (DRAPES) ×3 IMPLANT
DRAPE STERI IOBAN 125X83 (DRAPES) ×3 IMPLANT
DRESSING AQUACEL AG SP 3.5X10 (GAUZE/BANDAGES/DRESSINGS) ×1 IMPLANT
DRSG AQUACEL AG SP 3.5X10 (GAUZE/BANDAGES/DRESSINGS) ×3
DRSG MEPILEX BORDER 4X4 (GAUZE/BANDAGES/DRESSINGS) ×3 IMPLANT
DRSG PAD ABDOMINAL 8X10 ST (GAUZE/BANDAGES/DRESSINGS) ×6 IMPLANT
DURAPREP 26ML APPLICATOR (WOUND CARE) ×3 IMPLANT
ELECT REM PT RETURN 9FT ADLT (ELECTROSURGICAL) ×3
ELECTRODE REM PT RTRN 9FT ADLT (ELECTROSURGICAL) ×1 IMPLANT
FACESHIELD WRAPAROUND (MASK) ×3 IMPLANT
GLOVE BIO SURGEON STRL SZ8 (GLOVE) ×3 IMPLANT
GLOVE BIOGEL PI IND STRL 8 (GLOVE) ×2 IMPLANT
GLOVE BIOGEL PI INDICATOR 8 (GLOVE) ×4
GLOVE ORTHO TXT STRL SZ7.5 (GLOVE) ×3 IMPLANT
GOWN STRL REUS W/ TWL LRG LVL3 (GOWN DISPOSABLE) ×1 IMPLANT
GOWN STRL REUS W/ TWL XL LVL3 (GOWN DISPOSABLE) ×3 IMPLANT
GOWN STRL REUS W/TWL LRG LVL3 (GOWN DISPOSABLE) ×2
GOWN STRL REUS W/TWL XL LVL3 (GOWN DISPOSABLE) ×6
K-WIRE  3.2X450M STR (WIRE) ×2
K-WIRE 3.2X450M STR (WIRE) ×1
KIT BASIN OR (CUSTOM PROCEDURE TRAY) ×3 IMPLANT
KIT TURNOVER KIT B (KITS) ×3 IMPLANT
KWIRE 3.2X450M STR (WIRE) ×1 IMPLANT
LINER BOOT UNIVERSAL DISP (MISCELLANEOUS) ×3 IMPLANT
MANIFOLD NEPTUNE II (INSTRUMENTS) ×3 IMPLANT
NAIL LONG HIP 011X360MMX125D (Nail) ×3 IMPLANT
NS IRRIG 1000ML POUR BTL (IV SOLUTION) ×3 IMPLANT
PACK GENERAL/GYN (CUSTOM PROCEDURE TRAY) ×3 IMPLANT
PAD ARMBOARD 7.5X6 YLW CONV (MISCELLANEOUS) ×9 IMPLANT
PAD CAST 4YDX4 CTTN HI CHSV (CAST SUPPLIES) ×2 IMPLANT
PADDING CAST COTTON 4X4 STRL (CAST SUPPLIES) ×4
SCREW LAG GAMMA 3 95MM (Screw) ×3 IMPLANT
SCREW LOCKING T2 F/T  5X42.5MM (Screw) ×2 IMPLANT
SCREW LOCKING T2 F/T 5X42.5MM (Screw) ×1 IMPLANT
STAPLER VISISTAT 35W (STAPLE) ×3 IMPLANT
SUT VIC AB 0 CT1 27 (SUTURE) ×4
SUT VIC AB 0 CT1 27XBRD ANBCTR (SUTURE) ×2 IMPLANT
SUT VIC AB 2-0 CT1 27 (SUTURE) ×4
SUT VIC AB 2-0 CT1 TAPERPNT 27 (SUTURE) ×2 IMPLANT
TOWEL OR 17X24 6PK STRL BLUE (TOWEL DISPOSABLE) ×3 IMPLANT
TOWEL OR 17X26 10 PK STRL BLUE (TOWEL DISPOSABLE) ×3 IMPLANT
WATER STERILE IRR 1000ML POUR (IV SOLUTION) ×3 IMPLANT

## 2018-03-23 NOTE — Significant Event (Addendum)
Rapid Response Event Note  Overview:  Neurologic  Initial Focused Assessment: Per RN, patient is more lethargic and is experiencing hallucinations (has a history of this).  Patient has been hypotensive, received fluids, blood, and coumadin reversal. Upon assessment, patient arose to voice, able to follow commands in all extremities, able to give appropriate answers to most questions. Skin warm and dry, lung sounds clear. Patient sustained a fall, has a broken hip and right scalp hematoma.   Interventions: -- None--  Plan of Care: -- [facility] NP notified and updated. RN to monitor mental status and notify primary service if any changes occur   Event Summary:   at    Call Time [place] End Time [place], [address]

## 2018-03-23 NOTE — Brief Op Note (Signed)
03/21/2018 - 03/23/2018  7:20 PM  PATIENT:  Kylie Sanchez  84 y.o. female  PRE-OPERATIVE DIAGNOSIS:  RIGHT HIP FRACTURE  POST-OPERATIVE DIAGNOSIS:  RIGHT HIP FRACTURE  PROCEDURE:  Procedure(s): INTRAMEDULLARY (IM) NAIL RIGHT INTERTROCHANTRIC (Right)  SURGEON:  Surgeon(s) and Role:    * Mcarthur Rossetti, MD - Primary  PHYSICIAN ASSISTANT: Benita Stabile, PA-C  ANESTHESIA:   general  EBL:  100 mL   DICTATION: .Other Dictation: Dictation Number [id number]  PLAN OF CARE: Admit to inpatient   PATIENT DISPOSITION:  PACU - hemodynamically stable.   Delay start of Pharmacological VTE agent (>24hrs) due to surgical blood loss or risk of bleeding: no

## 2018-03-23 NOTE — Progress Notes (Signed)
0345 pt is having hallucinations (per pt she has had them in the past). Pt is aware is the having the hallucinations and is her neuro status is the same as at the start of shift.  0404 Pt HR has converted in to A-fib MD paged. Pt has history of a-fib.  0415 pt is having new onset confusion and is more lethargic than previous assessment. Rapid responds is paged and responds to bedside. MD returns page. No orders given at this time will continue to monitor pt.

## 2018-03-23 NOTE — Progress Notes (Signed)
Patient ID: Kylie Sanchez, female   DOB: 26-Jan-1925, 84 y.o.   MRN: [id number] The patient is currently in stepdown.  Her INR has normalized and her hgb is stable.  BP has improved at the bedside right now.  Will place her on the OR schedule for at the end of the day today unless she becomes more unstable medically.  Will have her NPO after 10:00 am.  Please call if we need to hold off on surgery for this evening.  I will also communicate with the family.

## 2018-03-23 NOTE — Progress Notes (Signed)
Pt c/o of muscle spasms in right leg. MD made aware and gave orders. Orders followed. Will continue to monitor pt.

## 2018-03-23 NOTE — Anesthesia Procedure Notes (Signed)

## 2018-03-23 NOTE — Progress Notes (Signed)
Progress Note  Patient Name: Kylie Sanchez Date of Encounter: 03/23/2018  Primary Cardiologist: Thompson Grayer, MD   Subjective   Hypotensive at times overnight, blood transfusion administered.  Rapid response reviewed.  No shortness of breath.  Confusion  Inpatient Medications    Scheduled Meds: . atorvastatin  40 mg Oral q1800  . B-complex with vitamin C  1 tablet Oral Daily  . docusate sodium  100 mg Oral BID  . insulin aspart  0-15 Units Subcutaneous TID WC   Continuous Infusions: . sodium chloride Stopped (03/22/18 1521)  . sodium chloride 75 mL/hr at 03/23/18 0325  . cefTRIAXone (ROCEPHIN)  IV Stopped (03/23/18 0528)   PRN Meds: cyclobenzaprine, HYDROcodone-acetaminophen, morphine injection, nitroGLYCERIN, sodium chloride   Vital Signs    Vitals:   03/23/18 0600 03/23/18 0630 03/23/18 0635 03/23/18 0700  BP: (!) 89/52 (!) 72/50 (!) 99/56 (!) 134/57  Pulse: 98 (!) 120 90 98  Resp: 17 (!) 21 12 (!) 21  Temp:    98.1 F (36.7 C)  TempSrc:    Oral  SpO2: 97% 99% 98% 92%  Weight:      Height:        Intake/Output Summary (Last 24 hours) at 03/23/2018 0817 Last data filed at 03/23/2018 0800 Gross per 24 hour  Intake 826.15 ml  Output 925 ml  Net -98.85 ml   Filed Weights   03/21/18 2350  Weight: 140 lb (63.5 kg)    Telemetry    Brief periods of atrial fibrillation overnight.  Currently sinus rhythm- Personally Reviewed  ECG    Sinus with ventricular pacing- Personally Reviewed  Physical Exam   GEN: No acute distress.  Elderly, confused, missing left eye Neck: No JVD Cardiac: RRR, no murmurs, rubs, or gallops.  Respiratory: Clear to auscultation bilaterally. GI: Soft, nontender, non-distended  MS: No edema; No deformity. Neuro:  Nonfocal  Psych: Confused  Labs    Chemistry Recent Labs  Lab 03/22/18 0007 03/22/18 0430 03/22/18 2119 03/23/18 0229  NA 139 140 143 143  K 4.8 5.2* 5.0 4.9  CL 104 109 111 112*  CO2 24 21* 25 25  GLUCOSE 234*  231* 174* 194*  BUN 45* 44* 40* 40*  CREATININE 1.37* 1.49* 1.51* 1.46*  CALCIUM 9.0 8.5* 8.4* 8.2*  PROT 6.3*  --   --  5.4*  ALBUMIN 3.2*  --   --  2.7*  AST 25  --   --  21  ALT 26  --   --  20  ALKPHOS 76  --   --  48  BILITOT 0.4  --   --  0.5  GFRNONAA 33* 30* 29* 30*  GFRAA 38* 34* 34* 35*  ANIONGAP 11 10 7 6      Hematology Recent Labs  Lab 03/22/18 1335 03/22/18 2119 03/23/18 0229  WBC 12.1* 10.5 9.0  RBC 2.65* 3.55* 3.25*  HGB 7.9* 10.5* 9.5*  HCT 24.9* 31.7* 28.9*  MCV 94.0 89.3 88.9  MCH 29.8 29.6 29.2  MCHC 31.7 33.1 32.9  RDW 13.7 15.9* 15.8*  PLT 239 157 145*    Cardiac Enzymes Recent Labs  Lab 03/22/18 2119  TROPONINI 0.05*   No results for input(s): TROPIPOC in the last 168 hours.   BNPNo results for input(s): BNP, PROBNP in the last 168 hours.   DDimer No results for input(s): DDIMER in the last 168 hours.   Radiology    Dg Chest 1 View  Result Date: 03/22/2018 CLINICAL DATA:  Closed  fracture of the right hip.  Preop. EXAM: CHEST  1 VIEW COMPARISON:  05/18/2017 FINDINGS: Heart is top-normal in size. There is aortic atherosclerosis. No definite aneurysm. Left-sided pacemaker apparatus right atrial and right ventricular leads are redemonstrated. There is scarring overlying the left costophrenic angle and to a lesser degree the right costophrenic angle. Clearing of right-sided pleural effusions since previous exam. No overt pulmonary edema or pulmonary consolidation. Intact bony thorax. 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 IMPRESSION: No pneumonia.  Bibasilar scarring.  Aortic atherosclerosis. Electronically Signed   By: Ashley Royalty M.D.   On: 03/22/2018 01:42   Ct Head Wo Contrast  Result Date: 03/22/2018 CLINICAL DATA:  Status post fall last night. EXAM: CT HEAD WITHOUT CONTRAST TECHNIQUE: Contiguous axial images were obtained from the base of the skull through the vertex without intravenous contrast. COMPARISON:  May 13, 2017 FINDINGS:  Brain: No evidence of acute infarction, hemorrhage, hydrocephalus, extra-axial collection or mass lesion/mass effect. There is chronic diffuse atrophy. Chronic bilateral periventricular white matter small vessel ischemic changes noted. Small old right basal ganglia lacunar infarction is identified unchanged. Vascular: No hyperdense vessel or unexpected calcification. 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 Sinuses/Orbits: No acute abnormality. 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 Other: Right parietal scalp swelling and hematoma is noted. IMPRESSION: No focal acute intracranial abnormality identified. Right parietal scalp swelling hematoma is identified. Electronically Signed   By: Abelardo Diesel M.D.   On: 03/22/2018 07:53   Dg Hip Unilat With Pelvis 2-3 Views Left  Result Date: 03/22/2018 CLINICAL DATA:  84 year old female with fall. EXAM: DG HIP (WITH OR WITHOUT PELVIS) 2-3V LEFT; DG HIP (WITH OR WITHOUT PELVIS) 2-3V RIGHT COMPARISON:  None. FINDINGS: There is a comminuted and mildly displaced intertrochanteric fracture of the right femur with mild proximal migration of the femoral diaphysis. There is no fracture or dislocation of the left hip. Sclerotic area involving the right inferior pubic ramus, age indeterminate, possibly chronic. Clinical correlation is recommended. There is advanced osteopenia. There is degenerative changes of the lower lumbar spine. The soft tissues are grossly unremarkable. IMPRESSION: Mildly displaced intertrochanteric fracture of the right femur. No dislocation. Electronically Signed   By: Anner Crete M.D.   On: 03/22/2018 01:39   Dg Hip Unilat  With Pelvis 2-3 Views Right  Result Date: 03/22/2018 CLINICAL DATA:  84 year old female with fall. EXAM: DG HIP (WITH OR WITHOUT PELVIS) 2-3V LEFT; DG HIP (WITH OR WITHOUT PELVIS) 2-3V RIGHT COMPARISON:  None. FINDINGS: There is a comminuted and  mildly displaced intertrochanteric fracture of the right femur with mild proximal migration of the femoral diaphysis. There is no fracture or dislocation of the left hip. Sclerotic area involving the right inferior pubic ramus, age indeterminate, possibly chronic. Clinical correlation is recommended. There is advanced osteopenia. There is degenerative changes of the lower lumbar spine. The soft tissues are grossly unremarkable. IMPRESSION: Mildly displaced intertrochanteric fracture of the right femur. No dislocation. Electronically Signed   By: Anner Crete M.D.   On: 03/22/2018 01:39    Cardiac Studies   Echo-65%  Patient Profile     84 y.o. female on Coumadin for paroxysmal atrial fibrillation with pacemaker, normalized ejection fraction after tachycardia mediated cardiomyopathy with diabetes hypertension hyperlipidemia, carotid artery stenosis CEA in 2017, who sustained a fall awaiting surgery  Assessment & Plan    Preoperative for stratification - No new changes.  Reviewed Dr. Elmarie Shiley previous note.  Agree.  Continue with supportive care.  Required transfusion for blood loss anemia.  Continue to watch oxygen saturations for any signs of volume overload.  Most recent EF however normal.  Paroxysmal atrial fibrillation - Postoperatively, one may consider reinitiating Coumadin without bridge to minimize bleeding risk. -Brief periods of atrial fibrillation overnight. -When blood pressure allows reinitiate Toprol, she was taking 100 mg 2 times a day at home according to home med list.  May wish to reinitiate at lower dose.  Carotid artery disease -Stable.  Pacemaker -Stable  Prior tachycardia mediated cardiomyopathy -Last echocardiogram improved from 35-65.  Excellent.  Please let us know we can be of further assistance.  We will sign off.  For questions or updates, please contact [place] Please consult [email or web address] for contact info under Cardiology/STEMI.       Signed, Candee Furbish, MD  03/23/2018, 8:17 AM

## 2018-03-23 NOTE — Transfer of Care (Signed)
Immediate Anesthesia Transfer of Care Note  Patient: Kylie Sanchez  Procedure(s) Performed: INTRAMEDULLARY (IM) NAIL RIGHT INTERTROCHANTRIC (Right Leg Upper)  Patient Location: PACU  Anesthesia Type:General  Level of Consciousness: awake, drowsy and patient cooperative  Airway & Oxygen Therapy: Patient Spontanous Breathing and Patient connected to face mask oxygen  Post-op Assessment: Report given to RN and Post -op Vital signs reviewed and stable  Post vital signs: Reviewed and stable  Last Vitals:  Vitals Value Taken Time  BP 148/100 03/23/2018  7:31 PM  Temp    Pulse 105 03/23/2018  7:35 PM  Resp 18 03/23/2018  7:35 PM  SpO2 100 % 03/23/2018  7:35 PM  Vitals shown include unvalidated device data.  Last Pain:  Vitals:   03/23/18 1125  TempSrc: Oral  PainSc:       Patients Stated Pain Goal: 3 (84/83/50 7573)  Complications: No apparent anesthesia complications

## 2018-03-23 NOTE — Progress Notes (Signed)
ANTICOAGULATION CONSULT NOTE - Follow Up Consult  Pharmacy Consult for Warfarin Indication: atrial fibrillation  Allergies  Allergen Reactions  . No Known Allergies     Patient Measurements: Height: 5\' 2"  (157.5 cm) Weight: (Unable to get weight due to bed scale broken ) IBW/kg (Calculated) : 50.1  Vital Signs: Temp: 97.9 F (36.6 C) (05/13 1125) Temp Source: Oral (05/13 1125) BP: 138/61 (05/13 1616) Pulse Rate: 105 (05/13 1616)  Labs: Recent Labs    03/22/18 0007 03/22/18 0430  03/22/18 1335 03/22/18 2119 03/23/18 0229  HGB 11.0* 9.3*   < > 7.9* 10.5* 9.5*  HCT 33.8* 29.3*   < > 24.9* 31.7* 28.9*  PLT 278 255  --  239 157 145*  APTT 32  --   --   --   --   --   LABPROT 30.3* 32.3*  --  18.6*  --  15.4*  INR 2.92 3.17  --  1.57  --  1.23  CREATININE 1.37* 1.49*  --   --  1.51* 1.46*  TROPONINI  --   --   --   --  0.05*  --    < > = values in this interval not displayed.    Estimated Creatinine Clearance: 22.4 mL/min (A) (by C-G formula based on SCr of 1.46 mg/dL (H)).   Assessment: 84 year old female resuming warfarin for PAF following surgery for a hip fracture Dose prior to admission = 4 mg MWF, 2 mg other days INR today = 1.23  Goal of Therapy:  INR 2-3 Monitor platelets by anticoagulation protocol: Yes   Plan:  Warfarin 4 mg po x 1 dose tonight Daily INR  Thank you Anette Guarneri, PharmD [phone]  03/23/2018,7:28 PM

## 2018-03-23 NOTE — Progress Notes (Addendum)
0230 Pt Bp continued to remain soft in the 80's-90'sSPB and Map in the 50' and low 60's MD Made aware and gave orders. Orders followed. Will continue to monitor pt.

## 2018-03-23 NOTE — Progress Notes (Signed)
PROGRESS NOTE    Kylie Sanchez  GEX:[id number] DOB: 05/14/1925 DOA: 03/21/2018 PCP: Unk Pinto, MD Brief Narrative:84 y.o. female with a known history of CKD, HTN, HLD, Afib on Coumadin, DM presents to the emergency department for evaluation of right hip pain s/p mechanical fall.  Patient was in a usual state of health until this evening when she got was not using her walker and fell landing on hardwood floors.Complains of right hip pain.  Lives with her younger brother.   Patient denies fevers/chills, weakness, dizziness, chest pain, shortness of breath, N/V/C/D, abdominal pain, dysuria/frequency, changes in mental status.    Otherwise there has been no change in status. Patient has been taking medication as prescribed and there has been no recent change in medication or diet.    EMS/ED Course: Patient received Rocephin, mophine, NS. Medical admission has been requested for further management of right intertroch hip fracture 03/23/2018-patient was hypotensive and became progressively anemic with a drop of 3 g hemoglobin since admission and within 24 hours.  Patient received 3-1/2 L of bolus IV normal saline, blood pressure did not improve with this.  She received 2 units of packed RBCs wide open.  Her blood pressure improved after getting blood transfusion.  She also received FFP x1.  She was transferred to the stepdown unit.  Cardiology consult and PCCM consult was done.  Patient was on Coumadin for atrial fibrillation with a supratherapeutic INR given vitamin K and FFP and her INR is 1.23   so she can have surgery 03/23/2018.   Assessment & Plan:   Active Problems:   Closed right hip fracture, initial encounter [facility])   Hip fracture ([place]) 1] right intertrochanteric hip fracture status post mechanical fall Ortho to take her for surgery 03/23/2018 as long as she remains stable.  At this time patient has been stable blood pressure normalized hemoglobin improved INR  corrected so she can have  surgery.  Consider restarting Coumadin without bridge to minimize bleeding when okay with Ortho postop.  2] hypotension has significantly improved after 2 units of blood transfusion.  Patient has a history of hypertension and she was taking metoprolol at home which has been on hold at this time restart postop as tolerated.  3] acute anemia drop in hemoglobin 3 g in 24 hours if bleeding into the fracture site?  No other evidence of bleeding noted head CT shows no acute changes.  Patient received 2 units of blood transfusion and holding of the hemoglobin.  To follow-up levels tomorrow.  FOBT ordered yesterday none resulted today.  Iron panel pending.  4] history of type 2 diabetes patient takes metformin and Amaryl at home which is been on hold continue sliding-scale coverage.  5] history of atrial fibrillation on Coumadin and metoprolol as an outpatient both are being held.  Restart PRN.  6] UTI with more than 100,000 colonies of gram-negative rods sensitivity pending.  Continue Rocephin for now follow-up final culture.  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  8] CKD stage III stable  9] history of CAD permanent pacemaker atrial fibrillation angioplasty normal ejection fraction-stable     DVT prophylaxis none at this time as patient is waiting to have surgery done today Code Status: Patient is DO NOT RESUSCITATE discussed with patient's niece who is a medical power of attorney.  Patient lives with her brother at home. Family Communication: Discussed with family  Disposition Plan: Patient came from home but after surgery patient  most likely will need rehab and skilled nursing facility placement. Consultants: Ortho, cardiology, PCCM cardiology and PCCM has signed off.  Procedures: None Antimicrobials Rocephin  Subjective: Patient is awake answer questions appropriately  Objective: Vitals:   03/23/18 0600 03/23/18 0630 03/23/18 0635 03/23/18 0700  BP:  (!) 89/52 (!) 72/50 (!) 99/56 (!) 134/57  Pulse: 98 (!) 120 90 98  Resp: 17 (!) 21 12 (!) 21  Temp:    98.1 F (36.7 C)  TempSrc:    Oral  SpO2: 97% 99% 98% 92%  Weight:      Height:        Intake/Output Summary (Last 24 hours) at 03/23/2018 0915 Last data filed at 03/23/2018 0800 Gross per 24 hour  Intake 826.15 ml  Output 925 ml  Net -98.85 ml   Filed Weights   03/21/18 2350  Weight: 63.5 kg (140 lb)    Examination:  General exam: Appears calm and comfortable  Respiratory system: Clear to auscultation. Respiratory effort normal. Cardiovascular system: S1 & S2 heard, RRR. No JVD, murmurs, rubs, gallops or clicks. No pedal edema. Gastrointestinal system: Abdomen is nondistended, soft and nontender. No organomegaly or masses felt. Normal bowel sounds heard. Central nervous system: Alert and oriented. No focal neurological deficits. Extremities:tender to right hip. Skin: No rashes, lesions or ulcers Psychiatry: Judgement and insight appear normal. Mood & affect appropriate.     Data Reviewed: I have personally reviewed following labs and imaging studies  CBC: Recent Labs  Lab 03/22/18 0007 03/22/18 0430 03/22/18 0955 03/22/18 1335 03/22/18 2119 03/23/18 0229  WBC 17.7* 16.8*  --  12.1* 10.5 9.0  NEUTROABS 14.6*  --   --   --   --  6.9  HGB 11.0* 9.3* 8.3* 7.9* 10.5* 9.5*  HCT 33.8* 29.3* 26.2* 24.9* 31.7* 28.9*  MCV 92.6 93.6  --  94.0 89.3 88.9  PLT 278 255  --  239 157 622*   Basic Metabolic Panel: Recent Labs  Lab 03/22/18 0007 03/22/18 0430 03/22/18 2119 03/23/18 0229  NA 139 140 143 143  K 4.8 5.2* 5.0 4.9  CL 104 109 111 112*  CO2 24 21* 25 25  GLUCOSE 234* 231* 174* 194*  BUN 45* 44* 40* 40*  CREATININE 1.37* 1.49* 1.51* 1.46*  CALCIUM 9.0 8.5* 8.4* 8.2*   GFR: Estimated Creatinine Clearance: 22.4 mL/min (A) (by C-G formula based on SCr of 1.46 mg/dL (H)). Liver Function Tests: Recent Labs  Lab 03/22/18 0007 03/23/18 0229  AST 25 21    ALT 26 20  ALKPHOS 76 48  BILITOT 0.4 0.5  PROT 6.3* 5.4*  ALBUMIN 3.2* 2.7*   No results for input(s): LIPASE, AMYLASE in the last 168 hours. No results for input(s): AMMONIA in the last 168 hours. Coagulation Profile: Recent Labs  Lab 03/18/18 1141 03/22/18 0007 03/22/18 0430 03/22/18 1335 03/23/18 0229  INR 2.0 2.92 3.17 1.57 1.23   Cardiac Enzymes: Recent Labs  Lab 03/22/18 2119  TROPONINI 0.05*   BNP (last 3 results) No results for input(s): PROBNP in the last 8760 hours. HbA1C: No results for input(s): HGBA1C in the last 72 hours. CBG: Recent Labs  Lab 03/22/18 1335 03/22/18 1754 03/22/18 2350 03/23/18 0426 03/23/18 0826  GLUCAP 140* 147* 178* 172* 173*   Lipid Profile: No results for input(s): CHOL, HDL, LDLCALC, TRIG, CHOLHDL, LDLDIRECT in the last 72 hours. Thyroid Function Tests: No results for input(s): TSH, T4TOTAL, FREET4, T3FREE, THYROIDAB in the last 72 hours. Anemia Panel: No results  for input(s): VITAMINB12, FOLATE, FERRITIN, TIBC, IRON, RETICCTPCT in the last 72 hours. Sepsis Labs: No results for input(s): PROCALCITON, LATICACIDVEN in the last 168 hours.  Recent Results (from the past 240 hour(s))  Urine culture     Status: Abnormal (Preliminary result)   Collection Time: 03/22/18  4:30 AM  Result Value Ref Range Status   Specimen Description   Final    URINE, CATHETERIZED Performed at [facility], [address] [place], [place] [address]    Special Requests NONE  Final   Culture >=100,000 COLONIES/mL GRAM NEGATIVE RODS (A)  Final   Report Status PENDING  Incomplete  MRSA PCR Screening     Status: Abnormal   Collection Time: 03/23/18  2:57 AM  Result Value Ref Range Status   MRSA by PCR POSITIVE (A) NEGATIVE Final    Comment:        The GeneXpert MRSA Assay (FDA approved for NASAL specimens only), is one component of a comprehensive MRSA colonization surveillance program. It is not intended to diagnose MRSA infection  nor to guide or monitor treatment for MRSA infections. RESULT CALLED TO, READ BACK BY AND VERIFIED WITH: H. Laurence Compton 0350 03/23/2018 Mena Goes Performed at [facility], [place] [address], [place],  [address]          Radiology Studies: Dg Chest 1 View  Result Date: 03/22/2018 CLINICAL DATA:  Closed fracture of the right hip.  Preop. EXAM: CHEST  1 VIEW COMPARISON:  05/18/2017 FINDINGS: Heart is top-normal in size. There is aortic atherosclerosis. No definite aneurysm. Left-sided pacemaker apparatus right atrial and right ventricular leads are redemonstrated. There is scarring overlying the left costophrenic angle and to a lesser degree the right costophrenic angle. Clearing of right-sided pleural effusions since previous exam. No overt pulmonary edema or pulmonary consolidation. Intact bony thorax. 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 IMPRESSION: No pneumonia.  Bibasilar scarring.  Aortic atherosclerosis. Electronically Signed   By: Ashley Royalty M.D.   On: 03/22/2018 01:42   Ct Head Wo Contrast  Result Date: 03/22/2018 CLINICAL DATA:  Status post fall last night. EXAM: CT HEAD WITHOUT CONTRAST TECHNIQUE: Contiguous axial images were obtained from the base of the skull through the vertex without intravenous contrast. COMPARISON:  May 13, 2017 FINDINGS: Brain: No evidence of acute infarction, hemorrhage, hydrocephalus, extra-axial collection or mass lesion/mass effect. There is chronic diffuse atrophy. Chronic bilateral periventricular white matter small vessel ischemic changes noted. Small old right basal ganglia lacunar infarction is identified unchanged. Vascular: No hyperdense vessel or unexpected calcification. 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 Sinuses/Orbits: No acute abnormality. 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 Other: Right parietal scalp swelling and hematoma is noted.  IMPRESSION: No focal acute intracranial abnormality identified. Right parietal scalp swelling hematoma is identified. Electronically Signed   By: Abelardo Diesel M.D.   On: 03/22/2018 07:53   Dg Hip Unilat With Pelvis 2-3 Views Left  Result Date: 03/22/2018 CLINICAL DATA:  84 year old female with fall. EXAM: DG HIP (WITH OR WITHOUT PELVIS) 2-3V LEFT; DG HIP (WITH OR WITHOUT PELVIS) 2-3V RIGHT COMPARISON:  None. FINDINGS: There is a comminuted and mildly displaced intertrochanteric fracture of the right femur with mild proximal migration of the femoral diaphysis. There is no fracture or dislocation of the left hip. Sclerotic area involving the right inferior pubic ramus, age indeterminate, possibly chronic. Clinical correlation is recommended. There is advanced osteopenia. There is  degenerative changes of the lower lumbar spine. The soft tissues are grossly unremarkable. IMPRESSION: Mildly displaced intertrochanteric fracture of the right femur. No dislocation. Electronically Signed   By: Anner Crete M.D.   On: 03/22/2018 01:39   Dg Hip Unilat  With Pelvis 2-3 Views Right  Result Date: 03/22/2018 CLINICAL DATA:  84 year old female with fall. EXAM: DG HIP (WITH OR WITHOUT PELVIS) 2-3V LEFT; DG HIP (WITH OR WITHOUT PELVIS) 2-3V RIGHT COMPARISON:  None. FINDINGS: There is a comminuted and mildly displaced intertrochanteric fracture of the right femur with mild proximal migration of the femoral diaphysis. There is no fracture or dislocation of the left hip. Sclerotic area involving the right inferior pubic ramus, age indeterminate, possibly chronic. Clinical correlation is recommended. There is advanced osteopenia. There is degenerative changes of the lower lumbar spine. The soft tissues are grossly unremarkable. IMPRESSION: Mildly displaced intertrochanteric fracture of the right femur. No dislocation. Electronically Signed   By: Anner Crete M.D.   On: 03/22/2018 01:39        Scheduled Meds: .  atorvastatin  40 mg Oral q1800  . B-complex with vitamin C  1 tablet Oral Daily  . docusate sodium  100 mg Oral BID  . insulin aspart  0-15 Units Subcutaneous TID WC   Continuous Infusions: . sodium chloride Stopped (03/22/18 1521)  . sodium chloride 75 mL/hr at 03/23/18 0325  . cefTRIAXone (ROCEPHIN)  IV Stopped (03/23/18 0528)     LOS: 1 day     Georgette Shell, MD [facility]  If 7PM-7AM, please contact night-coverage [email or web address] Password [email or web address] 03/23/2018, 9:15 AM

## 2018-03-23 NOTE — Anesthesia Preprocedure Evaluation (Addendum)
Anesthesia Evaluation  Patient identified by MRN, date of birth, ID band Patient awake    Reviewed: Allergy & Precautions, H&P , NPO status , Patient's Chart, lab work & pertinent test results, reviewed documented beta blocker date and time   Airway Mallampati: III  TM Distance: >3 FB Neck ROM: Full    Dental no notable dental hx. (+) Teeth Intact, Dental Advisory Given   Pulmonary neg pulmonary ROS,    Pulmonary exam normal breath sounds clear to auscultation       Cardiovascular hypertension, Pt. on medications and Pt. on home beta blockers + Peripheral Vascular Disease  + dysrhythmias Atrial Fibrillation + pacemaker  Rhythm:Irregular Rate:Tachycardia     Neuro/Psych Depression negative neurological ROS     GI/Hepatic negative GI ROS, Neg liver ROS,   Endo/Other  negative endocrine ROSdiabetes, Type 2, Oral Hypoglycemic Agents  Renal/GU Renal InsufficiencyRenal disease  negative genitourinary   Musculoskeletal   Abdominal   Peds  Hematology negative hematology ROS (+)   Anesthesia Other Findings   Reproductive/Obstetrics negative OB ROS                            Anesthesia Physical Anesthesia Plan  ASA: III  Anesthesia Plan: General   Post-op Pain Management:    Induction: Intravenous  PONV Risk Score and Plan: 4 or greater and Ondansetron, Dexamethasone and Treatment may vary due to age or medical condition  Airway Management Planned: Oral ETT  Additional Equipment:   Intra-op Plan:   Post-operative Plan: Extubation in OR and Possible Post-op intubation/ventilation  Informed Consent: I have reviewed the patients History and Physical, chart, labs and discussed the procedure including the risks, benefits and alternatives for the proposed anesthesia with the patient or authorized representative who has indicated his/her understanding and acceptance.   Dental advisory  given  Plan Discussed with: CRNA  Anesthesia Plan Comments:        Anesthesia Quick Evaluation

## 2018-03-24 ENCOUNTER — Encounter (HOSPITAL_COMMUNITY): Payer: Self-pay | Admitting: Orthopaedic Surgery

## 2018-03-24 DIAGNOSIS — N390 Urinary tract infection, site not specified: Secondary | ICD-10-CM | POA: Diagnosis present

## 2018-03-24 DIAGNOSIS — Z01811 Encounter for preprocedural respiratory examination: Secondary | ICD-10-CM

## 2018-03-24 LAB — BASIC METABOLIC PANEL
ANION GAP: 12 (ref 5–15)
BUN: 32 mg/dL — ABNORMAL HIGH (ref 6–20)
CALCIUM: 8.3 mg/dL — AB (ref 8.9–10.3)
CO2: 20 mmol/L — AB (ref 22–32)
Chloride: 111 mmol/L (ref 101–111)
Creatinine, Ser: 1.3 mg/dL — ABNORMAL HIGH (ref 0.44–1.00)
GFR, EST AFRICAN AMERICAN: 41 mL/min — AB (ref >60)
GFR, EST NON AFRICAN AMERICAN: 35 mL/min — AB (ref >60)
Glucose, Bld: 269 mg/dL — ABNORMAL HIGH (ref 65–99)
Potassium: 4.9 mmol/L (ref 3.5–5.1)
Sodium: 143 mmol/L (ref 135–145)

## 2018-03-24 LAB — CBC WITH DIFFERENTIAL/PLATELET
BASOS ABS: 0 10*3/uL (ref 0.0–0.1)
BASOS PCT: 0 %
Eosinophils Absolute: 0 10*3/uL (ref 0.0–0.7)
Eosinophils Relative: 0 %
HEMATOCRIT: 24.9 % — AB (ref 36.0–46.0)
Hemoglobin: 8.1 g/dL — ABNORMAL LOW (ref 12.0–15.0)
Lymphocytes Relative: 7 %
Lymphs Abs: 0.5 10*3/uL — ABNORMAL LOW (ref 0.7–4.0)
MCH: 30.3 pg (ref 26.0–34.0)
MCHC: 32.5 g/dL (ref 30.0–36.0)
MCV: 93.3 fL (ref 78.0–100.0)
MONO ABS: 0.3 10*3/uL (ref 0.1–1.0)
Monocytes Relative: 4 %
NEUTROS ABS: 6 10*3/uL (ref 1.7–7.7)
NEUTROS PCT: 89 %
Platelets: 135 10*3/uL — ABNORMAL LOW (ref 150–400)
RBC: 2.67 MIL/uL — ABNORMAL LOW (ref 3.87–5.11)
RDW: 16.2 % — ABNORMAL HIGH (ref 11.5–15.5)
WBC: 6.8 10*3/uL (ref 4.0–10.5)

## 2018-03-24 LAB — URINE CULTURE: Culture: >=100000 — AB

## 2018-03-24 LAB — PROTIME-INR
INR: 1.2
PROTHROMBIN TIME: 15.1 s (ref 11.4–15.2)

## 2018-03-24 LAB — GLUCOSE, CAPILLARY
GLUCOSE-CAPILLARY: 284 mg/dL — AB (ref 65–99)
GLUCOSE-CAPILLARY: 200 mg/dL — AB (ref 65–99)
GLUCOSE-CAPILLARY: 179 mg/dL — AB (ref 65–99)
Glucose-Capillary: 152 mg/dL — ABNORMAL HIGH (ref 65–99)

## 2018-03-24 MED ORDER — SODIUM CHLORIDE 0.9 % IV BOLUS
500.0000 mL | Freq: Once | INTRAVENOUS | Status: AC
  Administered 2018-03-24: 500 mL via INTRAVENOUS

## 2018-03-24 MED ORDER — METOPROLOL SUCCINATE ER 100 MG PO TB24
100.0000 mg | ORAL_TABLET | Freq: Every day | ORAL | Status: DC
  Administered 2018-03-24 – 2018-03-25 (×2): 100 mg via ORAL
  Filled 2018-03-24 (×2): qty 1

## 2018-03-24 MED ORDER — GLIMEPIRIDE 4 MG PO TABS
2.0000 mg | ORAL_TABLET | Freq: Every day | ORAL | Status: DC
  Administered 2018-03-25: 2 mg via ORAL
  Filled 2018-03-24: qty 1

## 2018-03-24 MED ORDER — WARFARIN SODIUM 6 MG PO TABS
6.0000 mg | ORAL_TABLET | Freq: Once | ORAL | Status: AC
  Administered 2018-03-24: 6 mg via ORAL
  Filled 2018-03-24: qty 1

## 2018-03-24 NOTE — Progress Notes (Signed)
Subjective: 1 Day Post-Op Procedure(s) (LRB): INTRAMEDULLARY (IM) NAIL RIGHT INTERTROCHANTRIC (Right) Patient reports pain as moderate.  Family is at the bedside.  Does have acute blood loss anemia from her fracture and surgery.  Objective: Vital signs in last 24 hours: Temp:  [97.6 F (36.4 C)-98.5 F (36.9 C)] 98.5 F (36.9 C) (05/14 0847) Pulse Rate:  [72-121] 101 (05/14 0847) Resp:  [12-29] 14 (05/13 2038) BP: (72-148)/(44-100) 123/63 (05/14 0847) SpO2:  [85 %-100 %] 100 % (05/14 0847) FiO2 (%):  [0 %] 0 % (05/13 2033)  Intake/Output from previous day: 05/13 0701 - 05/14 0700 In: 1325 [I.V.:825; IV Piggyback:500] Out: 1190 [Urine:1090; Blood:100] Intake/Output this shift: Total I/O In: 120 [P.O.:120] Out: -   Recent Labs    03/22/18 0955 03/22/18 1335 03/22/18 2119 03/23/18 0229 03/24/18 0306  HGB 8.3* 7.9* 10.5* 9.5* 8.1*   Recent Labs    03/23/18 0229 03/23/18 0934 03/24/18 0306  WBC 9.0  --  6.8  RBC 3.25* 3.05* 2.67*  HCT 28.9*  --  24.9*  PLT 145*  --  135*   Recent Labs    03/23/18 0229 03/24/18 0306  NA 143 143  K 4.9 4.9  CL 112* 111  CO2 25 20*  BUN 40* 32*  CREATININE 1.46* 1.30*  GLUCOSE 194* 269*  CALCIUM 8.2* 8.3*   Recent Labs    03/23/18 0229 03/24/18 0306  INR 1.23 1.20    Sensation intact distally Intact pulses distally Dorsiflexion/Plantar flexion intact Incision: moderate drainage  Assessment/Plan: 1 Day Post-Op Procedure(s) (LRB): INTRAMEDULLARY (IM) NAIL RIGHT INTERTROCHANTRIC (Right) Up with therapy - attempt full WBAT right hip Will need skilled nursing placement post her hospital stay     Mcarthur Rossetti 03/24/2018, 12:19 PM

## 2018-03-24 NOTE — Op Note (Signed)
NAMEJAKIYAH, STEPNEY MEDICAL RECORD [id number] ACCOUNT [id number] DATE OF BIRTH:01/24/1925 FACILITY: [facility] LOCATION: [facility] PHYSICIAN:Markeisha Mancias Kerry [place], MD  OPERATIVE REPORT  DATE OF PROCEDURE:  03/23/2018  PREOPERATIVE DIAGNOSIS:  Right complex intertrochanteric/subtrochanteric femur fracture.  POSTOPERATIVE DIAGNOSIS:  Right complex intertrochanteric/subtrochanteric femur fracture.  PROCEDURE:  Open reduction and fixation of right intertrochanteric/subtrochanteric femur fracture using intramedullary rod and hip screw.  IMPLANTS:  Stryker 11 x 360 gamma nail with a 95 mm lag screw and 1 distal interlocking screw.  SURGEON:  Lind Guest. Ninfa Linden, MD  ASSISTANT:  Lou Cal, PA-C  ANESTHESIA:  General.  ANTIBIOTICS:  2 g IV Ancef.  ESTIMATED BLOOD LOSS:  Between 100 and 200 mL.  COMPLICATIONS:  None.  INDICATIONS:  The patient is a 84 year old female who does ambulate at home with a walker.  She had no previous right hip issues but did sustain a mechanical fall late Saturday evening/early Sunday morning.  She was seen in the emergency room at [facility] and found to have a complex intertrochanteric/subtrochanteric femur fracture on the right side.  She was admitted to the medicine service and was stabilized by medicine and cardiology.  I talked to her family in detail and the patient and did  recommend surgery given the severity of her pain and quality-of-life issues.  She has received blood already as well.  This is certainly a moderate to high risk surgery given her age; however, quality of life is certainly an issue.  We need to get this  stabilized, and her and her family have agreed to this.  DESCRIPTION OF PROCEDURE:  After informed consent was obtained, the appropriate right hip was marked.  She was brought to the operating room where general anesthesia was obtained while she was in her stretcher.  Next, she was placed supine on the  fracture table with a perineal  post in place, and her right operative leg in in-line skeletal traction of the left hip and a well-leg holder and out of the field with appropriate padding.  We then assessed the fracture under direct fluoroscopy, and with  traction, got it into a better alignment overall, but it was certainly significantly flexed and externally rotated as well proximally.  We then chose our rod, keeping it sterile in a box, using direct fluoroscopy, and chose an 11 x 360 nail.  This was  for right hip.  We passed it off the back table to have it opened sterilely.  We then prepped her right hip, thigh and leg with DuraPrep and sterile drapes.  A time-out was called, and she was identified as the correct patient and the correct right hip.   We then made an extensile incision over the trochanteric area, knowing that we were going to have to reduce the fracture better and dissect down to the tip of the greater trochanter.  I was able to use a Cobb and push down the fracture and get it into  certainly a more adequately aligned position.  We then placed a guide pin through the greater trochanter down to the shaft area in order to open up the femoral canal with initiating reamer.  We then passed our rod down the canal without difficulty.   Using the outrigger guide, we made a separate lateral incision, and then we were able to put a temporary guide pin within the lateral cortex of the femur, traversing the fracture and into adequate position of the femoral head and neck, keeping it  slightly inferior and slightly  posterior.  This was verified in AP and lateral planes.  We then took a measurement off of that and drilled for 95 mm lag screw.  We locked the rod from the top and then placed the compression component as well to compress  the fracture.  We then removed our Cobb and our outrigger guide, and we were pleased with the overall gross alignment.  We then placed a single distal interlocking screw from lateral to medial through a  separate incision site and verified its placement  under fluoroscopy.  We then irrigated all 3 wounds with normal saline solution.  We closed the deep tissue with 0 Vicryl followed by 2-0 Vicryl subcutaneous tissue, and interrupted staples on the skin.  A well-padded sterile dressing was applied.  She  was awakened, extubated, and taken to recovery room in stable condition.  All final counts were correct.  There were no complications noted.  Of note, Benita Stabile, PA-C, assisted the entire case.  His assistance was crucial for facilitating all aspects of  this case.  LN/NUANCE  D:03/23/2018 T:03/24/2018 JOB:[id number]

## 2018-03-24 NOTE — Progress Notes (Signed)
PROGRESS NOTE    Kylie Sanchez  OIZ:[id number] DOB: [birth year] DOA: 03/21/2018 PCP: Unk Pinto, MD   Brief Narrative: Kylie Sanchez is a 84 y.o. female with a known history of CKD, HTN, HLD, Afib on Coumadin, DM presented to the emergency department for evaluation of right hip pain s/p mechanical fall.  Patient was in a usual state of health until the evening of fall when she got was not using her walker and fell landing on hardwood floors.  In the ED patient received morphine and Rocephin.  She was hypotensive and progressively anemic so she received IV fluid resuscitation and 2 units of packed RBC.  Blood pressure improved.  She also received FFP x1 including vitamin K for Coumadin reversal in preparation for surgery. patient was admitted to hospital for right intertrochanteric hip fracture and following conditions were addressed during hospitalization,  Assessment & Plan:   Principal Problem:   Closed right hip fracture, initial encounter [facility]) Active Problems:   Hyperlipidemia   Persistent atrial fibrillation (HCC)   Type 2 diabetes mellitus with hyperlipidemia (HCC)   Hip fracture ([place])   Acute lower UTI  Closed right intertrochanteric fracture status post IM nailing by orthopedics.  Orthopedics on board.  Full weightbearing of the right hip.  Patient will likely need a skilled nursing facility for rehab on discharge.  Patient will be resumed on Coumadin   Acute blood loss anemia from her fracture and surgery.  Status post blood transfusion.  Currently stable vitals.  Hypotension on presentation.  Resolved at this time.  Was on metoprolol at home.  Diabetes mellitus type 2.  On metformin and Amaryl at home.  On sliding scale insulin diabetic diet here.  Closely monitor.  Klebsiella UTI.  On Rocephin.  Will continue to complete the course  History of atrial fibrillation on Coumadin.currently RVR.  Will resume metoprolol succinate 100 mg once a day, she takes twice a day at home.   Focus on pain control.  Continue hydration.  He does have some anxiety issues as well   DVT prophylaxis: Warfarin  Code Status: DO NOT RESUSCITATE  Family Communication:  Disposition Plan: Skilled nursing facility   Consultants: Orthopedics  Procedures: Open reduction and internal fixation of right subtrochanteric femur fracture by intramedullary rod and hip screw on 03/23/2018  Antimicrobials: IV Rocephin  Subjective: Complains of pain in the hip, nurse feels anxiety. Complains of sore-throat.   Objective: Vitals:   03/24/18 0235 03/24/18 0631 03/24/18 0847 03/24/18 1355  BP: 113/69 110/61 123/63 (!) 89/49  Pulse: (!) 121 (!) 118 (!) 101 (!) 121  Resp:    16  Temp: 97.6 F (36.4 C) 98.4 F (36.9 C) 98.5 F (36.9 C) 98.3 F (36.8 C)  TempSrc: Oral Axillary Oral Oral  SpO2: 99% 91% 100% (!) 85%  Weight:      Height:        Intake/Output Summary (Last 24 hours) at 03/24/2018 1359 Last data filed at 03/24/2018 0900 Gross per 24 hour  Intake 845 ml  Output 790 ml  Net 55 ml   Filed Weights   03/21/18 2350  Weight: 63.5 kg (140 lb)    Examination:  General exam: Appears calm and comfortable ,Not in distress,average built.  On nasal cannula HEENT:PERRL,Oral mucosa moist, Ear/Nose normal on gross exam.  Pallor noted.  Left eye enucleation Respiratory system: Bilateral equal air entry, normal vesicular breath sounds, no wheezes or crackles  Cardiovascular system: S1 & S2 heard, irregularly regular rhythm with tachycardia, no  JVD, murmurs, rubs, gallops or clicks. No pedal edema. Gastrointestinal system: Abdomen is nondistended, soft and nontender. No organomegaly or masses felt. Normal bowel sounds heard. Foley catheter Central nervous system: Alert and oriented. No focal neurological deficits. Extremities: No edema, no clubbing ,no cyanosis, distal peripheral pulses palpable.  Status post right hip surgery Skin: No rashes, lesions or ulcers,no icterus ,no pallor MSK:  Normal muscle bulk,tone ,power.  Right thigh with a surgical dressing from hip surgery Psychiatry: Judgement and insight appear normal. Mood & affect appropriate.   Data Reviewed: I have personally reviewed following labs and imaging studies  CBC: Recent Labs  Lab 03/22/18 0007 03/22/18 0430 03/22/18 0955 03/22/18 1335 03/22/18 2119 03/23/18 0229 03/24/18 0306  WBC 17.7* 16.8*  --  12.1* 10.5 9.0 6.8  NEUTROABS 14.6*  --   --   --   --  6.9 6.0  HGB 11.0* 9.3* 8.3* 7.9* 10.5* 9.5* 8.1*  HCT 33.8* 29.3* 26.2* 24.9* 31.7* 28.9* 24.9*  MCV 92.6 93.6  --  94.0 89.3 88.9 93.3  PLT 278 255  --  239 157 145* 735*   Basic Metabolic Panel: Recent Labs  Lab 03/22/18 0007 03/22/18 0430 03/22/18 2119 03/23/18 0229 03/24/18 0306  NA 139 140 143 143 143  K 4.8 5.2* 5.0 4.9 4.9  CL 104 109 111 112* 111  CO2 24 21* 25 25 20*  GLUCOSE 234* 231* 174* 194* 269*  BUN 45* 44* 40* 40* 32*  CREATININE 1.37* 1.49* 1.51* 1.46* 1.30*  CALCIUM 9.0 8.5* 8.4* 8.2* 8.3*   GFR: Estimated Creatinine Clearance: 25.2 mL/min (A) (by C-G formula based on SCr of 1.3 mg/dL (H)). Liver Function Tests: Recent Labs  Lab 03/22/18 0007 03/23/18 0229  AST 25 21  ALT 26 20  ALKPHOS 76 48  BILITOT 0.4 0.5  PROT 6.3* 5.4*  ALBUMIN 3.2* 2.7*   No results for input(s): LIPASE, AMYLASE in the last 168 hours. No results for input(s): AMMONIA in the last 168 hours. Coagulation Profile: Recent Labs  Lab 03/22/18 0007 03/22/18 0430 03/22/18 1335 03/23/18 0229 03/24/18 0306  INR 2.92 3.17 1.57 1.23 1.20   Cardiac Enzymes: Recent Labs  Lab 03/22/18 2119  TROPONINI 0.05*   BNP (last 3 results) No results for input(s): PROBNP in the last 8760 hours. HbA1C: No results for input(s): HGBA1C in the last 72 hours. CBG: Recent Labs  Lab 03/23/18 0826 03/23/18 1129 03/23/18 1933 03/24/18 0628 03/24/18 1157  GLUCAP 173* 164* 183* 284* 200*   Lipid Profile: No results for input(s): CHOL, HDL,  LDLCALC, TRIG, CHOLHDL, LDLDIRECT in the last 72 hours. Thyroid Function Tests: No results for input(s): TSH, T4TOTAL, FREET4, T3FREE, THYROIDAB in the last 72 hours. Anemia Panel: Recent Labs    03/23/18 0934  VITAMINB12 402  FOLATE 28.6  FERRITIN 70  TIBC 251  IRON 27*  RETICCTPCT 1.8   Sepsis Labs: No results for input(s): PROCALCITON, LATICACIDVEN in the last 168 hours.  Recent Results (from the past 240 hour(s))  Urine culture     Status: Abnormal   Collection Time: 03/22/18  4:30 AM  Result Value Ref Range Status   Specimen Description   Final    URINE, CATHETERIZED Performed at [facility], [address] [place], [place] [address]    Special Requests NONE  Final   Culture >=100,000 COLONIES/mL KLEBSIELLA PNEUMONIAE (A)  Final   Report Status 03/24/2018 FINAL  Final   Organism ID, Bacteria KLEBSIELLA PNEUMONIAE (A)  Final  Susceptibility   Klebsiella pneumoniae - MIC*    AMPICILLIN RESISTANT Resistant     CEFAZOLIN <=4 SENSITIVE Sensitive     CEFTRIAXONE <=1 SENSITIVE Sensitive     CIPROFLOXACIN <=0.25 SENSITIVE Sensitive     GENTAMICIN <=1 SENSITIVE Sensitive     IMIPENEM <=0.25 SENSITIVE Sensitive     NITROFURANTOIN 64 INTERMEDIATE Intermediate     TRIMETH/SULFA <=20 SENSITIVE Sensitive     AMPICILLIN/SULBACTAM 4 SENSITIVE Sensitive     PIP/TAZO <=4 SENSITIVE Sensitive     Extended ESBL NEGATIVE Sensitive     * >=100,000 COLONIES/mL KLEBSIELLA PNEUMONIAE  MRSA PCR Screening     Status: Abnormal   Collection Time: 03/23/18  2:57 AM  Result Value Ref Range Status   MRSA by PCR POSITIVE (A) NEGATIVE Final    Comment:        The GeneXpert MRSA Assay (FDA approved for NASAL specimens only), is one component of a comprehensive MRSA colonization surveillance program. It is not intended to diagnose MRSA infection nor to guide or monitor treatment for MRSA infections. RESULT CALLED TO, READ BACK BY AND VERIFIED WITH: H. Laurence Compton [id number] 03/23/2018  Mena Goes Performed at [facility], [place] [address] [place],  [address]          Radiology Studies: Dg C-arm 1-60 Min  Result Date: 03/23/2018 CLINICAL DATA:  Right hip fracture EXAM: DG C-ARM 61-120 MIN; RIGHT FEMUR 2 VIEWS COMPARISON:  03/22/2018 FINDINGS: Total fluoroscopy time was 3 minutes 24 seconds. Six low resolution intraoperative spot views of the right hip. Images demonstrate intramedullary rod and screw fixation of the right femur for intertrochanteric fracture. IMPRESSION: Intraoperative fluoroscopic assistance provided during right hip surgery Electronically Signed   By: Donavan Foil M.D.   On: 03/23/2018 19:18   Dg Femur, Min 2 Views Right  Result Date: 03/23/2018 CLINICAL DATA:  Right hip fracture EXAM: DG C-ARM 61-120 MIN; RIGHT FEMUR 2 VIEWS COMPARISON:  03/22/2018 FINDINGS: Total fluoroscopy time was 3 minutes 24 seconds. Six low resolution intraoperative spot views of the right hip. Images demonstrate intramedullary rod and screw fixation of the right femur for intertrochanteric fracture. IMPRESSION: Intraoperative fluoroscopic assistance provided during right hip surgery Electronically Signed   By: Donavan Foil M.D.   On: 03/23/2018 19:18        Scheduled Meds: . atorvastatin  40 mg Oral q1800  . B-complex with vitamin C  1 tablet Oral Daily  . docusate sodium  100 mg Oral BID  . insulin aspart  0-15 Units Subcutaneous TID WC  . mupirocin ointment   Nasal BID  . warfarin  6 mg Oral ONCE-1800  . Warfarin - Pharmacist Dosing Inpatient   Does not apply q1800   Continuous Infusions: . sodium chloride 75 mL/hr at 03/24/18 1237  . cefTRIAXone (ROCEPHIN)  IV Stopped (03/24/18 0435)  . lactated ringers       LOS: 2 days    Time spent: More than 50% of that time was spent in counseling and/or coordination of care.   Flora Lipps, MD [facility] Pager [phone]  If 7PM-7AM, please contact night-coverage [email or web address] Password  [facility] 03/24/2018, 1:59 PM

## 2018-03-24 NOTE — Evaluation (Signed)
Physical Therapy Evaluation Patient Details Name: Kylie Sanchez MRN: [id number] DOB: [birth year] Today's Date: 03/24/2018   History of Present Illness  [name] is a 84 y.o. female with a known history of CKD, HTN, HLD, Afib on Coumadin, DM presented to the emergency department for evaluation of right hip pain s/p mechanical fall.  Patient was in a usual state of health until the evening of fall when she got was not using her walker and fell landing on hardwood floors.  Pt sustained R hip fx s/p IM nail. Pt also with acute UTI.   Clinical Impression  Patient is s/p above surgery resulting in functional limitations due to the deficits listed below (see PT Problem List). Pt very anxious about mobilizing. Assisted to EOB with max A. Pt wanted to attempt standing, max A given but pt could not tolerate as soon as wt went onto LE's. Max A to return to supine.  Patient will benefit from skilled PT to increase their independence and safety with mobility to allow discharge to the venue listed below.       Follow Up Recommendations SNF;Supervision/Assistance - 24 hour    Equipment Recommendations  None recommended by PT    Recommendations for Other Services       Precautions / Restrictions Precautions Precautions: Fall Precaution Comments: no L eye Restrictions Weight Bearing Restrictions: Yes RLE Weight Bearing: Weight bearing as tolerated      Mobility  Bed Mobility Overal bed mobility: Needs Assistance Bed Mobility: Supine to Sit;Sit to Supine     Supine to sit: Max assist Sit to supine: Max assist   General bed mobility comments: max A for LE's to EOB and trunk elevation. Max A for return to supine  Transfers Overall transfer level: Needs assistance   Transfers: Sit to/from Stand Sit to Stand: Max assist         General transfer comment: pt wanted to try to stand, attempted with max A but pt did not clear bed before wanting to sit back down fully due to  pain  Ambulation/Gait             General Gait Details: unable  Stairs            Wheelchair Mobility    Modified Rankin (Stroke Patients Only)       Balance Overall balance assessment: Needs assistance;History of Falls Sitting-balance support: Bilateral upper extremity supported Sitting balance-Leahy Scale: Poor Sitting balance - Comments: needs UE support and min A to maintain sitting. L and posterior lean Postural control: Left lateral lean;Posterior lean Standing balance support: Bilateral upper extremity supported Standing balance-Leahy Scale: Zero                               Pertinent Vitals/Pain Pain Assessment: 0-10 Pain Score: 10-Worst pain ever Pain Location: R hip with mvmt Pain Descriptors / Indicators: Operative site guarding;Aching Pain Intervention(s): Limited activity within patient's tolerance;Monitored during session;Premedicated before session    Home Living Family/patient expects to be discharged to:: Skilled nursing facility Living Arrangements: Other relatives Available Help at Discharge: Available PRN/intermittently;Family Type of Home: House Home Access: Ramped entrance     Home Layout: One level Home Equipment: [place] - 2 wheels;Shower seat Additional Comments: lives with her brother    Prior Function Level of Independence: Needs assistance   Gait / Transfers Assistance Needed: uses RW limited household ambulation  ADL's / Homemaking Assistance Needed: independent with ADLS, assist  with iADLs        Hand Dominance   Dominant Hand: Right    Extremity/Trunk Assessment   Upper Extremity Assessment Upper Extremity Assessment: Generalized weakness    Lower Extremity Assessment Lower Extremity Assessment: Generalized weakness;RLE deficits/detail RLE Deficits / Details: hip flex <3/5, pt tending to hold R hip in IR. Knee ext 3-/5 RLE: Unable to fully assess due to pain RLE Sensation: WNL RLE Coordination:  WNL    Cervical / Trunk Assessment Cervical / Trunk Assessment: Kyphotic  Communication   Communication: HOH  Cognition Arousal/Alertness: Awake/alert Behavior During Therapy: WFL for tasks assessed/performed Overall Cognitive Status: No family/caregiver present to determine baseline cognitive functioning                                 General Comments: pt appropriate with basic questions on eval. Was not oriented to time of day      General Comments General comments (skin integrity, edema, etc.): O2 sats in mid 90's on 2L O2. Pt very anxious with all mobility    Exercises General Exercises - Lower Extremity Ankle Circles/Pumps: AROM;Both;10 reps;Seated Long Arc Quad: AROM;Right;10 reps;Seated   Assessment/Plan    PT Assessment Patient needs continued PT services  PT Problem List Decreased strength;Decreased range of motion;Decreased activity tolerance;Decreased balance;Decreased mobility;Decreased cognition;Decreased knowledge of use of DME;Decreased knowledge of precautions;Pain       PT Treatment Interventions DME instruction;Gait training;Functional mobility training;Therapeutic activities;Therapeutic exercise;Balance training;Neuromuscular re-education;Patient/family education    PT Goals (Current goals can be found in the Care Plan section)  Acute Rehab PT Goals Patient Stated Goal: walk again PT Goal Formulation: With patient Time For Goal Achievement: 04/07/18 Potential to Achieve Goals: Fair    Frequency Min 3X/week   Barriers to discharge Decreased caregiver support      Co-evaluation               AM-PAC PT "6 Clicks" Daily Activity  Outcome Measure Difficulty turning over in bed (including adjusting bedclothes, sheets and blankets)?: Unable Difficulty moving from lying on back to sitting on the side of the bed? : Unable Difficulty sitting down on and standing up from a chair with arms (e.g., wheelchair, bedside commode, etc,.)?:  Unable Help needed moving to and from a bed to chair (including a wheelchair)?: Total Help needed walking in hospital room?: Total Help needed climbing 3-5 steps with a railing? : Total 6 Click Score: 6    End of Session   Activity Tolerance: Patient limited by pain Patient left: in bed;with call bell/phone within reach;with bed alarm set Nurse Communication: Mobility status PT Visit Diagnosis: Unsteadiness on feet (R26.81);Repeated falls (R29.6);Muscle weakness (generalized) (M62.81);Difficulty in walking, not elsewhere classified (R26.2);Pain Pain - Right/Left: Right Pain - part of body: Hip    Time: 0881-1031 PT Time Calculation (min) (ACUTE ONLY): 38 min   Charges:   PT Evaluation $PT Eval Moderate Complexity: 1 Mod PT Treatments $Therapeutic Activity: 23-37 mins   PT G Codes:        [place] 03/24/2018, 4:30 PM

## 2018-03-24 NOTE — Progress Notes (Signed)
OT Cancellation Note  Patient Details Name: Kylie Sanchez MRN: [id number] DOB: March 04, 1925   Cancelled Treatment:    Reason Eval/Treat Not Completed: Pain limiting ability to participate  Pt had just finished repositioning in bed with nursing.  Pt had screamed in pain with this movement.  Will attempt to work with pt later in day or next day as pt able.  OT did speak to nephew who reported pt had done rehab at [facility] in the past and they would likely be interested in this as an option.  Mickel Baas [place], [place] 03/24/2018, 12:49 PM

## 2018-03-24 NOTE — Progress Notes (Signed)
ANTICOAGULATION CONSULT NOTE - Follow Up Consult  Pharmacy Consult for Warfarin Indication: atrial fibrillation  Allergies  Allergen Reactions  . No Known Allergies     Patient Measurements: Height: 5\' 2"  (157.5 cm) Weight: (Unable to get weight due to bed scale broken ) IBW/kg (Calculated) : 50.1  Vital Signs: Temp: 98.5 F (36.9 C) (05/14 0847) Temp Source: Oral (05/14 0847) BP: 123/63 (05/14 0847) Pulse Rate: 101 (05/14 0847)  Labs: Recent Labs    03/22/18 0007  03/22/18 1335 03/22/18 2119 03/23/18 0229 03/24/18 0306  HGB 11.0*   < > 7.9* 10.5* 9.5* 8.1*  HCT 33.8*   < > 24.9* 31.7* 28.9* 24.9*  PLT 278   < > 239 157 145* 135*  APTT 32  --   --   --   --   --   LABPROT 30.3*   < > 18.6*  --  15.4* 15.1  INR 2.92   < > 1.57  --  1.23 1.20  CREATININE 1.37*   < >  --  1.51* 1.46* 1.30*  TROPONINI  --   --   --  0.05*  --   --    < > = values in this interval not displayed.    Estimated Creatinine Clearance: 25.2 mL/min (A) (by C-G formula based on SCr of 1.3 mg/dL (H)).   Assessment: 84 year old female resuming warfarin for PAF following surgery for a hip fracture. Given vitamin K 10 mg on 5/12.  Dose was not charted as given last night. INR is subtherapeutic today at 1.2. No bleeding noted, Hgb down to 8.1, platelets are 135.  PTA regimen: 4 mg daily exc 2 mg on Tue/Sat  Goal of Therapy:  INR 2-3 Monitor platelets by anticoagulation protocol: Yes   Plan:  Warfarin 6 mg PO tonight Daily INR Monitor for s/sx of bleeding   Renold Genta, PharmD, BCPS Clinical Pharmacist Clinical phone for 03/24/2018 until 3p is [phone] After 3p, please call Main Rx at [phone] for assistance 03/24/2018 10:50 AM

## 2018-03-24 NOTE — Anesthesia Postprocedure Evaluation (Signed)
Anesthesia Post Note  Patient: Kylie Sanchez  Procedure(s) Performed: INTRAMEDULLARY (IM) NAIL RIGHT INTERTROCHANTRIC (Right Leg Upper)     Patient location during evaluation: PACU Anesthesia Type: General Level of consciousness: awake and alert Pain management: pain level controlled Vital Signs Assessment: post-procedure vital signs reviewed and stable Respiratory status: spontaneous breathing, nonlabored ventilation, respiratory function stable and patient connected to nasal cannula oxygen Cardiovascular status: blood pressure returned to baseline and stable Postop Assessment: no apparent nausea or vomiting Anesthetic complications: no               Lillie Portner,W. EDMOND

## 2018-03-24 NOTE — Clinical Social Work Note (Signed)
Clinical Social Work Assessment  Patient Details  Name: Edit Ricciardelli MRN: [id number] Date of Birth: [birth year]  Date of referral:  03/24/18               Reason for consult:  Facility Placement                Permission sought to share information with:  Chartered certified accountant granted to share information::  Yes, Verbal Permission Granted  Name::     Kellie Simmering  Agency::  SNF  Relationship::  brother  Contact Information:     Housing/Transportation Living arrangements for the past 2 months:  Single Family Home Source of Information:  Siblings Patient Interpreter Needed:  None Criminal Activity/Legal Involvement Pertinent to Current Situation/Hospitalization:  No - Comment as needed Significant Relationships:  Siblings, Other Family Members Lives with:  Relatives Do you feel safe going back to the place where you live?  No Need for family participation in patient care:  Yes (Comment)  Care giving concerns:  Pt from home with family and will need SNF at discharge.  Social Worker assessment / plan:  Pt disoriented and CSW then called Brother, Kellie Simmering. Brother confirmed that patient resides with him and has caregivers 7 days a week from 9-6:30pm.  Pt has been to SNF in the past and they were interested in [facility]. CSW explained SNF process and will assist with disposition. CSW obtained permission to send to SNF's in [place] to ensure that we have more options. Brother in agreement.  CSW will f/u.  Employment status:  Retired Forensic scientist:  Medicare PT Recommendations:  [place] / Referral to community resources:  [place]  Patient/Family's Response to care:  Family thanked CSW for f/u on disposition. Brother in agreement with SNF at discharge.  Patient/Family's Understanding of and Emotional Response to Diagnosis, Current Treatment, and Prognosis:  Family has good understanding of her diagnosis and are  in agreement with need for SNF. The plan is for patient to receive rehabilitative therapies and return home once complete. Pt has family support and caregiver support at home. CSW will assist with disposition.  Emotional Assessment Appearance:  Appears stated age Attitude/Demeanor/Rapport:  (Unable to assess) Affect (typically observed):  Unable to Assess Orientation:  (Disoriented x 4) Alcohol / Substance use:  Not Applicable Psych involvement (Current and /or in the community):  No (Comment)  Discharge Needs  Concerns to be addressed:  Discharge Planning Concerns Readmission within the last 30 days:  No Current discharge risk:  Dependent with Mobility, Physical Impairment Barriers to Discharge:  No Barriers Identified   Normajean Baxter, LCSW 03/24/2018, 2:57 PM

## 2018-03-25 DIAGNOSIS — E785 Hyperlipidemia, unspecified: Secondary | ICD-10-CM

## 2018-03-25 DIAGNOSIS — I481 Persistent atrial fibrillation: Secondary | ICD-10-CM

## 2018-03-25 DIAGNOSIS — S72441P Displaced fracture of lower epiphysis (separation) of right femur, subsequent encounter for closed fracture with malunion: Secondary | ICD-10-CM

## 2018-03-25 DIAGNOSIS — N3000 Acute cystitis without hematuria: Secondary | ICD-10-CM

## 2018-03-25 DIAGNOSIS — E7849 Other hyperlipidemia: Secondary | ICD-10-CM

## 2018-03-25 DIAGNOSIS — E1169 Type 2 diabetes mellitus with other specified complication: Secondary | ICD-10-CM

## 2018-03-25 LAB — BASIC METABOLIC PANEL
ANION GAP: 8 (ref 5–15)
BUN: 35 mg/dL — ABNORMAL HIGH (ref 6–20)
CHLORIDE: 113 mmol/L — AB (ref 101–111)
CO2: 20 mmol/L — ABNORMAL LOW (ref 22–32)
Calcium: 8.1 mg/dL — ABNORMAL LOW (ref 8.9–10.3)
Creatinine, Ser: 1.41 mg/dL — ABNORMAL HIGH (ref 0.44–1.00)
GFR calc non Af Amer: 32 mL/min — ABNORMAL LOW (ref >60)
GFR, EST AFRICAN AMERICAN: 37 mL/min — AB (ref >60)
Glucose, Bld: 179 mg/dL — ABNORMAL HIGH (ref 65–99)
POTASSIUM: 4.5 mmol/L (ref 3.5–5.1)
SODIUM: 141 mmol/L (ref 135–145)

## 2018-03-25 LAB — CBC
HEMATOCRIT: 22.1 % — AB (ref 36.0–46.0)
HEMOGLOBIN: 6.8 g/dL — AB (ref 12.0–15.0)
MCH: 29.4 pg (ref 26.0–34.0)
MCHC: 30.8 g/dL (ref 30.0–36.0)
MCV: 95.7 fL (ref 78.0–100.0)
Platelets: 148 10*3/uL — ABNORMAL LOW (ref 150–400)
RBC: 2.31 MIL/uL — AB (ref 3.87–5.11)
RDW: 15.3 % (ref 11.5–15.5)
WBC: 8.4 10*3/uL (ref 4.0–10.5)

## 2018-03-25 LAB — PROTIME-INR
INR: 1.27
PROTHROMBIN TIME: 15.8 s — AB (ref 11.4–15.2)

## 2018-03-25 LAB — PREPARE RBC (CROSSMATCH)

## 2018-03-25 LAB — GLUCOSE, CAPILLARY
GLUCOSE-CAPILLARY: 125 mg/dL — AB (ref 65–99)
GLUCOSE-CAPILLARY: 83 mg/dL (ref 65–99)
Glucose-Capillary: 153 mg/dL — ABNORMAL HIGH (ref 65–99)
Glucose-Capillary: 172 mg/dL — ABNORMAL HIGH (ref 65–99)

## 2018-03-25 MED ORDER — HYDROCODONE-ACETAMINOPHEN 5-325 MG PO TABS: 1.0000 | ORAL_TABLET | ORAL | 0 refills | Status: DC | PRN

## 2018-03-25 MED ORDER — WARFARIN SODIUM 6 MG PO TABS
6.0000 mg | ORAL_TABLET | Freq: Once | ORAL | Status: AC
  Administered 2018-03-25: 6 mg via ORAL
  Filled 2018-03-25: qty 1

## 2018-03-25 MED ORDER — SODIUM CHLORIDE 0.9 % IV SOLN
Freq: Once | INTRAVENOUS | Status: AC
  Administered 2018-03-25: 07:00:00 via INTRAVENOUS

## 2018-03-25 MED ORDER — ESCITALOPRAM OXALATE 10 MG PO TABS
10.0000 mg | ORAL_TABLET | Freq: Every day | ORAL | Status: DC
  Administered 2018-03-25 – 2018-03-27 (×3): 10 mg via ORAL
  Filled 2018-03-25 (×3): qty 1

## 2018-03-25 NOTE — Discharge Instructions (Signed)
Attempt full weight bearing as tolerated on right hip. New dry dressing right hip incisions daily as needed.   Information on my medicine - Coumadin   (Warfarin)  This medication education was reviewed with me or my healthcare representative as part of my discharge preparation.  The pharmacist that spoke with me during my hospital stay was:  Saundra Shelling, [facility]  Why was Coumadin prescribed for you? Coumadin was prescribed for you because you have a blood clot or a medical condition that can cause an increased risk of forming blood clots. Blood clots can cause serious health problems by blocking the flow of blood to the heart, lung, or brain. Coumadin can prevent harmful blood clots from forming. As a reminder your indication for Coumadin is:   Stroke Prevention Because Of Atrial Fibrillation  What test will check on my response to Coumadin? While on Coumadin (warfarin) you will need to have an INR test regularly to ensure that your dose is keeping you in the desired range. The INR (international normalized ratio) number is calculated from the result of the laboratory test called prothrombin time (PT).  If an INR APPOINTMENT HAS NOT ALREADY BEEN MADE FOR YOU please schedule an appointment to have this lab work done by your health care provider within 7 days. Your INR goal is usually a number between:  2 to 3 or your provider may give you a more narrow range like 2-2.5.  Ask your health care provider during an office visit what your goal INR is.  What  do you need to  know  About  COUMADIN? Take Coumadin (warfarin) exactly as prescribed by your healthcare provider about the same time each day.  DO NOT stop taking without talking to the doctor who prescribed the medication.  Stopping without other blood clot prevention medication to take the place of Coumadin may increase your risk of developing a new clot or stroke.  Get refills before you run out.  What do you do if you miss a dose? If you  miss a dose, take it as soon as you remember on the same day then continue your regularly scheduled regimen the next day.  Do not take two doses of Coumadin at the same time.  Important Safety Information A possible side effect of Coumadin (Warfarin) is an increased risk of bleeding. You should call your healthcare provider right away if you experience any of the following: ? Bleeding from an injury or your nose that does not stop. ? Unusual colored urine (red or dark brown) or unusual colored stools (red or black). ? Unusual bruising for unknown reasons. ? A serious fall or if you hit your head (even if there is no bleeding).  Some foods or medicines interact with Coumadin (warfarin) and might alter your response to warfarin. To help avoid this: ? Eat a balanced diet, maintaining a consistent amount of Vitamin K. ? Notify your provider about major diet changes you plan to make. ? Avoid alcohol or limit your intake to 1 drink for women and 2 drinks for men per day. (1 drink is 5 oz. wine, 12 oz. beer, or 1.5 oz. liquor.)  Make sure that ANY health care provider who prescribes medication for you knows that you are taking Coumadin (warfarin).  Also make sure the healthcare provider who is monitoring your Coumadin knows when you have started a new medication including herbals and non-prescription products.  Coumadin (Warfarin)  Major Drug Interactions  Increased Warfarin Effect Decreased Warfarin  Effect  Alcohol (large quantities) Antibiotics (esp. Septra/Bactrim, Flagyl, Cipro) Amiodarone (Cordarone) Aspirin (ASA) Cimetidine (Tagamet) Megestrol (Megace) NSAIDs (ibuprofen, naproxen, etc.) Piroxicam (Feldene) Propafenone (Rythmol SR) Propranolol (Inderal) Isoniazid (INH) Posaconazole (Noxafil) Barbiturates (Phenobarbital) Carbamazepine (Tegretol) Chlordiazepoxide (Librium) Cholestyramine (Questran) Griseofulvin Oral Contraceptives Rifampin Sucralfate (Carafate) Vitamin K    Coumadin (Warfarin) Major Herbal Interactions  Increased Warfarin Effect Decreased Warfarin Effect  Garlic Ginseng Ginkgo biloba Coenzyme Q10 Green tea St. Johns wort    Coumadin (Warfarin) FOOD Interactions  Eat a consistent number of servings per week of foods HIGH in Vitamin K (1 serving =  cup)  Collards (cooked, or boiled & drained) Kale (cooked, or boiled & drained) Mustard greens (cooked, or boiled & drained) Parsley *serving size only =  cup Spinach (cooked, or boiled & drained) Swiss chard (cooked, or boiled & drained) Turnip greens (cooked, or boiled & drained)  Eat a consistent number of servings per week of foods MEDIUM-HIGH in Vitamin K (1 serving = 1 cup)  Asparagus (cooked, or boiled & drained) Broccoli (cooked, boiled & drained, or raw & chopped) Brussel sprouts (cooked, or boiled & drained) *serving size only =  cup Lettuce, raw (green leaf, endive, romaine) Spinach, raw Turnip greens, raw & chopped   These websites have more information on Coumadin (warfarin):  FailFactory.se; VeganReport.com.au;

## 2018-03-25 NOTE — Evaluation (Signed)
Occupational Therapy Evaluation Patient Details Name: Kylie Sanchez MRN: [id number] DOB: 1925/08/03 Today's Date: 03/25/2018    History of Present Illness Kylie Sanchez is a 84 y.o. female with a known history of CKD, HTN, HLD, Afib on Coumadin, DM presented to the emergency department for evaluation of right hip pain s/p mechanical fall.  Patient was in a usual state of health until the evening of fall when she got was not using her walker and fell landing on hardwood floors.  Pt sustained R hip fx s/p IM nail. Pt also with acute UTI.    Clinical Impression   This 84 y/o female presents with the above. At baseline pt was completing limited functional mobility using RW, reports completing ADLs with mod independence, was receiving assist for iADL completion. Pt presenting with significant pain this session, requiring MaxA+2 for bed mobility; requiring up to maxA for static sitting EOB, though intermittently able to maintain with minguard, pt sitting EOB >10 min during session. Pt currently requiring ModA for UB ADLs, Max-totalA for LB ADLs. Pt will benefit from continued acute OT services and recommend SNF level OT services after discharge to maximize her overall safety and independence with ADLs and mobility prior to return home.     Follow Up Recommendations  SNF;Supervision/Assistance - 24 hour    Equipment Recommendations  Other (comment)(TBD in next venue )           Precautions / Restrictions Precautions Precautions: Fall Precaution Comments: no L eye Restrictions Weight Bearing Restrictions: Yes RLE Weight Bearing: Weight bearing as tolerated      Mobility Bed Mobility Overal bed mobility: Needs Assistance Bed Mobility: Supine to Sit;Sit to Supine     Supine to sit: Max assist;+2 for physical assistance;+2 for safety/equipment;HOB elevated Sit to supine: Total assist;+2 for physical assistance;+2 for safety/equipment   General bed mobility comments: Assist to advance LEs and  scoot hips towards EOB with assist provided to bring trunk upright into sitting, pt requiring significant time and rest breaks during transfer due to pain and pt anxious about mobility; totalA+2 with assist to guide trunk and LEs onto bed when returning to supine   Transfers                      Balance Overall balance assessment: Needs assistance;History of Falls Sitting-balance support: Bilateral upper extremity supported Sitting balance-Leahy Scale: Poor Sitting balance - Comments: pt requiring up to [place] to maintain static sitting balance, intermittently able to maintain with close minguard Postural control: Posterior lean;Left lateral lean                                 ADL either performed or assessed with clinical judgement   ADL Overall ADL's : Needs assistance/impaired Eating/Feeding: Set up;Minimal assistance;Sitting   Grooming: Set up;Min guard;Sitting Grooming Details (indicate cue type and reason): supported sitting  Upper Body Bathing: Moderate assistance;Sitting Upper Body Bathing Details (indicate cue type and reason): pt requiring up to [facility] for sitting balance EOB; though at times able to maintain with minguard  Lower Body Bathing: Maximal assistance;Bed level;Sitting/lateral leans;+2 for physical assistance;+2 for safety/equipment   Upper Body Dressing : Moderate assistance;Sitting   Lower Body Dressing: Total assistance;Bed level;+2 for physical assistance       Toileting- Clothing Manipulation and Hygiene: Total assistance;Bed level;+2 for physical assistance;+2 for safety/equipment       Functional mobility during ADLs: Maximal assistance;Total assistance;+2  for physical assistance;+2 for safety/equipment(for bed mobility only ) General ADL Comments: pt requires significant time to reach EOB due to pain with MaxA+2; pt sat EOB >10 min, assist for static sitting varying between modA-minguard due to pt's discomfort in RLE; pt requiring  max-totalA for ADLs at this time                          Pertinent Vitals/Pain Pain Assessment: Faces Faces Pain Scale: Hurts worst Pain Location: R hip with movement Pain Descriptors / Indicators: Operative site guarding;Aching Pain Intervention(s): Monitored during session;Limited activity within patient's tolerance;Repositioned     Hand Dominance Right   Extremity/Trunk Assessment Upper Extremity Assessment Upper Extremity Assessment: Generalized weakness   Lower Extremity Assessment Lower Extremity Assessment: Defer to PT evaluation   Cervical / Trunk Assessment Cervical / Trunk Assessment: Kyphotic   Communication Communication Communication: HOH   Cognition Arousal/Alertness: Awake/alert Behavior During Therapy: Anxious                                   General Comments: pt appears oriented though at times demonstrates slow processing and increased confusion, suspect this may partly be due to pt focusing on pain    General Comments                  Home Living Family/patient expects to be discharged to:: Skilled nursing facility Living Arrangements: Other relatives Available Help at Discharge: Available PRN/intermittently;Family Type of Home: House Home Access: Ramped entrance     Home Layout: One level               Home Equipment: [place] - 2 wheels;Shower seat   Additional Comments: lives with her brother      Prior Functioning/Environment Level of Independence: Needs assistance  Gait / Transfers Assistance Needed: uses RW limited household ambulation ADL's / Homemaking Assistance Needed: independent with ADLS, assist with iADLs            OT Problem List: Decreased strength;Impaired balance (sitting and/or standing);Pain;Decreased activity tolerance      OT Treatment/Interventions: Self-care/ADL training;DME and/or AE instruction;Therapeutic activities;Balance training;Therapeutic exercise;Patient/family  education    OT Goals(Current goals can be found in the care plan section) Acute Rehab OT Goals Patient Stated Goal: less pain OT Goal Formulation: With patient Time For Goal Achievement: 04/08/18 Potential to Achieve Goals: Good  OT Frequency: Min 2X/week               Co-evaluation PT/OT/SLP Co-Evaluation/Treatment: Yes Reason for Co-Treatment: Complexity of the patient's impairments (multi-system involvement);For patient/therapist safety;To address functional/ADL transfers   OT goals addressed during session: ADL's and self-care;Strengthening/ROM      AM-PAC PT "6 Clicks" Daily Activity     Outcome Measure Help from another person eating meals?: A Little Help from another person taking care of personal grooming?: A Little Help from another person toileting, which includes using toliet, bedpan, or urinal?: Total Help from another person bathing (including washing, rinsing, drying)?: A Lot Help from another person to put on and taking off regular upper body clothing?: A Lot Help from another person to put on and taking off regular lower body clothing?: Total 6 Click Score: 12   End of Session Equipment Utilized During Treatment: Oxygen Nurse Communication: Mobility status;Other (comment)(IV beeping)  Activity Tolerance: Patient tolerated treatment well Patient left: in bed;with call bell/phone within reach;with bed alarm set;with  family/visitor present  OT Visit Diagnosis: History of falling (Z91.81);Pain;Muscle weakness (generalized) (M62.81) Pain - Right/Left: Right Pain - part of body: Hip                Time: 2158-7276 OT Time Calculation (min): 39 min Charges:  OT Evaluation $OT Eval Moderate Complexity: 1 Mod G-Codes:     Lou Cal, OT Pager [phone] 03/25/2018   Raymondo Band 03/25/2018, 3:27 PM

## 2018-03-25 NOTE — Progress Notes (Signed)
ANTICOAGULATION CONSULT NOTE - Follow Up Consult  Pharmacy Consult:  Coumadin Indication: atrial fibrillation  Allergies  Allergen Reactions  . No Known Allergies     Patient Measurements: Height: 5\' 2"  (157.5 cm) Weight: (Unable to get weight due to bed scale broken ) IBW/kg (Calculated) : 50.1  Vital Signs: Temp: 97.7 F (36.5 C) (05/15 1135) Temp Source: Oral (05/15 1135) BP: 113/68 (05/15 1135) Pulse Rate: 95 (05/15 1135)  Labs: Recent Labs    03/22/18 2119 03/23/18 0229 03/24/18 0306 03/25/18 0355  HGB 10.5* 9.5* 8.1* 6.8*  HCT 31.7* 28.9* 24.9* 22.1*  PLT 157 145* 135* 148*  LABPROT  --  15.4* 15.1 15.8*  INR  --  1.23 1.20 1.27  CREATININE 1.51* 1.46* 1.30* 1.41*  TROPONINI 0.05*  --   --   --     Estimated Creatinine Clearance: 23.2 mL/min (A) (by C-G formula based on SCr of 1.41 mg/dL (H)).   Assessment: 81 YOF resuming Coumadin from PTA for PAF following surgery for a hip fracture. Given vitamin K 10 mg on 5/12.  INR sub-therapeutic as expected.  Hemoglobin is low but no bleeding documented.  PTA regimen: 4 mg daily exc 2 mg on Tue/Sat   Goal of Therapy:  INR 2-3 Monitor platelets by anticoagulation protocol: Yes    Plan:  Repeat Coumadin 6mg  PO today Daily PT / INR  F/u abx LOT   Amandalynn Pitz D. Mina Marble, PharmD, BCPS, [place] Pager:  [phone] 03/25/2018, 12:10 PM

## 2018-03-25 NOTE — Progress Notes (Signed)
PROGRESS NOTE  Kylie Sanchez WIO:[id number] DOB: 1925-02-09 DOA: 03/21/2018 PCP: Unk Pinto, MD   LOS: 3 days   Brief Narrative / Interim history: 84 year old female with chronic kidney disease, hypertension, hyperlipidemia, A. fib on chronic Coumadin, diabetes mellitus, who was admitted to the hospital on 5/11 after having a fall at home and was found to have hip fracture.  She underwent operative repair on 5/14.  On admission she was hypotensive and anemic, received IV fluids as well as packed red blood cells, along with FFP due to the fact that she was on Coumadin  Assessment & Plan: Principal Problem:   Closed right hip fracture, initial encounter [facility]) Active Problems:   Hyperlipidemia   Persistent atrial fibrillation ([place])   Type 2 diabetes mellitus with hyperlipidemia ([place])   Hip fracture ([place])   Acute lower UTI   Closed right intertrochanteric fracture -Orthopedic surgery consulted, appreciate input, she is status post IM nailing by Dr. Ninfa Linden on 5/14  -Likely needs SNF, discussed with social worker  Acute blood loss anemia -Postop, hemoglobin less than 7 this morning and will transfuse 1 unit of packed red blood cells.  No evidence of bleed.  Hypotension -On presentation, improved -Continue metoprolol for her hypertension and rate control  Permanent A. 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 -baseline Cr 1.3-1.5, currently at baseline  Depression -Continue Lexapro  Hyperlipidemia -Continue Lipitor  Urinary tract infection -She speciated Klebsiella, she is on ceftriaxone started on 5/12, plan for 5 days, today day 4/5   DVT prophylaxis: Coumadin Code Status: DNR Family Communication: no family at bedside Disposition Plan: SNF 1-2 days pending stable Hb and Cr  Consultants:    Orthopedic surgery   Procedures:  Open reduction and internal fixation of right subtrochanteric femur fracture by intramedullary rod and hip screw on 03/23/2018  Antimicrobials:  Ceftriaxone 5/12 >>    Subjective: - no chest pain, shortness of breath, no abdominal pain, nausea or vomiting.   Objective: Vitals:   03/24/18 2034 03/25/18 0430 03/25/18 0442 03/25/18 1135  BP: (!) 77/46 (!) 79/54 (!) 90/51 113/68  Pulse: 87 99 69 95  Resp:  16    Temp: 98 F (36.7 C) 98 F (36.7 C)  97.7 F (36.5 C)  TempSrc: Oral Oral  Oral  SpO2: 95% 95%  100%  Weight:      Height:        Intake/Output Summary (Last 24 hours) at 03/25/2018 1200 Last data filed at 03/25/2018 0900 Gross per 24 hour  Intake 1060 ml  Output -  Net 1060 ml   Filed Weights   03/21/18 2350  Weight: 63.5 kg (140 lb)    Examination:  Constitutional: NAD, pale appearing Eyes: lids and conjunctivae normal ENMT: Mucous membranes are moist.  Respiratory: clear to auscultation bilaterally, no wheezing, no crackles.  Cardiovascular: irregular, no murmurs / rubs / gallops. No LE edema. Abdomen: no tenderness. Bowel sounds positive.  Skin: no rashes Neurologic: non focal, strength 5/5 in all 4    Data Reviewed: I have independently reviewed following labs and imaging studies   CBC: Recent Labs  Lab 03/22/18 0007  03/22/18 1335 03/22/18 2119 03/23/18 0229 03/24/18 0306 03/25/18 0355  WBC 17.7*   < > 12.1* 10.5 9.0 6.8 8.4  NEUTROABS 14.6*  --   --   --  6.9 6.0  --  HGB 11.0*   < > 7.9* 10.5* 9.5* 8.1* 6.8*  HCT 33.8*   < > 24.9* 31.7* 28.9* 24.9* 22.1*  MCV 92.6   < > 94.0 89.3 88.9 93.3 95.7  PLT 278   < > 239 157 145* 135* 148*   < > = values in this interval not displayed.   Basic Metabolic Panel: Recent Labs  Lab 03/22/18 0430 03/22/18 2119 03/23/18 0229 03/24/18 0306 03/25/18 0355  NA 140 143 143 143 141  K 5.2* 5.0 4.9 4.9 4.5  CL 109 111 112* 111 113*  CO2 21* 25 25 20* 20*   GLUCOSE 231* 174* 194* 269* 179*  BUN 44* 40* 40* 32* 35*  CREATININE 1.49* 1.51* 1.46* 1.30* 1.41*  CALCIUM 8.5* 8.4* 8.2* 8.3* 8.1*   GFR: Estimated Creatinine Clearance: 23.2 mL/min (A) (by C-G formula based on SCr of 1.41 mg/dL (H)). Liver Function Tests: Recent Labs  Lab 03/22/18 0007 03/23/18 0229  AST 25 21  ALT 26 20  ALKPHOS 76 48  BILITOT 0.4 0.5  PROT 6.3* 5.4*  ALBUMIN 3.2* 2.7*   No results for input(s): LIPASE, AMYLASE in the last 168 hours. No results for input(s): AMMONIA in the last 168 hours. Coagulation Profile: Recent Labs  Lab 03/22/18 0430 03/22/18 1335 03/23/18 0229 03/24/18 0306 03/25/18 0355  INR 3.17 1.57 1.23 1.20 1.27   Cardiac Enzymes: Recent Labs  Lab 03/22/18 2119  TROPONINI 0.05*   BNP (last 3 results) No results for input(s): PROBNP in the last 8760 hours. HbA1C: No results for input(s): HGBA1C in the last 72 hours. CBG: Recent Labs  Lab 03/24/18 1157 03/24/18 1558 03/24/18 2111 03/25/18 0630 03/25/18 1131  GLUCAP 200* 152* 179* 153* 172*   Lipid Profile: No results for input(s): CHOL, HDL, LDLCALC, TRIG, CHOLHDL, LDLDIRECT in the last 72 hours. Thyroid Function Tests: No results for input(s): TSH, T4TOTAL, FREET4, T3FREE, THYROIDAB in the last 72 hours. Anemia Panel: Recent Labs    03/23/18 0934  VITAMINB12 402  FOLATE 28.6  FERRITIN 70  TIBC 251  IRON 27*  RETICCTPCT 1.8   Urine analysis:    Component Value Date/Time   COLORURINE YELLOW 03/22/2018 0221   APPEARANCEUR HAZY (A) 03/22/2018 0221   LABSPEC 1.014 03/22/2018 0221   PHURINE 7.0 03/22/2018 0221   GLUCOSEU 50 (A) 03/22/2018 0221   HGBUR NEGATIVE 03/22/2018 0221   BILIRUBINUR NEGATIVE 03/22/2018 0221   KETONESUR NEGATIVE 03/22/2018 0221   PROTEINUR NEGATIVE 03/22/2018 0221   UROBILINOGEN 0.2 02/10/2015 1034   NITRITE POSITIVE (A) 03/22/2018 0221   LEUKOCYTESUR TRACE (A) 03/22/2018 0221   Sepsis Labs: Invalid input(s): PROCALCITONIN,  LACTICIDVEN  Recent Results (from the past 240 hour(s))  Urine culture     Status: Abnormal   Collection Time: 03/22/18  4:30 AM  Result Value Ref Range Status   Specimen Description   Final    URINE, CATHETERIZED Performed at [facility], [address] [place], [place] [address]    Special Requests NONE  Final   Culture >=100,000 COLONIES/mL KLEBSIELLA PNEUMONIAE (A)  Final   Report Status 03/24/2018 FINAL  Final   Organism ID, Bacteria KLEBSIELLA PNEUMONIAE (A)  Final      Susceptibility   Klebsiella pneumoniae - MIC*    AMPICILLIN RESISTANT Resistant     CEFAZOLIN <=4 SENSITIVE Sensitive     CEFTRIAXONE <=1 SENSITIVE Sensitive     CIPROFLOXACIN <=0.25 SENSITIVE Sensitive     GENTAMICIN <=1 SENSITIVE Sensitive  IMIPENEM <=0.25 SENSITIVE Sensitive     NITROFURANTOIN 64 INTERMEDIATE Intermediate     TRIMETH/SULFA <=20 SENSITIVE Sensitive     AMPICILLIN/SULBACTAM 4 SENSITIVE Sensitive     PIP/TAZO <=4 SENSITIVE Sensitive     Extended ESBL NEGATIVE Sensitive     * >=100,000 COLONIES/mL KLEBSIELLA PNEUMONIAE  MRSA PCR Screening     Status: Abnormal   Collection Time: 03/23/18  2:57 AM  Result Value Ref Range Status   MRSA by PCR POSITIVE (A) NEGATIVE Final    Comment:        The GeneXpert MRSA Assay (FDA approved for NASAL specimens only), is one component of a comprehensive MRSA colonization surveillance program. It is not intended to diagnose MRSA infection nor to guide or monitor treatment for MRSA infections. RESULT CALLED TO, READ BACK BY AND VERIFIED WITH: H. Laurence Compton [id number] 03/23/2018 Mena Goes Performed at [facility], [place] [address], [place], [place] [address]       Radiology Studies: Dg C-arm 1-60 Min  Result Date: 03/23/2018 CLINICAL DATA:  Right hip fracture EXAM: DG C-ARM 61-120 MIN; RIGHT FEMUR 2 VIEWS COMPARISON:  03/22/2018 FINDINGS: Total fluoroscopy time was 3 minutes 24 seconds. Six low resolution intraoperative spot views of the  right hip. Images demonstrate intramedullary rod and screw fixation of the right femur for intertrochanteric fracture. IMPRESSION: Intraoperative fluoroscopic assistance provided during right hip surgery Electronically Signed   By: Donavan Foil M.D.   On: 03/23/2018 19:18   Dg Femur, Min 2 Views Right  Result Date: 03/23/2018 CLINICAL DATA:  Right hip fracture EXAM: DG C-ARM 61-120 MIN; RIGHT FEMUR 2 VIEWS COMPARISON:  03/22/2018 FINDINGS: Total fluoroscopy time was 3 minutes 24 seconds. Six low resolution intraoperative spot views of the right hip. Images demonstrate intramedullary rod and screw fixation of the right femur for intertrochanteric fracture. IMPRESSION: Intraoperative fluoroscopic assistance provided during right hip surgery Electronically Signed   By: Donavan Foil M.D.   On: 03/23/2018 19:18     Scheduled Meds: . atorvastatin  40 mg Oral q1800  . B-complex with vitamin C  1 tablet Oral Daily  . docusate sodium  100 mg Oral BID  . escitalopram  10 mg Oral Daily  . glimepiride  2 mg Oral Q breakfast  . insulin aspart  0-15 Units Subcutaneous TID WC  . metoprolol succinate  100 mg Oral Daily  . mupirocin ointment   Nasal BID  . Warfarin - Pharmacist Dosing Inpatient   Does not apply q1800   Continuous Infusions: . sodium chloride 100 mL/hr at 03/24/18 2210  . cefTRIAXone (ROCEPHIN)  IV Stopped (03/25/18 0345)  . lactated ringers      Marzetta Board, MD, PhD [facility] Pager [phone]  If 7PM-7AM, please contact night-coverage [email or web address] Password [email or web address] 03/25/2018, 12:00 PM

## 2018-03-25 NOTE — NC FL2 (Signed)
[place] LEVEL OF CARE SCREENING TOOL     IDENTIFICATION  Patient Name: Kylie Sanchez Birthdate: 1925/04/09 Sex: female Admission Date (Current Location): 03/21/2018  [facility] and [place] Number:  Herbalist and Address:  [facility], [place] [address] [place], [place] [address]      Provider Number: [id number]  Attending Physician Name and Address:  Caren Griffins, MD  Relative Name and Phone Number:  Khai Torbert, brother, [phone]    Current Level of Care: Hospital Recommended Level of Care: [place] Prior Approval Number:    Date Approved/Denied:   PASRR Number: [id number]  Discharge Plan: SNF    Current Diagnoses: Patient Active Problem List   Diagnosis Date Noted  . Acute lower UTI 03/24/2018  . Closed right hip fracture, initial encounter ([place]) 03/22/2018  . Hip fracture ([place]) 03/22/2018  . Closed fracture of right hip ([place])   . Warfarin-induced coagulopathy ([place])   . Mild cognitive impairment 01/22/2018  . Afib ([place]) 11/17/2017  . Depression, major, recurrent, in partial remission ([place]) 11/17/2017  . Palliative care encounter   . Goals of care, counseling/discussion   . Pressure injury of skin 05/22/2017  . Dilated cardiomyopathy ([place]) 05/20/2017  . Elevated troponin- unclear etiology 05/19/2017  . Asymptomatic stenosis of right carotid artery 05/13/2017  . H/O enucleation of left eyeball 10/30/2015  . Macular degeneration, right eye 10/30/2015  . Pseudophakia, right eye 10/30/2015  . Basal cell carcinoma 02/10/2015  . DM neuropathy, type II diabetes mellitus ([place]) 02/10/2015  . Persistent atrial fibrillation ([place]) 02/03/2015  . Type 2 diabetes mellitus with hyperlipidemia ([place]) 11/24/2014  . T2_NIDDM w/Stage 3 CKD (GFR 40 ml/min) 11/08/2014  . Vitamin D deficiency 11/08/2014  . Medication management 11/08/2014  . BP (high blood pressure) 11/08/2014  . Hyperlipidemia   . Sick sinus  syndrome ([place])   . Retinopathy   . Long term current use of anticoagulant therapy 04/30/2012  . Tachycardia-bradycardia ([place]) 04/19/2012  . HCD (hypertensive cardiovascular disease) 04/18/2011    Orientation RESPIRATION BLADDER Height & Weight     Self  Normal Continent, Indwelling catheter Weight: (Unable to get weight due to bed scale broken ) Height:  5\' 2"  (157.5 cm)  BEHAVIORAL SYMPTOMS/MOOD NEUROLOGICAL BOWEL NUTRITION STATUS  (Confused)   Continent Diet(See DC summary)  AMBULATORY STATUS COMMUNICATION OF NEEDS Skin   Extensive Assist Verbally Surgical wounds(RIght Hip with silver hydrofiber dressing)                       Personal Care Assistance Level of Assistance  Bathing, Feeding, Dressing Bathing Assistance: Maximum assistance Feeding assistance: Limited assistance Dressing Assistance: Maximum assistance     Functional Limitations Info  Sight, Hearing, Speech Sight Info: Adequate Hearing Info: Adequate Speech Info: Adequate    SPECIAL CARE FACTORS FREQUENCY  PT (By licensed PT), OT (By licensed OT)     PT Frequency: 5x week OT Frequency: 5x week            Contractures      Additional Factors Info  Psychotropic Code Status Info: DNR Allergies Info: No Known Allergies Psychotropic Info: Lexapro 10mg  daily Insulin Sliding Scale Info: 0-15 units 3x/day with meals Isolation Precautions Info: MRSA     Current Medications (03/25/2018):  This is the current hospital active medication list Current Facility-Administered Medications  Medication Dose Route Frequency Provider Last Rate Last Dose  . 0.9 %  sodium chloride infusion  Intravenous Continuous Gardiner Barefoot, NP 100 mL/hr at 03/24/18 2210    . acetaminophen (TYLENOL) tablet 325-650 mg  325-650 mg Oral Q6H PRN Mcarthur Rossetti, MD      . atorvastatin (LIPITOR) tablet 40 mg  40 mg Oral q1800 Mcarthur Rossetti, MD   40 mg at 03/24/18 1745  . B-complex with vitamin C tablet 1  tablet  1 tablet Oral Daily Mcarthur Rossetti, MD   1 tablet at 03/24/18 0930  . cefTRIAXone (ROCEPHIN) 1 g in sodium chloride 0.9 % 100 mL IVPB  1 g Intravenous Q24H Mcarthur Rossetti, MD   Stopped at 03/25/18 0345  . cyclobenzaprine (FLEXERIL) tablet 5 mg  5 mg Oral BID PRN Mcarthur Rossetti, MD   5 mg at 03/25/18 0004  . docusate sodium (COLACE) capsule 100 mg  100 mg Oral BID Mcarthur Rossetti, MD   100 mg at 03/24/18 0930  . escitalopram (LEXAPRO) tablet 10 mg  10 mg Oral Daily Gherghe, Costin M, MD      . glimepiride (AMARYL) tablet 2 mg  2 mg Oral Q breakfast Pokhrel, Laxman, MD      . HYDROcodone-acetaminophen (NORCO) 7.5-325 MG per tablet 1-2 tablet  1-2 tablet Oral Q4H PRN Mcarthur Rossetti, MD      . HYDROcodone-acetaminophen (NORCO/VICODIN) 5-325 MG per tablet 1 tablet  1 tablet Oral Q6H PRN Mcarthur Rossetti, MD   1 tablet at 03/24/18 1745  . HYDROcodone-acetaminophen (NORCO/VICODIN) 5-325 MG per tablet 1-2 tablet  1-2 tablet Oral Q4H PRN Mcarthur Rossetti, MD   1 tablet at 03/25/18 0005  . insulin aspart (novoLOG) injection 0-15 Units  0-15 Units Subcutaneous TID WC Mcarthur Rossetti, MD   3 Units at 03/24/18 1640  . lactated ringers infusion   Intravenous Continuous Mcarthur Rossetti, MD      . menthol-cetylpyridinium (CEPACOL) lozenge 3 mg  1 lozenge Oral PRN Mcarthur Rossetti, MD       Or  . phenol (CHLORASEPTIC) mouth spray 1 spray  1 spray Mouth/Throat PRN Mcarthur Rossetti, MD      . metoCLOPramide (REGLAN) tablet 5-10 mg  5-10 mg Oral Q8H PRN Mcarthur Rossetti, MD       Or  . metoCLOPramide (REGLAN) injection 5-10 mg  5-10 mg Intravenous Q8H PRN Mcarthur Rossetti, MD      . metoprolol succinate (TOPROL-XL) 24 hr tablet 100 mg  100 mg Oral Daily Pokhrel, Laxman, MD   100 mg at 03/24/18 1524  . morphine 2 MG/ML injection 0.5-1 mg  0.5-1 mg Intravenous Q2H PRN Mcarthur Rossetti, MD      . morphine  4 MG/ML injection 1 mg  1 mg Intravenous Q4H PRN Mcarthur Rossetti, MD   1 mg at 03/23/18 1236  . mupirocin ointment (BACTROBAN) 2 %   Nasal BID Mcarthur Rossetti, MD      . nitroGLYCERIN (NITROSTAT) SL tablet 0.4 mg  0.4 mg Sublingual Q5 min PRN Mcarthur Rossetti, MD      . ondansetron [facility]) tablet 4 mg  4 mg Oral Q6H PRN Mcarthur Rossetti, MD       Or  . ondansetron [facility]) injection 4 mg  4 mg Intravenous Q6H PRN Mcarthur Rossetti, MD      . sodium chloride (OCEAN) 0.65 % nasal spray 1 spray  1 spray Each Nare PRN Mcarthur Rossetti, MD      . Warfarin - Pharmacist Dosing  Inpatient   Does not apply q1800 Georgette Shell, MD         Discharge Medications: Please see discharge summary for a list of discharge medications.  Relevant Imaging Results:  Relevant Lab Results:   Additional Information SS#: [place], [place]

## 2018-03-25 NOTE — Progress Notes (Signed)
Physical Therapy Treatment Patient Details Name: Kylie Sanchez MRN: [id number] DOB: [birth year] Today's Date: 03/25/2018    History of Present Illness Kylie Sanchez is a 84 y.o. female with a known history of CKD, HTN, HLD, Afib on Coumadin, DM presented to the emergency department for evaluation of right hip pain s/p mechanical fall.  Patient was in a usual state of health until the evening of fall when she got was not using her walker and fell landing on hardwood floors.  Pt sustained R hip fx s/p IM nail. Pt also with acute UTI.     PT Comments    Pt making slow progress and remains limited secondary to pain and anxiety with movement. Pt continues to require heavy two person physical assistance for functional mobility. Pt would continue to benefit from skilled physical therapy services at this time while admitted and after d/c to address the below listed limitations in order to improve overall safety and independence with functional mobility.    Follow Up Recommendations  SNF;Supervision/Assistance - 24 hour     Equipment Recommendations  None recommended by PT    Recommendations for Other Services       Precautions / Restrictions Precautions Precautions: Fall Precaution Comments: no L eye Restrictions Weight Bearing Restrictions: Yes RLE Weight Bearing: Weight bearing as tolerated    Mobility  Bed Mobility Overal bed mobility: Needs Assistance Bed Mobility: Supine to Sit;Sit to Supine     Supine to sit: Max assist;+2 for physical assistance;+2 for safety/equipment;HOB elevated Sit to supine: Total assist;+2 for physical assistance;+2 for safety/equipment   General bed mobility comments: increased time and effort, cueing for sequencing, heavy physical assistance of two for all aspects; pt frequent rest breaks secondary to pain and anxiety  Transfers                 General transfer comment: unable  Ambulation/Gait                 Stairs              Wheelchair Mobility    Modified Rankin (Stroke Patients Only)       Balance Overall balance assessment: Needs assistance;History of Falls Sitting-balance support: Bilateral upper extremity supported Sitting balance-Leahy Scale: Poor Sitting balance - Comments: pt requiring up to [place] to maintain static sitting balance, intermittently able to maintain with close minguard Postural control: Posterior lean;Left lateral lean                                  Cognition Arousal/Alertness: Awake/alert Behavior During Therapy: Anxious Overall Cognitive Status: Impaired/Different from baseline Area of Impairment: Following commands;Safety/judgement;Problem solving                       Following Commands: Follows one step commands consistently Safety/Judgement: Decreased awareness of safety;Decreased awareness of deficits   Problem Solving: Slow processing;Difficulty sequencing;Requires verbal cues General Comments: pt appears oriented though at times demonstrates slow processing and increased confusion, suspect this may partly be due to pt focusing on pain       Exercises General Exercises - Lower Extremity Ankle Circles/Pumps: AROM;Both;20 reps;Supine;Seated Long Arc Quad: AAROM;Right;10 reps;Seated Heel Raises: AROM;Right;10 reps;Seated    General Comments        Pertinent Vitals/Pain Pain Assessment: Faces Faces Pain Scale: Hurts whole lot Pain Location: R hip with movement Pain Descriptors / Indicators: Operative site guarding;Aching Pain Intervention(s):  Monitored during session;Repositioned    Home Living Family/patient expects to be discharged to:: Skilled nursing facility Living Arrangements: Other relatives Available Help at Discharge: Available PRN/intermittently;Family Type of Home: House Home Access: Ramped entrance   Home Layout: One level Home Equipment: [place] - 2 wheels;Shower seat Additional Comments: lives with her brother     Prior Function Level of Independence: Needs assistance  Gait / Transfers Assistance Needed: uses RW limited household ambulation ADL's / Homemaking Assistance Needed: independent with ADLS, assist with iADLs     PT Goals (current goals can now be found in the care plan section) Acute Rehab PT Goals Patient Stated Goal: less pain PT Goal Formulation: With patient Time For Goal Achievement: 04/07/18 Potential to Achieve Goals: Fair Progress towards PT goals: Progressing toward goals    Frequency    Min 2X/week      PT Plan Frequency needs to be updated    Co-evaluation PT/OT/SLP Co-Evaluation/Treatment: Yes Reason for Co-Treatment: Complexity of the patient's impairments (multi-system involvement);To address functional/ADL transfers;For patient/therapist safety PT goals addressed during session: Mobility/safety with mobility;Balance;Strengthening/ROM OT goals addressed during session: ADL's and self-care;Strengthening/ROM      AM-PAC PT "6 Clicks" Daily Activity  Outcome Measure  Difficulty turning over in bed (including adjusting bedclothes, sheets and blankets)?: Unable Difficulty moving from lying on back to sitting on the side of the bed? : Unable Difficulty sitting down on and standing up from a chair with arms (e.g., wheelchair, bedside commode, etc,.)?: Unable Help needed moving to and from a bed to chair (including a wheelchair)?: Total Help needed walking in hospital room?: Total Help needed climbing 3-5 steps with a railing? : Total 6 Click Score: 6    End of Session   Activity Tolerance: Patient limited by pain;Other (comment)(pt limited secondary to anxiety) Patient left: in bed;with call bell/phone within reach;with bed alarm set;with family/visitor present Nurse Communication: Mobility status PT Visit Diagnosis: Muscle weakness (generalized) (M62.81);Pain;Other abnormalities of gait and mobility (R26.89) Pain - Right/Left: Right Pain - part of body: Hip      Time: 8676-7209 PT Time Calculation (min) (ACUTE ONLY): 36 min  Charges:  $Therapeutic Activity: 8-22 mins                    G Codes:       [place], [place], [place] 03/25/2018, 4:57 PM

## 2018-03-26 LAB — URINALYSIS, MICROSCOPIC (REFLEX): RBC / HPF: NONE SEEN RBC/hpf (ref 0–5)

## 2018-03-26 LAB — BASIC METABOLIC PANEL
Anion gap: 9 (ref 5–15)
BUN: 42 mg/dL — ABNORMAL HIGH (ref 6–20)
CALCIUM: 8.3 mg/dL — AB (ref 8.9–10.3)
CO2: 21 mmol/L — ABNORMAL LOW (ref 22–32)
Chloride: 107 mmol/L (ref 101–111)
Creatinine, Ser: 1.62 mg/dL — ABNORMAL HIGH (ref 0.44–1.00)
GFR, EST AFRICAN AMERICAN: 31 mL/min — AB (ref >60)
GFR, EST NON AFRICAN AMERICAN: 27 mL/min — AB (ref >60)
Glucose, Bld: 90 mg/dL (ref 65–99)
Potassium: 4.1 mmol/L (ref 3.5–5.1)
Sodium: 137 mmol/L (ref 135–145)

## 2018-03-26 LAB — TYPE AND SCREEN
ABO/RH(D): O POS
ANTIBODY SCREEN: NEGATIVE
UNIT DIVISION: 0
Unit division: 0
Unit division: 0

## 2018-03-26 LAB — URINALYSIS, ROUTINE W REFLEX MICROSCOPIC
Bilirubin Urine: NEGATIVE
Glucose, UA: NEGATIVE mg/dL
Hgb urine dipstick: NEGATIVE
Ketones, ur: 15 mg/dL — AB
NITRITE: NEGATIVE
PROTEIN: 30 mg/dL — AB
Specific Gravity, Urine: 1.025 (ref 1.005–1.030)
pH: 5.5 (ref 5.0–8.0)

## 2018-03-26 LAB — BPAM RBC
BLOOD PRODUCT EXPIRATION DATE: 201906052359
Blood Product Expiration Date: 201905292359
Blood Product Expiration Date: 201906012359
ISSUE DATE / TIME: 201905121624
ISSUE DATE / TIME: 201905151153
ISSUE DATE / TIME: 201905121455
UNIT TYPE AND RH: 5100
Unit Type and Rh: 5100
Unit Type and Rh: 5100

## 2018-03-26 LAB — GLUCOSE, CAPILLARY
GLUCOSE-CAPILLARY: 126 mg/dL — AB (ref 65–99)
GLUCOSE-CAPILLARY: 130 mg/dL — AB (ref 65–99)
Glucose-Capillary: 74 mg/dL (ref 65–99)
Glucose-Capillary: 114 mg/dL — ABNORMAL HIGH (ref 65–99)

## 2018-03-26 LAB — CBC
HEMATOCRIT: 27.5 % — AB (ref 36.0–46.0)
Hemoglobin: 8.7 g/dL — ABNORMAL LOW (ref 12.0–15.0)
MCH: 29.2 pg (ref 26.0–34.0)
MCHC: 31.6 g/dL (ref 30.0–36.0)
MCV: 92.3 fL (ref 78.0–100.0)
PLATELETS: 159 10*3/uL (ref 150–400)
RBC: 2.98 MIL/uL — ABNORMAL LOW (ref 3.87–5.11)
RDW: 16.7 % — AB (ref 11.5–15.5)
WBC: 7.8 10*3/uL (ref 4.0–10.5)

## 2018-03-26 LAB — PROTIME-INR
INR: 1.57
Prothrombin Time: 18.6 seconds — ABNORMAL HIGH (ref 11.4–15.2)

## 2018-03-26 MED ORDER — SODIUM CHLORIDE 0.9 % IV BOLUS
250.0000 mL | Freq: Once | INTRAVENOUS | Status: AC
  Administered 2018-03-26: 250 mL via INTRAVENOUS

## 2018-03-26 MED ORDER — SENNOSIDES-DOCUSATE SODIUM 8.6-50 MG PO TABS
2.0000 | ORAL_TABLET | Freq: Once | ORAL | Status: AC
  Administered 2018-03-26: 2 via ORAL
  Filled 2018-03-26: qty 2

## 2018-03-26 MED ORDER — CHLORHEXIDINE GLUCONATE CLOTH 2 % EX PADS
6.0000 | MEDICATED_PAD | Freq: Every day | CUTANEOUS | Status: DC
  Administered 2018-03-26: 6 via TOPICAL

## 2018-03-26 MED ORDER — METOPROLOL SUCCINATE ER 50 MG PO TB24
50.0000 mg | ORAL_TABLET | Freq: Every day | ORAL | Status: DC
  Administered 2018-03-26 – 2018-03-27 (×2): 50 mg via ORAL
  Filled 2018-03-26 (×2): qty 1

## 2018-03-26 MED ORDER — POLYETHYLENE GLYCOL 3350 17 G PO PACK
17.0000 g | PACK | Freq: Every day | ORAL | Status: DC
  Administered 2018-03-27: 17 g via ORAL
  Filled 2018-03-26: qty 1

## 2018-03-26 MED ORDER — SODIUM CHLORIDE 0.9 % IV BOLUS: 500.0000 mL | Freq: Once | INTRAVENOUS | Status: DC

## 2018-03-26 MED ORDER — WARFARIN SODIUM 4 MG PO TABS
4.0000 mg | ORAL_TABLET | Freq: Once | ORAL | Status: AC
  Administered 2018-03-26: 4 mg via ORAL
  Filled 2018-03-26: qty 1

## 2018-03-26 NOTE — Progress Notes (Signed)
ANTICOAGULATION CONSULT NOTE - Follow Up Consult  Pharmacy Consult: Coumadin Indication: atrial fibrillation  Allergies  Allergen Reactions  . No Known Allergies     Patient Measurements: Height: 5\' 2"  (157.5 cm) Weight: (Unable to get weight due to bed scale broken ) IBW/kg (Calculated) : 50.1  Vital Signs: Temp: 98.6 F (37 C) (05/16 0507) Temp Source: Oral (05/16 0507) BP: 131/78 (05/16 0855) Pulse Rate: 81 (05/16 0855)  Labs: Recent Labs    03/24/18 0306 03/25/18 0355 03/26/18 0656  HGB 8.1* 6.8* 8.7*  HCT 24.9* 22.1* 27.5*  PLT 135* 148* 159  LABPROT 15.1 15.8* 18.6*  INR 1.20 1.27 1.57  CREATININE 1.30* 1.41* 1.62*    Estimated Creatinine Clearance: 20.2 mL/min (A) (by C-G formula based on SCr of 1.62 mg/dL (H)).   Assessment: 59 YOF resuming Coumadin from PTA for PAF following surgery for a hip fracture. Given vitamin K 10 mg on 5/12.  INR sub-therapeutic at 1.57 as expected but trending up.  No bleeding noted, Hgb improved to 8.7, platelets are normal.  PTA regimen: 4 mg daily exc 2 mg on Tue/Sat  Goal of Therapy:  INR 2-3 Monitor platelets by anticoagulation protocol: Yes    Plan:  Coumadin 4 mg PO today Daily PT / INR Monitor for s/sx of bleeding  Consider stop ceftriaxone today   Renold Genta, PharmD, BCPS Clinical Pharmacist Clinical phone for 03/26/2018 until 3p is [phone] After 3p, please call Main Rx at [phone] for assistance 03/26/2018 10:00 AM

## 2018-03-26 NOTE — Progress Notes (Signed)
PROGRESS NOTE  Catrena Vari LKT:[id number] DOB: 10-18-1925 DOA: 03/21/2018 PCP: Unk Pinto, MD   LOS: 4 days   Brief Narrative / Interim history: 84 year old female with chronic kidney disease, hypertension, hyperlipidemia, A. fib on chronic Coumadin, diabetes mellitus, who was admitted to the hospital on 5/11 after having a fall at home and was found to have hip fracture.  She underwent operative repair on 5/14.  On admission she was hypotensive and anemic, received IV fluids as well as packed red blood cells, along with FFP due to the fact that she was on Coumadin   Assessment & Plan: Principal Problem:   Closed right hip fracture, initial encounter [facility]) Active Problems:   Hyperlipidemia   Persistent atrial fibrillation ([place])   Type 2 diabetes mellitus with hyperlipidemia ([place])   Hip fracture ([place])   Acute lower UTI   Closed right intertrochanteric fracture -Orthopedic surgery consulted, appreciate input, she is status post IM nailing by Dr. Ninfa Linden on 5/14  -Likely needs SNF, discussed with social worker  Acute blood loss anemia -Postop, hemoglobin less than 7 on 5/15, received a unit of packed red blood cells and improved appropriately today.  We will recheck a CBC in the morning  Hypotension -On presentation, improved -Continue metoprolol for her hypertension and rate control, will decrease the metoprolol dose today due to borderline low blood pressure  Permanent A. fib  -Rate controlled with metoprolol, continue telemetry, continue Coumadin per pharmacy  Type 2 diabetes mellitus, controlled, with renal complications -Controlled, A1c 6.7, glucose this morning at 90, will hold glipizide given worsening creatinine today  Chronic kidney disease stage III - IV -baseline Cr 1.3-1.5, creatinine slightly worse today at 1.6 probably in the setting of anemia as well as intermittent hypotension -Very small 250 cc bolus, she had urinary retention yesterday requiring in and out  catheterization, will continue to monitor with bladder scan and if she continues to retain will need to place a Foley catheter.  Discussed with RN  Depression -Continue Lexapro  Hyperlipidemia -Continue Lipitor  Urinary tract infection -She speciated Klebsiella, she is on ceftriaxone started on 5/12, plan for 5 days, today day 5/5   DVT prophylaxis: Coumadin Code Status: DNR Family Communication: no family at bedside Disposition Plan: SNF 1-2 days pending stable Hb and Cr  Consultants:   Orthopedic surgery   Procedures:  Open reduction and internal fixation of right subtrochanteric femur fracture by intramedullary rod and hip screw on 03/23/2018  Antimicrobials:  Ceftriaxone 5/12 >>    Subjective: -She feels somewhat sleepy this morning, she is concerned about being transferred to SNF and how she is going to do with ambulance ride and she feels like she will have more hip pain in the right would be too bumpy  Objective: Vitals:   03/25/18 1600 03/25/18 2004 03/26/18 0507 03/26/18 0855  BP: 118/66 (!) 82/48 (!) 90/52 131/78  Pulse: 87 86 83 81  Resp: 16 16 16    Temp: 97.7 F (36.5 C) 98 F (36.7 C) 98.6 F (37 C)   TempSrc: Oral Oral Oral   SpO2: 97% 95% 96% 99%  Weight:      Height:        Intake/Output Summary (Last 24 hours) at 03/26/2018 0959 Last data filed at 03/26/2018 0906 Gross per 24 hour  Intake 620 ml  Output 350 ml  Net 270 ml   Filed Weights   03/21/18 2350  Weight: 63.5 kg (140 lb)    Examination:  Constitutional: No distress, pale  appearing Eyes: No scleral icterus ENMT: Moist mucous membranes Respiratory: Clear to auscultation bilaterally without wheezing or crackles Cardiovascular: irregular, no murmurs, trace lower extremity edema Abdomen: Soft, nontender, nondistended, positive bowel sounds Skin: No rashes appreciated Neurologic: Grossly nonfocal with equal strength in all 4 extremities    Data Reviewed: I have independently  reviewed following labs and imaging studies   CBC: Recent Labs  Lab 03/22/18 0007  03/22/18 2119 03/23/18 0229 03/24/18 0306 03/25/18 0355 03/26/18 0656  WBC 17.7*   < > 10.5 9.0 6.8 8.4 7.8  NEUTROABS 14.6*  --   --  6.9 6.0  --   --   HGB 11.0*   < > 10.5* 9.5* 8.1* 6.8* 8.7*  HCT 33.8*   < > 31.7* 28.9* 24.9* 22.1* 27.5*  MCV 92.6   < > 89.3 88.9 93.3 95.7 92.3  PLT 278   < > 157 145* 135* 148* 159   < > = values in this interval not displayed.   Basic Metabolic Panel: Recent Labs  Lab 03/22/18 2119 03/23/18 0229 03/24/18 0306 03/25/18 0355 03/26/18 0656  NA 143 143 143 141 137  K 5.0 4.9 4.9 4.5 4.1  CL 111 112* 111 113* 107  CO2 25 25 20* 20* 21*  GLUCOSE 174* 194* 269* 179* 90  BUN 40* 40* 32* 35* 42*  CREATININE 1.51* 1.46* 1.30* 1.41* 1.62*  CALCIUM 8.4* 8.2* 8.3* 8.1* 8.3*   GFR: Estimated Creatinine Clearance: 20.2 mL/min (A) (by C-G formula based on SCr of 1.62 mg/dL (H)). Liver Function Tests: Recent Labs  Lab 03/22/18 0007 03/23/18 0229  AST 25 21  ALT 26 20  ALKPHOS 76 48  BILITOT 0.4 0.5  PROT 6.3* 5.4*  ALBUMIN 3.2* 2.7*   No results for input(s): LIPASE, AMYLASE in the last 168 hours. No results for input(s): AMMONIA in the last 168 hours. Coagulation Profile: Recent Labs  Lab 03/22/18 1335 03/23/18 0229 03/24/18 0306 03/25/18 0355 03/26/18 0656  INR 1.57 1.23 1.20 1.27 1.57   Cardiac Enzymes: Recent Labs  Lab 03/22/18 2119  TROPONINI 0.05*   BNP (last 3 results) No results for input(s): PROBNP in the last 8760 hours. HbA1C: No results for input(s): HGBA1C in the last 72 hours. CBG: Recent Labs  Lab 03/25/18 0630 03/25/18 1131 03/25/18 1624 03/25/18 2157 03/26/18 0611  GLUCAP 153* 172* 125* 83 74   Lipid Profile: No results for input(s): CHOL, HDL, LDLCALC, TRIG, CHOLHDL, LDLDIRECT in the last 72 hours. Thyroid Function Tests: No results for input(s): TSH, T4TOTAL, FREET4, T3FREE, THYROIDAB in the last 72  hours. Anemia Panel: No results for input(s): VITAMINB12, FOLATE, FERRITIN, TIBC, IRON, RETICCTPCT in the last 72 hours. Urine analysis:    Component Value Date/Time   COLORURINE YELLOW 03/22/2018 0221   APPEARANCEUR HAZY (A) 03/22/2018 0221   LABSPEC 1.014 03/22/2018 0221   PHURINE 7.0 03/22/2018 0221   GLUCOSEU 50 (A) 03/22/2018 0221   HGBUR NEGATIVE 03/22/2018 0221   BILIRUBINUR NEGATIVE 03/22/2018 0221   KETONESUR NEGATIVE 03/22/2018 0221   PROTEINUR NEGATIVE 03/22/2018 0221   UROBILINOGEN 0.2 02/10/2015 1034   NITRITE POSITIVE (A) 03/22/2018 0221   LEUKOCYTESUR TRACE (A) 03/22/2018 0221   Sepsis Labs: Invalid input(s): PROCALCITONIN, LACTICIDVEN  Recent Results (from the past 240 hour(s))  Urine culture     Status: Abnormal   Collection Time: 03/22/18  4:30 AM  Result Value Ref Range Status   Specimen Description   Final    URINE, CATHETERIZED Performed at [facility], [place] [address], [place], [place] [address]    Special Requests NONE  Final   Culture >=100,000 COLONIES/mL KLEBSIELLA PNEUMONIAE (A)  Final   Report Status 03/24/2018 FINAL  Final   Organism ID, Bacteria KLEBSIELLA PNEUMONIAE (A)  Final      Susceptibility   Klebsiella pneumoniae - MIC*    AMPICILLIN RESISTANT Resistant     CEFAZOLIN <=4 SENSITIVE Sensitive     CEFTRIAXONE <=1 SENSITIVE Sensitive     CIPROFLOXACIN <=0.25 SENSITIVE Sensitive     GENTAMICIN <=1 SENSITIVE Sensitive     IMIPENEM <=0.25 SENSITIVE Sensitive     NITROFURANTOIN 64 INTERMEDIATE Intermediate     TRIMETH/SULFA <=20 SENSITIVE Sensitive     AMPICILLIN/SULBACTAM 4 SENSITIVE Sensitive     PIP/TAZO <=4 SENSITIVE Sensitive     Extended ESBL NEGATIVE Sensitive     * >=100,000 COLONIES/mL KLEBSIELLA PNEUMONIAE  MRSA PCR Screening     Status: Abnormal   Collection Time: 03/23/18  2:57 AM  Result Value Ref Range Status   MRSA by PCR POSITIVE (A) NEGATIVE Final    Comment:        The GeneXpert MRSA Assay (FDA approved for  NASAL specimens only), is one component of a comprehensive MRSA colonization surveillance program. It is not intended to diagnose MRSA infection nor to guide or monitor treatment for MRSA infections. RESULT CALLED TO, READ BACK BY AND VERIFIED WITH: H. Laurence Compton [id number] 03/23/2018 Mena Goes Performed at [facility], [place] [address] [place], [place] [address]       Radiology Studies: No results found.   Scheduled Meds: . atorvastatin  40 mg Oral q1800  . B-complex with vitamin C  1 tablet Oral Daily  . docusate sodium  100 mg Oral BID  . escitalopram  10 mg Oral Daily  . insulin aspart  0-15 Units Subcutaneous TID WC  . metoprolol succinate  50 mg Oral Daily  . mupirocin ointment   Nasal BID  . Warfarin - Pharmacist Dosing Inpatient   Does not apply q1800   Continuous Infusions: . cefTRIAXone (ROCEPHIN)  IV Stopped (03/26/18 0530)  . lactated ringers    . sodium chloride 250 mL (03/26/18 0902)    Marzetta Board, MD, PhD [facility] Pager [phone]  If 7PM-7AM, please contact night-coverage [email or web address] Password [email or web address] 03/26/2018, 9:59 AM

## 2018-03-27 DIAGNOSIS — S7221XA Displaced subtrochanteric fracture of right femur, initial encounter for closed fracture: Secondary | ICD-10-CM | POA: Diagnosis not present

## 2018-03-27 DIAGNOSIS — S72001D Fracture of unspecified part of neck of right femur, subsequent encounter for closed fracture with routine healing: Secondary | ICD-10-CM | POA: Diagnosis not present

## 2018-03-27 DIAGNOSIS — R9431 Abnormal electrocardiogram [ECG] [EKG]: Secondary | ICD-10-CM | POA: Diagnosis not present

## 2018-03-27 DIAGNOSIS — E1122 Type 2 diabetes mellitus with diabetic chronic kidney disease: Secondary | ICD-10-CM | POA: Diagnosis present

## 2018-03-27 DIAGNOSIS — I5043 Acute on chronic combined systolic (congestive) and diastolic (congestive) heart failure: Secondary | ICD-10-CM | POA: Diagnosis not present

## 2018-03-27 DIAGNOSIS — H547 Unspecified visual loss: Secondary | ICD-10-CM | POA: Diagnosis present

## 2018-03-27 DIAGNOSIS — E876 Hypokalemia: Secondary | ICD-10-CM | POA: Diagnosis not present

## 2018-03-27 DIAGNOSIS — E872 Acidosis: Secondary | ICD-10-CM | POA: Diagnosis present

## 2018-03-27 DIAGNOSIS — M6281 Muscle weakness (generalized): Secondary | ICD-10-CM | POA: Diagnosis not present

## 2018-03-27 DIAGNOSIS — Z7901 Long term (current) use of anticoagulants: Secondary | ICD-10-CM | POA: Diagnosis not present

## 2018-03-27 DIAGNOSIS — A419 Sepsis, unspecified organism: Secondary | ICD-10-CM | POA: Diagnosis not present

## 2018-03-27 DIAGNOSIS — N183 Chronic kidney disease, stage 3 (moderate): Secondary | ICD-10-CM | POA: Diagnosis present

## 2018-03-27 DIAGNOSIS — E1151 Type 2 diabetes mellitus with diabetic peripheral angiopathy without gangrene: Secondary | ICD-10-CM | POA: Diagnosis not present

## 2018-03-27 DIAGNOSIS — E785 Hyperlipidemia, unspecified: Secondary | ICD-10-CM | POA: Diagnosis not present

## 2018-03-27 DIAGNOSIS — R6520 Severe sepsis without septic shock: Secondary | ICD-10-CM | POA: Diagnosis not present

## 2018-03-27 DIAGNOSIS — R402441 Other coma, without documented Glasgow coma scale score, or with partial score reported, in the field [EMT or ambulance]: Secondary | ICD-10-CM | POA: Diagnosis not present

## 2018-03-27 DIAGNOSIS — D649 Anemia, unspecified: Secondary | ICD-10-CM | POA: Diagnosis present

## 2018-03-27 DIAGNOSIS — I481 Persistent atrial fibrillation: Secondary | ICD-10-CM | POA: Diagnosis not present

## 2018-03-27 DIAGNOSIS — E7849 Other hyperlipidemia: Secondary | ICD-10-CM | POA: Diagnosis not present

## 2018-03-27 DIAGNOSIS — D6489 Other specified anemias: Secondary | ICD-10-CM | POA: Diagnosis not present

## 2018-03-27 DIAGNOSIS — R748 Abnormal levels of other serum enzymes: Secondary | ICD-10-CM | POA: Diagnosis not present

## 2018-03-27 DIAGNOSIS — I5041 Acute combined systolic (congestive) and diastolic (congestive) heart failure: Secondary | ICD-10-CM | POA: Diagnosis not present

## 2018-03-27 DIAGNOSIS — R2681 Unsteadiness on feet: Secondary | ICD-10-CM | POA: Diagnosis not present

## 2018-03-27 DIAGNOSIS — Z66 Do not resuscitate: Secondary | ICD-10-CM | POA: Diagnosis present

## 2018-03-27 DIAGNOSIS — E038 Other specified hypothyroidism: Secondary | ICD-10-CM | POA: Diagnosis not present

## 2018-03-27 DIAGNOSIS — M2550 Pain in unspecified joint: Secondary | ICD-10-CM | POA: Diagnosis not present

## 2018-03-27 DIAGNOSIS — E861 Hypovolemia: Secondary | ICD-10-CM | POA: Diagnosis present

## 2018-03-27 DIAGNOSIS — E440 Moderate protein-calorie malnutrition: Secondary | ICD-10-CM | POA: Diagnosis not present

## 2018-03-27 DIAGNOSIS — S72143A Displaced intertrochanteric fracture of unspecified femur, initial encounter for closed fracture: Secondary | ICD-10-CM | POA: Diagnosis not present

## 2018-03-27 DIAGNOSIS — J189 Pneumonia, unspecified organism: Secondary | ICD-10-CM | POA: Diagnosis not present

## 2018-03-27 DIAGNOSIS — Y95 Nosocomial condition: Secondary | ICD-10-CM | POA: Diagnosis present

## 2018-03-27 DIAGNOSIS — I248 Other forms of acute ischemic heart disease: Secondary | ICD-10-CM | POA: Diagnosis not present

## 2018-03-27 DIAGNOSIS — N390 Urinary tract infection, site not specified: Secondary | ICD-10-CM | POA: Diagnosis not present

## 2018-03-27 DIAGNOSIS — G2581 Restless legs syndrome: Secondary | ICD-10-CM | POA: Diagnosis present

## 2018-03-27 DIAGNOSIS — N179 Acute kidney failure, unspecified: Secondary | ICD-10-CM | POA: Diagnosis not present

## 2018-03-27 DIAGNOSIS — M6389 Disorders of muscle in diseases classified elsewhere, multiple sites: Secondary | ICD-10-CM | POA: Diagnosis not present

## 2018-03-27 DIAGNOSIS — E1169 Type 2 diabetes mellitus with other specified complication: Secondary | ICD-10-CM | POA: Diagnosis not present

## 2018-03-27 DIAGNOSIS — R41841 Cognitive communication deficit: Secondary | ICD-10-CM | POA: Diagnosis not present

## 2018-03-27 DIAGNOSIS — R278 Other lack of coordination: Secondary | ICD-10-CM | POA: Diagnosis not present

## 2018-03-27 DIAGNOSIS — I2510 Atherosclerotic heart disease of native coronary artery without angina pectoris: Secondary | ICD-10-CM | POA: Diagnosis not present

## 2018-03-27 DIAGNOSIS — N3000 Acute cystitis without hematuria: Secondary | ICD-10-CM | POA: Diagnosis not present

## 2018-03-27 DIAGNOSIS — S72001A Fracture of unspecified part of neck of right femur, initial encounter for closed fracture: Secondary | ICD-10-CM | POA: Diagnosis not present

## 2018-03-27 DIAGNOSIS — I130 Hypertensive heart and chronic kidney disease with heart failure and stage 1 through stage 4 chronic kidney disease, or unspecified chronic kidney disease: Secondary | ICD-10-CM | POA: Diagnosis present

## 2018-03-27 DIAGNOSIS — I10 Essential (primary) hypertension: Secondary | ICD-10-CM | POA: Diagnosis not present

## 2018-03-27 DIAGNOSIS — E1140 Type 2 diabetes mellitus with diabetic neuropathy, unspecified: Secondary | ICD-10-CM | POA: Diagnosis present

## 2018-03-27 DIAGNOSIS — M79604 Pain in right leg: Secondary | ICD-10-CM | POA: Diagnosis not present

## 2018-03-27 DIAGNOSIS — I428 Other cardiomyopathies: Secondary | ICD-10-CM | POA: Diagnosis present

## 2018-03-27 DIAGNOSIS — E11319 Type 2 diabetes mellitus with unspecified diabetic retinopathy without macular edema: Secondary | ICD-10-CM | POA: Diagnosis present

## 2018-03-27 DIAGNOSIS — G9341 Metabolic encephalopathy: Secondary | ICD-10-CM | POA: Diagnosis not present

## 2018-03-27 DIAGNOSIS — R509 Fever, unspecified: Secondary | ICD-10-CM | POA: Diagnosis not present

## 2018-03-27 DIAGNOSIS — I4891 Unspecified atrial fibrillation: Secondary | ICD-10-CM | POA: Diagnosis not present

## 2018-03-27 DIAGNOSIS — K5900 Constipation, unspecified: Secondary | ICD-10-CM | POA: Diagnosis not present

## 2018-03-27 DIAGNOSIS — I480 Paroxysmal atrial fibrillation: Secondary | ICD-10-CM | POA: Diagnosis not present

## 2018-03-27 DIAGNOSIS — Z7401 Bed confinement status: Secondary | ICD-10-CM | POA: Diagnosis not present

## 2018-03-27 DIAGNOSIS — Z9001 Acquired absence of eye: Secondary | ICD-10-CM | POA: Diagnosis not present

## 2018-03-27 LAB — CBC
HEMATOCRIT: 26.5 % — AB (ref 36.0–46.0)
HEMOGLOBIN: 8.3 g/dL — AB (ref 12.0–15.0)
MCH: 29.5 pg (ref 26.0–34.0)
MCHC: 31.3 g/dL (ref 30.0–36.0)
MCV: 94.3 fL (ref 78.0–100.0)
Platelets: 146 10*3/uL — ABNORMAL LOW (ref 150–400)
RBC: 2.81 MIL/uL — ABNORMAL LOW (ref 3.87–5.11)
RDW: 16.4 % — AB (ref 11.5–15.5)
WBC: 6.7 10*3/uL (ref 4.0–10.5)

## 2018-03-27 LAB — GLUCOSE, CAPILLARY
GLUCOSE-CAPILLARY: 144 mg/dL — AB (ref 65–99)
Glucose-Capillary: 82 mg/dL (ref 65–99)

## 2018-03-27 LAB — PROTIME-INR
INR: 2.16
Prothrombin Time: 23.9 seconds — ABNORMAL HIGH (ref 11.4–15.2)

## 2018-03-27 LAB — BASIC METABOLIC PANEL
Anion gap: 10 (ref 5–15)
BUN: 51 mg/dL — AB (ref 6–20)
CHLORIDE: 110 mmol/L (ref 101–111)
CO2: 20 mmol/L — ABNORMAL LOW (ref 22–32)
Calcium: 8.5 mg/dL — ABNORMAL LOW (ref 8.9–10.3)
Creatinine, Ser: 1.59 mg/dL — ABNORMAL HIGH (ref 0.44–1.00)
GFR, EST AFRICAN AMERICAN: 32 mL/min — AB (ref >60)
GFR, EST NON AFRICAN AMERICAN: 27 mL/min — AB (ref >60)
GLUCOSE: 97 mg/dL (ref 65–99)
POTASSIUM: 4.5 mmol/L (ref 3.5–5.1)
Sodium: 140 mmol/L (ref 135–145)

## 2018-03-27 MED ORDER — WARFARIN SODIUM 4 MG PO TABS
4.0000 mg | ORAL_TABLET | Freq: Once | ORAL | Status: DC
  Filled 2018-03-27: qty 1

## 2018-03-27 NOTE — Discharge Summary (Signed)
Physician Discharge Summary  Kylie Sanchez RAQ:[id number] DOB: 1925/07/06 DOA: 03/21/2018  PCP: Unk Pinto, MD  Admit date: 03/21/2018 Discharge date: 03/27/2018  Admitted From: home Disposition:  SNF  Recommendations for Outpatient Follow-up:  1. Follow up with PCP in 1-2 weeks 2. Please obtain BMP/CBC in 304 days 3. Follow up with Dr. Ninfa Linden in 2 weeks  Home Health: none Equipment/Devices: none  Discharge Condition: stable CODE STATUS: DNR Diet recommendation: regular  HPI: Per Dr. Ara Sanchez, Kylie Sanchez is a 84 y.o. female with a known history of CKD, HTN, HLD, Afib on Coumadin, DM presents to the emergency department for evaluation of right hip pain s/p mechanical fall.  Patient was in a usual state of health until this evening when she got was not using her walker and fell landing on hardwood floors.Complains of right hip pain.  Lives with her younger brother. Patient denies fevers/chills, weakness, dizziness, chest pain, shortness of breath, N/V/C/D, abdominal pain, dysuria/frequency, changes in mental status.  Otherwise there has been no change in status. Patient has been taking medication as prescribed and there has been no recent change in medication or diet. EMS/ED Course: Patient received Rocephin, mophine, NS. Medical admission has been requested for further management of right intertroch hip fracture.  Hospital Course: Closed right intertrochanteric fracture -Orthopedic surgery consulted, appreciate input, she is status post IM nailing by Dr. Ninfa Linden on 5/14. Continue rehab in SNF setting.  Acute blood loss anemia -Postop, hemoglobin less than 7 on 5/15, received a unit of packed red blood cells and improved appropriately. Stable Hypotension -On presentation, improved Permanent A. fib  -Rate controlled with metoprolol, continue Coumadin Type 2 diabetes mellitus, controlled, with renal complications -Controlled, A1c 6.7, resume home medications. Monitor renal function  while on Metformin Chronic kidney disease stage III - IV -baseline Cr 1.3-1.5, small bump in the hospital due to post op urinary retention and hypotension. BP improved, she did require foley placement. Please maintain foley for 2-3 days and then once more mobile give voiding trial Depression -Continue Lexapro Hyperlipidemia -Continue Lipitor Urinary tract infection -She speciated Klebsiella, she completed a course of ceftriaxone in the hospital for 5 days.    Discharge Diagnoses:  Principal Problem:   Closed right hip fracture, initial encounter [facility]) Active Problems:   Hyperlipidemia   Persistent atrial fibrillation (HCC)   Type 2 diabetes mellitus with hyperlipidemia ([place])   Hip fracture ([place])   Acute lower UTI   Discharge Instructions  Discharge Instructions    Full weight bearing   Complete by:  As directed      Allergies as of 03/27/2018      Reactions   No Known Allergies       Medication List    TAKE these medications   aspirin 81 MG EC tablet Take 1 tablet (81 mg total) by mouth daily.   atorvastatin 40 MG tablet Commonly known as:  LIPITOR Take 1 tablet (40 mg total) by mouth daily at 6 PM.   b complex vitamins tablet Take 1 tablet by mouth daily.   blood glucose meter kit and supplies Test sugars once daily. Dispense based insurance preference. E11.9   Co Q 10 100 MG Caps Take 100 mg by mouth every evening.   escitalopram 10 MG tablet Commonly known as:  LEXAPRO TAKE ONE (1) TABLET BY MOUTH EVERY DAY What changed:    how much to take  how to take this  when to take this  additional instructions   furosemide 20  MG tablet Commonly known as:  LASIX Take 1 tablet daily for BP & Fluid   glimepiride 2 MG tablet Commonly known as:  AMARYL TAKE ONE (1) TABLET BY MOUTH EVERY DAY   glucose blood test strip Check sugars once to twice a day for diabetes E11.21   HYDROcodone-acetaminophen 5-325 MG tablet Commonly known as:  NORCO/VICODIN Take  1-2 tablets by mouth every 4 (four) hours as needed for moderate pain (pain score 4-6).   metFORMIN 500 MG 24 hr tablet Commonly known as:  GLUCOPHAGE-XR Take 1 tablet (500 mg total) by mouth every morning. Take with meal for diabetes   metoprolol succinate 100 MG 24 hr tablet Commonly known as:  TOPROL-XL TAKE ONE (1) TABLET BY MOUTH TWO (2) TIMES DAILY WITH OR IMMEDIATELY FOLLOWING A MEAL What changed:    how much to take  how to take this  when to take this  additional instructions   nitroGLYCERIN 0.4 MG SL tablet Commonly known as:  NITROSTAT Place 1 tablet (0.4 mg total) under the tongue every 5 (five) minutes as needed for chest pain.   VITAMIN D PO Take 1 tablet by mouth every evening.   warfarin 4 MG tablet Commonly known as:  COUMADIN Take as directed. If you are unsure how to take this medication, talk to your nurse or doctor. Original instructions:  Take as directed by [facility] What changed:    how much to take  how to take this  when to take this  additional instructions            Discharge Care Instructions  (From admission, onward)        Start     Ordered   03/25/18 0000  Full weight bearing     03/25/18 9629      Contact information for follow-up providers    Mcarthur Rossetti, MD. Schedule an appointment as soon as possible for a visit in 2 week(s).   Specialty:  Orthopedic Surgery Contact information: [place] [address] [phone]            Contact information for after-discharge care    Destination    [facility] .   Service:  Skilled Nursing Contact information: [place] [phone]                  Consultations:  Orthopedic surgery   Procedures/Studies: Open reduction and internal fixation of right subtrochanteric femur fracture by intramedullary rod and hip screw on 03/23/2018     Dg Chest  1 View  Result Date: 03/22/2018 CLINICAL DATA:  Closed fracture of the right hip.  Preop. EXAM: CHEST  1 VIEW COMPARISON:  05/18/2017 FINDINGS: Heart is top-normal in size. There is aortic atherosclerosis. No definite aneurysm. Left-sided pacemaker apparatus right atrial and right ventricular leads are redemonstrated. There is scarring overlying the left costophrenic angle and to a lesser degree the right costophrenic angle. Clearing of right-sided pleural effusions since previous exam. No overt pulmonary edema or pulmonary consolidation. Intact bony thorax. 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 IMPRESSION: No pneumonia.  Bibasilar scarring.  Aortic atherosclerosis. Electronically Signed   By: Ashley Royalty M.D.   On: 03/22/2018 01:42   Ct Head Wo Contrast  Result Date: 03/22/2018 CLINICAL DATA:  Status post fall last night. EXAM: CT HEAD WITHOUT CONTRAST TECHNIQUE: Contiguous axial images were obtained from the base of the skull through the vertex without intravenous  contrast. COMPARISON:  May 13, 2017 FINDINGS: Brain: No evidence of acute infarction, hemorrhage, hydrocephalus, extra-axial collection or mass lesion/mass effect. There is chronic diffuse atrophy. Chronic bilateral periventricular white matter small vessel ischemic changes noted. Small old right basal ganglia lacunar infarction is identified unchanged. Vascular: No hyperdense vessel or unexpected calcification. 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 Sinuses/Orbits: No acute abnormality. 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 Other: Right parietal scalp swelling and hematoma is noted. IMPRESSION: No focal acute intracranial abnormality identified. Right parietal scalp swelling hematoma is identified. Electronically Signed   By: Abelardo Diesel M.D.   On: 03/22/2018 07:53   Dg C-arm 1-60 Min  Result Date: 03/23/2018 CLINICAL DATA:  Right hip fracture EXAM:  DG C-ARM 61-120 MIN; RIGHT FEMUR 2 VIEWS COMPARISON:  03/22/2018 FINDINGS: Total fluoroscopy time was 3 minutes 24 seconds. Six low resolution intraoperative spot views of the right hip. Images demonstrate intramedullary rod and screw fixation of the right femur for intertrochanteric fracture. IMPRESSION: Intraoperative fluoroscopic assistance provided during right hip surgery Electronically Signed   By: Donavan Foil M.D.   On: 03/23/2018 19:18   Dg Hip Unilat With Pelvis 2-3 Views Left  Result Date: 03/22/2018 CLINICAL DATA:  84 year old female with fall. EXAM: DG HIP (WITH OR WITHOUT PELVIS) 2-3V LEFT; DG HIP (WITH OR WITHOUT PELVIS) 2-3V RIGHT COMPARISON:  None. FINDINGS: There is a comminuted and mildly displaced intertrochanteric fracture of the right femur with mild proximal migration of the femoral diaphysis. There is no fracture or dislocation of the left hip. Sclerotic area involving the right inferior pubic ramus, age indeterminate, possibly chronic. Clinical correlation is recommended. There is advanced osteopenia. There is degenerative changes of the lower lumbar spine. The soft tissues are grossly unremarkable. IMPRESSION: Mildly displaced intertrochanteric fracture of the right femur. No dislocation. Electronically Signed   By: Anner Crete M.D.   On: 03/22/2018 01:39   Dg Hip Unilat  With Pelvis 2-3 Views Right  Result Date: 03/22/2018 CLINICAL DATA:  84 year old female with fall. EXAM: DG HIP (WITH OR WITHOUT PELVIS) 2-3V LEFT; DG HIP (WITH OR WITHOUT PELVIS) 2-3V RIGHT COMPARISON:  None. FINDINGS: There is a comminuted and mildly displaced intertrochanteric fracture of the right femur with mild proximal migration of the femoral diaphysis. There is no fracture or dislocation of the left hip. Sclerotic area involving the right inferior pubic ramus, age indeterminate, possibly chronic. Clinical correlation is recommended. There is advanced osteopenia. There is degenerative changes of the  lower lumbar spine. The soft tissues are grossly unremarkable. IMPRESSION: Mildly displaced intertrochanteric fracture of the right femur. No dislocation. Electronically Signed   By: Anner Crete M.D.   On: 03/22/2018 01:39   Dg Femur, Min 2 Views Right  Result Date: 03/23/2018 CLINICAL DATA:  Right hip fracture EXAM: DG C-ARM 61-120 MIN; RIGHT FEMUR 2 VIEWS COMPARISON:  03/22/2018 FINDINGS: Total fluoroscopy time was 3 minutes 24 seconds. Six low resolution intraoperative spot views of the right hip. Images demonstrate intramedullary rod and screw fixation of the right femur for intertrochanteric fracture. IMPRESSION: Intraoperative fluoroscopic assistance provided during right hip surgery Electronically Signed   By: Donavan Foil M.D.   On: 03/23/2018 19:18     Subjective: -upset about being stuck for blood work this morning. No chest pain, dyspnea, no nausea/vomiting.   Discharge Exam: Vitals:   03/26/18 1813 03/26/18 2145  BP:  (!) 101/56  Pulse:  85  Resp:  18  Temp:  98.1 F (36.7 C)  SpO2: 98% 96%    General: Pt is alert, awake, not in acute distress Cardiovascular: irregular Respiratory: CTA bilaterally, no wheezing, no rhonchi Abdominal: Soft, NT, ND, bowel sounds + Extremities: no edema, no cyanosis    The results of significant diagnostics from this hospitalization (including imaging, microbiology, ancillary and laboratory) are listed below for reference.     Microbiology: Recent Results (from the past 240 hour(s))  Urine culture     Status: Abnormal   Collection Time: 03/22/18  4:30 AM  Result Value Ref Range Status   Specimen Description   Final    URINE, CATHETERIZED Performed at [facility], [address], [place], [place] [address]    Special Requests NONE  Final   Culture >=100,000 COLONIES/mL KLEBSIELLA PNEUMONIAE (A)  Final   Report Status 03/24/2018 FINAL  Final   Organism ID, Bacteria KLEBSIELLA PNEUMONIAE (A)  Final      Susceptibility     Klebsiella pneumoniae - MIC*    AMPICILLIN RESISTANT Resistant     CEFAZOLIN <=4 SENSITIVE Sensitive     CEFTRIAXONE <=1 SENSITIVE Sensitive     CIPROFLOXACIN <=0.25 SENSITIVE Sensitive     GENTAMICIN <=1 SENSITIVE Sensitive     IMIPENEM <=0.25 SENSITIVE Sensitive     NITROFURANTOIN 64 INTERMEDIATE Intermediate     TRIMETH/SULFA <=20 SENSITIVE Sensitive     AMPICILLIN/SULBACTAM 4 SENSITIVE Sensitive     PIP/TAZO <=4 SENSITIVE Sensitive     Extended ESBL NEGATIVE Sensitive     * >=100,000 COLONIES/mL KLEBSIELLA PNEUMONIAE  MRSA PCR Screening     Status: Abnormal   Collection Time: 03/23/18  2:57 AM  Result Value Ref Range Status   MRSA by PCR POSITIVE (A) NEGATIVE Final    Comment:        The GeneXpert MRSA Assay (FDA approved for NASAL specimens only), is one component of a comprehensive MRSA colonization surveillance program. It is not intended to diagnose MRSA infection nor to guide or monitor treatment for MRSA infections. RESULT CALLED TO, READ BACK BY AND VERIFIED WITH: H. Laurence Compton 0174 03/23/2018 Mena Goes Performed at [facility], [place] [address], [place], [place] [address]      Labs: BNP (last 3 results) No results for input(s): BNP in the last 8760 hours. Basic Metabolic Panel: Recent Labs  Lab 03/23/18 0229 03/24/18 0306 03/25/18 0355 03/26/18 0656 03/27/18 0754  NA 143 143 141 137 140  K 4.9 4.9 4.5 4.1 4.5  CL 112* 111 113* 107 110  CO2 25 20* 20* 21* 20*  GLUCOSE 194* 269* 179* 90 97  BUN 40* 32* 35* 42* 51*  CREATININE 1.46* 1.30* 1.41* 1.62* 1.59*  CALCIUM 8.2* 8.3* 8.1* 8.3* 8.5*   Liver Function Tests: Recent Labs  Lab 03/22/18 0007 03/23/18 0229  AST 25 21  ALT 26 20  ALKPHOS 76 48  BILITOT 0.4 0.5  PROT 6.3* 5.4*  ALBUMIN 3.2* 2.7*   No results for input(s): LIPASE, AMYLASE in the last 168 hours. No results for input(s): AMMONIA in the last 168 hours. CBC: Recent Labs  Lab 03/22/18 0007  03/23/18 0229 03/24/18 0306  03/25/18 0355 03/26/18 0656 03/27/18 0754  WBC 17.7*   < > 9.0 6.8 8.4 7.8 6.7  NEUTROABS 14.6*  --  6.9 6.0  --   --   --   HGB 11.0*   < > 9.5* 8.1* 6.8* 8.7* 8.3*  HCT 33.8*   < > 28.9*  24.9* 22.1* 27.5* 26.5*  MCV 92.6   < > 88.9 93.3 95.7 92.3 94.3  PLT 278   < > 145* 135* 148* 159 146*   < > = values in this interval not displayed.   Cardiac Enzymes: Recent Labs  Lab 03/22/18 2119  TROPONINI 0.05*   BNP: Invalid input(s): POCBNP CBG: Recent Labs  Lab 03/26/18 0611 03/26/18 1233 03/26/18 1631 03/26/18 2143 03/27/18 0808  GLUCAP 74 114* 126* 130* 82   D-Dimer No results for input(s): DDIMER in the last 72 hours. Hgb A1c No results for input(s): HGBA1C in the last 72 hours. Lipid Profile No results for input(s): CHOL, HDL, LDLCALC, TRIG, CHOLHDL, LDLDIRECT in the last 72 hours. Thyroid function studies No results for input(s): TSH, T4TOTAL, T3FREE, THYROIDAB in the last 72 hours.  Invalid input(s): FREET3 Anemia work up No results for input(s): VITAMINB12, FOLATE, FERRITIN, TIBC, IRON, RETICCTPCT in the last 72 hours. Urinalysis    Component Value Date/Time   COLORURINE YELLOW 03/25/2018 1816   APPEARANCEUR HAZY (A) 03/25/2018 1816   LABSPEC 1.025 03/25/2018 1816   PHURINE 5.5 03/25/2018 1816   GLUCOSEU NEGATIVE 03/25/2018 1816   HGBUR NEGATIVE 03/25/2018 1816   BILIRUBINUR NEGATIVE 03/25/2018 1816   KETONESUR 15 (A) 03/25/2018 1816   PROTEINUR 30 (A) 03/25/2018 1816   UROBILINOGEN 0.2 02/10/2015 1034   NITRITE NEGATIVE 03/25/2018 1816   LEUKOCYTESUR TRACE (A) 03/25/2018 1816   Sepsis Labs Invalid input(s): PROCALCITONIN,  WBC,  LACTICIDVEN   Time coordinating discharge: 35 minutes  SIGNED:  Marzetta Board, MD  [facility] 03/27/2018, 9:27 AM Pager [phone]  If 7PM-7AM, please contact night-coverage [email or web address] Password [email or web address]

## 2018-03-27 NOTE — Social Work (Addendum)
Clinical Social Worker facilitated patient discharge including contacting patient family and facility to confirm patient discharge plans.  Clinical information faxed to facility and family agreeable with plan.    CSW arranged ambulance transport via [facility] to [facility].    RN to call [phone] to give report prior to discharge.  Pt going to Room 208.  Clinical Social Worker will sign off for now as social work intervention is no longer needed. Please consult [place] again if new need arises.  Elissa Hefty, LCSW Clinical Social Worker [phone]

## 2018-03-27 NOTE — Clinical Social Work Placement (Signed)
   CLINICAL SOCIAL WORK PLACEMENT  NOTE  Date:  03/27/2018  Patient Details  Name: Kylie Sanchez MRN: [id number] Date of Birth: 1925-06-30  Clinical Social Work is seeking post-discharge placement for this patient at the [place] level of care (*CSW will initial, date and re-position this form in  chart as items are completed):  Yes   Patient/family provided with [facility] Work Department's list of facilities offering this level of care within the geographic area requested by the patient (or if unable, by the patient's family).  Yes   Patient/family informed of their freedom to choose among providers that offer the needed level of care, that participate in Medicare, Medicaid or managed care program needed by the patient, have an available bed and are willing to accept the patient.  Yes   Patient/family informed of [facility]'s ownership interest in [facility] and [facility], as well as of the fact that they are under no obligation to receive care at these facilities.  PASRR submitted to EDS on       PASRR number received on       Existing PASRR number confirmed on 03/17/18     FL2 transmitted to all facilities in geographic area requested by pt/family on 03/25/18     FL2 transmitted to all facilities within larger geographic area on       Patient informed that his/her managed care company has contracts with or will negotiate with certain facilities, including the following:        Yes   Patient/family informed of bed offers received.  Patient chooses bed at [facility]     Physician recommends and patient chooses bed at      Patient to be transferred to [facility] on 03/27/18.  Patient to be transferred to facility by [facility]     Patient family notified on 03/27/18 of transfer.  Name of family member notified:  pt neice advised of transport, [facility]       Additional Comment:     _______________________________________________ Normajean Baxter, LCSW 03/27/2018, 10:16 AM

## 2018-03-27 NOTE — Progress Notes (Signed)
ANTICOAGULATION CONSULT NOTE - Follow Up Consult  Pharmacy Consult: Coumadin Indication: atrial fibrillation  Allergies  Allergen Reactions  . No Known Allergies    Patient Measurements: Height: 5\' 2"  (157.5 cm) Weight: (Unable to get weight due to bed scale broken ) IBW/kg (Calculated) : 50.1  Vital Signs: Temp: 98.1 F (36.7 C) (05/16 2145) Temp Source: Oral (05/16 2145) BP: 101/56 (05/16 2145) Pulse Rate: 85 (05/16 2145)  Labs: Recent Labs    03/25/18 0355 03/26/18 0656 03/27/18 0754  HGB 6.8* 8.7* 8.3*  HCT 22.1* 27.5* 26.5*  PLT 148* 159 146*  LABPROT 15.8* 18.6* 23.9*  INR 1.27 1.57 2.16  CREATININE 1.41* 1.62* 1.59*    Estimated Creatinine Clearance: 20.6 mL/min (A) (by C-G formula based on SCr of 1.59 mg/dL (H)).   Assessment: 46 YOF resuming Coumadin from [facility] for PAF following surgery for a hip fracture. Given vitamin K 10 mg on 5/12.  INR increased to therapeutic level today; no bleeding reported.  PTA regimen: 4 mg daily exc 2 mg on Tue/Sat   Goal of Therapy:  INR 2-3 Monitor platelets by anticoagulation protocol: Yes    Plan:  Repeat Coumadin 4 mg PO today Daily PT / INR Monitor for s/sx of bleeding F/U CTX LOT   Marcella Dunnaway D. Mina Marble, PharmD, BCPS, BCCCP Pager:  [phone] 03/27/2018, 9:18 AM

## 2018-03-27 NOTE — Progress Notes (Signed)
Patient discharging to SNF via [facility], niece at bed side. Med x1 with prn apin med prior to d/c.

## 2018-03-27 NOTE — Progress Notes (Signed)
Patient pending  discharge to [facility]. Report called in to [facility].

## 2018-03-27 NOTE — Plan of Care (Signed)
  Problem: Education: Goal: Knowledge of General Education information will improve Outcome: Progressing   Problem: Health Behavior/Discharge Planning: Goal: Ability to manage health-related needs will improve Outcome: Progressing   Problem: Clinical Measurements: Goal: Will remain free from infection Outcome: Progressing   Problem: Activity: Goal: Risk for activity intolerance will decrease Outcome: Progressing   Problem: Nutrition: Goal: Adequate nutrition will be maintained Outcome: Progressing

## 2018-03-30 ENCOUNTER — Ambulatory Visit (INDEPENDENT_AMBULATORY_CARE_PROVIDER_SITE_OTHER): Payer: No Typology Code available for payment source

## 2018-03-30 ENCOUNTER — Encounter: Payer: Self-pay | Admitting: Adult Health

## 2018-03-30 ENCOUNTER — Encounter (INDEPENDENT_AMBULATORY_CARE_PROVIDER_SITE_OTHER): Payer: Self-pay | Admitting: Orthopaedic Surgery

## 2018-03-30 ENCOUNTER — Ambulatory Visit (INDEPENDENT_AMBULATORY_CARE_PROVIDER_SITE_OTHER): Payer: No Typology Code available for payment source | Admitting: Orthopaedic Surgery

## 2018-03-30 DIAGNOSIS — M79604 Pain in right leg: Secondary | ICD-10-CM | POA: Diagnosis not present

## 2018-03-30 DIAGNOSIS — S7221XA Displaced subtrochanteric fracture of right femur, initial encounter for closed fracture: Secondary | ICD-10-CM | POA: Diagnosis not present

## 2018-03-30 NOTE — Progress Notes (Signed)
The patient is a 84 year old female who is only a week out from fixation of a complex right hip fracture.  This was a intertrochanteric fracture with subtrochanteric extension and it required a significant open procedure to get it reduced.  She is also on Coumadin.  She sent over early from skilled nursing due to her pain but also for [place] to assess the incision due to drainage.  On examination her incision and drainage is appropriate given the extent of her surgery and the swelling she has in her thigh.  Also she is on blood thinning medications.  There is no evidence of infection at all.  There is no redness and no odor no purulence.  This is mainly due to the extensive edema in this area from the extent of her surgery.  X-rays show intact hardware with no complicating features.  At this point the nursing facility should still expect drainage but she does not need to be on antibiotics from my standpoint.  She does need daily dressing changes or even twice daily until this does dry up which I expect will.  I would like to see her back in 2 weeks to remove the sutures but and we still need x-rays at that visit with 2 views of the right hip given the nature of this injury and the fact that we will have her weight-bear as tolerated which she does tolerate..  She can still attend to weight-bear as tolerated.

## 2018-04-01 DIAGNOSIS — E1151 Type 2 diabetes mellitus with diabetic peripheral angiopathy without gangrene: Secondary | ICD-10-CM | POA: Diagnosis not present

## 2018-04-01 DIAGNOSIS — E038 Other specified hypothyroidism: Secondary | ICD-10-CM | POA: Diagnosis not present

## 2018-04-01 DIAGNOSIS — S72143A Displaced intertrochanteric fracture of unspecified femur, initial encounter for closed fracture: Secondary | ICD-10-CM | POA: Diagnosis not present

## 2018-04-01 DIAGNOSIS — N390 Urinary tract infection, site not specified: Secondary | ICD-10-CM | POA: Diagnosis not present

## 2018-04-01 DIAGNOSIS — I2510 Atherosclerotic heart disease of native coronary artery without angina pectoris: Secondary | ICD-10-CM | POA: Diagnosis not present

## 2018-04-01 DIAGNOSIS — D6489 Other specified anemias: Secondary | ICD-10-CM | POA: Diagnosis not present

## 2018-04-01 DIAGNOSIS — E440 Moderate protein-calorie malnutrition: Secondary | ICD-10-CM | POA: Diagnosis not present

## 2018-04-01 DIAGNOSIS — I10 Essential (primary) hypertension: Secondary | ICD-10-CM | POA: Diagnosis not present

## 2018-04-03 LAB — CUP PACEART REMOTE DEVICE CHECK
Battery Impedance: 380 Ohm
Battery Voltage: 2.78 V
Brady Statistic AP VP Percent: 0 %
Brady Statistic AP VS Percent: 23 %
Brady Statistic AS VS Percent: 77 %
Implantable Lead Implant Date: 20130610
Implantable Lead Location: 753859
Implantable Lead Model: 5076
Implantable Lead Model: 5092
Lead Channel Impedance Value: 326 Ohm
Lead Channel Impedance Value: 798 Ohm
Lead Channel Pacing Threshold Amplitude: 0.5 V
Lead Channel Pacing Threshold Pulse Width: 0.4 ms
Lead Channel Setting Pacing Amplitude: 2 V
Lead Channel Setting Pacing Amplitude: 2.5 V
Lead Channel Setting Pacing Pulse Width: 0.4 ms
MDC IDC LEAD IMPLANT DT: 20130610
MDC IDC LEAD LOCATION: 753860
MDC IDC MSMT BATTERY REMAINING LONGEVITY: 108 mo
MDC IDC MSMT LEADCHNL RV PACING THRESHOLD AMPLITUDE: 0.75 V
MDC IDC MSMT LEADCHNL RV PACING THRESHOLD PULSEWIDTH: 0.4 ms
MDC IDC PG IMPLANT DT: 20130610
MDC IDC SESS DTM: 20190506155312
MDC IDC SET LEADCHNL RV SENSING SENSITIVITY: 5.6 mV
MDC IDC STAT BRADY AS VP PERCENT: 0 %

## 2018-04-04 ENCOUNTER — Emergency Department (HOSPITAL_COMMUNITY): Payer: Medicare Other

## 2018-04-04 ENCOUNTER — Inpatient Hospital Stay (HOSPITAL_COMMUNITY)
Admission: EM | Admit: 2018-04-04 | Discharge: 2018-04-15 | DRG: 871 | Disposition: A | Discharge status: Skilled Nursing Facility | Payer: Medicare Other | Attending: Internal Medicine | Admitting: Internal Medicine

## 2018-04-04 DIAGNOSIS — R2681 Unsteadiness on feet: Secondary | ICD-10-CM | POA: Diagnosis not present

## 2018-04-04 DIAGNOSIS — E785 Hyperlipidemia, unspecified: Secondary | ICD-10-CM | POA: Diagnosis present

## 2018-04-04 DIAGNOSIS — R52 Pain, unspecified: Secondary | ICD-10-CM

## 2018-04-04 DIAGNOSIS — A419 Sepsis, unspecified organism: Secondary | ICD-10-CM | POA: Diagnosis not present

## 2018-04-04 DIAGNOSIS — I428 Other cardiomyopathies: Secondary | ICD-10-CM | POA: Diagnosis present

## 2018-04-04 DIAGNOSIS — Z8781 Personal history of (healed) traumatic fracture: Secondary | ICD-10-CM

## 2018-04-04 DIAGNOSIS — I5041 Acute combined systolic (congestive) and diastolic (congestive) heart failure: Secondary | ICD-10-CM

## 2018-04-04 DIAGNOSIS — Y95 Nosocomial condition: Secondary | ICD-10-CM | POA: Diagnosis present

## 2018-04-04 DIAGNOSIS — Z7901 Long term (current) use of anticoagulants: Secondary | ICD-10-CM | POA: Diagnosis not present

## 2018-04-04 DIAGNOSIS — I5043 Acute on chronic combined systolic (congestive) and diastolic (congestive) heart failure: Secondary | ICD-10-CM | POA: Diagnosis not present

## 2018-04-04 DIAGNOSIS — I481 Persistent atrial fibrillation: Secondary | ICD-10-CM | POA: Diagnosis not present

## 2018-04-04 DIAGNOSIS — E038 Other specified hypothyroidism: Secondary | ICD-10-CM | POA: Diagnosis not present

## 2018-04-04 DIAGNOSIS — Z9001 Acquired absence of eye: Secondary | ICD-10-CM

## 2018-04-04 DIAGNOSIS — E1140 Type 2 diabetes mellitus with diabetic neuropathy, unspecified: Secondary | ICD-10-CM | POA: Diagnosis present

## 2018-04-04 DIAGNOSIS — D649 Anemia, unspecified: Secondary | ICD-10-CM | POA: Diagnosis present

## 2018-04-04 DIAGNOSIS — S72001A Fracture of unspecified part of neck of right femur, initial encounter for closed fracture: Secondary | ICD-10-CM | POA: Diagnosis present

## 2018-04-04 DIAGNOSIS — R188 Other ascites: Secondary | ICD-10-CM | POA: Diagnosis not present

## 2018-04-04 DIAGNOSIS — M79604 Pain in right leg: Secondary | ICD-10-CM | POA: Diagnosis not present

## 2018-04-04 DIAGNOSIS — E872 Acidosis: Secondary | ICD-10-CM | POA: Diagnosis present

## 2018-04-04 DIAGNOSIS — N179 Acute kidney failure, unspecified: Secondary | ICD-10-CM

## 2018-04-04 DIAGNOSIS — Z85840 Personal history of malignant neoplasm of eye: Secondary | ICD-10-CM

## 2018-04-04 DIAGNOSIS — K5900 Constipation, unspecified: Secondary | ICD-10-CM | POA: Diagnosis not present

## 2018-04-04 DIAGNOSIS — H547 Unspecified visual loss: Secondary | ICD-10-CM | POA: Diagnosis present

## 2018-04-04 DIAGNOSIS — E876 Hypokalemia: Secondary | ICD-10-CM | POA: Diagnosis not present

## 2018-04-04 DIAGNOSIS — D6489 Other specified anemias: Secondary | ICD-10-CM | POA: Diagnosis not present

## 2018-04-04 DIAGNOSIS — N183 Chronic kidney disease, stage 3 (moderate): Secondary | ICD-10-CM | POA: Diagnosis present

## 2018-04-04 DIAGNOSIS — I248 Other forms of acute ischemic heart disease: Secondary | ICD-10-CM

## 2018-04-04 DIAGNOSIS — Z7984 Long term (current) use of oral hypoglycemic drugs: Secondary | ICD-10-CM

## 2018-04-04 DIAGNOSIS — E11319 Type 2 diabetes mellitus with unspecified diabetic retinopathy without macular edema: Secondary | ICD-10-CM | POA: Diagnosis present

## 2018-04-04 DIAGNOSIS — Z66 Do not resuscitate: Secondary | ICD-10-CM | POA: Diagnosis present

## 2018-04-04 DIAGNOSIS — I130 Hypertensive heart and chronic kidney disease with heart failure and stage 1 through stage 4 chronic kidney disease, or unspecified chronic kidney disease: Secondary | ICD-10-CM | POA: Diagnosis present

## 2018-04-04 DIAGNOSIS — I2489 Other forms of acute ischemic heart disease: Secondary | ICD-10-CM

## 2018-04-04 DIAGNOSIS — R748 Abnormal levels of other serum enzymes: Secondary | ICD-10-CM

## 2018-04-04 DIAGNOSIS — E861 Hypovolemia: Secondary | ICD-10-CM | POA: Diagnosis present

## 2018-04-04 DIAGNOSIS — J189 Pneumonia, unspecified organism: Secondary | ICD-10-CM

## 2018-04-04 DIAGNOSIS — E1122 Type 2 diabetes mellitus with diabetic chronic kidney disease: Secondary | ICD-10-CM | POA: Diagnosis present

## 2018-04-04 DIAGNOSIS — I2510 Atherosclerotic heart disease of native coronary artery without angina pectoris: Secondary | ICD-10-CM | POA: Diagnosis not present

## 2018-04-04 DIAGNOSIS — I5032 Chronic diastolic (congestive) heart failure: Secondary | ICD-10-CM | POA: Diagnosis not present

## 2018-04-04 DIAGNOSIS — S72001D Fracture of unspecified part of neck of right femur, subsequent encounter for closed fracture with routine healing: Secondary | ICD-10-CM | POA: Diagnosis not present

## 2018-04-04 DIAGNOSIS — R7989 Other specified abnormal findings of blood chemistry: Secondary | ICD-10-CM | POA: Diagnosis present

## 2018-04-04 DIAGNOSIS — Z7982 Long term (current) use of aspirin: Secondary | ICD-10-CM

## 2018-04-04 DIAGNOSIS — R778 Other specified abnormalities of plasma proteins: Secondary | ICD-10-CM | POA: Diagnosis present

## 2018-04-04 DIAGNOSIS — M2550 Pain in unspecified joint: Secondary | ICD-10-CM | POA: Diagnosis not present

## 2018-04-04 DIAGNOSIS — S72144D Nondisplaced intertrochanteric fracture of right femur, subsequent encounter for closed fracture with routine healing: Secondary | ICD-10-CM | POA: Diagnosis not present

## 2018-04-04 DIAGNOSIS — M6281 Muscle weakness (generalized): Secondary | ICD-10-CM | POA: Diagnosis not present

## 2018-04-04 DIAGNOSIS — R402441 Other coma, without documented Glasgow coma scale score, or with partial score reported, in the field [EMT or ambulance]: Secondary | ICD-10-CM | POA: Diagnosis not present

## 2018-04-04 DIAGNOSIS — I4891 Unspecified atrial fibrillation: Secondary | ICD-10-CM | POA: Diagnosis present

## 2018-04-04 DIAGNOSIS — I480 Paroxysmal atrial fibrillation: Secondary | ICD-10-CM

## 2018-04-04 DIAGNOSIS — R278 Other lack of coordination: Secondary | ICD-10-CM | POA: Diagnosis not present

## 2018-04-04 DIAGNOSIS — R4182 Altered mental status, unspecified: Secondary | ICD-10-CM | POA: Diagnosis not present

## 2018-04-04 DIAGNOSIS — Z79899 Other long term (current) drug therapy: Secondary | ICD-10-CM

## 2018-04-04 DIAGNOSIS — E440 Moderate protein-calorie malnutrition: Secondary | ICD-10-CM | POA: Diagnosis not present

## 2018-04-04 DIAGNOSIS — I4819 Other persistent atrial fibrillation: Secondary | ICD-10-CM | POA: Diagnosis present

## 2018-04-04 DIAGNOSIS — G9341 Metabolic encephalopathy: Secondary | ICD-10-CM | POA: Diagnosis present

## 2018-04-04 DIAGNOSIS — R9431 Abnormal electrocardiogram [ECG] [EKG]: Secondary | ICD-10-CM | POA: Diagnosis not present

## 2018-04-04 DIAGNOSIS — Z950 Presence of cardiac pacemaker: Secondary | ICD-10-CM

## 2018-04-04 DIAGNOSIS — E1151 Type 2 diabetes mellitus with diabetic peripheral angiopathy without gangrene: Secondary | ICD-10-CM | POA: Diagnosis not present

## 2018-04-04 DIAGNOSIS — R6520 Severe sepsis without septic shock: Secondary | ICD-10-CM | POA: Diagnosis not present

## 2018-04-04 DIAGNOSIS — G2581 Restless legs syndrome: Secondary | ICD-10-CM | POA: Diagnosis present

## 2018-04-04 DIAGNOSIS — R509 Fever, unspecified: Secondary | ICD-10-CM | POA: Diagnosis not present

## 2018-04-04 DIAGNOSIS — Z7401 Bed confinement status: Secondary | ICD-10-CM | POA: Diagnosis not present

## 2018-04-04 DIAGNOSIS — M7989 Other specified soft tissue disorders: Secondary | ICD-10-CM | POA: Diagnosis not present

## 2018-04-04 LAB — URINALYSIS, MICROSCOPIC (REFLEX)

## 2018-04-04 LAB — CBC WITH DIFFERENTIAL/PLATELET
Abs Immature Granulocytes: 0.1 10*3/uL (ref 0.0–0.1)
BASOS PCT: 0 %
Basophils Absolute: 0 10*3/uL (ref 0.0–0.1)
EOS ABS: 0.1 10*3/uL (ref 0.0–0.7)
EOS PCT: 1 %
HEMATOCRIT: 25.3 % — AB (ref 36.0–46.0)
Hemoglobin: 7.7 g/dL — ABNORMAL LOW (ref 12.0–15.0)
Immature Granulocytes: 1 %
LYMPHS ABS: 0.6 10*3/uL — AB (ref 0.7–4.0)
Lymphocytes Relative: 5 %
MCH: 29.4 pg (ref 26.0–34.0)
MCHC: 30.4 g/dL (ref 30.0–36.0)
MCV: 96.6 fL (ref 78.0–100.0)
Monocytes Absolute: 0.2 10*3/uL (ref 0.1–1.0)
Monocytes Relative: 2 %
NEUTROS PCT: 91 %
Neutro Abs: 12 10*3/uL — ABNORMAL HIGH (ref 1.7–7.7)
PLATELETS: 332 10*3/uL (ref 150–400)
RBC: 2.62 MIL/uL — AB (ref 3.87–5.11)
RDW: 18.2 % — AB (ref 11.5–15.5)
WBC: 13 10*3/uL — ABNORMAL HIGH (ref 4.0–10.5)

## 2018-04-04 LAB — COMPREHENSIVE METABOLIC PANEL
ALBUMIN: 2.4 g/dL — AB (ref 3.5–5.0)
ALT: 17 U/L (ref 14–54)
AST: 26 U/L (ref 15–41)
Alkaline Phosphatase: 89 U/L (ref 38–126)
Anion gap: 14 (ref 5–15)
BUN: 44 mg/dL — AB (ref 6–20)
CALCIUM: 8.3 mg/dL — AB (ref 8.9–10.3)
CO2: 26 mmol/L (ref 22–32)
CREATININE: 2.15 mg/dL — AB (ref 0.44–1.00)
Chloride: 93 mmol/L — ABNORMAL LOW (ref 101–111)
GFR calc non Af Amer: 19 mL/min — ABNORMAL LOW (ref >60)
GFR, EST AFRICAN AMERICAN: 22 mL/min — AB (ref >60)
Glucose, Bld: 280 mg/dL — ABNORMAL HIGH (ref 65–99)
Potassium: 4.9 mmol/L (ref 3.5–5.1)
SODIUM: 133 mmol/L — AB (ref 135–145)
Total Bilirubin: 0.9 mg/dL (ref 0.3–1.2)
Total Protein: 6 g/dL — ABNORMAL LOW (ref 6.5–8.1)

## 2018-04-04 LAB — URINALYSIS, ROUTINE W REFLEX MICROSCOPIC
BILIRUBIN URINE: NEGATIVE
GLUCOSE, UA: NEGATIVE mg/dL
KETONES UR: NEGATIVE mg/dL
NITRITE: NEGATIVE
PROTEIN: 30 mg/dL — AB
Specific Gravity, Urine: 1.025 (ref 1.005–1.030)
pH: 5 (ref 5.0–8.0)

## 2018-04-04 LAB — I-STAT CG4 LACTIC ACID, ED: LACTIC ACID, VENOUS: 2.82 mmol/L — AB (ref 0.5–1.9)

## 2018-04-04 LAB — I-STAT TROPONIN, ED: Troponin i, poc: 0.26 ng/mL (ref 0.00–0.08)

## 2018-04-04 MED ORDER — PIPERACILLIN-TAZOBACTAM 3.375 G IVPB 30 MIN
3.3750 g | Freq: Once | INTRAVENOUS | Status: AC
  Administered 2018-04-04: 3.375 g via INTRAVENOUS
  Filled 2018-04-04: qty 50

## 2018-04-04 MED ORDER — PIPERACILLIN-TAZOBACTAM IN DEX 2-0.25 GM/50ML IV SOLN
2.2500 g | Freq: Three times a day (TID) | INTRAVENOUS | Status: DC
  Administered 2018-04-05 – 2018-04-09 (×13): 2.25 g via INTRAVENOUS
  Filled 2018-04-04 (×15): qty 50

## 2018-04-04 MED ORDER — LACTATED RINGERS IV BOLUS
1000.0000 mL | Freq: Once | INTRAVENOUS | Status: AC
  Administered 2018-04-05: 1000 mL via INTRAVENOUS

## 2018-04-04 MED ORDER — VANCOMYCIN HCL IN DEXTROSE 1-5 GM/200ML-% IV SOLN
1000.0000 mg | INTRAVENOUS | Status: DC
  Administered 2018-04-04 – 2018-04-06 (×2): 1000 mg via INTRAVENOUS
  Filled 2018-04-04 (×2): qty 200

## 2018-04-04 NOTE — ED Notes (Signed)
Date and time results received: 04/04/18  (use smartphrase ".now" to insert current time)  Test: Trop I Critical Value: 0.26  Name of Provider Notified: Dr. Francesco Sor  Orders Received? Or Actions Taken?:

## 2018-04-04 NOTE — ED Notes (Addendum)
Repeated manual BP's : 88/48, 84/42 Auto BP with cuff repositioned correctly: 98/51

## 2018-04-04 NOTE — ED Triage Notes (Signed)
Pt BIB [facility] from [facility]. Pt was there after a right hip fx and surgery. Per report patient has been less responsive over the past few days. Family requested patinet come to hospital. EMS arrival patient was hypoxic at 79% and hypotensive. Pt was recently started on a new pain medication

## 2018-04-04 NOTE — H&P (Addendum)
History and Physical   [facility] [place] [facility] [place] Admission History and Physical [employer], D.O.    Patient Name: Kylie Sanchez MR#: [id number] Date of Birth: 1925/03/03 Date of Admission: 04/04/2018  Referring MD/NP/PA: Dr. Melina Copa Primary Care Physician: Unk Pinto, MD  Chief Complaint:  Chief Complaint  Patient presents with  . Altered Mental Status  Please note the entire history is obtained from the patient's emergency department chart, emergency department provider. Patient's personal history is limited by altered mental status.   HPI: [name] is a 84 y.o. female with a known history of CKD3, HTN, HLD, PAF, DM2, recent right hip fracture s/p surgical repair presents to the emergency department for evaluation of AMS.  Patient was in a usual state of health until this past week when she has been lethargic.  Baseline is fully alert and oriented per caregiver report to EDP.  Otherwise there has been no change in status. Patient has been taking medication as prescribed and there has been no recent change in medication or diet.  No recent antibiotics.  There has been no recent illness, hospitalizations, travel or sick contacts.    EMS/ED Course: Patient received Zosyn, Vanco, fluids. Medical admission has been requested for further management of sepsis 2/2 HCAP, elevated troponin with abnormal EKG.  Review of Systems:  Unable to obtain 2/2 AMS.   Past Medical History:  Diagnosis Date  . Cancer ([place]) 2015   left eye  . Cataract   . CKD (chronic kidney disease) stage 3, GFR 30-59 ml/min (HCC)    due to DM  . Dizziness   . Hyperlipidemia   . Hypertension   . Hypertensive cardiovascular disease   . Impaired vision   . Memory loss   . Paroxysmal atrial fibrillation (HCC)   . Presence of permanent cardiac pacemaker   . Retinopathy   . Sick sinus syndrome ([place])   . Type II or unspecified type diabetes mellitus without mention of complication, not  stated as uncontrolled     Past Surgical History:  Procedure Laterality Date  . DENTAL SURGERY    . ENDARTERECTOMY Right 05/13/2017   Procedure: REDO ENDARTERECTOMY CAROTID Resection Redundant Internal Corotid Artery.;  Surgeon: Angelia Mould, MD;  Location: [place];  Service: Vascular;  Laterality: Right;  . ENDARTERECTOMY Right 05/13/2017   Procedure: ENDARTERECTOMY CAROTID-RIGHT;  Surgeon: Serafina Mitchell, MD;  Location[facility];  Service: Vascular;  Laterality: Right;  . EYE SURGERY Left    tumor and eye removed  . INTRAMEDULLARY (IM) NAIL INTERTROCHANTERIC Right 03/23/2018   Procedure: INTRAMEDULLARY (IM) NAIL RIGHT INTERTROCHANTRIC;  Surgeon: Mcarthur Rossetti, MD;  Location: [place];  Service: Orthopedics;  Laterality: Right;  . PACEMAKER INSERTION  04/20/12   MDT Adapta L implanted by Dr Rayann Heman  . PATCH ANGIOPLASTY Right 05/13/2017   Procedure: PATCH ANGIOPLASTY USING Rueben Bash BIOLOGIC PATCH;  Surgeon: Serafina Mitchell, MD;  Location: [facility] OR;  Service: Vascular;  Laterality: Right;  . PERMANENT PACEMAKER INSERTION N/A 04/20/2012   Procedure: PERMANENT PACEMAKER INSERTION;  Surgeon: Thompson Grayer, MD;  Location[facility] C[facility] LAB;  Service: Cardiovascular;  Laterality: N/A;     reports that she has never smoked. She has never used smokeless tobacco. She reports that she does not drink alcohol or use drugs.  Allergies  Allergen Reactions  . No Known Allergies     Family History  Problem Relation Age of Onset  . Heart attack Father   . Heart disease Father   .  Hypertension Mother   . Heart disease Mother   . Cancer Other     Prior to Admission medications   Medication Sig Start Date End Date Taking? Authorizing Provider  aspirin EC 81 MG EC tablet Take 1 tablet (81 mg total) by mouth daily. 05/24/17   Alvia Grove, PA-C  atorvastatin (LIPITOR) 40 MG tablet Take 1 tablet (40 mg total) by mouth daily at 6 PM. 10/28/17   Unk Pinto, MD  b complex vitamins tablet Take 1  tablet by mouth daily.    [provider]  blood glucose meter kit and supplies Test sugars once daily. Dispense based insurance preference. E11.9 11/20/17   Vicie Mutters, PA-C  Cholecalciferol (VITAMIN D PO) Take 1 tablet by mouth every evening.     [provider]  Coenzyme Q10 (CO Q 10) 100 MG CAPS Take 100 mg by mouth every evening.     [provider]  escitalopram (LEXAPRO) 10 MG tablet TAKE ONE (1) TABLET BY MOUTH EVERY DAY Patient taking differently: Take 5 mg by mouth daily.  11/17/17   Vicie Mutters, PA-C  furosemide (LASIX) 20 MG tablet Take 1 tablet daily for BP & Fluid 02/23/18   Unk Pinto, MD  glimepiride (AMARYL) 2 MG tablet TAKE ONE (1) TABLET BY MOUTH EVERY DAY 11/05/17   Unk Pinto, MD  glucose blood test strip Check sugars once to twice a day for diabetes E11.21 11/17/17   Vicie Mutters, PA-C  HYDROcodone-acetaminophen (NORCO/VICODIN) 5-325 MG tablet Take 1-2 tablets by mouth every 4 (four) hours as needed for moderate pain (pain score 4-6). 03/25/18   Mcarthur Rossetti, MD  metFORMIN (GLUCOPHAGE-XR) 500 MG 24 hr tablet Take 1 tablet (500 mg total) by mouth every morning. Take with meal for diabetes 11/05/17   Unk Pinto, MD  metoprolol succinate (TOPROL-XL) 100 MG 24 hr tablet TAKE ONE (1) TABLET BY MOUTH TWO (2) TIMES DAILY WITH OR IMMEDIATELY FOLLOWING A MEAL Patient taking differently: Take 100 mg by mouth 2 (two) times daily.  10/30/17   Allred, Jeneen Rinks, MD  nitroGLYCERIN (NITROSTAT) 0.4 MG SL tablet Place 1 tablet (0.4 mg total) under the tongue every 5 (five) minutes as needed for chest pain. 10/30/17 01/28/19  Allred, Jeneen Rinks, MD  warfarin (COUMADIN) 4 MG tablet Take as directed by [facility] Patient taking differently: Take 2-4 mg by mouth See admin instructions. Take 1 tablet (4 mg totally) by mouth on Mon, Wed, Thur, Fri; take 0.5 tablet (2 mg totally) by mouth on Tues and Sat. 10/30/17   Thompson Grayer, MD    Physical  Exam: Vitals:   04/04/18 2105 04/04/18 2130 04/04/18 2145 04/04/18 2200  BP: (!) 100/48     Pulse:  88 89 93  Resp:  17 (!) 22 19  Temp:      TempSrc:      SpO2:  94% 95% 96%  Height:        GENERAL: 84 y.o.-year-old female patient, pale lying in the bed in no acute distress.  Awake, mumbling incomprehensibly.  Not able to follow commands.  HEENT: Head atraumatic, normocephalic. Pupils equal. Mucus membranes dry. Left eye enucleated.  NECK: Supple. No JVD. CHEST: Normal breath sounds bilaterally. No wheezing, rales, rhonchi or crackles. No use of accessory muscles of respiration.  No reproducible chest wall tenderness.  CARDIOVASCULAR: S1, S2 normal. No murmurs, rubs, or gallops. Cap refill <2 seconds. Pulses intact distally.  ABDOMEN: Soft, nondistended, nontender. No rebound, guarding, rigidity. Normoactive bowel sounds present  in all four quadrants.  EXTREMITIES: Right hip wound clean and dry. No pedal edema, cyanosis, or clubbing. No calf tenderness or Homan's sign.  NEUROLOGIC: The patient is awake but not conversive. SKIN: Warm, dry, and intact without obvious rash, lesion, or ulcer.    Labs on Admission:  CBC: Recent Labs  Lab 04/04/18 2124  WBC 13.0*  NEUTROABS 12.0*  HGB 7.7*  HCT 25.3*  MCV 96.6  PLT 425   Basic Metabolic Panel: Recent Labs  Lab 04/04/18 2124  NA 133*  K 4.9  CL 93*  CO2 26  GLUCOSE 280*  BUN 44*  CREATININE 2.15*  CALCIUM 8.3*   GFR: Estimated Creatinine Clearance: 15.2 mL/min (A) (by C-G formula based on SCr of 2.15 mg/dL (H)). Liver Function Tests: Recent Labs  Lab 04/04/18 2124  AST 26  ALT 17  ALKPHOS 89  BILITOT 0.9  PROT 6.0*  ALBUMIN 2.4*   No results for input(s): LIPASE, AMYLASE in the last 168 hours. No results for input(s): AMMONIA in the last 168 hours. Coagulation Profile: No results for input(s): INR, PROTIME in the last 168 hours. Cardiac Enzymes: No results for input(s): CKTOTAL, CKMB, CKMBINDEX, TROPONINI  in the last 168 hours. BNP (last 3 results) No results for input(s): PROBNP in the last 8760 hours. HbA1C: No results for input(s): HGBA1C in the last 72 hours. CBG: No results for input(s): GLUCAP in the last 168 hours. Lipid Profile: No results for input(s): CHOL, HDL, LDLCALC, TRIG, CHOLHDL, LDLDIRECT in the last 72 hours. Thyroid Function Tests: No results for input(s): TSH, T4TOTAL, FREET4, T3FREE, THYROIDAB in the last 72 hours. Anemia Panel: No results for input(s): VITAMINB12, FOLATE, FERRITIN, TIBC, IRON, RETICCTPCT in the last 72 hours. Urine analysis:    Component Value Date/Time   COLORURINE YELLOW 04/04/2018 2113   APPEARANCEUR CLOUDY (A) 04/04/2018 2113   LABSPEC 1.025 04/04/2018 2113   PHURINE 5.0 04/04/2018 2113   GLUCOSEU NEGATIVE 04/04/2018 2113   HGBUR MODERATE (A) 04/04/2018 2113   BILIRUBINUR NEGATIVE 04/04/2018 2113   [place] NEGATIVE 04/04/2018 2113   PROTEINUR 30 (A) 04/04/2018 2113   UROBILINOGEN 0.2 02/10/2015 1034   NITRITE NEGATIVE 04/04/2018 2113   LEUKOCYTESUR SMALL (A) 04/04/2018 2113   Sepsis Labs: @LABRCNTIP (procalcitonin:4,lacticidven:4) )No results found for this or any previous visit (from the past 240 hour(s)).   Radiological Exams on Admission: Dg Chest Port 1 View  Result Date: 04/04/2018 CLINICAL DATA:  Fever and decreased responsiveness EXAM: PORTABLE CHEST 1 VIEW COMPARISON:  Mar 22, 2018 FINDINGS: There is patchy infiltrate in both lung bases. Heart is upper normal in size with pulmonary vascularity normal. There is aortic atherosclerosis. Pacemaker leads are attached the right atrium and right ventricle. Bones osteoporotic. No adenopathy. IMPRESSION: Patchy infiltrate felt to represent pneumonia in both lung bases. Lungs elsewhere clear. Stable cardiac silhouette. There is aortic atherosclerosis. Aortic Atherosclerosis (ICD10-I70.0). Electronically Signed   By: Lowella Grip III M.D.   On: 04/04/2018 21:30   Dg Hip Unilat With  Pelvis 2-3 Views Right  Result Date: 04/04/2018 CLINICAL DATA:  Recent right hip ORIF EXAM: DG HIP (WITH OR WITHOUT PELVIS) 2-3V RIGHT COMPARISON:  03/22/2018 FINDINGS: Antegrade intramedullary nail with interlocking femoral neck screw without abnormal perihardware lucency. Fracture site is in anatomic alignment. No acute fracture. IMPRESSION: Right femoral ORIF hardware without adverse features. Electronically Signed   By: Ulyses Jarred M.D.   On: 04/04/2018 22:18    EKG: Normal sinus rhythm at 94 bpm with normal axis, TWI in  I, II.. ST depression in V3-V6 and nonspecific ST-T wave changes.   Assessment/Plan  This is a 84 y.o. female with a history of CKD3, HTN, HLD, PAF, DM2 now being admitted with:  #. Sepsis secondary to HCAP - Admit to inpatient with telemetry monitoring - IV antibiotics: Zosyn, Vanco - IV fluid hydration - Follow up blood,urine & sputum cultures - Repeat CBC in am.   #. Elevated troponin with abnormal EKG - Trend troponins, check lipids and TSH. - Morphine, nitro, beta blocker, aspirin and statin ordered.   - Cardiology consultation has been requested by EDP.  #. AKI on CKD - IV fluids and repeat BMP in AM.  - Avoid nephrotoxic medications  #. Anemia - Monitor CBC  #. H/o Diabetes - Accuchecks q4h with RISS coverage  Admission status: Inpatient, tele IV Fluids: NS Diet/Nutrition: NPO for now Consults called: Cardiology  DVT Px: Lovenox, SCDs and early ambulation. Code Status: DNR Disposition Plan: To home in 2-3 days  All the records are reviewed and case discussed with ED provider. Management plans discussed with the patient and/or family who express understanding and agree with plan of care.  Beatrix Breece D.O. on 04/04/2018 at 10:43 PM CC: Primary care physician; Unk Pinto, MD   04/04/2018, 10:43 PM

## 2018-04-04 NOTE — Progress Notes (Signed)
ANTIBIOTIC CONSULT NOTE - INITIAL  Pharmacy Consult for Zosyn and Vancomycin Indication: Sepsis  Allergies  Allergen Reactions  . No Known Allergies     Patient Measurements: Height: 5\' 2"  (157.5 cm) IBW/kg (Calculated) : 50.1 Adjusted Body Weight: 63.5 kg  Vital Signs: Temp: 102.8 F (39.3 C) (05/25 2105) Temp Source: Oral (05/25 2105) BP: 100/48 (05/25 2105) Pulse Rate: 93 (05/25 2200) Intake/Output from previous day: No intake/output data recorded. Intake/Output from this shift: No intake/output data recorded.  Labs: Recent Labs    04/04/18 2124  WBC 13.0*  HGB 7.7*  PLT 332  CREATININE 2.15*   Estimated Creatinine Clearance: 15.2 mL/min (A) (by C-G formula based on SCr of 2.15 mg/dL (H)). No results for input(s): VANCOTROUGH, VANCOPEAK, VANCORANDOM, GENTTROUGH, GENTPEAK, GENTRANDOM, TOBRATROUGH, TOBRAPEAK, TOBRARND, AMIKACINPEAK, AMIKACINTROU, AMIKACIN in the last 72 hours.   Microbiology: Recent Results (from the past 720 hour(s))  Urine culture     Status: Abnormal   Collection Time: 03/22/18  4:30 AM  Result Value Ref Range Status   Specimen Description   Final    URINE, CATHETERIZED Performed at [facility], [address], [place], [place] [address]    Special Requests NONE  Final   Culture >=100,000 COLONIES/mL KLEBSIELLA PNEUMONIAE (A)  Final   Report Status 03/24/2018 FINAL  Final   Organism ID, Bacteria KLEBSIELLA PNEUMONIAE (A)  Final      Susceptibility   Klebsiella pneumoniae - MIC*    AMPICILLIN RESISTANT Resistant     CEFAZOLIN <=4 SENSITIVE Sensitive     CEFTRIAXONE <=1 SENSITIVE Sensitive     CIPROFLOXACIN <=0.25 SENSITIVE Sensitive     GENTAMICIN <=1 SENSITIVE Sensitive     IMIPENEM <=0.25 SENSITIVE Sensitive     NITROFURANTOIN 64 INTERMEDIATE Intermediate     TRIMETH/SULFA <=20 SENSITIVE Sensitive     AMPICILLIN/SULBACTAM 4 SENSITIVE Sensitive     PIP/TAZO <=4 SENSITIVE Sensitive     Extended ESBL NEGATIVE Sensitive     *  >=100,000 COLONIES/mL KLEBSIELLA PNEUMONIAE  MRSA PCR Screening     Status: Abnormal   Collection Time: 03/23/18  2:57 AM  Result Value Ref Range Status   MRSA by PCR POSITIVE (A) NEGATIVE Final    Comment:        The GeneXpert MRSA Assay (FDA approved for NASAL specimens only), is one component of a comprehensive MRSA colonization surveillance program. It is not intended to diagnose MRSA infection nor to guide or monitor treatment for MRSA infections. RESULT CALLED TO, READ BACK BY AND VERIFIED WITH: H. Laurence Compton [id number] 03/23/2018 Mena Goes Performed at [facility], [place] [address] [place], [place] [address]     Medical History: Past Medical History:  Diagnosis Date  . Cancer ([place]) 2015   left eye  . Cataract   . CKD (chronic kidney disease) stage 3, GFR 30-59 ml/min (HCC)    due to DM  . Dizziness   . Hyperlipidemia   . Hypertension   . Hypertensive cardiovascular disease   . Impaired vision   . Memory loss   . Paroxysmal atrial fibrillation (HCC)   . Presence of permanent cardiac pacemaker   . Retinopathy   . Sick sinus syndrome ([place])   . Type II or unspecified type diabetes mellitus without mention of complication, not stated as uncontrolled      Assessment: 84 yo female with sepsis. Pharmacy asked to dose zosyn and vancomycin for sepsis. CrCL</= 73ml/min.   Goal of Therapy:  Vancomycin troughs 15-20  mcg/ml  Plan:  Vancomycin 1000mg  IV q48h Zosyn 2.25gm IV q8h Vancomycin troughs as needed Monitor fever curve, clinical course, CBC, and renal function    Vernona Peake A. Levada Dy, PharmD, [place] Pager: [phone]  04/04/2018,10:15 PM

## 2018-04-04 NOTE — Consult Note (Signed)
Cardiology Consultation:   Patient ID: Kylie Sanchez; [id number]; [birth year]   Admit date: 04/04/2018 Date of Consult: 04/05/2018  Primary Care Provider: Unk Pinto, MD Primary Cardiologist: Allred   Patient Profile:   Kylie Sanchez is a 84 y.o. 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 Melina Copa.  History of Present Illness:   Kylie Sanchez is a 84 y.o. 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  The patient was recently hospitalized for hip fracture. She was evaluated by cardiology preoperatively. Recent echocardiogram had shown normal EF without any wall motion abnormalities. Further workup was not recommended prior to surgery. She underwent successful hip surgery but required pRBC transfusion postoperatively for hypotension and ABLA. She was discharged on 5/17.   She presents back to the ED today from SNF with altered mental status. In the ED she is febrile to 102.8 with HR 90s and SBP 90-100. Labs showed WBC 13, Hgb 7.7 (last 8.3), Cr 2.15 (baseline 1.6), lactate 2.8, troponin 0.26. ECG showed NSR with diffuse ST depressions. CXR showed infiltrate suggestive of PNA. She was started on IV antibiotics and admitted to the hospitalist service. Cardiology was consulted for positive troponin.   On my evaluation, history from the patient is very limited due to confusion; however, her caregiver is present. The caregiver reports that the  patient has had progressive confusion over the past several days. The patient has had ongoing hip pain, but the caregiver was unable to elucidate any chest pain or dyspnea or other cardiac symptoms. On my guided questioning, the patient endorses some dyspnea but no chest pain. She has ongoing hip pain.    Past Medical History:  Diagnosis Date  . Cancer ([place]) 2015   left eye  . Cataract   . CKD (chronic kidney disease) stage 3, GFR 30-59 ml/min (HCC)    due to DM  . Dizziness   . Hyperlipidemia   . Hypertension   . Hypertensive cardiovascular disease   . Impaired vision   . Memory loss   . Paroxysmal atrial fibrillation ([facility])   . Presence of permanent cardiac pacemaker   . Retinopathy   . Sick sinus syndrome ([place])   . Type II or unspecified type diabetes mellitus without mention of complication, not stated as uncontrolled     Past Surgical History:  Procedure Laterality Date  . DENTAL SURGERY    . ENDARTERECTOMY Right 05/13/2017   Procedure: REDO ENDARTERECTOMY CAROTID Resection Redundant Internal Corotid Artery.;  Surgeon: Angelia Mould, MD;  Location: [facility];  Service: Vascular;  Laterality: Right;  . ENDARTERECTOMY Right 05/13/2017   Procedure: ENDARTERECTOMY CAROTID-RIGHT;  Surgeon: Serafina Mitchell, MD;  Location: [facility] OR;  Service: Vascular;  Laterality: Right;  . EYE SURGERY Left    tumor and eye removed  . INTRAMEDULLARY (IM) NAIL INTERTROCHANTERIC Right 03/23/2018   Procedure: INTRAMEDULLARY (IM) NAIL RIGHT INTERTROCHANTRIC;  Surgeon: Mcarthur Rossetti, MD;  Location: [facility];  Service: Orthopedics;  Laterality:  Right;  Marland Kitchen PACEMAKER INSERTION  04/20/12   MDT Adapta L implanted by Dr Rayann Heman  . PATCH ANGIOPLASTY Right 05/13/2017   Procedure: PATCH ANGIOPLASTY USING Rueben Bash BIOLOGIC PATCH;  Surgeon: Serafina Mitchell, MD;  Location: [facility] OR;  Service: Vascular;  Laterality: Right;  . PERMANENT PACEMAKER INSERTION N/A 04/20/2012   Procedure: PERMANENT PACEMAKER INSERTION;   Surgeon: Thompson Grayer, MD;  Location: [facility] CATH LAB;  Service: Cardiovascular;  Laterality: N/A;     Home Medications:  Prior to Admission medications   Medication Sig Start Date End Date Taking? Authorizing Provider  aspirin EC 81 MG EC tablet Take 1 tablet (81 mg total) by mouth daily. 05/24/17   Alvia Grove, PA-C  atorvastatin (LIPITOR) 40 MG tablet Take 1 tablet (40 mg total) by mouth daily at 6 PM. 10/28/17   Unk Pinto, MD  b complex vitamins tablet Take 1 tablet by mouth daily.    [provider]  blood glucose meter kit and supplies Test sugars once daily. Dispense based insurance preference. E11.9 11/20/17   Vicie Mutters, PA-C  Cholecalciferol (VITAMIN D PO) Take 1 tablet by mouth every evening.     [provider]  Coenzyme Q10 (CO Q 10) 100 MG CAPS Take 100 mg by mouth every evening.     [provider]  escitalopram (LEXAPRO) 10 MG tablet TAKE ONE (1) TABLET BY MOUTH EVERY DAY Patient taking differently: Take 5 mg by mouth daily.  11/17/17   Vicie Mutters, PA-C  furosemide (LASIX) 20 MG tablet Take 1 tablet daily for BP & Fluid 02/23/18   Unk Pinto, MD  glimepiride (AMARYL) 2 MG tablet TAKE ONE (1) TABLET BY MOUTH EVERY DAY 11/05/17   Unk Pinto, MD  glucose blood test strip Check sugars once to twice a day for diabetes E11.21 11/17/17   Vicie Mutters, PA-C  HYDROcodone-acetaminophen (NORCO/VICODIN) 5-325 MG tablet Take 1-2 tablets by mouth every 4 (four) hours as needed for moderate pain (pain score 4-6). 03/25/18   Mcarthur Rossetti, MD  metFORMIN (GLUCOPHAGE-XR) 500 MG 24 hr tablet Take 1 tablet (500 mg total) by mouth every morning. Take with meal for diabetes 11/05/17   Unk Pinto, MD  metoprolol succinate (TOPROL-XL) 100 MG 24 hr tablet TAKE ONE (1) TABLET BY MOUTH TWO (2) TIMES DAILY WITH OR IMMEDIATELY FOLLOWING A MEAL Patient taking differently: Take 100 mg by mouth 2 (two) times daily.  10/30/17   Allred, Jeneen Rinks, MD   nitroGLYCERIN (NITROSTAT) 0.4 MG SL tablet Place 1 tablet (0.4 mg total) under the tongue every 5 (five) minutes as needed for chest pain. 10/30/17 01/28/19  Allred, Jeneen Rinks, MD  warfarin (COUMADIN) 4 MG tablet Take as directed by [facility] Patient taking differently: Take 2-4 mg by mouth See admin instructions. Take 1 tablet (4 mg totally) by mouth on Mon, Wed, Thur, Fri; take 0.5 tablet (2 mg totally) by mouth on Tues and Sat. 10/30/17   Allred, Jeneen Rinks, MD    Inpatient Medications: Scheduled Meds: . aspirin EC  81 mg Oral Daily  . atorvastatin  40 mg Oral q1800  . escitalopram  5 mg Oral Daily   Continuous Infusions: . sodium chloride    . piperacillin-tazobactam (ZOSYN)  IV    . vancomycin Stopped (04/04/18 2323)   PRN Meds:   Allergies:    Allergies  Allergen Reactions  . No Known Allergies     Social History:   Social History   Socioeconomic History  . Marital status: Single  Spouse name: Not on file  . Number of children: 0  . Years of education: 11 years  . Highest education level: High school graduate  Occupational History  . Occupation: Retired  Scientific laboratory technician  . Financial resource strain: Not on file  . Food insecurity:    Worry: Not on file    Inability: Not on file  . Transportation needs:    Medical: Not on file    Non-medical: Not on file  Tobacco Use  . Smoking status: Never Smoker  . Smokeless tobacco: Never Used  Substance and Sexual Activity  . Alcohol use: No  . Drug use: No  . Sexual activity: Not on file  Lifestyle  . Physical activity:    Days per week: Not on file    Minutes per session: Not on file  . Stress: Not on file  Relationships  . Social connections:    Talks on phone: Not on file    Gets together: Not on file    Attends religious service: Not on file    Active member of club or organization: Not on file    Attends meetings of clubs or organizations: Not on file    Relationship status: Not on file  . Intimate partner  violence:    Fear of current or ex partner: Not on file    Emotionally abused: Not on file    Physically abused: Not on file    Forced sexual activity: Not on file  Other Topics Concern  . Not on file  Social History Narrative   Lives at home with her brother.  She has a caregiver in the home from 9am to 6:30pm.   Left-handed.   2 cups caffeine per day.    Family History:    Family History  Problem Relation Age of Onset  . Heart attack Father   . Heart disease Father   . Hypertension Mother   . Heart disease Mother   . Cancer Other      ROS:  ROS limited due to confusion but is reported in HPI and otherwise negative.   Physical Exam/Data:   Vitals:   04/04/18 2356 04/04/18 2358 04/05/18 0000 04/05/18 0048  BP: (!) 84/42  (!) 104/59 96/60  Pulse:  86 84 88  Resp:  _0 Temp:    98.3 F (36.8 C)  TempSrc:    Oral  SpO2:  100% 100% 99%  Weight:    75.8 kg (167 lb)  Height:        Intake/Output Summary (Last 24 hours) at 04/05/2018 0125 Last data filed at 04/05/2018 0028 Gross per 24 hour  Intake 250 ml  Output 300 ml  Net -50 ml   Filed Weights   04/05/18 0048  Weight: 75.8 kg (167 lb)   Body mass index is 30.54 kg/m.  General:  Chronically ill appearing HEENT: Patch over L eye Lymph: no adenopathy Neck: no JVD Cardiac:  normal S1, S2; RRR; no murmur   Lungs:  Normal WOB with some coarse breath sounds Abd: soft, nontender, no hepatomegaly  Ext: no edema Musculoskeletal:  Pain with palpation of R hip Skin: warm and dry  Neuro:  No focal abnormalities noted although has poor effort Psych:  Confused and oriented to self. Answers some questions appropriately and follows commands inconsistently  EKG:  The EKG was personally reviewed and demonstrates:  NSR with diffuse ST depressions Telemetry:  Telemetry was personally reviewed and demonstrates:  NSR with HR 80s.  Some episodes of AF  Relevant CV Studies: Relevant CV Studies:  Echocardiogram:  06/2017 - Left ventricle: The cavity size was normal. Wall thickness was normal. Systolic function was normal. The estimated ejection fraction was in the range of 60% to 65%. Wall motion was normal; there were no regional wall motion abnormalities. Doppler parameters are consistent with abnormal left ventricular relaxation (grade 1 diastolic dysfunction). The E/e&' ratio is between 8-15, suggesting indeterminate LV filling pressure. - Mitral valve: Mildly thickened leaflets . There was mild regurgitation. - Left atrium: Moderately dilated. - Tricuspid valve: There was mild regurgitation. - Pulmonary arteries: PA peak pressure: 34 mm Hg (S). - Inferior vena cava: The vessel was normal in size. The respirophasic diameter changes were in the normal range (= 50%), consistent with normal central venous pressure. Impressions: - Compared to a prior study in 05/2017, the LVEF has normalized   Laboratory Data:  Chemistry Recent Labs  Lab 04/04/18 2124  NA 133*  K 4.9  CL 93*  CO2 26  GLUCOSE 280*  BUN 44*  CREATININE 2.15*  CALCIUM 8.3*  GFRNONAA 19*  GFRAA 22*  ANIONGAP 14    Recent Labs  Lab 04/04/18 2124  PROT 6.0*  ALBUMIN 2.4*  AST 26  ALT 17  ALKPHOS 89  BILITOT 0.9   Hematology Recent Labs  Lab 04/04/18 2124  WBC 13.0*  RBC 2.62*  HGB 7.7*  HCT 25.3*  MCV 96.6  MCH 29.4  MCHC 30.4  RDW 18.2*  PLT 332   Cardiac EnzymesNo results for input(s): TROPONINI in the last 168 hours.  Recent Labs  Lab 04/04/18 2155  TROPIPOC 0.26*    BNPNo results for input(s): BNP, PROBNP in the last 168 hours.  DDimer No results for input(s): DDIMER in the last 168 hours.  Radiology/Studies:  Dg Chest Port 1 View  Result Date: 04/04/2018 CLINICAL DATA:  Fever and decreased responsiveness EXAM: PORTABLE CHEST 1 VIEW COMPARISON:  Mar 22, 2018 FINDINGS: There is patchy infiltrate in both lung bases. Heart is upper normal in size with pulmonary vascularity  normal. There is aortic atherosclerosis. Pacemaker leads are attached the right atrium and right ventricle. Bones osteoporotic. No adenopathy. IMPRESSION: Patchy infiltrate felt to represent pneumonia in both lung bases. Lungs elsewhere clear. Stable cardiac silhouette. There is aortic atherosclerosis. Aortic Atherosclerosis (ICD10-I70.0). Electronically Signed   By: Lowella Grip III M.D.   On: 04/04/2018 21:30   Dg Hip Unilat With Pelvis 2-3 Views Right  Result Date: 04/04/2018 CLINICAL DATA:  Recent right hip ORIF EXAM: DG HIP (WITH OR WITHOUT PELVIS) 2-3V RIGHT COMPARISON:  03/22/2018 FINDINGS: Antegrade intramedullary nail with interlocking femoral neck screw without abnormal perihardware lucency. Fracture site is in anatomic alignment. No acute fracture. IMPRESSION: Right femoral ORIF hardware without adverse features. Electronically Signed   By: Ulyses Jarred M.D.   On: 04/04/2018 22:18    Assessment and Plan:   Elevated troponin Abnormal ECG The patient was recently admitted for hip surgery and presents with confusion and acute infection likely due to PNA. She also has worsening anemia and significant AKI and lactic acidosis. Cardiology is consulted for mild troponin elevation and ECG changes. History is limited but she does not endorse chest pain. Suspect these cardiac changes to be due to demand ischemia in the setting of her infectious, metabolic, and hematologic insults. Given her known carotid disease along with her age and risk factors, she likely has underlying CAD. However, ACS event is less likely to be the cause  of her findings. She would benefit from ongoing management of her acute and chronic medical insults.   -Continue management of infection, anemia, AKI and other medical issues. -Continue to trend troponin and ECGs -Would avoid heparin at this time given acute on chronic anemia unless clinical trajectory becomes more consistent with ACS -Continue home ASA, atorvastatin       Paroxysmal Atrial Fibrillation Currently in sinus with some breakthrough AF -Can hold  metoprolol in the setting of sepsis and hypotension. Can restart as BP allows -Can hold anticoagulation until anemia further trended and stabilized  SSS - s/p Medtronic PPM placement in 2013. - followed by Dr. Rayann Heman as an outpatient.   History of Tachycardia-mediated Cardiomyopathy EF 35% by echo in 05/2017, improved to 60-65% by repeat imaging in 06/2017. She does not appear hypervolemic on exam -Can hold  metoprolol in the setting of sepsis and hypotension. Can restart as BP allows - Would hold on diuresis given AKI and sepsis with likely hypovolemia.    For questions or updates, please contact [facility] Please consult [email or web address] for contact info under Cardiology/STEMI.   Signed, Nila Nephew, MD  04/05/2018 1:25 AM

## 2018-04-04 NOTE — ED Notes (Signed)
Date and time results received: 04/04/18  (use smartphrase ".now" to insert current time)  Test: Lactic Acid Critical Value: 2.82  Name of Provider Notified: Dr. Melina Copa  Orders Received  No Or Actions Taken?:

## 2018-04-04 NOTE — ED Provider Notes (Signed)
[facility] EMERGENCY DEPARTMENT Provider Note   CSN: [id number] Arrival date & time: 04/04/18  2055     History   Chief Complaint Chief Complaint  Patient presents with  . Altered Mental Status    HPI Kylie Sanchez is a 84 y.o. female.  HPI Patient is a 84 year old female with history as below, notable for recent right hip fracture status post repair, who presents from her rehab facility due to altered mental status.  The patient is altered and unable to contribute to history here, thus history obtained from a caregiver.  Caregiver reports that she has been somewhat altered all week.  She has been lethargic every time she gets pain medications.  However, over the last 2 days, her mental status has continued to deteriorate.  The caregiver was unaware the patient was febrile until she was told here.  She reports the patient has not been getting up out of the bed or trying to participate in rehab.  Higher to her recent fall and hip fracture, the patient lived at home and walked with the assistance of a walker.  Her baseline mental status is fully alert and oriented.  As far as the caregiver notes, the patient has not had any recent complaints.  Past Medical History:  Diagnosis Date  . Cancer ([place]) 2015   left eye  . Cataract   . CKD (chronic kidney disease) stage 3, GFR 30-59 ml/min (HCC)    due to DM  . Dizziness   . Hyperlipidemia   . Hypertension   . Hypertensive cardiovascular disease   . Impaired vision   . Memory loss   . Paroxysmal atrial fibrillation (HCC)   . Presence of permanent cardiac pacemaker   . Retinopathy   . Sick sinus syndrome ([place])   . Type II or unspecified type diabetes mellitus without mention of complication, not stated as uncontrolled     Patient Active Problem List   Diagnosis Date Noted  . Sepsis due to pneumonia ([place]) 04/04/2018  . Acute lower UTI 03/24/2018  . Closed right hip fracture, initial encounter ([place]) 03/22/2018  . Hip  fracture ([place]) 03/22/2018  . Closed fracture of right hip ([place])   . Warfarin-induced coagulopathy ([place])   . Mild cognitive impairment 01/22/2018  . Afib ([place]) 11/17/2017  . Depression, major, recurrent, in partial remission ([place]) 11/17/2017  . Palliative care encounter   . Goals of care, counseling/discussion   . Pressure injury of skin 05/22/2017  . Dilated cardiomyopathy ([place]) 05/20/2017  . Elevated troponin- unclear etiology 05/19/2017  . Asymptomatic stenosis of right carotid artery 05/13/2017  . H/O enucleation of left eyeball 10/30/2015  . Macular degeneration, right eye 10/30/2015  . Pseudophakia, right eye 10/30/2015  . Basal cell carcinoma 02/10/2015  . DM neuropathy, type II diabetes mellitus ([place]) 02/10/2015  . Persistent atrial fibrillation ([place]) 02/03/2015  . Type 2 diabetes mellitus with hyperlipidemia ([place]) 11/24/2014  . T2_NIDDM w/Stage 3 CKD (GFR 40 ml/min) 11/08/2014  . Vitamin D deficiency 11/08/2014  . Medication management 11/08/2014  . BP (high blood pressure) 11/08/2014  . Hyperlipidemia   . Sick sinus syndrome ([place])   . Retinopathy   . Long term current use of anticoagulant therapy 04/30/2012  . Tachycardia-bradycardia ([place]) 04/19/2012  . HCD (hypertensive cardiovascular disease) 04/18/2011    Past Surgical History:  Procedure Laterality Date  . DENTAL SURGERY    . ENDARTERECTOMY Right 05/13/2017   Procedure: REDO ENDARTERECTOMY CAROTID Resection Redundant Internal Corotid Artery.;  Surgeon:  Angelia Mould, MD;  Location: [facility] OR;  Service: Vascular;  Laterality: Right;  . ENDARTERECTOMY Right 05/13/2017   Procedure: ENDARTERECTOMY CAROTID-RIGHT;  Surgeon: Serafina Mitchell, MD;  Location: [facility] OR;  Service: Vascular;  Laterality: Right;  . EYE SURGERY Left    tumor and eye removed  . INTRAMEDULLARY (IM) NAIL INTERTROCHANTERIC Right 03/23/2018   Procedure: INTRAMEDULLARY (IM) NAIL RIGHT INTERTROCHANTRIC;  Surgeon: Mcarthur Rossetti, MD;  Location:  [place];  Service: Orthopedics;  Laterality: Right;  . PACEMAKER INSERTION  04/20/12   MDT Adapta L implanted by Dr Rayann Heman  . PATCH ANGIOPLASTY Right 05/13/2017   Procedure: PATCH ANGIOPLASTY USING Rueben Bash BIOLOGIC PATCH;  Surgeon: Serafina Mitchell, MD;  Location: [facility] OR;  Service: Vascular;  Laterality: Right;  . PERMANENT PACEMAKER INSERTION N/A 04/20/2012   Procedure: PERMANENT PACEMAKER INSERTION;  Surgeon: Thompson Grayer, MD;  Location: [facility] CATH LAB;  Service: Cardiovascular;  Laterality: N/A;     OB History   None      Home Medications    Prior to Admission medications   Medication Sig Start Date End Date Taking? Authorizing Provider  aspirin EC 81 MG EC tablet Take 1 tablet (81 mg total) by mouth daily. 05/24/17   Alvia Grove, PA-C  atorvastatin (LIPITOR) 40 MG tablet Take 1 tablet (40 mg total) by mouth daily at 6 PM. 10/28/17   Unk Pinto, MD  b complex vitamins tablet Take 1 tablet by mouth daily.    [provider]  blood glucose meter kit and supplies Test sugars once daily. Dispense based insurance preference. E11.9 11/20/17   Vicie Mutters, PA-C  Cholecalciferol (VITAMIN D PO) Take 1 tablet by mouth every evening.     [provider]  Coenzyme Q10 (CO Q 10) 100 MG CAPS Take 100 mg by mouth every evening.     [provider]  escitalopram (LEXAPRO) 10 MG tablet TAKE ONE (1) TABLET BY MOUTH EVERY DAY Patient taking differently: Take 5 mg by mouth daily.  11/17/17   Vicie Mutters, PA-C  furosemide (LASIX) 20 MG tablet Take 1 tablet daily for BP & Fluid 02/23/18   Unk Pinto, MD  glimepiride (AMARYL) 2 MG tablet TAKE ONE (1) TABLET BY MOUTH EVERY DAY 11/05/17   Unk Pinto, MD  glucose blood test strip Check sugars once to twice a day for diabetes E11.21 11/17/17   Vicie Mutters, PA-C  HYDROcodone-acetaminophen (NORCO/VICODIN) 5-325 MG tablet Take 1-2 tablets by mouth every 4 (four) hours as needed for moderate pain (pain score 4-6).  03/25/18   Mcarthur Rossetti, MD  metFORMIN (GLUCOPHAGE-XR) 500 MG 24 hr tablet Take 1 tablet (500 mg total) by mouth every morning. Take with meal for diabetes 11/05/17   Unk Pinto, MD  metoprolol succinate (TOPROL-XL) 100 MG 24 hr tablet TAKE ONE (1) TABLET BY MOUTH TWO (2) TIMES DAILY WITH OR IMMEDIATELY FOLLOWING A MEAL Patient taking differently: Take 100 mg by mouth 2 (two) times daily.  10/30/17   Allred, Jeneen Rinks, MD  nitroGLYCERIN (NITROSTAT) 0.4 MG SL tablet Place 1 tablet (0.4 mg total) under the tongue every 5 (five) minutes as needed for chest pain. 10/30/17 01/28/19  Allred, Jeneen Rinks, MD  warfarin (COUMADIN) 4 MG tablet Take as directed by [facility] Patient taking differently: Take 2-4 mg by mouth See admin instructions. Take 1 tablet (4 mg totally) by mouth on Mon, Wed, Thur, Fri; take 0.5 tablet (2 mg totally) by mouth on Tues and Sat. 10/30/17   Allred,  Jeneen Rinks, MD    Family History Family History  Problem Relation Age of Onset  . Heart attack Father   . Heart disease Father   . Hypertension Mother   . Heart disease Mother   . Cancer Other     Social History Social History   Tobacco Use  . Smoking status: Never Smoker  . Smokeless tobacco: Never Used  Substance Use Topics  . Alcohol use: No  . Drug use: No     Allergies   No known allergies   Review of Systems Review of Systems  Unable to perform ROS: Mental status change     Physical Exam Updated Vital Signs BP (!) 100/48 (BP Location: Right Arm)   Pulse 93   Temp (!) 102.8 F (39.3 C) (Oral)   Resp 19   Ht 5' 2"  (1.575 m)   SpO2 96%   BMI 25.61 kg/m   Physical Exam  Constitutional: She appears well-developed and well-nourished. No distress.  HENT:  Head: Normocephalic.  Facial deformity from prior left eye enucleation.  Eyes: Conjunctivae are normal.  Neck: Neck supple.  Cardiovascular: Normal rate and regular rhythm.  No murmur heard. Pulmonary/Chest: She is in respiratory  distress. She exhibits no tenderness.  Patient has mild tachypnea with diffuse rhonchi.  Abdominal: Soft. She exhibits no distension. There is no tenderness.  Musculoskeletal: She exhibits edema and tenderness.  The right leg is diffusely edematous.  Staples are in place at the surgical site at the right proximal lateral hip.  There is no erythema or drainage from these wounds.  However, with range of motion of the right hip, the patient moans and grimaces in pain.  Neurological: She is alert.  Patient is alert and able to tell me her name, but otherwise disoriented.  Skin: Skin is warm and dry.  Psychiatric: She has a normal mood and affect.  Nursing note and vitals reviewed.    ED Treatments / Results  Labs (all labs ordered are listed, but only abnormal results are displayed) Labs Reviewed  COMPREHENSIVE METABOLIC PANEL - Abnormal; Notable for the following components:      Result Value   Sodium 133 (*)    Chloride 93 (*)    Glucose, Bld 280 (*)    BUN 44 (*)    Creatinine, Ser 2.15 (*)    Calcium 8.3 (*)    Total Protein 6.0 (*)    Albumin 2.4 (*)    GFR calc non Af Amer 19 (*)    GFR calc Af Amer 22 (*)    All other components within normal limits  CBC WITH DIFFERENTIAL/PLATELET - Abnormal; Notable for the following components:   WBC 13.0 (*)    RBC 2.62 (*)    Hemoglobin 7.7 (*)    HCT 25.3 (*)    RDW 18.2 (*)    Neutro Abs 12.0 (*)    Lymphs Abs 0.6 (*)    All other components within normal limits  URINALYSIS, ROUTINE W REFLEX MICROSCOPIC - Abnormal; Notable for the following components:   APPearance CLOUDY (*)    Hgb urine dipstick MODERATE (*)    Protein, ur 30 (*)    Leukocytes, UA SMALL (*)    All other components within normal limits  URINALYSIS, MICROSCOPIC (REFLEX) - Abnormal; Notable for the following components:   Bacteria, UA FEW (*)    All other components within normal limits  I-STAT CG4 LACTIC ACID, ED - Abnormal; Notable for the following  components:  Lactic Acid, Venous 2.82 (*)    All other components within normal limits  I-STAT TROPONIN, ED - Abnormal; Notable for the following components:   Troponin i, poc 0.26 (*)    All other components within normal limits  CULTURE, BLOOD (ROUTINE X 2)  CULTURE, BLOOD (ROUTINE X 2)  URINE CULTURE  PROTIME-INR  I-STAT CG4 LACTIC ACID, ED    EKG EKG Interpretation  Date/Time:  Saturday Apr 04 2018 20:58:56 EDT Ventricular Rate:  94 PR Interval:    QRS Duration: 92 QT Interval:  307 QTC Calculation: 384 R Axis:   43 Text Interpretation:  Sinus rhythm Repol abnrm suggests ischemia, diffuse leads No STEMI.  Confirmed by Nanda Quinton [phone]) on 04/04/2018 9:05:36 PM   Radiology Dg Chest Port 1 View  Result Date: 04/04/2018 CLINICAL DATA:  Fever and decreased responsiveness EXAM: PORTABLE CHEST 1 VIEW COMPARISON:  Mar 22, 2018 FINDINGS: There is patchy infiltrate in both lung bases. Heart is upper normal in size with pulmonary vascularity normal. There is aortic atherosclerosis. Pacemaker leads are attached the right atrium and right ventricle. Bones osteoporotic. No adenopathy. IMPRESSION: Patchy infiltrate felt to represent pneumonia in both lung bases. Lungs elsewhere clear. Stable cardiac silhouette. There is aortic atherosclerosis. Aortic Atherosclerosis (ICD10-I70.0). Electronically Signed   By: Lowella Grip III M.D.   On: 04/04/2018 21:30   Dg Hip Unilat With Pelvis 2-3 Views Right  Result Date: 04/04/2018 CLINICAL DATA:  Recent right hip ORIF EXAM: DG HIP (WITH OR WITHOUT PELVIS) 2-3V RIGHT COMPARISON:  03/22/2018 FINDINGS: Antegrade intramedullary nail with interlocking femoral neck screw without abnormal perihardware lucency. Fracture site is in anatomic alignment. No acute fracture. IMPRESSION: Right femoral ORIF hardware without adverse features. Electronically Signed   By: Ulyses Jarred M.D.   On: 04/04/2018 22:18    Procedures Procedures (including critical  care time)  Medications Ordered in ED Medications  piperacillin-tazobactam (ZOSYN) IVPB 2.25 g (has no administration in time range)  vancomycin (VANCOCIN) IVPB 1000 mg/200 mL premix (1,000 mg Intravenous New Bag/Given 04/04/18 2223)  piperacillin-tazobactam (ZOSYN) IVPB 3.375 g (0 g Intravenous Stopped 04/04/18 2221)     Initial Impression / Assessment and Plan / ED Course  I have reviewed the triage vital signs and the nursing notes.  Pertinent labs & imaging results that were available during my care of the patient were reviewed by me and considered in my medical decision making (see chart for details).    Patient is a 84 year old female with history as above who presents from her rehab facility with altered mental status.  Here, patient is oriented to name only.  She is febrile.  She is hypoxic requiring 2 L of oxygen via nasal cannula.  Her caregiver arrived and provided most of the history.  It sounds that the patient has been somnolent and less interactive all week, however her mental status has declined more acutely over the last couple of days.  The patient has not had any specific complaints.  Here, she is ill-appearing.  She does have an indwelling Foley catheter.  This is new since her recent hospitalization.  She does have mild tachypnea and diffuse rhonchi.  She also has tenderness with range of motion of her right hip.  Sepsis work-up initiated.  She was covered with vancomycin and Zosyn.  Blood and urine cultures pending.  Her EKG is notable for diffuse precordial ST segment depression.  The patient is unable to specify whether or not she is having any chest pain.  Her troponin is elevated at 0.26.  Chest x-ray shows evidence of bilateral basilar pneumonia.  She also has a leukocytosis, mild lactic acidosis, and AKI.  I have spoken to cardiology about her troponin elevation and abnormal EKG.  They feel that her presentation is most consistent with sepsis due to pneumonia.  They will  see the patient.  However, they recommend admission to hospitalist.  I have spoken to hospitalist who will admit the patient.  Final Clinical Impressions(s) / ED Diagnoses   Final diagnoses:  Sepsis, due to unspecified organism [facility])  AKI (acute kidney injury) ([place])  HCAP (healthcare-associated pneumonia)  Elevated troponin  Abnormal EKG    ED Discharge Orders    None       Clifton James, MD 04/05/18 2337    Margette Fast, MD 04/06/18 0914    Margette Fast, MD 04/06/18 [phone]

## 2018-04-05 ENCOUNTER — Encounter (HOSPITAL_COMMUNITY): Payer: Self-pay | Admitting: General Practice

## 2018-04-05 ENCOUNTER — Inpatient Hospital Stay (HOSPITAL_COMMUNITY): Payer: Medicare Other

## 2018-04-05 ENCOUNTER — Other Ambulatory Visit: Payer: Self-pay

## 2018-04-05 DIAGNOSIS — I248 Other forms of acute ischemic heart disease: Secondary | ICD-10-CM

## 2018-04-05 DIAGNOSIS — J189 Pneumonia, unspecified organism: Secondary | ICD-10-CM

## 2018-04-05 DIAGNOSIS — A419 Sepsis, unspecified organism: Principal | ICD-10-CM

## 2018-04-05 DIAGNOSIS — G9341 Metabolic encephalopathy: Secondary | ICD-10-CM | POA: Diagnosis present

## 2018-04-05 LAB — BASIC METABOLIC PANEL
Anion gap: 12 (ref 5–15)
BUN: 45 mg/dL — AB (ref 6–20)
CHLORIDE: 96 mmol/L — AB (ref 101–111)
CO2: 26 mmol/L (ref 22–32)
Calcium: 7.9 mg/dL — ABNORMAL LOW (ref 8.9–10.3)
Creatinine, Ser: 2.14 mg/dL — ABNORMAL HIGH (ref 0.44–1.00)
GFR calc Af Amer: 22 mL/min — ABNORMAL LOW (ref >60)
GFR calc non Af Amer: 19 mL/min — ABNORMAL LOW (ref >60)
GLUCOSE: 160 mg/dL — AB (ref 65–99)
POTASSIUM: 4.7 mmol/L (ref 3.5–5.1)
Sodium: 134 mmol/L — ABNORMAL LOW (ref 135–145)

## 2018-04-05 LAB — CBC
HEMATOCRIT: 25.5 % — AB (ref 36.0–46.0)
Hemoglobin: 7.7 g/dL — ABNORMAL LOW (ref 12.0–15.0)
MCH: 29.5 pg (ref 26.0–34.0)
MCHC: 30.2 g/dL (ref 30.0–36.0)
MCV: 97.7 fL (ref 78.0–100.0)
Platelets: 249 10*3/uL (ref 150–400)
RBC: 2.61 MIL/uL — ABNORMAL LOW (ref 3.87–5.11)
RDW: 18.1 % — AB (ref 11.5–15.5)
WBC: 11 10*3/uL — ABNORMAL HIGH (ref 4.0–10.5)

## 2018-04-05 LAB — BLOOD GAS, ARTERIAL
ACID-BASE EXCESS: 4 mmol/L — AB (ref 0.0–2.0)
Bicarbonate: 27.9 mmol/L (ref 20.0–28.0)
DRAWN BY: 270221
FIO2: 0.21
O2 Saturation: 84.6 %
PCO2 ART: 40.9 mmHg (ref 32.0–48.0)
Patient temperature: 98.2
pH, Arterial: 7.448 (ref 7.350–7.450)
pO2, Arterial: 48.5 mmHg — ABNORMAL LOW (ref 83.0–108.0)

## 2018-04-05 LAB — TROPONIN I
Troponin I: 0.36 ng/mL (ref <0.03)
Troponin I: 0.31 ng/mL (ref <0.03)
Troponin I: 0.38 ng/mL (ref <0.03)

## 2018-04-05 LAB — MAGNESIUM: Magnesium: 1.5 mg/dL — ABNORMAL LOW (ref 1.7–2.4)

## 2018-04-05 LAB — LIPID PANEL
CHOLESTEROL: 77 mg/dL (ref 0–200)
HDL: 34 mg/dL — AB (ref >40)
LDL CALC: 33 mg/dL (ref 0–99)
Total CHOL/HDL Ratio: 2.3 RATIO
Triglycerides: 52 mg/dL (ref <150)
VLDL: 10 mg/dL (ref 0–40)

## 2018-04-05 LAB — PHOSPHORUS: Phosphorus: 4.2 mg/dL (ref 2.5–4.6)

## 2018-04-05 LAB — PROTIME-INR
INR: 4.01
INR: 3.59
Prothrombin Time: 38.8 seconds — ABNORMAL HIGH (ref 11.4–15.2)
Prothrombin Time: 35.6 seconds — ABNORMAL HIGH (ref 11.4–15.2)

## 2018-04-05 LAB — GLUCOSE, CAPILLARY
GLUCOSE-CAPILLARY: 166 mg/dL — AB (ref 65–99)
GLUCOSE-CAPILLARY: 119 mg/dL — AB (ref 65–99)
Glucose-Capillary: 147 mg/dL — ABNORMAL HIGH (ref 65–99)
Glucose-Capillary: 117 mg/dL — ABNORMAL HIGH (ref 65–99)
Glucose-Capillary: 133 mg/dL — ABNORMAL HIGH (ref 65–99)

## 2018-04-05 LAB — STREP PNEUMONIAE URINARY ANTIGEN: Strep Pneumo Urinary Antigen: NEGATIVE

## 2018-04-05 LAB — LACTIC ACID, PLASMA: Lactic Acid, Venous: 1.9 mmol/L (ref 0.5–1.9)

## 2018-04-05 LAB — AMMONIA: AMMONIA: 18 umol/L (ref 9–35)

## 2018-04-05 MED ORDER — ONDANSETRON HCL 4 MG/2ML IJ SOLN
4.0000 mg | Freq: Four times a day (QID) | INTRAMUSCULAR | Status: DC | PRN
Start: 2018-04-05 — End: 2018-04-15

## 2018-04-05 MED ORDER — ACETAMINOPHEN 650 MG RE SUPP: 650.0000 mg | Freq: Four times a day (QID) | RECTAL | Status: DC | PRN

## 2018-04-05 MED ORDER — HYDROCODONE-ACETAMINOPHEN 5-325 MG PO TABS
1.0000 | ORAL_TABLET | ORAL | Status: DC | PRN
  Administered 2018-04-05: 1 via ORAL
  Administered 2018-04-06 – 2018-04-07 (×2): 2 via ORAL
  Administered 2018-04-07: 1 via ORAL
  Administered 2018-04-08: 2 via ORAL
  Administered 2018-04-09: 1 via ORAL
  Administered 2018-04-10 – 2018-04-14 (×7): 2 via ORAL
  Filled 2018-04-05: qty 2
  Filled 2018-04-05: qty 1
  Filled 2018-04-05: qty 2
  Filled 2018-04-05: qty 1
  Filled 2018-04-05 (×6): qty 2
  Filled 2018-04-05: qty 1
  Filled 2018-04-05 (×2): qty 2

## 2018-04-05 MED ORDER — SODIUM CHLORIDE 0.9 % IV SOLN
INTRAVENOUS | Status: DC
  Administered 2018-04-05 – 2018-04-08 (×5): via INTRAVENOUS

## 2018-04-05 MED ORDER — BISACODYL 5 MG PO TBEC
5.0000 mg | DELAYED_RELEASE_TABLET | Freq: Every day | ORAL | Status: DC | PRN
  Administered 2018-04-06: 5 mg via ORAL
  Filled 2018-04-05: qty 1

## 2018-04-05 MED ORDER — MAGNESIUM CITRATE PO SOLN: 1.0000 | Freq: Once | ORAL | Status: DC | PRN

## 2018-04-05 MED ORDER — SENNOSIDES-DOCUSATE SODIUM 8.6-50 MG PO TABS
1.0000 | ORAL_TABLET | Freq: Every evening | ORAL | Status: DC | PRN
  Administered 2018-04-06: 1 via ORAL
  Filled 2018-04-05: qty 1

## 2018-04-05 MED ORDER — ORAL CARE MOUTH RINSE
15.0000 mL | Freq: Two times a day (BID) | OROMUCOSAL | Status: DC
  Administered 2018-04-05 – 2018-04-15 (×16): 15 mL via OROMUCOSAL

## 2018-04-05 MED ORDER — ONDANSETRON HCL 4 MG PO TABS: 4.0000 mg | ORAL_TABLET | Freq: Four times a day (QID) | ORAL | Status: DC | PRN

## 2018-04-05 MED ORDER — IPRATROPIUM-ALBUTEROL 0.5-2.5 (3) MG/3ML IN SOLN: 3.0000 mL | Freq: Four times a day (QID) | RESPIRATORY_TRACT | Status: DC | PRN

## 2018-04-05 MED ORDER — ESCITALOPRAM OXALATE 10 MG PO TABS
5.0000 mg | ORAL_TABLET | Freq: Every day | ORAL | Status: DC
  Administered 2018-04-05 – 2018-04-15 (×11): 5 mg via ORAL
  Filled 2018-04-05 (×11): qty 1

## 2018-04-05 MED ORDER — METOPROLOL SUCCINATE ER 100 MG PO TB24
100.0000 mg | ORAL_TABLET | Freq: Two times a day (BID) | ORAL | Status: DC
  Filled 2018-04-05: qty 1

## 2018-04-05 MED ORDER — FUROSEMIDE 40 MG PO TABS: 20.0000 mg | ORAL_TABLET | Freq: Every day | ORAL | Status: DC

## 2018-04-05 MED ORDER — ATORVASTATIN CALCIUM 40 MG PO TABS
40.0000 mg | ORAL_TABLET | Freq: Every day | ORAL | Status: DC
  Administered 2018-04-05 – 2018-04-14 (×8): 40 mg via ORAL
  Filled 2018-04-05 (×8): qty 1

## 2018-04-05 MED ORDER — NITROGLYCERIN 0.4 MG SL SUBL
0.4000 mg | SUBLINGUAL_TABLET | SUBLINGUAL | Status: DC | PRN
Start: 2018-04-05 — End: 2018-04-15

## 2018-04-05 MED ORDER — ACETAMINOPHEN 325 MG PO TABS: 650.0000 mg | ORAL_TABLET | Freq: Four times a day (QID) | ORAL | Status: DC | PRN

## 2018-04-05 MED ORDER — ASPIRIN EC 81 MG PO TBEC: 81.0000 mg | DELAYED_RELEASE_TABLET | Freq: Every day | ORAL | Status: DC

## 2018-04-05 NOTE — Progress Notes (Signed)
CRITICAL VALUE ALERT  Critical Value:  Troponin 0.36  Date & Time Notied:  04/05/2018  5364  Provider Notified: Kennon Holter  Orders Received/Actions taken: No new orders

## 2018-04-05 NOTE — Evaluation (Signed)
Clinical/Bedside Swallow Evaluation Patient Details  Name: Kylie Sanchez MRN: [id number] Date of Birth: 21-Nov-1924  Today's Date: 04/05/2018 Time: SLP Start Time (ACUTE ONLY): 1525 SLP Stop Time (ACUTE ONLY): 1550 SLP Time Calculation (min) (ACUTE ONLY): 25 min  Past Medical History:  Past Medical History:  Diagnosis Date  . Cancer ([place]) 2015   left eye  . Cataract   . CKD (chronic kidney disease) stage 3, GFR 30-59 ml/min (HCC)    due to DM  . Dizziness   . Hyperlipidemia   . Hypertension   . Hypertensive cardiovascular disease   . Impaired vision   . Memory loss   . Paroxysmal atrial fibrillation (HCC)   . Presence of permanent cardiac pacemaker   . Retinopathy   . Sick sinus syndrome ([place])   . Type II or unspecified type diabetes mellitus without mention of complication, not stated as uncontrolled    Past Surgical History:  Past Surgical History:  Procedure Laterality Date  . DENTAL SURGERY    . ENDARTERECTOMY Right 05/13/2017   Procedure: REDO ENDARTERECTOMY CAROTID Resection Redundant Internal Corotid Artery.;  Surgeon: Angelia Mould, MD;  Location: [place];  Service: Vascular;  Laterality: Right;  . ENDARTERECTOMY Right 05/13/2017   Procedure: ENDARTERECTOMY CAROTID-RIGHT;  Surgeon: Serafina Mitchell, MD;  Location: [facility] OR;  Service: Vascular;  Laterality: Right;  . EYE SURGERY Left    tumor and eye removed  . INTRAMEDULLARY (IM) NAIL INTERTROCHANTERIC Right 03/23/2018   Procedure: INTRAMEDULLARY (IM) NAIL RIGHT INTERTROCHANTRIC;  Surgeon: Mcarthur Rossetti, MD;  Location: [place];  Service: Orthopedics;  Laterality: Right;  . PACEMAKER INSERTION  04/20/12   MDT Adapta L implanted by Dr Rayann Heman  . PATCH ANGIOPLASTY Right 05/13/2017   Procedure: PATCH ANGIOPLASTY USING Rueben Bash BIOLOGIC PATCH;  Surgeon: Serafina Mitchell, MD;  Location: [facility] OR;  Service: Vascular;  Laterality: Right;  . PERMANENT PACEMAKER INSERTION N/A 04/20/2012   Procedure: PERMANENT PACEMAKER INSERTION;   Surgeon: Thompson Grayer, MD;  Location: [facility] CATH LAB;  Service: Cardiovascular;  Laterality: N/A;   HPI:  Kylie Sanchez is a 84 y.o. female with a known history of CKD3, HTN, HLD, PAF, DM2, recent right hip fracture s/p surgical repair (WBAT) presents to the emergency department for evaluation of AMS.  Patient was in a usual state of health until this past week when she has been lethargic.  Baseline is fully alert and oriented per caregiver report to EDP; workup revealing pneumonia, mildly elevated troponins, acute kidney injuryMBS 05/16/17 revealed no aspiration or penetration, occasional throat clearing noted during exam. Esophageal sweep revealed stasis, appearance of dysmotility. Regular diet, thin liquids recommended with esophageal precautions.   Assessment / Plan / Recommendation Clinical Impression   Pt presents with increased risk for aspiration given current PNA, esophageal stasis seen on prior MBS, and altered mentation. Cognition appears to be greatest risk at this time. Pt is alert but confused. There is oral holding with liquids, however no overt signs of aspiration. Pt has poor awareness of boluses, and is distracted easily during mastication of solids. Caregiver reported noticing increased holding and pocketing of solids. Recommend dys 1 (puree), thin liquids with full supervision, meds crushed in puree. Full supervision to ensure pt fully alert and attentive to POs. Will follow up for tolerance, advancement likely with improvements in mentation.   SLP Visit Diagnosis: Dysphagia, unspecified (R13.10)    Aspiration Risk  Mild aspiration risk;Moderate aspiration risk    Diet Recommendation Dysphagia 1 (Puree);Thin liquid   Liquid  Administration via: Cup;Straw Medication Administration: Crushed meds with puree Supervision: Full supervision/cueing for compensatory strategies Compensations: Slow rate;Small sips/bites;Minimize environmental distractions Postural Changes: Seated upright at 90  degrees;Remain upright for at least 30 minutes after po intake    Other  Recommendations Oral Care Recommendations: Oral care BID Other Recommendations: Clarify dietary restrictions   Follow up Recommendations Other (comment)(tbd)      Frequency and Duration min 2x/week  2 weeks       Prognosis Prognosis for Safe Diet Advancement: Good Barriers to Reach Goals: Cognitive deficits      Swallow Study   General Date of Onset: 04/04/18 HPI: Kylie Sanchez is a 84 y.o. female with a known history of CKD3, HTN, HLD, PAF, DM2, recent right hip fracture s/p surgical repair (WBAT) presents to the emergency department for evaluation of AMS.  Patient was in a usual state of health until this past week when she has been lethargic.  Baseline is fully alert and oriented per caregiver report to EDP; workup revealing pneumonia, mildly elevated troponins, acute kidney injuryMBS 05/16/17 revealed no aspiration or penetration, occasional throat clearing noted during exam. Esophageal sweep revealed stasis, appearance of dysmotility. Regular diet, thin liquids recommended with esophageal precautions. Type of Study: Bedside Swallow Evaluation Previous Swallow Assessment: see HPI Diet Prior to this Study: NPO Temperature Spikes Noted: Yes(102.8) Respiratory Status: Nasal cannula History of Recent Intubation: No Behavior/Cognition: Confused;Alert;Distractible Oral Cavity Assessment: Within Functional Limits Oral Care Completed by SLP: No Oral Cavity - Dentition: Other (Comment)(top and bottom partials) Vision: Impaired for self-feeding(needs assist) Self-Feeding Abilities: Needs assist;Needs set up Patient Positioning: Upright in bed Baseline Vocal Quality: Normal Volitional Cough: Strong Volitional Swallow: Able to elicit    Oral/Motor/Sensory Function Overall Oral Motor/Sensory Function: Within functional limits   Ice Chips Ice chips: Not tested   Thin Liquid Thin Liquid: Impaired Presentation:  Cup;Straw;Self Fed Oral Phase Impairments: Poor awareness of bolus Oral Phase Functional Implications: Oral holding;Prolonged oral transit    Nectar Thick Nectar Thick Liquid: Not tested   Honey Thick Honey Thick Liquid: Not tested   Puree Puree: Within functional limits Presentation: Self Fed;Spoon   Solid   GO   Solid: Impaired Oral Phase Functional Implications: Prolonged oral transit;Impaired mastication;Oral residue       Deneise Lever, [place], [employer] Pathologist [place] 04/05/2018,3:58 PM

## 2018-04-05 NOTE — Evaluation (Signed)
Physical Therapy Evaluation Patient Details Name: Kylie Sanchez MRN: [id number] DOB: 08-12-1925 Today's Date: 04/05/2018   History of Present Illness  Kylie Sanchez is a 84 y.o. female with a known history of CKD3, HTN, HLD, PAF, DM2, recent right hip fracture s/p surgical repair (WBAT) presents to the emergency department for evaluation of AMS.  Patient was in a usual state of health until this past week when she has been lethargic.  Baseline is fully alert and oriented per caregiver report to EDP; workup revealing pneumonia, mildly elevated troponins, acute kidney injury  Clinical Impression   Pt admitted with above diagnosis. Pt currently with functional limitations due to the deficits listed below (see PT Problem List). Presents with overall decr functional mobility and decr activity tolerance in setting of acute illness/pneumonia/sepsis after recent hip fracture and surgical repair. Currently requires +2 Total/Max assist with all aspects of mobiltiy;  Pt will benefit from skilled PT to increase their independence and safety with mobility to allow discharge to the venue listed below.       Follow Up Recommendations SNF;Supervision/Assistance - 24 hour    Equipment Recommendations  None recommended by PT    Recommendations for Other Services       Precautions / Restrictions Precautions Precautions: Fall Precaution Comments: no L eye Restrictions Weight Bearing Restrictions: Yes RLE Weight Bearing: Weight bearing as tolerated      Mobility  Bed Mobility Overal bed mobility: Needs Assistance Bed Mobility: Supine to Sit;Sit to Supine     Supine to sit: +2 for physical assistance;+2 for safety/equipment;HOB elevated;Total assist Sit to supine: Total assist;+2 for physical assistance;+2 for safety/equipment   General bed mobility comments: increased time and effort, cueing for sequencing, heavy physical assistance of two for all aspects; pt frequent rest breaks secondary to pain and  anxiety  Transfers                 General transfer comment: Unable to assess as Ms. Cookson tended to push back into heavy posterior lean  Ambulation/Gait                Stairs            Wheelchair Mobility    Modified Rankin (Stroke Patients Only)       Balance     Sitting balance-Leahy Scale: Zero Sitting balance - Comments: pushing posteriorly                                     Pertinent Vitals/Pain Pain Assessment: Faces Faces Pain Scale: Hurts worst Pain Location: R hip with movement Pain Descriptors / Indicators: Operative site guarding;Aching Pain Intervention(s): Monitored during session    Home Living Family/patient expects to be discharged to:: Skilled nursing facility Living Arrangements: Other relatives;Non-relatives/Friends Available Help at Discharge: Available PRN/intermittently;Family Type of Home: House Home Access: Ramped entrance     Home Layout: One level Home Equipment: Prue - 2 wheels;Shower seat Additional Comments: lives with her brother    Prior Function Level of Independence: Needs assistance   Gait / Transfers Assistance Needed: uses RW limited household ambulation  ADL's / Homemaking Assistance Needed: independent with ADLS, assist with iADLs        Hand Dominance        Extremity/Trunk Assessment   Upper Extremity Assessment Upper Extremity Assessment: Generalized weakness    Lower Extremity Assessment Lower Extremity Assessment: Generalized weakness;RLE deficits/detail RLE Deficits /  Details: Grossly decr AROM and strength; PROM in all planes of r hip painful; tends to hold hip in internally rotated position at rest RLE: Unable to fully assess due to pain    Cervical / Trunk Assessment Cervical / Trunk Assessment: Kyphotic  Communication   Communication: HOH  Cognition Arousal/Alertness: Lethargic Behavior During Therapy: WFL for tasks assessed/performed Overall Cognitive  Status: Impaired/Different from baseline Area of Impairment: Following commands;Safety/judgement;Problem solving                       Following Commands: Follows one step commands inconsistently;Follows one step commands with increased time     Problem Solving: Slow processing;Difficulty sequencing;Requires verbal cues        General Comments General comments (skin integrity, edema, etc.): Session conducted on 3 L supplemental O2    Exercises     Assessment/Plan    PT Assessment Patient needs continued PT services  PT Problem List Decreased strength;Decreased range of motion;Decreased activity tolerance;Decreased balance;Decreased mobility;Decreased cognition;Decreased knowledge of use of DME;Decreased knowledge of precautions;Pain       PT Treatment Interventions DME instruction;Gait training;Functional mobility training;Therapeutic activities;Therapeutic exercise;Balance training;Neuromuscular re-education;Patient/family education    PT Goals (Current goals can be found in the Care Plan section)  Acute Rehab PT Goals Patient Stated Goal: less pain PT Goal Formulation: With patient Time For Goal Achievement: 04/19/18 Potential to Achieve Goals: Fair    Frequency Min 2X/week   Barriers to discharge        Co-evaluation               AM-PAC PT "6 Clicks" Daily Activity  Outcome Measure Difficulty turning over in bed (including adjusting bedclothes, sheets and blankets)?: Unable Difficulty moving from lying on back to sitting on the side of the bed? : Unable Difficulty sitting down on and standing up from a chair with arms (e.g., wheelchair, bedside commode, etc,.)?: Unable Help needed moving to and from a bed to chair (including a wheelchair)?: Total Help needed walking in hospital room?: Total Help needed climbing 3-5 steps with a railing? : Total 6 Click Score: 6    End of Session Equipment Utilized During Treatment: Oxygen(bed pad) Activity  Tolerance: Patient limited by lethargy;Patient limited by pain Patient left: in bed;with call bell/phone within reach;with family/visitor present Nurse Communication: Mobility status PT Visit Diagnosis: Muscle weakness (generalized) (M62.81);Pain;Other abnormalities of gait and mobility (R26.89) Pain - Right/Left: Right Pain - part of body: Hip    Time: 3832-9191 PT Time Calculation (min) (ACUTE ONLY): 22 min   Charges:   PT Evaluation $PT Eval Moderate Complexity: 1 Mod     PT G Codes:        Roney Janeese, PT  Acute Rehabilitation Services Pager [phone] Office [phone]   [name] 04/05/2018, 2:14 PM

## 2018-04-05 NOTE — Evaluation (Signed)
SLP Cancellation Note  Patient Details Name: [name] MRN: [id number] DOB: Sep 11, 1925   Cancelled treatment:       Reason Eval/Treat Not Completed: Other (comment);Patient at procedure or test/unavailable(pt being transported to CT - will continue efforts)   Macario Golds 04/05/2018, 11:34 AM   Luanna Salk, [facility] SLP [phone]

## 2018-04-05 NOTE — Progress Notes (Signed)
PT has DNR form. Messaged MD to change order.   Eleanora Neighbor, RN

## 2018-04-05 NOTE — Progress Notes (Signed)
Progress Note  Patient Name: Kylie Sanchez Date of Encounter: 04/05/2018  Primary Cardiologist: Dr. Thompson Grayer  Subjective   Patient without specific complaints, somnolent.  Inpatient Medications    Scheduled Meds: . atorvastatin  40 mg Oral q1800  . escitalopram  5 mg Oral Daily  . mouth rinse  15 mL Mouth Rinse BID   Continuous Infusions: . sodium chloride 75 mL/hr at 04/05/18 0301  . piperacillin-tazobactam (ZOSYN)  IV Stopped (04/05/18 3500)  . vancomycin Stopped (04/04/18 2323)   PRN Meds: acetaminophen **OR** acetaminophen, bisacodyl, HYDROcodone-acetaminophen, ipratropium-albuterol, magnesium citrate, nitroGLYCERIN, ondansetron **OR** ondansetron (ZOFRAN) IV, senna-docusate   Vital Signs    Vitals:   04/05/18 0000 04/05/18 0048 04/05/18 0438 04/05/18 0934  BP: (!) 104/59 96/60 (!) 97/58 102/90  Pulse: 84 88 88 88  Resp: 17 20 14 20   Temp:  98.3 F (36.8 C) 98.1 F (36.7 C) 98.2 F (36.8 C)  TempSrc:  Oral Oral Oral  SpO2: 100% 99% 92% 99%  Weight:  167 lb (75.8 kg)    Height:        Intake/Output Summary (Last 24 hours) at 04/05/2018 1305 Last data filed at 04/05/2018 1034 Gross per 24 hour  Intake 398.75 ml  Output 300 ml  Net 98.75 ml   Filed Weights   04/05/18 0048  Weight: 167 lb (75.8 kg)    Telemetry    Sinus rhythm with intermittent atrial fibrillation.  Personally reviewed.  ECG    Tracing from 04/04/2018 showed sinus rhythm with ST-T wave abnormalities suggestive of ischemia.  Personally reviewed.  Physical Exam   GEN:  Chronically ill-appearing elderly woman. Neck: No JVD. Cardiac: RRR, no murmur, rub, or gallop.  Respiratory:  Coarse breath sounds.Marland Kitchen GI: Soft, nontender, bowel sounds present. MS: No edema. Neuro:   No obvious focal deficits, but examination limited. Psych: Somnolent, oriented to self.  Labs    Chemistry Recent Labs  Lab 04/04/18 2124 04/05/18 0723  NA 133* 134*  K 4.9 4.7  CL 93* 96*  CO2 26 26    GLUCOSE 280* 160*  BUN 44* 45*  CREATININE 2.15* 2.14*  CALCIUM 8.3* 7.9*  PROT 6.0*  --   ALBUMIN 2.4*  --   AST 26  --   ALT 17  --   ALKPHOS 89  --   BILITOT 0.9  --   GFRNONAA 19* 19*  GFRAA 22* 22*  ANIONGAP 14 12     Hematology Recent Labs  Lab 04/04/18 2124 04/05/18 0723  WBC 13.0* 11.0*  RBC 2.62* 2.61*  HGB 7.7* 7.7*  HCT 25.3* 25.5*  MCV 96.6 97.7  MCH 29.4 29.5  MCHC 30.4 30.2  RDW 18.2* 18.1*  PLT 332 249    Cardiac Enzymes Recent Labs  Lab 04/05/18 0218 04/05/18 0723  TROPONINI 0.36* 0.31*    Recent Labs  Lab 04/04/18 2155  TROPIPOC 0.26*     Radiology    Ct Head Wo Contrast  Result Date: 04/05/2018 CLINICAL DATA:  Altered mental status. EXAM: CT HEAD WITHOUT CONTRAST TECHNIQUE: Contiguous axial images were obtained from the base of the skull through the vertex without intravenous contrast. COMPARISON:  03/22/2018 FINDINGS: Brain: No evidence of acute infarction, hemorrhage, hydrocephalus, extra-axial collection or mass lesion/mass effect. There is ventricular and sulcal enlargement reflecting generalized atrophy stable from the prior study. Patchy areas of white matter hypoattenuation are also noted consistent with mild chronic microvascular ischemic change. Vascular: No hyperdense vessel or unexpected calcification. Skull: Status post left orbital  exenteration, stable from prior CT. No skull fracture or suspicious lesion. Sinuses/Orbits: Visualize right globe and orbit unremarkable. No acute findings in the remaining sinuses. Clear mastoid air cells. Other: None. IMPRESSION: 1. No acute intracranial abnormalities. 2. Atrophy and chronic microvascular ischemic change, stable from prior study. 3. Status post left orbital surgery, also unchanged. Electronically Signed   By: Lajean Manes M.D.   On: 04/05/2018 12:25   Dg Chest Port 1 View  Result Date: 04/04/2018 CLINICAL DATA:  Fever and decreased responsiveness EXAM: PORTABLE CHEST 1 VIEW COMPARISON:   Mar 22, 2018 FINDINGS: There is patchy infiltrate in both lung bases. Heart is upper normal in size with pulmonary vascularity normal. There is aortic atherosclerosis. Pacemaker leads are attached the right atrium and right ventricle. Bones osteoporotic. No adenopathy. IMPRESSION: Patchy infiltrate felt to represent pneumonia in both lung bases. Lungs elsewhere clear. Stable cardiac silhouette. There is aortic atherosclerosis. Aortic Atherosclerosis (ICD10-I70.0). Electronically Signed   By: Lowella Grip III M.D.   On: 04/04/2018 21:30   Dg Hip Unilat With Pelvis 2-3 Views Right  Result Date: 04/04/2018 CLINICAL DATA:  Recent right hip ORIF EXAM: DG HIP (WITH OR WITHOUT PELVIS) 2-3V RIGHT COMPARISON:  03/22/2018 FINDINGS: Antegrade intramedullary nail with interlocking femoral neck screw without abnormal perihardware lucency. Fracture site is in anatomic alignment. No acute fracture. IMPRESSION: Right femoral ORIF hardware without adverse features. Electronically Signed   By: Ulyses Jarred M.D.   On: 04/04/2018 22:18    Cardiac Studies   Echocardiogram 06/16/2017: Study Conclusions  - Left ventricle: The cavity size was normal. Wall thickness was   normal. Systolic function was normal. The estimated ejection   fraction was in the range of 60% to 65%. Wall motion was normal;   there were no regional wall motion abnormalities. Doppler   parameters are consistent with abnormal left ventricular   relaxation (grade 1 diastolic dysfunction). The E/e&' ratio is   between 8-15, suggesting indeterminate LV filling pressure. - Mitral valve: Mildly thickened leaflets . There was mild   regurgitation. - Left atrium: Moderately dilated. - Tricuspid valve: There was mild regurgitation. - Pulmonary arteries: PA peak pressure: 34 mm Hg (S). - Inferior vena cava: The vessel was normal in size. The   respirophasic diameter changes were in the normal range (= 50%),   consistent with normal central venous  pressure.  Impressions:  - Compared to a prior study in 05/2017, the LVEF has normalized.  Patient Profile     84 y.o. 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  Assessment & Plan    1.  Abnormal troponin I in flat pattern not suggestive of ACS.  More consistent with demand ischemia in the setting of ongoing physiologic stressors.  2.  Paroxysmal atrial fibrillation.  On Coumadin as an outpatient.  INR supratherapeutic at this time.  3.  Sick sinus syndrome with [employer] pacemaker in place.  She follows with Dr. Rayann Heman.  4.  History of tachycardia-mediated cardiomyopathy with normalization of LVEF last assessed in 2018 in the range of 60 to 65%.  5.  Pneumonia, on broad-spectrum antibiotics per primary team.  6.  Recent right hip fracture with surgical repair.  Coumadin on hold with supratherapeutic  INR.  She has also been taken off of Toprol-XL with low blood pressures in the setting of pneumonia - would follow telemetry and add back low-dose beta-blocker as tolerated.  No further ischemic testing is planned at this time.  Signed, Rozann Lesches, MD  04/05/2018, 1:05 PM

## 2018-04-05 NOTE — Progress Notes (Signed)
New Admission Note:  Arrival Method: By bed from ED at [place] Orientation: Alert and oriented to self Telemetry: CCMD notified, box 19 Assessment: Completed Skin: Completed, refer to flowsheets IV: Left forearm Pain: Right hip, pain with movement Tubes: chronic foley Safety Measures: Safety Fall Prevention Plan was given, discussed  Admission: Completed [facility] Orientation: Patient has been orientated to the room, unit and the staff. Family: None, caregiver Jacob Moores  Orders have been reviewed and implemented. Will continue to monitor the patient. Call light has been placed within reach and bed alarm has been activated.   Perry Mount, RN  Phone Number: [phone]

## 2018-04-05 NOTE — Progress Notes (Signed)
[facility]                                                                              Patient Demographics  Kylie Sanchez, is a 84 y.o. female, DOB - 09-09-1925, FAO:[id number]  Admit date - 04/04/2018   Admitting Physician Harvie Bridge, DO  Outpatient Primary MD for the patient is Unk Pinto, MD  Outpatient specialists:   LOS - 1  days   Medical records reviewed and are as summarized below:    Chief Complaint  Patient presents with  . Altered Mental Status       Brief summary   Patient is a 84 year old female with history of CKD stage III, hypertension, hyperlipidemia, paroxysmal atrial fib, diabetes, recent right hip fracture status post repair, was discharged to skilled nursing facility.  Patient was sent from the skilled nursing facility with a fever of 103 F, lethargy, altered mental status.   X-ray showed patchy infiltrates in both lung bases, patient was admitted for sepsis.   Assessment & Plan    Principal Problem:   Sepsis due to pneumonia (HCC)/ HCAP -Patient met sepsis criteria at the time of admission due to lactic acidosis, acute kidney injury, fevers, leukocytosis, source likely due to pneumonia -Continue IV vancomycin and Zosyn, follow blood cultures, -Lactic acid, leukocytosis improving -Obtain urine strep antigen, urine Legionella antigen, urine cultures, sputum cultures -Continue IV fluid hydration.  BP soft, continue to hold antihypertensives -SLP for swallow evaluation  Active Problems:   Persistent atrial fibrillation (HCC) with history of tachycardia mediated cardiomyopathy -Currently normal sinus rhythm, BP soft - Will hold metoprolol, hold anticoagulation -INR supratherapeutic, 3.59  Elevated troponins, abnormal EKG -Cardiology consulted, Likely due to demand ischemia from #1 sepsis   type II diabetes mellitus ([place]) - Currently n.p.o., placed on sliding scale insulin - Follow hemoglobin A1c     H/O enucleation  of left eyeball -No acute issues     Closed fracture of right hip (HCC) -Status post surgical repair, will start physical therapy once patient is alert and oriented, will need skilled nursing facility for rehab    Acute metabolic encephalopathy -Likely due to #1, SLP evaluation when alert and oriented -  follow cultures, continue IV antibiotics -Obtain ABG, ammonia level, CT head   Code Status: DNR  DVT Prophylaxis:  SCD's Family Communication: Discussed in detail with the patient, all imaging results, lab results explained to the patient's family member at the bedside   Disposition Plan:   Time Spent in minutes   35 minutes  Procedures:  None  Consultants:   none  Antimicrobials:      Medications  Scheduled Meds: . atorvastatin  40 mg Oral q1800  . escitalopram  5 mg Oral Daily  . mouth rinse  15 mL Mouth Rinse BID   Continuous Infusions: . sodium chloride 75 mL/hr at 04/05/18 0301  . piperacillin-tazobactam (ZOSYN)  IV Stopped (04/05/18 6962)  . vancomycin Stopped (04/04/18 2323)   PRN Meds:.acetaminophen **OR** acetaminophen, bisacodyl, HYDROcodone-acetaminophen, ipratropium-albuterol, magnesium citrate, nitroGLYCERIN, ondansetron **OR** ondansetron (ZOFRAN) IV, senna-docusate   Antibiotics   Anti-infectives (From admission, onward)   Start  Dose/Rate Route Frequency Ordered Stop   04/05/18 0600  piperacillin-tazobactam (ZOSYN) IVPB 2.25 g     2.25 g 100 mL/hr over 30 Minutes Intravenous Every 8 hours 04/04/18 2214     04/04/18 2215  vancomycin (VANCOCIN) IVPB 1000 mg/200 mL premix     1,000 mg 200 mL/hr over 60 Minutes Intravenous Every 48 hours 04/04/18 2214     04/04/18 2115  piperacillin-tazobactam (ZOSYN) IVPB 3.375 g     3.375 g 100 mL/hr over 30 Minutes Intravenous  Once 04/04/18 2109 04/04/18 2221        Subjective:   Kylie Sanchez was seen and examined today.  Family member at the bedside, patient is still lethargic unable to provide  review of systems.  No fevers or chills.   Objective:   Vitals:   04/05/18 0000 04/05/18 0048 04/05/18 0438 04/05/18 0934  BP: (!) 104/59 96/60 (!) 97/58 102/90  Pulse: 84 88 88 88  Resp: _0 Temp:  98.3 F (36.8 C) 98.1 F (36.7 C) 98.2 F (36.8 C)  TempSrc:  Oral Oral Oral  SpO2: 100% 99% 92% 99%  Weight:  75.8 kg (167 lb)    Height:        Intake/Output Summary (Last 24 hours) at 04/05/2018 1048 Last data filed at 04/05/2018 1034 Gross per 24 hour  Intake 398.75 ml  Output 300 ml  Net 98.75 ml     Wt Readings from Last 3 Encounters:  04/05/18 75.8 kg (167 lb)  03/21/18 63.5 kg (140 lb)  01/22/18 63 kg (139 lb)     Exam  General: Lethargic, NAD  Eyes:   HEENT: left enucleation  Cardiovascular: S1 S2 auscultated, Regular rate and rhythm.  Respiratory: Decreased breath sound at the bases  Gastrointestinal: Soft, nontender, nondistended, + bowel sounds  Ext: no pedal edema bilaterally  Neuro: lethargic  Musculoskeletal: No digital cyanosis, clubbing  Skin: No rashes  Psych: somnolent and lethargic   Data Reviewed:  I have personally reviewed following labs and imaging studies  Micro Results Recent Results (from the past 240 hour(s))  Blood Culture (routine x 2)     Status: None (Preliminary result)   Collection Time: 04/04/18  9:27 PM  Result Value Ref Range Status   Specimen Description BLOOD LEFT ARM  Final   Special Requests   Final    BOTTLES DRAWN AEROBIC AND ANAEROBIC Blood Culture adequate volume   Culture   Final    NO GROWTH < 12 HOURS Performed at [facility], [address], [place], [place] [address]    Report Status PENDING  Incomplete    Radiology Reports Dg Chest 1 View  Result Date: 03/22/2018 CLINICAL DATA:  Closed fracture of the right hip.  Preop. EXAM: CHEST  1 VIEW COMPARISON:  05/18/2017 FINDINGS: Heart is top-normal in size. There is aortic atherosclerosis. No definite aneurysm. Left-sided pacemaker  apparatus right atrial and right ventricular leads are redemonstrated. There is scarring overlying the left costophrenic angle and to a lesser degree the right costophrenic angle. Clearing of right-sided pleural effusions since previous exam. No overt pulmonary edema or pulmonary consolidation. Intact bony thorax. 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 IMPRESSION: No pneumonia.  Bibasilar scarring.  Aortic atherosclerosis. Electronically Signed   By: Ashley Royalty M.D.   On: 03/22/2018 01:42   Ct Head Wo Contrast  Result Date: 03/22/2018 CLINICAL DATA:  Status post fall last night. EXAM: CT HEAD WITHOUT CONTRAST TECHNIQUE: Contiguous axial images  were obtained from the base of the skull through the vertex without intravenous contrast. COMPARISON:  May 13, 2017 FINDINGS: Brain: No evidence of acute infarction, hemorrhage, hydrocephalus, extra-axial collection or mass lesion/mass effect. There is chronic diffuse atrophy. Chronic bilateral periventricular white matter small vessel ischemic changes noted. Small old right basal ganglia lacunar infarction is identified unchanged. Vascular: No hyperdense vessel or unexpected calcification. 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 Sinuses/Orbits: No acute abnormality. 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 Other: Right parietal scalp swelling and hematoma is noted. IMPRESSION: No focal acute intracranial abnormality identified. Right parietal scalp swelling hematoma is identified. Electronically Signed   By: Abelardo Diesel M.D.   On: 03/22/2018 07:53   Dg Chest Port 1 View  Result Date: 04/04/2018 CLINICAL DATA:  Fever and decreased responsiveness EXAM: PORTABLE CHEST 1 VIEW COMPARISON:  Mar 22, 2018 FINDINGS: There is patchy infiltrate in both lung bases. Heart is upper normal in size with pulmonary vascularity normal. There is aortic atherosclerosis. Pacemaker leads are attached  the right atrium and right ventricle. Bones osteoporotic. No adenopathy. IMPRESSION: Patchy infiltrate felt to represent pneumonia in both lung bases. Lungs elsewhere clear. Stable cardiac silhouette. There is aortic atherosclerosis. Aortic Atherosclerosis (ICD10-I70.0). Electronically Signed   By: Lowella Grip III M.D.   On: 04/04/2018 21:30   Dg C-arm 1-60 Min  Result Date: 03/23/2018 CLINICAL DATA:  Right hip fracture EXAM: DG C-ARM 61-120 MIN; RIGHT FEMUR 2 VIEWS COMPARISON:  03/22/2018 FINDINGS: Total fluoroscopy time was 3 minutes 24 seconds. Six low resolution intraoperative spot views of the right hip. Images demonstrate intramedullary rod and screw fixation of the right femur for intertrochanteric fracture. IMPRESSION: Intraoperative fluoroscopic assistance provided during right hip surgery Electronically Signed   By: Donavan Foil M.D.   On: 03/23/2018 19:18   Dg Hip Unilat With Pelvis 2-3 Views Left  Result Date: 03/22/2018 CLINICAL DATA:  84 year old female with fall. EXAM: DG HIP (WITH OR WITHOUT PELVIS) 2-3V LEFT; DG HIP (WITH OR WITHOUT PELVIS) 2-3V RIGHT COMPARISON:  None. FINDINGS: There is a comminuted and mildly displaced intertrochanteric fracture of the right femur with mild proximal migration of the femoral diaphysis. There is no fracture or dislocation of the left hip. Sclerotic area involving the right inferior pubic ramus, age indeterminate, possibly chronic. Clinical correlation is recommended. There is advanced osteopenia. There is degenerative changes of the lower lumbar spine. The soft tissues are grossly unremarkable. IMPRESSION: Mildly displaced intertrochanteric fracture of the right femur. No dislocation. Electronically Signed   By: Anner Crete M.D.   On: 03/22/2018 01:39   Dg Hip Unilat With Pelvis 2-3 Views Right  Result Date: 04/04/2018 CLINICAL DATA:  Recent right hip ORIF EXAM: DG HIP (WITH OR WITHOUT PELVIS) 2-3V RIGHT COMPARISON:  03/22/2018 FINDINGS:  Antegrade intramedullary nail with interlocking femoral neck screw without abnormal perihardware lucency. Fracture site is in anatomic alignment. No acute fracture. IMPRESSION: Right femoral ORIF hardware without adverse features. Electronically Signed   By: Ulyses Jarred M.D.   On: 04/04/2018 22:18   Dg Hip Unilat  With Pelvis 2-3 Views Right  Result Date: 03/22/2018 CLINICAL DATA:  84 year old female with fall. EXAM: DG HIP (WITH OR WITHOUT PELVIS) 2-3V LEFT; DG HIP (WITH OR WITHOUT PELVIS) 2-3V RIGHT COMPARISON:  None. FINDINGS: There is a comminuted and mildly displaced intertrochanteric fracture of the right femur with mild proximal migration of the femoral diaphysis. There is no fracture or dislocation  of the left hip. Sclerotic area involving the right inferior pubic ramus, age indeterminate, possibly chronic. Clinical correlation is recommended. There is advanced osteopenia. There is degenerative changes of the lower lumbar spine. The soft tissues are grossly unremarkable. IMPRESSION: Mildly displaced intertrochanteric fracture of the right femur. No dislocation. Electronically Signed   By: Anner Crete M.D.   On: 03/22/2018 01:39   Dg Femur, Min 2 Views Right  Result Date: 03/23/2018 CLINICAL DATA:  Right hip fracture EXAM: DG C-ARM 61-120 MIN; RIGHT FEMUR 2 VIEWS COMPARISON:  03/22/2018 FINDINGS: Total fluoroscopy time was 3 minutes 24 seconds. Six low resolution intraoperative spot views of the right hip. Images demonstrate intramedullary rod and screw fixation of the right femur for intertrochanteric fracture. IMPRESSION: Intraoperative fluoroscopic assistance provided during right hip surgery Electronically Signed   By: Donavan Foil M.D.   On: 03/23/2018 19:18   Xr Femur, Min 2 Views Right  Result Date: 03/30/2018 2 views of the right hip show intact hardware from fixation of a complex comminuted trochanteric proximal femur fracture with significant subtrochanteric extension.  There is  no evidence of hardware failure or complicating features.   Lab Data:  CBC: Recent Labs  Lab 04/04/18 2124 04/05/18 0723  WBC 13.0* 11.0*  NEUTROABS 12.0*  --   HGB 7.7* 7.7*  HCT 25.3* 25.5*  MCV 96.6 97.7  PLT 332 712   Basic Metabolic Panel: Recent Labs  Lab 04/04/18 2124 04/05/18 0218 04/05/18 0723  NA 133*  --  134*  K 4.9  --  4.7  CL 93*  --  96*  CO2 26  --  26  GLUCOSE 280*  --  160*  BUN 44*  --  45*  CREATININE 2.15*  --  2.14*  CALCIUM 8.3*  --  7.9*  MG  --  1.5*  --   PHOS  --  4.2  --    GFR: Estimated Creatinine Clearance: 16.7 mL/min (A) (by C-G formula based on SCr of 2.14 mg/dL (H)). Liver Function Tests: Recent Labs  Lab 04/04/18 2124  AST 26  ALT 17  ALKPHOS 89  BILITOT 0.9  PROT 6.0*  ALBUMIN 2.4*   No results for input(s): LIPASE, AMYLASE in the last 168 hours. No results for input(s): AMMONIA in the last 168 hours. Coagulation Profile: Recent Labs  Lab 04/05/18 0548 04/05/18 0723  INR 4.01* 3.59   Cardiac Enzymes: Recent Labs  Lab 04/05/18 0218 04/05/18 0723  TROPONINI 0.36* 0.31*   BNP (last 3 results) No results for input(s): PROBNP in the last 8760 hours. HbA1C: No results for input(s): HGBA1C in the last 72 hours. CBG: Recent Labs  Lab 04/05/18 0432 04/05/18 0741  GLUCAP 166* 147*   Lipid Profile: Recent Labs    04/05/18 0218  CHOL 77  HDL 34*  LDLCALC 33  TRIG 52  CHOLHDL 2.3   Thyroid Function Tests: No results for input(s): TSH, T4TOTAL, FREET4, T3FREE, THYROIDAB in the last 72 hours. Anemia Panel: No results for input(s): VITAMINB12, FOLATE, FERRITIN, TIBC, IRON, RETICCTPCT in the last 72 hours. Urine analysis:    Component Value Date/Time   COLORURINE YELLOW 04/04/2018 2113   APPEARANCEUR CLOUDY (A) 04/04/2018 2113   LABSPEC 1.025 04/04/2018 2113   PHURINE 5.0 04/04/2018 2113   GLUCOSEU NEGATIVE 04/04/2018 2113   HGBUR MODERATE (A) 04/04/2018 2113   BILIRUBINUR NEGATIVE 04/04/2018 2113    [place] NEGATIVE 04/04/2018 2113   PROTEINUR 30 (A) 04/04/2018 2113   UROBILINOGEN 0.2 02/10/2015 1034  NITRITE NEGATIVE 04/04/2018 2113   LEUKOCYTESUR SMALL (A) 04/04/2018 2113     Estill Cotta M.D. [facility] 04/05/2018, 10:48 AM  Pager: [phone] Between 7am to 7pm - call Pager - [phone]  After 7pm go to [email or web address] - password [email or web address]  Call night coverage person covering after 7pm

## 2018-04-06 DIAGNOSIS — I481 Persistent atrial fibrillation: Secondary | ICD-10-CM

## 2018-04-06 LAB — GLUCOSE, CAPILLARY
GLUCOSE-CAPILLARY: 79 mg/dL (ref 65–99)
GLUCOSE-CAPILLARY: 132 mg/dL — AB (ref 65–99)
GLUCOSE-CAPILLARY: 150 mg/dL — AB (ref 65–99)
GLUCOSE-CAPILLARY: 172 mg/dL — AB (ref 65–99)

## 2018-04-06 LAB — HEMOGLOBIN AND HEMATOCRIT, BLOOD
HEMATOCRIT: 30.8 % — AB (ref 36.0–46.0)
Hemoglobin: 10.1 g/dL — ABNORMAL LOW (ref 12.0–15.0)

## 2018-04-06 LAB — BASIC METABOLIC PANEL
Anion gap: 11 (ref 5–15)
BUN: 45 mg/dL — AB (ref 6–20)
CO2: 24 mmol/L (ref 22–32)
Calcium: 7.8 mg/dL — ABNORMAL LOW (ref 8.9–10.3)
Chloride: 98 mmol/L — ABNORMAL LOW (ref 101–111)
Creatinine, Ser: 2.13 mg/dL — ABNORMAL HIGH (ref 0.44–1.00)
GFR calc Af Amer: 22 mL/min — ABNORMAL LOW (ref >60)
GFR, EST NON AFRICAN AMERICAN: 19 mL/min — AB (ref >60)
Glucose, Bld: 83 mg/dL (ref 65–99)
POTASSIUM: 4.4 mmol/L (ref 3.5–5.1)
Sodium: 133 mmol/L — ABNORMAL LOW (ref 135–145)

## 2018-04-06 LAB — URINE CULTURE: Culture: 40000 — AB

## 2018-04-06 LAB — FOLATE: FOLATE: 35 ng/mL (ref >5.9)

## 2018-04-06 LAB — FERRITIN: FERRITIN: 221 ng/mL (ref 11–307)

## 2018-04-06 LAB — IRON AND TIBC
IRON: 38 ug/dL (ref 28–170)
SATURATION RATIOS: 19 % (ref 10.4–31.8)
TIBC: 197 ug/dL — AB (ref 250–450)
UIBC: 159 ug/dL

## 2018-04-06 LAB — VITAMIN B12: VITAMIN B 12: 470 pg/mL (ref 180–914)

## 2018-04-06 LAB — LEGIONELLA PNEUMOPHILA SEROGP 1 UR AG: L. pneumophila Serogp 1 Ur Ag: NEGATIVE

## 2018-04-06 LAB — CBC
HEMATOCRIT: 21.7 % — AB (ref 36.0–46.0)
Hemoglobin: 6.8 g/dL — CL (ref 12.0–15.0)
MCH: 30 pg (ref 26.0–34.0)
MCHC: 31.3 g/dL (ref 30.0–36.0)
MCV: 95.6 fL (ref 78.0–100.0)
PLATELETS: 312 10*3/uL (ref 150–400)
RBC: 2.27 MIL/uL — AB (ref 3.87–5.11)
RDW: 17.6 % — ABNORMAL HIGH (ref 11.5–15.5)
WBC: 8.4 10*3/uL (ref 4.0–10.5)

## 2018-04-06 LAB — PROTIME-INR
INR: 2.99
Prothrombin Time: 30.8 seconds — ABNORMAL HIGH (ref 11.4–15.2)

## 2018-04-06 LAB — PREPARE RBC (CROSSMATCH)

## 2018-04-06 LAB — RETICULOCYTES
RBC.: 3.36 MIL/uL — ABNORMAL LOW (ref 3.87–5.11)
Retic Count, Absolute: 70.6 10*3/uL (ref 19.0–186.0)
Retic Ct Pct: 2.1 % (ref 0.4–3.1)

## 2018-04-06 MED ORDER — AMIODARONE HCL 200 MG PO TABS
200.0000 mg | ORAL_TABLET | Freq: Two times a day (BID) | ORAL | Status: DC
  Administered 2018-04-06 – 2018-04-08 (×5): 200 mg via ORAL
  Filled 2018-04-06 (×7): qty 1

## 2018-04-06 MED ORDER — SODIUM CHLORIDE 0.9 % IV SOLN: Freq: Once | INTRAVENOUS | Status: DC

## 2018-04-06 NOTE — NC FL2 (Signed)
[place] LEVEL OF CARE SCREENING TOOL     IDENTIFICATION  Patient Name: Kylie Sanchez Birthdate: [birth year] Sex: female Admission Date (Current Location): 04/04/2018  [facility] and [place] Number:  Herbalist and Address:  [facility] [place] [address], [place], [place] [address]      Provider Number: [id number]  Attending Physician Name and Address:  Mendel Corning, MD  Relative Name and Phone Number:  Patria Warzecha - brother; [phone]    Current Level of Care: Hospital Recommended Level of Care: Skilled Nursing Facility(From [facility]) Prior Approval Number:    Date Approved/Denied:   PASRR Number: [id number] A(#ff. 12/05/14)  Discharge Plan: SNF    Current Diagnoses: Patient Active Problem List   Diagnosis Date Noted  . Acute metabolic encephalopathy 62/83/6629  . Demand ischemia ([place])   . Sepsis due to pneumonia ([place]) 04/04/2018  . Acute lower UTI 03/24/2018  . Closed right hip fracture, initial encounter ([place]) 03/22/2018  . Hip fracture ([place]) 03/22/2018  . Closed fracture of right hip ([place])   . Warfarin-induced coagulopathy ([place])   . Mild cognitive impairment 01/22/2018  . Afib ([place]) 11/17/2017  . Depression, major, recurrent, in partial remission ([place]) 11/17/2017  . Palliative care encounter   . Goals of care, counseling/discussion   . Pressure injury of skin 05/22/2017  . Dilated cardiomyopathy ([place]) 05/20/2017  . Elevated troponin 05/19/2017  . Asymptomatic stenosis of right carotid artery 05/13/2017  . H/O enucleation of left eyeball 10/30/2015  . Macular degeneration, right eye 10/30/2015  . Pseudophakia, right eye 10/30/2015  . Basal cell carcinoma 02/10/2015  . DM neuropathy, type II diabetes mellitus ([place]) 02/10/2015  . Persistent atrial fibrillation ([place]) 02/03/2015  . Type 2 diabetes mellitus with hyperlipidemia ([place]) 11/24/2014  . T2_NIDDM w/Stage 3 CKD (GFR 40 ml/min) 11/08/2014  . Vitamin D  deficiency 11/08/2014  . Medication management 11/08/2014  . BP (high blood pressure) 11/08/2014  . Hyperlipidemia   . Sick sinus syndrome ([place])   . Retinopathy   . Long term current use of anticoagulant therapy 04/30/2012  . Tachycardia-bradycardia ([place]) 04/19/2012  . PAF (paroxysmal atrial fibrillation) ([place]) 04/18/2011  . HCD (hypertensive cardiovascular disease) 04/18/2011    Orientation RESPIRATION BLADDER Height & Weight     Self  O2(2 Liters Oxygen) Continent, Indwelling catheter(Placed 03/26/18) Weight: 167 lb (75.8 kg) Height:  5\' 2"  (157.5 cm)  BEHAVIORAL SYMPTOMS/MOOD NEUROLOGICAL BOWEL NUTRITION STATUS      Continent Diet(DYS 1)  AMBULATORY STATUS COMMUNICATION OF NEEDS Skin   Total Care(Patient unable to ambulate with physical therapist) Verbally Normal                       Personal Care Assistance Level of Assistance  Bathing, Feeding, Dressing Bathing Assistance: Maximum assistance Feeding assistance: Independent Dressing Assistance: Maximum assistance     Functional Limitations Info  Sight, Hearing, Speech Sight Info: Impaired Hearing Info: Impaired Speech Info: Adequate    SPECIAL CARE FACTORS FREQUENCY  PT (By licensed PT), Speech therapy     PT Frequency: Evaluated 5/26 and a minimum of 2X per week therapy recommended during acute inpatient stay       Speech Therapy Frequency: Evaluated 5/26 and DYS 1 diet recommended      Contractures Contractures Info: Not present    Additional Factors Info  Code Status, Allergies, Isolation Precautions Code Status Info: DNR Allergies Info: No known allergies     Isolation Precautions Info:  MRSA nares     Current Medications (04/06/2018):  This is the current hospital active medication list Current Facility-Administered Medications  Medication Dose Route Frequency Provider Last Rate Last Dose  . 0.9 %  sodium chloride infusion   Intravenous Continuous Hugelmeyer, Alexis, DO 75 mL/hr at 04/05/18  1714    . 0.9 %  sodium chloride infusion   Intravenous Once Blount, Scarlette Shorts T, NP      . acetaminophen (TYLENOL) tablet 650 mg  650 mg Oral Q6H PRN Hugelmeyer, Alexis, DO       Or  . acetaminophen (TYLENOL) suppository 650 mg  650 mg Rectal Q6H PRN Hugelmeyer, Alexis, DO      . amiodarone (PACERONE) tablet 200 mg  200 mg Oral BID Allred, James, MD   200 mg at 04/06/18 1112  . atorvastatin (LIPITOR) tablet 40 mg  40 mg Oral q1800 Hugelmeyer, Alexis, DO   40 mg at 04/05/18 1713  . bisacodyl (DULCOLAX) EC tablet 5 mg  5 mg Oral Daily PRN Hugelmeyer, Alexis, DO      . escitalopram (LEXAPRO) tablet 5 mg  5 mg Oral Daily Hugelmeyer, Alexis, DO   5 mg at 04/06/18 1112  . HYDROcodone-acetaminophen (NORCO/VICODIN) 5-325 MG per tablet 1-2 tablet  1-2 tablet Oral Q4H PRN Hugelmeyer, Alexis, DO   2 tablet at 04/06/18 1112  . ipratropium-albuterol (DUONEB) 0.5-2.5 (3) MG/3ML nebulizer solution 3 mL  3 mL Nebulization Q6H PRN Hugelmeyer, Alexis, DO      . magnesium citrate solution 1 Bottle  1 Bottle Oral Once PRN Hugelmeyer, Alexis, DO      . MEDLINE mouth rinse  15 mL Mouth Rinse BID Hugelmeyer, Alexis, DO   15 mL at 04/05/18 2200  . nitroGLYCERIN (NITROSTAT) SL tablet 0.4 mg  0.4 mg Sublingual Q5 min PRN Hugelmeyer, Alexis, DO      . ondansetron (ZOFRAN) tablet 4 mg  4 mg Oral Q6H PRN Hugelmeyer, Alexis, DO       Or  . ondansetron (ZOFRAN) injection 4 mg  4 mg Intravenous Q6H PRN Hugelmeyer, Alexis, DO      . piperacillin-tazobactam (ZOSYN) IVPB 2.25 g  2.25 g Intravenous Q8H Pierce, Dwayne A, RPH   Stopped at 04/06/18 0539  . senna-docusate (Senokot-S) tablet 1 tablet  1 tablet Oral QHS PRN Hugelmeyer, Alexis, DO   1 tablet at 04/06/18 1112  . vancomycin (VANCOCIN) IVPB 1000 mg/200 mL premix  1,000 mg Intravenous Q48H Theotis Burrow, RPH   Stopped at 04/04/18 2323     Discharge Medications: Please see discharge summary for a list of discharge medications.  Relevant Imaging Results:  Relevant Lab  Results:   Additional Information ss#[id number]  Sable Feil, LCSW

## 2018-04-06 NOTE — Progress Notes (Signed)
Progress Note   Subjective   Doing well today, the patient denies CP or SOB.  No new concerns  Inpatient Medications    Scheduled Meds: . atorvastatin  40 mg Oral q1800  . escitalopram  5 mg Oral Daily  . mouth rinse  15 mL Mouth Rinse BID   Continuous Infusions: . sodium chloride 75 mL/hr at 04/05/18 1714  . sodium chloride    . piperacillin-tazobactam (ZOSYN)  IV Stopped (04/06/18 0539)  . vancomycin Stopped (04/04/18 2323)   PRN Meds: acetaminophen **OR** acetaminophen, bisacodyl, HYDROcodone-acetaminophen, ipratropium-albuterol, magnesium citrate, nitroGLYCERIN, ondansetron **OR** ondansetron (ZOFRAN) IV, senna-docusate   Vital Signs    Vitals:   04/06/18 0650 04/06/18 0825 04/06/18 0825 04/06/18 0902  BP: (!) 108/57 129/66 129/66 135/63  Pulse: 97  (!) 104 (!) 104  Resp:  15  16  Temp: 98.1 F (36.7 C) 99.2 F (37.3 C)  99.2 F (37.3 C)  TempSrc: Oral Oral  Oral  SpO2: 92% 99% 100% 99%  Weight:      Height:        Intake/Output Summary (Last 24 hours) at 04/06/2018 1041 Last data filed at 04/06/2018 0900 Gross per 24 hour  Intake 555 ml  Output 1100 ml  Net -545 ml   Filed Weights   04/05/18 0048  Weight: 167 lb (75.8 kg)    Telemetry    Sinus has converted to afib this am - Personally Reviewed  Physical Exam   GEN- The patient is elderly appearing, alert and oriented x 3 today.   Head- normocephalic, atraumatic Eyes-  S/p L eye removal Ears- hearing intact Oropharynx- clear Neck- supple, Lungs- Clear to ausculation bilaterally, normal work of breathing Heart- irregular rate and rhythm  GI- soft, NT, ND, + BS Extremities- no clubbing, cyanosis, or edema  MS- diffuse atrophy Skin- no rash or lesion Psych- euthymic mood, full affect Neuro- strength and sensation are intact   Labs    Chemistry Recent Labs  Lab 04/04/18 2124 04/05/18 0723 04/06/18 0355  NA 133* 134* 133*  K 4.9 4.7 4.4  CL 93* 96* 98*  CO2 26 26 24   GLUCOSE 280*  160* 83  BUN 44* 45* 45*  CREATININE 2.15* 2.14* 2.13*  CALCIUM 8.3* 7.9* 7.8*  PROT 6.0*  --   --   ALBUMIN 2.4*  --   --   AST 26  --   --   ALT 17  --   --   ALKPHOS 89  --   --   BILITOT 0.9  --   --   GFRNONAA 19* 19* 19*  GFRAA 22* 22* 22*  ANIONGAP 14 12 11      Hematology Recent Labs  Lab 04/04/18 2124 04/05/18 0723 04/06/18 0355  WBC 13.0* 11.0* 8.4  RBC 2.62* 2.61* 2.27*  HGB 7.7* 7.7* 6.8*  HCT 25.3* 25.5* 21.7*  MCV 96.6 97.7 95.6  MCH 29.4 29.5 30.0  MCHC 30.4 30.2 31.3  RDW 18.2* 18.1* 17.6*  PLT 332 249 312    Cardiac Enzymes Recent Labs  Lab 04/05/18 0218 04/05/18 0723 04/05/18 1304  TROPONINI 0.36* 0.31* 0.38*    Recent Labs  Lab 04/04/18 2155  TROPIPOC 0.26*       Patient Profile     84 y.o. female with a hx of pAF (previously on warfarin), symptomatic bradycardia s/p PPM, carotid artery stenosis,history of presumed tachycardia mediated cardiomyopathy (EF 35% by echo in 05/2017, improved to 60-65% by repeat imaging in 06/2017), HTN, HLD  and Type 2 DMwho is being seen for the evaluation ofelevated troponin in the setting of pneumonia.   Assessment & Plan    1.  Paroxysmal atrial fibrillation In afib today (onset this am). Would start amiodarone 200mg  PO BID for short term rate control Not currently a candidate for anticoagulation due to anemia  2. Elevated troponin I agree with Dr Domenic Polite that this is likely demand ischemia in the setting of acute medical illness/ anemia Not a candidate for anticoagulation or CV procedures  3. Pneumonia following recent hip fracture Per primary team Blood cultures negative  4. Acute anemia receiving PRBCs currently Coumadin is on hold Primary team evaluating cause of her anemia  Her long term prognosis is poor A conservative approach may be best  Thompson Grayer MD, [facility] 04/06/2018 10:41 AM

## 2018-04-06 NOTE — Progress Notes (Signed)
CRITICAL VALUE ALERT  Critical Value:  HGB 6.8  Date & Time Notied:  04/06/2018  0516  Provider Notified: Kennon Holter  Orders Received/Actions taken: orders were put in to transfuse PRBC  Eleanora Neighbor, RN

## 2018-04-06 NOTE — Progress Notes (Signed)
Kazuko Clemence is the nephew and POA for [employer].

## 2018-04-06 NOTE — Progress Notes (Signed)
[facility]                                                                              Patient Demographics  Kylie Sanchez, is a 84 y.o. female, DOB - Mar 19, 1925, LKT:[id number]  Admit date - 04/04/2018   Admitting Physician Harvie Bridge, DO  Outpatient Primary MD for the patient is Unk Pinto, MD  Outpatient specialists:   LOS - 2  days   Medical records reviewed and are as summarized below:    Chief Complaint  Patient presents with  . Altered Mental Status       Brief summary   Patient is a 84 year old female with history of CKD stage III, hypertension, hyperlipidemia, paroxysmal atrial fib, diabetes, recent right hip fracture status post repair, was discharged to skilled nursing facility.  Patient was sent from the skilled nursing facility with a fever of 103 F, lethargy, altered mental status.   X-ray showed patchy infiltrates in both lung bases, patient was admitted for sepsis.   Assessment & Plan    Principal Problem:   Sepsis due to pneumonia (HCC)/ HCAP -Patient met sepsis criteria at the time of admission due to lactic acidosis, acute kidney injury, fevers, leukocytosis, source likely due to pneumonia -Continue IV vancomycin and Zosyn, blood cultures negative so far urine strep antigen negative -Much more alert and awake, oriented today, daughter at the bedside -SLP evaluation done, recommended dysphagia 1 diet with thin liquids  Active Problems:   Persistent atrial fibrillation (HCC) with history of tachycardia mediated cardiomyopathy -Was in normal sinus rhythm, back in atrial fibrillation today - Cardiology following, started on amiodarone 200 mg twice a day - Not a anticoagulation candidate due to anemia  Acute on chronic anemia -Baseline hemoglobin 7.9-9.5, unclear etiology -Hemoglobin  down to 6.8 today, obtain anemia panel, FOBT, check PT/INR -Transfuse 2 units packed RBCs  Elevated troponins, abnormal EKG -Cardiology  consulted, Likely due to demand ischemia from #1 sepsis   type II diabetes mellitus (HCC) -CBGs stable, currently on dysphagia 1 diet    H/O enucleation of left eyeball -No acute issues     Closed fracture of right hip (HCC) -Status post surgical repair, will start physical therapy once patient is alert and oriented, will need skilled nursing facility for rehab    Acute metabolic encephalopathy -Improving, alert and awake, daughter at the bedside -Much more oriented today -Ammonia level, CT head negative for any acute CVA, atrophy and chronic microvascular ischemic changes  Code Status: DNR  DVT Prophylaxis:  SCD's Family Communication: Discussed in detail with the patient, all imaging results, lab results explained to the patient and daughter at the bedside   Disposition Plan:   Time Spent in minutes   35 minutes  Procedures:  None  Consultants:   Cardiac  Antimicrobials:      Medications  Scheduled Meds: . amiodarone  200 mg Oral BID  . atorvastatin  40 mg Oral q1800  . escitalopram  5 mg Oral Daily  . mouth rinse  15 mL Mouth Rinse BID   Continuous Infusions: . sodium chloride 75 mL/hr at 04/05/18 1714  . sodium chloride    .  piperacillin-tazobactam (ZOSYN)  IV Stopped (04/06/18 0539)  . vancomycin Stopped (04/04/18 2323)   PRN Meds:.acetaminophen **OR** acetaminophen, bisacodyl, HYDROcodone-acetaminophen, ipratropium-albuterol, magnesium citrate, nitroGLYCERIN, ondansetron **OR** ondansetron (ZOFRAN) IV, senna-docusate   Antibiotics   Anti-infectives (From admission, onward)   Start     Dose/Rate Route Frequency Ordered Stop   04/05/18 0600  piperacillin-tazobactam (ZOSYN) IVPB 2.25 g     2.25 g 100 mL/hr over 30 Minutes Intravenous Every 8 hours 04/04/18 2214     04/04/18 2215  vancomycin (VANCOCIN) IVPB 1000 mg/200 mL premix     1,000 mg 200 mL/hr over 60 Minutes Intravenous Every 48 hours 04/04/18 2214     04/04/18 2115  piperacillin-tazobactam  (ZOSYN) IVPB 3.375 g     3.375 g 100 mL/hr over 30 Minutes Intravenous  Once 04/04/18 2109 04/04/18 2221        Subjective:   Kylie Sanchez was seen and examined today.  Daughter at the bedside, patient much more alert and awake and oriented today.  No fevers or chills.  No acute issues.  Denies any pain, nausea or vomiting, abdominal pain.     Objective:   Vitals:   04/06/18 0825 04/06/18 0902 04/06/18 1125 04/06/18 1126  BP: 129/66 135/63 113/67 128/72  Pulse: (!) 104 (!) 104 (!) 106 (!) 117  Resp:  16 16   Temp:  99.2 F (37.3 C) 97.9 F (36.6 C)   TempSrc:  Oral Oral   SpO2: 100% 99% 96%   Weight:      Height:        Intake/Output Summary (Last 24 hours) at 04/06/2018 1332 Last data filed at 04/06/2018 1125 Gross per 24 hour  Intake 942.5 ml  Output 1100 ml  Net -157.5 ml     Wt Readings from Last 3 Encounters:  04/05/18 75.8 kg (167 lb)  03/21/18 63.5 kg (140 lb)  01/22/18 63 kg (139 lb)     Exam   General: Alert and oriented, appears close to her baseline  Eyes: Enucleation left   HEENT:  Atraumatic, normocephalic  Cardiovascular: S1 S2 auscultated, iregular. No pedal edema b/l  Respiratory: Creased breath sound at the bases  Gastrointestinal: Soft, nontender, nondistended, + bowel sounds  Ext: no pedal edema bilaterally  Neuro: no new deficits  Musculoskeletal: No digital cyanosis, clubbing  Skin: No rashes  Psych: appears close to her baseline   Data Reviewed:  I have personally reviewed following labs and imaging studies  Micro Results Recent Results (from the past 240 hour(s))  Urine culture     Status: Abnormal   Collection Time: 04/04/18  9:07 PM  Result Value Ref Range Status   Specimen Description URINE, RANDOM  Final   Special Requests   Final    NONE Performed at [facility], [address] [place], [place] [address]    Culture 40,000 COLONIES/mL YEAST (A)  Final   Report Status 04/06/2018 FINAL  Final  Blood  Culture (routine x 2)     Status: None (Preliminary result)   Collection Time: 04/04/18  9:27 PM  Result Value Ref Range Status   Specimen Description BLOOD LEFT ARM  Final   Special Requests   Final    BOTTLES DRAWN AEROBIC AND ANAEROBIC Blood Culture adequate volume   Culture   Final    NO GROWTH < 12 HOURS Performed at [facility], [place] [address] [place], [place] [address]    Report Status PENDING  Incomplete    Radiology  Reports Dg Chest 1 View  Result Date: 03/22/2018 CLINICAL DATA:  Closed fracture of the right hip.  Preop. EXAM: CHEST  1 VIEW COMPARISON:  05/18/2017 FINDINGS: Heart is top-normal in size. There is aortic atherosclerosis. No definite aneurysm. Left-sided pacemaker apparatus right atrial and right ventricular leads are redemonstrated. There is scarring overlying the left costophrenic angle and to a lesser degree the right costophrenic angle. Clearing of right-sided pleural effusions since previous exam. No overt pulmonary edema or pulmonary consolidation. Intact bony thorax. 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 IMPRESSION: No pneumonia.  Bibasilar scarring.  Aortic atherosclerosis. Electronically Signed   By: Ashley Royalty M.D.   On: 03/22/2018 01:42   Ct Head Wo Contrast  Result Date: 04/05/2018 CLINICAL DATA:  Altered mental status. EXAM: CT HEAD WITHOUT CONTRAST TECHNIQUE: Contiguous axial images were obtained from the base of the skull through the vertex without intravenous contrast. COMPARISON:  03/22/2018 FINDINGS: Brain: No evidence of acute infarction, hemorrhage, hydrocephalus, extra-axial collection or mass lesion/mass effect. There is ventricular and sulcal enlargement reflecting generalized atrophy stable from the prior study. Patchy areas of white matter hypoattenuation are also noted consistent with mild chronic microvascular ischemic change. Vascular: No hyperdense vessel or unexpected calcification. Skull: Status post left orbital  exenteration, stable from prior CT. No skull fracture or suspicious lesion. Sinuses/Orbits: Visualize right globe and orbit unremarkable. No acute findings in the remaining sinuses. Clear mastoid air cells. Other: None. IMPRESSION: 1. No acute intracranial abnormalities. 2. Atrophy and chronic microvascular ischemic change, stable from prior study. 3. Status post left orbital surgery, also unchanged. Electronically Signed   By: Lajean Manes M.D.   On: 04/05/2018 12:25   Ct Head Wo Contrast  Result Date: 03/22/2018 CLINICAL DATA:  Status post fall last night. EXAM: CT HEAD WITHOUT CONTRAST TECHNIQUE: Contiguous axial images were obtained from the base of the skull through the vertex without intravenous contrast. COMPARISON:  May 13, 2017 FINDINGS: Brain: No evidence of acute infarction, hemorrhage, hydrocephalus, extra-axial collection or mass lesion/mass effect. There is chronic diffuse atrophy. Chronic bilateral periventricular white matter small vessel ischemic changes noted. Small old right basal ganglia lacunar infarction is identified unchanged. Vascular: No hyperdense vessel or unexpected calcification. 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 Sinuses/Orbits: No acute abnormality. 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 Other: Right parietal scalp swelling and hematoma is noted. IMPRESSION: No focal acute intracranial abnormality identified. Right parietal scalp swelling hematoma is identified. Electronically Signed   By: Abelardo Diesel M.D.   On: 03/22/2018 07:53   Dg Chest Port 1 View  Result Date: 04/04/2018 CLINICAL DATA:  Fever and decreased responsiveness EXAM: PORTABLE CHEST 1 VIEW COMPARISON:  Mar 22, 2018 FINDINGS: There is patchy infiltrate in both lung bases. Heart is upper normal in size with pulmonary vascularity normal. There is aortic atherosclerosis. Pacemaker leads are attached the right atrium and right  ventricle. Bones osteoporotic. No adenopathy. IMPRESSION: Patchy infiltrate felt to represent pneumonia in both lung bases. Lungs elsewhere clear. Stable cardiac silhouette. There is aortic atherosclerosis. Aortic Atherosclerosis (ICD10-I70.0). Electronically Signed   By: Lowella Grip III M.D.   On: 04/04/2018 21:30   Dg C-arm 1-60 Min  Result Date: 03/23/2018 CLINICAL DATA:  Right hip fracture EXAM: DG C-ARM 61-120 MIN; RIGHT FEMUR 2 VIEWS COMPARISON:  03/22/2018 FINDINGS: Total fluoroscopy time was 3 minutes 24 seconds. Six low resolution intraoperative spot views of the right hip. Images demonstrate intramedullary rod  and screw fixation of the right femur for intertrochanteric fracture. IMPRESSION: Intraoperative fluoroscopic assistance provided during right hip surgery Electronically Signed   By: Donavan Foil M.D.   On: 03/23/2018 19:18   Dg Hip Unilat With Pelvis 2-3 Views Left  Result Date: 03/22/2018 CLINICAL DATA:  84 year old female with fall. EXAM: DG HIP (WITH OR WITHOUT PELVIS) 2-3V LEFT; DG HIP (WITH OR WITHOUT PELVIS) 2-3V RIGHT COMPARISON:  None. FINDINGS: There is a comminuted and mildly displaced intertrochanteric fracture of the right femur with mild proximal migration of the femoral diaphysis. There is no fracture or dislocation of the left hip. Sclerotic area involving the right inferior pubic ramus, age indeterminate, possibly chronic. Clinical correlation is recommended. There is advanced osteopenia. There is degenerative changes of the lower lumbar spine. The soft tissues are grossly unremarkable. IMPRESSION: Mildly displaced intertrochanteric fracture of the right femur. No dislocation. Electronically Signed   By: Anner Crete M.D.   On: 03/22/2018 01:39   Dg Hip Unilat With Pelvis 2-3 Views Right  Result Date: 04/04/2018 CLINICAL DATA:  Recent right hip ORIF EXAM: DG HIP (WITH OR WITHOUT PELVIS) 2-3V RIGHT COMPARISON:  03/22/2018 FINDINGS: Antegrade intramedullary nail  with interlocking femoral neck screw without abnormal perihardware lucency. Fracture site is in anatomic alignment. No acute fracture. IMPRESSION: Right femoral ORIF hardware without adverse features. Electronically Signed   By: Ulyses Jarred M.D.   On: 04/04/2018 22:18   Dg Hip Unilat  With Pelvis 2-3 Views Right  Result Date: 03/22/2018 CLINICAL DATA:  84 year old female with fall. EXAM: DG HIP (WITH OR WITHOUT PELVIS) 2-3V LEFT; DG HIP (WITH OR WITHOUT PELVIS) 2-3V RIGHT COMPARISON:  None. FINDINGS: There is a comminuted and mildly displaced intertrochanteric fracture of the right femur with mild proximal migration of the femoral diaphysis. There is no fracture or dislocation of the left hip. Sclerotic area involving the right inferior pubic ramus, age indeterminate, possibly chronic. Clinical correlation is recommended. There is advanced osteopenia. There is degenerative changes of the lower lumbar spine. The soft tissues are grossly unremarkable. IMPRESSION: Mildly displaced intertrochanteric fracture of the right femur. No dislocation. Electronically Signed   By: Anner Crete M.D.   On: 03/22/2018 01:39   Dg Femur, Min 2 Views Right  Result Date: 03/23/2018 CLINICAL DATA:  Right hip fracture EXAM: DG C-ARM 61-120 MIN; RIGHT FEMUR 2 VIEWS COMPARISON:  03/22/2018 FINDINGS: Total fluoroscopy time was 3 minutes 24 seconds. Six low resolution intraoperative spot views of the right hip. Images demonstrate intramedullary rod and screw fixation of the right femur for intertrochanteric fracture. IMPRESSION: Intraoperative fluoroscopic assistance provided during right hip surgery Electronically Signed   By: Donavan Foil M.D.   On: 03/23/2018 19:18   Xr Femur, Min 2 Views Right  Result Date: 03/30/2018 2 views of the right hip show intact hardware from fixation of a complex comminuted trochanteric proximal femur fracture with significant subtrochanteric extension.  There is no evidence of hardware  failure or complicating features.   Lab Data:  CBC: Recent Labs  Lab 04/04/18 2124 04/05/18 0723 04/06/18 0355  WBC 13.0* 11.0* 8.4  NEUTROABS 12.0*  --   --   HGB 7.7* 7.7* 6.8*  HCT 25.3* 25.5* 21.7*  MCV 96.6 97.7 95.6  PLT 332 249 628   Basic Metabolic Panel: Recent Labs  Lab 04/04/18 2124 04/05/18 0218 04/05/18 0723 04/06/18 0355  NA 133*  --  134* 133*  K 4.9  --  4.7 4.4  CL 93*  --  96* 98*  CO2 26  --  26 24  GLUCOSE 280*  --  160* 83  BUN 44*  --  45* 45*  CREATININE 2.15*  --  2.14* 2.13*  CALCIUM 8.3*  --  7.9* 7.8*  MG  --  1.5*  --   --   PHOS  --  4.2  --   --    GFR: Estimated Creatinine Clearance: 16.7 mL/min (A) (by C-G formula based on SCr of 2.13 mg/dL (H)). Liver Function Tests: Recent Labs  Lab 04/04/18 2124  AST 26  ALT 17  ALKPHOS 89  BILITOT 0.9  PROT 6.0*  ALBUMIN 2.4*   No results for input(s): LIPASE, AMYLASE in the last 168 hours. Recent Labs  Lab 04/05/18 1304  AMMONIA 18   Coagulation Profile: Recent Labs  Lab 04/05/18 0548 04/05/18 0723  INR 4.01* 3.59   Cardiac Enzymes: Recent Labs  Lab 04/05/18 0218 04/05/18 0723 04/05/18 1304  TROPONINI 0.36* 0.31* 0.38*   BNP (last 3 results) No results for input(s): PROBNP in the last 8760 hours. HbA1C: No results for input(s): HGBA1C in the last 72 hours. CBG: Recent Labs  Lab 04/05/18 1230 04/05/18 1715 04/05/18 2117 04/06/18 0739 04/06/18 1129  GLUCAP 117* 133* 119* 79 132*   Lipid Profile: Recent Labs    04/05/18 0218  CHOL 77  HDL 34*  LDLCALC 33  TRIG 52  CHOLHDL 2.3   Thyroid Function Tests: No results for input(s): TSH, T4TOTAL, FREET4, T3FREE, THYROIDAB in the last 72 hours. Anemia Panel: No results for input(s): VITAMINB12, FOLATE, FERRITIN, TIBC, IRON, RETICCTPCT in the last 72 hours. Urine analysis:    Component Value Date/Time   COLORURINE YELLOW 04/04/2018 2113   APPEARANCEUR CLOUDY (A) 04/04/2018 2113   LABSPEC 1.025 04/04/2018  2113   PHURINE 5.0 04/04/2018 2113   GLUCOSEU NEGATIVE 04/04/2018 2113   HGBUR MODERATE (A) 04/04/2018 2113   BILIRUBINUR NEGATIVE 04/04/2018 2113   [place] NEGATIVE 04/04/2018 2113   PROTEINUR 30 (A) 04/04/2018 2113   UROBILINOGEN 0.2 02/10/2015 1034   NITRITE NEGATIVE 04/04/2018 2113   LEUKOCYTESUR SMALL (A) 04/04/2018 2113     Ripudeep Rai M.D. [facility] 04/06/2018, 1:32 PM  Pager: [phone] Between 7am to 7pm - call Pager - [phone]  After 7pm go to [email or web address] - password [email or web address]  Call night coverage person covering after 7pm

## 2018-04-06 NOTE — Clinical Social Work Note (Signed)
Clinical Social Work Assessment  Patient Details  Name: Kylie Sanchez MRN: [id number] Date of Birth: 17-Aug-1925  Date of referral:  04/06/18               Reason for consult:  Facility Placement, Discharge Planning                Permission sought to share information with:  Family Supports Permission granted to share information::  Yes, Verbal Permission Granted  Name::     Kylie Sanchez   Agency::     Relationship::  [employer] Information:  [phone]  Housing/Transportation Living arrangements for the past 2 months:  [place] of Information:  Other (Comment Required), Patient(Caregiver Kylie Sanchez at the bedside, along with patient's brother Kylie Sanchez) Patient Interpreter Needed:  None Criminal Activity/Legal Involvement Pertinent to Current Situation/Hospitalization:  No - Comment as needed Significant Relationships:  Other(Comment), Other Family Members, Siblings(Caregiver Kylie Sanchez at bedside) Lives with:  Siblings(Brother Kylie Sanchez) Do you feel safe going back to the place where you live?  No(Patient agreeable to returning to [facility] before going home) Need for family participation in patient care:  Yes (Comment)  Care giving concerns:  Patient, nor nephew Kylie Sanchez or caregiver Kylie Sanchez reported any concerns regarding Kylie Sanchez's care at [facility], where she is receiving rehab.  Social Worker assessment / plan: CSW visited room and talked with patient and her caregiver Kylie Sanchez regarding discharge disposition. Patient greeted CSW and allowed caregiver to talk with CSW. Per Kylie Sanchez, patient's nephew is Kylie Sanchez and he is POA. Kylie Sanchez reported that she is one of the primary caregivers and there are 2 other persons that also work with patient and her brother Kylie Sanchez who live together.  Kylie Sanchez indicated that the care has been 7 days a week from 9 am to 6:30 pm, however the family may extend the care once Kylie Sanchez goes home. When asked, Kylie Sanchez responded that she has been working with  patient and her brother for awhile. Caregiver encouraged CSW to talk with nephew regarding patient.  CSW talked with Kylie Sanchez by phone and he also confirmed that the plan is for patient to return to [facility]. CSW asked if they have talked with facility regarding bed availability, since it is unknown when patient will be ready for discharge. CSW advised Kylie Sanchez that if [facility] does not have a bed available when patient ready for discharge, they will have to choose another facility and Kylie Sanchez indicated that his wife has talked with someone at [facility].   Employment status:  Retired Health visitor, Managed Care PT Recommendations:  Kylie Sanchez / Referral to community resources:  Other (Comment Required)(None needed or requested as patient from [facility] and plans to return there at discharge)  Patient/Family's Response to care:  No concerns expressed regarding patient's care during hospitalization.  Patient/Family's Understanding of and Emotional Response to Diagnosis, Current Treatment, and Prognosis:  Nephew appeared to have an understanding of patient's need for continued rehab at discharge and reported that contact has been made with SNF regarding bed availability.  Emotional Assessment Appearance:  Appears stated age Attitude/Demeanor/Rapport:  Other(Appropriate) Affect (typically observed):  Appropriate, Pleasant Orientation:  Oriented to Self Alcohol / Substance use:  Never Used Psych involvement (Current and /or in the community):  No (Comment)  Discharge Needs  Concerns to be addressed:  Discharge Planning Concerns Readmission within the last 30 days:  Yes Current discharge risk:  None Barriers to Discharge:  Continued Medical Work up   [employer] [name], LCSW 04/06/2018, 2:50 PM

## 2018-04-07 ENCOUNTER — Inpatient Hospital Stay (HOSPITAL_COMMUNITY): Payer: Medicare Other

## 2018-04-07 DIAGNOSIS — I248 Other forms of acute ischemic heart disease: Secondary | ICD-10-CM

## 2018-04-07 DIAGNOSIS — G9341 Metabolic encephalopathy: Secondary | ICD-10-CM

## 2018-04-07 LAB — TYPE AND SCREEN
ABO/RH(D): O POS
ANTIBODY SCREEN: NEGATIVE
UNIT DIVISION: 0
Unit division: 0

## 2018-04-07 LAB — BPAM RBC
Blood Product Expiration Date: 201906182359
Blood Product Expiration Date: 201906182359
ISSUE DATE / TIME: 201905270838
ISSUE DATE / TIME: 201905271423
UNIT TYPE AND RH: 5100
Unit Type and Rh: 5100

## 2018-04-07 LAB — GLUCOSE, CAPILLARY
GLUCOSE-CAPILLARY: 112 mg/dL — AB (ref 65–99)
GLUCOSE-CAPILLARY: 112 mg/dL — AB (ref 65–99)
GLUCOSE-CAPILLARY: 103 mg/dL — AB (ref 65–99)
Glucose-Capillary: 103 mg/dL — ABNORMAL HIGH (ref 65–99)
Glucose-Capillary: 152 mg/dL — ABNORMAL HIGH (ref 65–99)

## 2018-04-07 LAB — BASIC METABOLIC PANEL
ANION GAP: 11 (ref 5–15)
BUN: 47 mg/dL — ABNORMAL HIGH (ref 6–20)
CHLORIDE: 100 mmol/L — AB (ref 101–111)
CO2: 25 mmol/L (ref 22–32)
CREATININE: 2.31 mg/dL — AB (ref 0.44–1.00)
Calcium: 8.1 mg/dL — ABNORMAL LOW (ref 8.9–10.3)
GFR calc non Af Amer: 17 mL/min — ABNORMAL LOW (ref >60)
GFR, EST AFRICAN AMERICAN: 20 mL/min — AB (ref >60)
Glucose, Bld: 122 mg/dL — ABNORMAL HIGH (ref 65–99)
POTASSIUM: 4.3 mmol/L (ref 3.5–5.1)
SODIUM: 136 mmol/L (ref 135–145)

## 2018-04-07 LAB — CBC
HCT: 31.3 % — ABNORMAL LOW (ref 36.0–46.0)
HEMOGLOBIN: 9.9 g/dL — AB (ref 12.0–15.0)
MCH: 29.2 pg (ref 26.0–34.0)
MCHC: 31.6 g/dL (ref 30.0–36.0)
MCV: 92.3 fL (ref 78.0–100.0)
PLATELETS: 335 10*3/uL (ref 150–400)
RBC: 3.39 MIL/uL — AB (ref 3.87–5.11)
RDW: 16.5 % — ABNORMAL HIGH (ref 11.5–15.5)
WBC: 6.4 10*3/uL (ref 4.0–10.5)

## 2018-04-07 LAB — ECHOCARDIOGRAM COMPLETE
HEIGHTINCHES: 62 in
Weight: 2672 oz

## 2018-04-07 MED ORDER — IOPAMIDOL (ISOVUE-300) INJECTION 61%
INTRAVENOUS | Status: AC
  Filled 2018-04-07: qty 30

## 2018-04-07 MED ORDER — METOPROLOL TARTRATE 12.5 MG HALF TABLET
12.5000 mg | ORAL_TABLET | Freq: Two times a day (BID) | ORAL | Status: DC
  Administered 2018-04-07 – 2018-04-15 (×17): 12.5 mg via ORAL
  Filled 2018-04-07 (×17): qty 1

## 2018-04-07 MED ORDER — PERFLUTREN LIPID MICROSPHERE
1.0000 mL | INTRAVENOUS | Status: AC | PRN
  Administered 2018-04-07: 2 mL via INTRAVENOUS
  Filled 2018-04-07: qty 10

## 2018-04-07 MED ORDER — IOPAMIDOL (ISOVUE-300) INJECTION 61%
INTRAVENOUS | Status: AC
  Filled 2018-04-07: qty 50

## 2018-04-07 NOTE — Progress Notes (Signed)
Pharmacy Antibiotic Note  Kylie Sanchez is a 84 y.o. female admitted on 04/04/2018 with concern for HCAP/sepsis  Pharmacy has been consulted for Vancomycin and Zosyn dosing.  The patient's current dosing remains appropriate for the patient's estimated CrCl<20 ml/min. Will check a VR prior to the next dose if continues.   Plan: - Cont Vancomycin 1g IV every 48 hours - Cont Zosyn 2.25g IV every 8 hours - Will continue to follow renal function, culture results, LOT, and antibiotic de-escalation plans   Height: 5\' 2"  (157.5 cm) Weight: 167 lb (75.8 kg) IBW/kg (Calculated) : 50.1  Temp (24hrs), Avg:98.3 F (36.8 C), Min:98.1 F (36.7 C), Max:98.5 F (36.9 C)  Recent Labs  Lab 04/04/18 2124 04/04/18 2156 04/05/18 0218 04/05/18 0723 04/06/18 0355 04/07/18 0542  WBC 13.0*  --   --  11.0* 8.4 6.4  CREATININE 2.15*  --   --  2.14* 2.13* 2.31*  LATICACIDVEN  --  2.82* 1.9  --   --   --     Estimated Creatinine Clearance: 15.4 mL/min (A) (by C-G formula based on SCr of 2.31 mg/dL (H)).    Allergies  Allergen Reactions  . No Known Allergies    Vanc 5/25 >> Zosyn 5/25 >>  5/25 UCx >> 40k yeast 5/25 BCx >> [id number]  Thank you for allowing pharmacy to be a part of this patient's care.  Alycia Rossetti, PharmD, BCPS Clinical Pharmacist Pager: [phone] Clinical phone for 04/07/2018 from 7a-3:30p: [phone] If after 3:30p, please call main pharmacy at: [phone] 04/07/2018 1:42 PM

## 2018-04-07 NOTE — Progress Notes (Signed)
Progress Note   Subjective   Doing well today, the patient denies CP or SOB.  No new concerns  Inpatient Medications    Scheduled Meds: . amiodarone  200 mg Oral BID  . atorvastatin  40 mg Oral q1800  . escitalopram  5 mg Oral Daily  . mouth rinse  15 mL Mouth Rinse BID   Continuous Infusions: . sodium chloride 75 mL/hr at 04/07/18 0505  . sodium chloride    . piperacillin-tazobactam (ZOSYN)  IV Stopped (04/07/18 0535)  . vancomycin Stopped (04/06/18 2331)   PRN Meds: acetaminophen **OR** acetaminophen, bisacodyl, HYDROcodone-acetaminophen, ipratropium-albuterol, magnesium citrate, nitroGLYCERIN, ondansetron **OR** ondansetron (ZOFRAN) IV, senna-docusate   Vital Signs    Vitals:   04/06/18 1446 04/06/18 1730 04/06/18 2010 04/07/18 0537  BP: 101/60 127/62 122/67 119/89  Pulse: 91 86 86 (!) 101  Resp: 16 16 16 16   Temp: 98.1 F (36.7 C) 98.2 F (36.8 C) 98.5 F (36.9 C) 98.2 F (36.8 C)  TempSrc: Tympanic Oral  Oral  SpO2: 99% 100% 98% 92%  Weight:      Height:        Intake/Output Summary (Last 24 hours) at 04/07/2018 7035 Last data filed at 04/07/2018 0600 Gross per 24 hour  Intake 5442.5 ml  Output 350 ml  Net 5092.5 ml   Filed Weights   04/05/18 0048  Weight: 167 lb (75.8 kg)    Telemetry    Sinus has converted to SR just 10 minutes ago at 10:18 am - Personally Reviewed  Physical Exam   GEN- The patient is elderly appearing, alert and oriented x 3 today.   Head- normocephalic, atraumatic Eyes-  S/p L eye removal Ears- hearing intact Oropharynx- clear Neck- supple, Lungs- Clear to ausculation bilaterally, normal work of breathing Heart- RRR  GI- soft, NT, ND, + BS Extremities- no clubbing, cyanosis, or edema  MS- diffuse atrophy Skin- no rash or lesion Psych- euthymic mood, full affect Neuro- strength and sensation are intact   Labs    Chemistry Recent Labs  Lab 04/04/18 2124 04/05/18 0723 04/06/18 0355 04/07/18 0542  NA 133*  134* 133* 136  K 4.9 4.7 4.4 4.3  CL 93* 96* 98* 100*  CO2 26 26 24 25   GLUCOSE 280* 160* 83 122*  BUN 44* 45* 45* 47*  CREATININE 2.15* 2.14* 2.13* 2.31*  CALCIUM 8.3* 7.9* 7.8* 8.1*  PROT 6.0*  --   --   --   ALBUMIN 2.4*  --   --   --   AST 26  --   --   --   ALT 17  --   --   --   ALKPHOS 89  --   --   --   BILITOT 0.9  --   --   --   GFRNONAA 19* 19* 19* 17*  GFRAA 22* 22* 22* 20*  ANIONGAP 14 12 11 11      Hematology Recent Labs  Lab 04/05/18 0723 04/06/18 0355 04/06/18 1950 04/07/18 0542  WBC 11.0* 8.4  --  6.4  RBC 2.61* 2.27* 3.36* 3.39*  HGB 7.7* 6.8* 10.1* 9.9*  HCT 25.5* 21.7* 30.8* 31.3*  MCV 97.7 95.6  --  92.3  MCH 29.5 30.0  --  29.2  MCHC 30.2 31.3  --  31.6  RDW 18.1* 17.6*  --  16.5*  PLT 249 312  --  335    Cardiac Enzymes Recent Labs  Lab 04/05/18 0218 04/05/18 0723 04/05/18 1304  TROPONINI 0.36*  0.31* 0.38*    Recent Labs  Lab 04/04/18 2155  TROPIPOC 0.26*       Patient Profile     84 y.o. 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   Assessment & Plan    1.  Paroxysmal atrial fibrillation In SR - spontaneous cardioversion at 10: 18 am today Would continue amiodarone 200mg  PO BID fr another 5 days then switch to amiodarone 200 mg po daily, I would add a low dose metoprolol 12.5 mg po BID Not currently a candidate for anticoagulation due to anemia  2. Elevated troponin I agree with Dr Domenic Polite that this is likely demand ischemia in the setting of acute medical illness/ anemia Not a candidate for anticoagulation or CV procedures  3. Pneumonia following recent hip fracture Per primary team Blood cultures negative  4. Acute  anemia receiving PRBCs currently Coumadin is on hold Primary team evaluating cause of her anemia  Her long term prognosis is poor A conservative approach may be best  Thompson Grayer MD, [facility] 04/07/2018 9:25 AM

## 2018-04-07 NOTE — Progress Notes (Signed)
[facility]                                                                              Patient Demographics  Kylie Sanchez, is a 84 y.o. female, DOB - [birth year], KGY:[id number]  Admit date - 04/04/2018   Admitting Physician Harvie Bridge, DO  Outpatient Primary MD for the patient is Unk Pinto, MD  Outpatient specialists:   LOS - 3  days   Medical records reviewed and are as summarized below:    Chief Complaint  Patient presents with  . Altered Mental Status       Brief summary   Patient is a 84 year old female with history of CKD stage III, hypertension, hyperlipidemia, paroxysmal atrial fib, diabetes, recent right hip fracture status post repair, was discharged to skilled nursing facility.  Patient was sent from the skilled nursing facility with a fever of 103 F, lethargy, altered mental status.   X-ray showed patchy infiltrates in both lung bases, patient was admitted for sepsis.   Assessment & Plan    Principal Problem:   Sepsis due to pneumonia (HCC)/ HCAP -Patient met sepsis criteria at the time of admission due to lactic acidosis, acute kidney injury, fevers, leukocytosis, source likely due to pneumonia -Continue IV vancomycin and Zosyn,  blood cultures negative so far -Urine strep antigen negative, urine Legionella antigen negative -SLP evaluation done, recommended dysphagia 1 diet with thin liquids  Active Problems:   Persistent atrial fibrillation (HCC) with history of tachycardia mediated cardiomyopathy -Patient was back in atrial fibrillation yesterday, spontaneously in normal sinus rhythm today - Cardiology following, recommended amiodarone 200 mg twice a day for 5 days then switch to amiodarone 200 mg daily - Started on metoprolol 12.46m twice a day - Not a anticoagulation candidate due to anemia  Acute on chronic anemia, normocytic -Baseline hemoglobin 7.9-9.5, unclear etiology -Hemoglobin down to 6.8 on 5/27, transfused 2  units packed RBCs -Patient is stable today, FOBT pending. - CT abdomen pending (complaining of abdominal pain and lower quadrant "pressure", bladder scan with 57 cc)  Elevated troponins, abnormal EKG -Cardiology following, Likely due to demand ischemia from #1 sepsis -2D echo results pending   type II diabetes mellitus ([place] -CBGs stable, currently on dysphagia 1 diet    H/O enucleation of left eyeball -No acute issues     Closed fracture of right hip (HCC) -Status post surgical repair, start PT    Acute metabolic encephalopathy -Alert and oriented, caregiver at the bedside, appears to be close to baseline  -Ammonia level 18, CT head negative for any acute CVA, atrophy and chronic microvascular ischemic changes  Code Status: DNR  DVT Prophylaxis:  SCD's Family Communication: Discussed in detail with the patient, all imaging results, lab results explained to the patient and care giver at the bedside   Disposition Plan:   Time Spent in minutes   35 minutes  Procedures:  2D echo: Results pending  Consultants:   Cardiology  Antimicrobials:      Medications  Scheduled Meds: . amiodarone  200 mg Oral BID  . atorvastatin  40 mg Oral q1800  . escitalopram  5 mg Oral  Daily  . iopamidol      . iopamidol      . mouth rinse  15 mL Mouth Rinse BID  . metoprolol tartrate  12.5 mg Oral BID   Continuous Infusions: . sodium chloride 75 mL/hr at 04/07/18 0505  . sodium chloride    . piperacillin-tazobactam (ZOSYN)  IV Stopped (04/07/18 0535)  . vancomycin Stopped (04/06/18 2331)   PRN Meds:.acetaminophen **OR** acetaminophen, bisacodyl, HYDROcodone-acetaminophen, ipratropium-albuterol, magnesium citrate, nitroGLYCERIN, ondansetron **OR** ondansetron (ZOFRAN) IV, perflutren lipid microspheres (DEFINITY) IV suspension, senna-docusate   Antibiotics   Anti-infectives (From admission, onward)   Start     Dose/Rate Route Frequency Ordered Stop   04/05/18 0600   piperacillin-tazobactam (ZOSYN) IVPB 2.25 g     2.25 g 100 mL/hr over 30 Minutes Intravenous Every 8 hours 04/04/18 2214     04/04/18 2215  vancomycin (VANCOCIN) IVPB 1000 mg/200 mL premix     1,000 mg 200 mL/hr over 60 Minutes Intravenous Every 48 hours 04/04/18 2214     04/04/18 2115  piperacillin-tazobactam (ZOSYN) IVPB 3.375 g     3.375 g 100 mL/hr over 30 Minutes Intravenous  Once 04/04/18 2109 04/04/18 2221        Subjective:   Kylie Sanchez was seen and examined today.  Complaining of lower quadrant suprapubic abdominal pressure.  Bedside bladder ultrasound only with 57 cc.  No fevers or chills.    Denies any chest pain, shortness of breath, weakness.   Objective:   Vitals:   04/06/18 1730 04/06/18 2010 04/07/18 0537 04/07/18 0948  BP: 127/62 122/67 119/89 (!) 160/148  Pulse: 86 86 (!) 101 (!) 123  Resp: 16 16 16    Temp: 98.2 F (36.8 C) 98.5 F (36.9 C) 98.2 F (36.8 C) 98.4 F (36.9 C)  TempSrc: Oral  Oral Oral  SpO2: 100% 98% 92% 90%  Weight:      Height:        Intake/Output Summary (Last 24 hours) at 04/07/2018 1229 Last data filed at 04/07/2018 0600 Gross per 24 hour  Intake 5055 ml  Output 350 ml  Net 4705 ml     Wt Readings from Last 3 Encounters:  04/05/18 75.8 kg (167 lb)  03/21/18 63.5 kg (140 lb)  01/22/18 63 kg (139 lb)     Exam General: Alert and oriented, appears close to baseline Cardiovascular: S1 S2 auscultated,Regular rate and rhythm. No pedal edema b/l Respiratory: Clear to auscultation bilaterally, no wheezing, rales or rhonchi Gastrointestinal: Soft, mild tenderness in the suprapubic region, +BS Ext: no pedal edema bilaterally Neuro:  Musculoskeletal: No digital cyanosis, clubbing Skin: No rashes Psych: much more alert and oriented   Data Reviewed:  I have personally reviewed following labs and imaging studies  Micro Results Recent Results (from the past 240 hour(s))  Urine culture     Status: Abnormal   Collection Time:  04/04/18  9:07 PM  Result Value Ref Range Status   Specimen Description URINE, RANDOM  Final   Special Requests   Final    NONE Performed at [facility], [address] [place], [place] [address]    Culture 40,000 COLONIES/mL YEAST (A)  Final   Report Status 04/06/2018 FINAL  Final  Blood Culture (routine x 2)     Status: None (Preliminary result)   Collection Time: 04/04/18  9:27 PM  Result Value Ref Range Status   Specimen Description BLOOD LEFT ARM  Final   Special Requests   Final    BOTTLES  DRAWN AEROBIC AND ANAEROBIC Blood Culture adequate volume   Culture   Final    NO GROWTH 2 DAYS Performed at [facility], [place] [address] [place], [place] [address]    Report Status PENDING  Incomplete  Blood Culture (routine x 2)     Status: None (Preliminary result)   Collection Time: 04/05/18  2:10 AM  Result Value Ref Range Status   Specimen Description BLOOD RIGHT ARM  Final   Special Requests   Final    BOTTLES DRAWN AEROBIC ONLY Blood Culture results may not be optimal due to an inadequate volume of blood received in culture bottles   Culture   Final    NO GROWTH 1 DAY Performed at [facility], [place] [address], [place], [place] [address]    Report Status PENDING  Incomplete    Radiology Reports Dg Chest 1 View  Result Date: 03/22/2018 CLINICAL DATA:  Closed fracture of the right hip.  Preop. EXAM: CHEST  1 VIEW COMPARISON:  05/18/2017 FINDINGS: Heart is top-normal in size. There is aortic atherosclerosis. No definite aneurysm. Left-sided pacemaker apparatus right atrial and right ventricular leads are redemonstrated. There is scarring overlying the left costophrenic angle and to a lesser degree the right costophrenic angle. Clearing of right-sided pleural effusions since previous exam. No overt pulmonary edema or pulmonary consolidation. Intact bony thorax. 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 IMPRESSION: No pneumonia.  Bibasilar scarring.  Aortic  atherosclerosis. Electronically Signed   By: Ashley Royalty M.D.   On: 03/22/2018 01:42   Ct Head Wo Contrast  Result Date: 04/05/2018 CLINICAL DATA:  Altered mental status. EXAM: CT HEAD WITHOUT CONTRAST TECHNIQUE: Contiguous axial images were obtained from the base of the skull through the vertex without intravenous contrast. COMPARISON:  03/22/2018 FINDINGS: Brain: No evidence of acute infarction, hemorrhage, hydrocephalus, extra-axial collection or mass lesion/mass effect. There is ventricular and sulcal enlargement reflecting generalized atrophy stable from the prior study. Patchy areas of white matter hypoattenuation are also noted consistent with mild chronic microvascular ischemic change. Vascular: No hyperdense vessel or unexpected calcification. Skull: Status post left orbital exenteration, stable from prior CT. No skull fracture or suspicious lesion. Sinuses/Orbits: Visualize right globe and orbit unremarkable. No acute findings in the remaining sinuses. Clear mastoid air cells. Other: None. IMPRESSION: 1. No acute intracranial abnormalities. 2. Atrophy and chronic microvascular ischemic change, stable from prior study. 3. Status post left orbital surgery, also unchanged. Electronically Signed   By: Lajean Manes M.D.   On: 04/05/2018 12:25   Ct Head Wo Contrast  Result Date: 03/22/2018 CLINICAL DATA:  Status post fall last night. EXAM: CT HEAD WITHOUT CONTRAST TECHNIQUE: Contiguous axial images were obtained from the base of the skull through the vertex without intravenous contrast. COMPARISON:  May 13, 2017 FINDINGS: Brain: No evidence of acute infarction, hemorrhage, hydrocephalus, extra-axial collection or mass lesion/mass effect. There is chronic diffuse atrophy. Chronic bilateral periventricular white matter small vessel ischemic changes noted. Small old right basal ganglia lacunar infarction is identified unchanged. Vascular: No hyperdense vessel or unexpected calcification. Skull: Deformity  from prior left enucleation and left sinonasal surgery are noted. Sinuses/Orbits: No acute abnormality. 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 Other: Right parietal scalp swelling and hematoma is noted. IMPRESSION: No focal acute intracranial abnormality identified. Right parietal scalp swelling hematoma is identified. Electronically Signed   By: Abelardo Diesel M.D.   On: 03/22/2018 07:53   Dg Chest Port 1  View  Result Date: 04/04/2018 CLINICAL DATA:  Fever and decreased responsiveness EXAM: PORTABLE CHEST 1 VIEW COMPARISON:  Mar 22, 2018 FINDINGS: There is patchy infiltrate in both lung bases. Heart is upper normal in size with pulmonary vascularity normal. There is aortic atherosclerosis. Pacemaker leads are attached the right atrium and right ventricle. Bones osteoporotic. No adenopathy. IMPRESSION: Patchy infiltrate felt to represent pneumonia in both lung bases. Lungs elsewhere clear. Stable cardiac silhouette. There is aortic atherosclerosis. Aortic Atherosclerosis (ICD10-I70.0). Electronically Signed   By: Lowella Grip III M.D.   On: 04/04/2018 21:30   Dg C-arm 1-60 Min  Result Date: 03/23/2018 CLINICAL DATA:  Right hip fracture EXAM: DG C-ARM 61-120 MIN; RIGHT FEMUR 2 VIEWS COMPARISON:  03/22/2018 FINDINGS: Total fluoroscopy time was 3 minutes 24 seconds. Six low resolution intraoperative spot views of the right hip. Images demonstrate intramedullary rod and screw fixation of the right femur for intertrochanteric fracture. IMPRESSION: Intraoperative fluoroscopic assistance provided during right hip surgery Electronically Signed   By: Donavan Foil M.D.   On: 03/23/2018 19:18   Dg Hip Unilat With Pelvis 2-3 Views Left  Result Date: 03/22/2018 CLINICAL DATA:  84 year old female with fall. EXAM: DG HIP (WITH OR WITHOUT PELVIS) 2-3V LEFT; DG HIP (WITH OR WITHOUT PELVIS) 2-3V RIGHT COMPARISON:  None. FINDINGS: There is a comminuted and mildly displaced  intertrochanteric fracture of the right femur with mild proximal migration of the femoral diaphysis. There is no fracture or dislocation of the left hip. Sclerotic area involving the right inferior pubic ramus, age indeterminate, possibly chronic. Clinical correlation is recommended. There is advanced osteopenia. There is degenerative changes of the lower lumbar spine. The soft tissues are grossly unremarkable. IMPRESSION: Mildly displaced intertrochanteric fracture of the right femur. No dislocation. Electronically Signed   By: Anner Crete M.D.   On: 03/22/2018 01:39   Dg Hip Unilat With Pelvis 2-3 Views Right  Result Date: 04/04/2018 CLINICAL DATA:  Recent right hip ORIF EXAM: DG HIP (WITH OR WITHOUT PELVIS) 2-3V RIGHT COMPARISON:  03/22/2018 FINDINGS: Antegrade intramedullary nail with interlocking femoral neck screw without abnormal perihardware lucency. Fracture site is in anatomic alignment. No acute fracture. IMPRESSION: Right femoral ORIF hardware without adverse features. Electronically Signed   By: Ulyses Jarred M.D.   On: 04/04/2018 22:18   Dg Hip Unilat  With Pelvis 2-3 Views Right  Result Date: 03/22/2018 CLINICAL DATA:  84 year old female with fall. EXAM: DG HIP (WITH OR WITHOUT PELVIS) 2-3V LEFT; DG HIP (WITH OR WITHOUT PELVIS) 2-3V RIGHT COMPARISON:  None. FINDINGS: There is a comminuted and mildly displaced intertrochanteric fracture of the right femur with mild proximal migration of the femoral diaphysis. There is no fracture or dislocation of the left hip. Sclerotic area involving the right inferior pubic ramus, age indeterminate, possibly chronic. Clinical correlation is recommended. There is advanced osteopenia. There is degenerative changes of the lower lumbar spine. The soft tissues are grossly unremarkable. IMPRESSION: Mildly displaced intertrochanteric fracture of the right femur. No dislocation. Electronically Signed   By: Anner Crete M.D.   On: 03/22/2018 01:39   Dg  Femur, Min 2 Views Right  Result Date: 03/23/2018 CLINICAL DATA:  Right hip fracture EXAM: DG C-ARM 61-120 MIN; RIGHT FEMUR 2 VIEWS COMPARISON:  03/22/2018 FINDINGS: Total fluoroscopy time was 3 minutes 24 seconds. Six low resolution intraoperative spot views of the right hip. Images demonstrate intramedullary rod and screw fixation of the right femur for intertrochanteric fracture. IMPRESSION: Intraoperative fluoroscopic assistance provided during right hip surgery  Electronically Signed   By: Donavan Foil M.D.   On: 03/23/2018 19:18   Xr Femur, Min 2 Views Right  Result Date: 03/30/2018 2 views of the right hip show intact hardware from fixation of a complex comminuted trochanteric proximal femur fracture with significant subtrochanteric extension.  There is no evidence of hardware failure or complicating features.   Lab Data:  CBC: Recent Labs  Lab 04/04/18 2124 04/05/18 0723 04/06/18 0355 04/06/18 1950 04/07/18 0542  WBC 13.0* 11.0* 8.4  --  6.4  NEUTROABS 12.0*  --   --   --   --   HGB 7.7* 7.7* 6.8* 10.1* 9.9*  HCT 25.3* 25.5* 21.7* 30.8* 31.3*  MCV 96.6 97.7 95.6  --  92.3  PLT 332 249 312  --  924   Basic Metabolic Panel: Recent Labs  Lab 04/04/18 2124 04/05/18 0218 04/05/18 0723 04/06/18 0355 04/07/18 0542  NA 133*  --  134* 133* 136  K 4.9  --  4.7 4.4 4.3  CL 93*  --  96* 98* 100*  CO2 26  --  26 24 25   GLUCOSE 280*  --  160* 83 122*  BUN 44*  --  45* 45* 47*  CREATININE 2.15*  --  2.14* 2.13* 2.31*  CALCIUM 8.3*  --  7.9* 7.8* 8.1*  MG  --  1.5*  --   --   --   PHOS  --  4.2  --   --   --    GFR: Estimated Creatinine Clearance: 15.4 mL/min (A) (by C-G formula based on SCr of 2.31 mg/dL (H)). Liver Function Tests: Recent Labs  Lab 04/04/18 2124  AST 26  ALT 17  ALKPHOS 89  BILITOT 0.9  PROT 6.0*  ALBUMIN 2.4*   No results for input(s): LIPASE, AMYLASE in the last 168 hours. Recent Labs  Lab 04/05/18 1304  AMMONIA 18   Coagulation  Profile: Recent Labs  Lab 04/05/18 0548 04/05/18 0723 04/06/18 1950  INR 4.01* 3.59 2.99   Cardiac Enzymes: Recent Labs  Lab 04/05/18 0218 04/05/18 0723 04/05/18 1304  TROPONINI 0.36* 0.31* 0.38*   BNP (last 3 results) No results for input(s): PROBNP in the last 8760 hours. HbA1C: No results for input(s): HGBA1C in the last 72 hours. CBG: Recent Labs  Lab 04/06/18 1129 04/06/18 1646 04/06/18 2010 04/07/18 0011 04/07/18 0728  GLUCAP 132* 150* 172* 152* 103*   Lipid Profile: Recent Labs    04/05/18 0218  CHOL 77  HDL 34*  LDLCALC 33  TRIG 52  CHOLHDL 2.3   Thyroid Function Tests: No results for input(s): TSH, T4TOTAL, FREET4, T3FREE, THYROIDAB in the last 72 hours. Anemia Panel: Recent Labs    04/06/18 1949 04/06/18 1950  VITAMINB12  --  470  FOLATE 35.0  --   FERRITIN  --  221  TIBC  --  197*  IRON  --  38  RETICCTPCT  --  2.1   Urine analysis:    Component Value Date/Time   COLORURINE YELLOW 04/04/2018 2113   APPEARANCEUR CLOUDY (A) 04/04/2018 2113   LABSPEC 1.025 04/04/2018 2113   PHURINE 5.0 04/04/2018 2113   GLUCOSEU NEGATIVE 04/04/2018 2113   HGBUR MODERATE (A) 04/04/2018 2113   BILIRUBINUR NEGATIVE 04/04/2018 2113   [place] NEGATIVE 04/04/2018 2113   PROTEINUR 30 (A) 04/04/2018 2113   UROBILINOGEN 0.2 02/10/2015 1034   NITRITE NEGATIVE 04/04/2018 2113   LEUKOCYTESUR SMALL (A) 04/04/2018 2113     Ripudeep Rai M.D. [facility]  04/07/2018, 12:29 PM  Pager: [phone] Between 7am to 7pm - call Pager - [phone]  After 7pm go to [email or web address] - password [email or web address]  Call night coverage person covering after 7pm

## 2018-04-07 NOTE — Progress Notes (Signed)
PT Cancellation Note  Patient Details Name: Kylie Sanchez MRN: [id number] DOB: 03/13/1925   Cancelled Treatment:    Reason Eval/Treat Not Completed: Other (comment)   [name] is restless, and quite uncomfortable with reports of feeling pressure in her bladder; Plans for ultrasound;  She is having difficulty settling down, and we have decided to hold PT at this time, and check back at a later date for PT;   Roney Emmalin, [place]  [facility] Pager [phone] Office [phone]    [name] 04/07/2018, 9:51 AM

## 2018-04-07 NOTE — Progress Notes (Signed)
  Echocardiogram 2D Echocardiogram has been performed.  Feras Gardella G Kallum Jorgensen 04/07/2018, 11:16 AM

## 2018-04-08 LAB — BASIC METABOLIC PANEL
ANION GAP: 13 (ref 5–15)
BUN: 46 mg/dL — ABNORMAL HIGH (ref 6–20)
CO2: 26 mmol/L (ref 22–32)
Calcium: 8.5 mg/dL — ABNORMAL LOW (ref 8.9–10.3)
Chloride: 99 mmol/L — ABNORMAL LOW (ref 101–111)
Creatinine, Ser: 2.34 mg/dL — ABNORMAL HIGH (ref 0.44–1.00)
GFR, EST AFRICAN AMERICAN: 20 mL/min — AB (ref >60)
GFR, EST NON AFRICAN AMERICAN: 17 mL/min — AB (ref >60)
GLUCOSE: 84 mg/dL (ref 65–99)
POTASSIUM: 3.8 mmol/L (ref 3.5–5.1)
Sodium: 138 mmol/L (ref 135–145)

## 2018-04-08 LAB — CBC
HCT: 32 % — ABNORMAL LOW (ref 36.0–46.0)
HEMOGLOBIN: 10.1 g/dL — AB (ref 12.0–15.0)
MCH: 30 pg (ref 26.0–34.0)
MCHC: 31.6 g/dL (ref 30.0–36.0)
MCV: 95 fL (ref 78.0–100.0)
Platelets: 404 10*3/uL — ABNORMAL HIGH (ref 150–400)
RBC: 3.37 MIL/uL — AB (ref 3.87–5.11)
RDW: 16.5 % — ABNORMAL HIGH (ref 11.5–15.5)
WBC: 6.5 10*3/uL (ref 4.0–10.5)

## 2018-04-08 LAB — GLUCOSE, CAPILLARY
GLUCOSE-CAPILLARY: 118 mg/dL — AB (ref 65–99)
GLUCOSE-CAPILLARY: 153 mg/dL — AB (ref 65–99)
Glucose-Capillary: 66 mg/dL (ref 65–99)
Glucose-Capillary: 109 mg/dL — ABNORMAL HIGH (ref 65–99)
Glucose-Capillary: 146 mg/dL — ABNORMAL HIGH (ref 65–99)

## 2018-04-08 LAB — PROTIME-INR
INR: 3.18
Prothrombin Time: 32.3 seconds — ABNORMAL HIGH (ref 11.4–15.2)

## 2018-04-08 LAB — CK: Total CK: 53 U/L (ref 38–234)

## 2018-04-08 MED ORDER — SENNOSIDES-DOCUSATE SODIUM 8.6-50 MG PO TABS
1.0000 | ORAL_TABLET | Freq: Two times a day (BID) | ORAL | Status: DC
  Administered 2018-04-08 – 2018-04-14 (×7): 1 via ORAL
  Filled 2018-04-08 (×11): qty 1

## 2018-04-08 MED ORDER — FLEET ENEMA 7-19 GM/118ML RE ENEM
1.0000 | ENEMA | Freq: Every day | RECTAL | Status: DC | PRN
  Filled 2018-04-08: qty 1

## 2018-04-08 MED ORDER — POLYETHYLENE GLYCOL 3350 17 G PO PACK
17.0000 g | PACK | Freq: Two times a day (BID) | ORAL | Status: DC
  Administered 2018-04-08 – 2018-04-12 (×4): 17 g via ORAL
  Filled 2018-04-08 (×9): qty 1

## 2018-04-08 NOTE — Progress Notes (Signed)
  Speech Language Pathology Treatment:    Patient Details Name: Kylie Sanchez MRN: [id number] DOB: 04/25/1925 Today's Date: 04/08/2018 Time: 2536-6440 SLP Time Calculation (min) (ACUTE ONLY): 25 min  Assessment / Plan / Recommendation Clinical Impression  Pt seen for follow up after BSE completed 04/03/18. Pt seated upright upon arrival of SLP, being fed lunch by visitor. Pt reported needing to belch to alleviate esophageal pressure. SLP provided gingerale, which was beneficial to elicit a belch. Pt appears to tolerate puree solids and thin liquids without overt s/s aspiration or oral difficulty. Pt declined solids requiring mastication, stating she couldn't handle that. Pt/visitor also indicate pt has had meds crushed in puree for several years.  ST will sign off at this time. Pt tolerating baseline diet (puree/thin). Lungs stable, pt afebrile. Please reconsult if needs arise.    HPI HPI: Kylie Sanchez is a 84 y.o. female with a known history of CKD3, HTN, HLD, PAF, DM2, recent right hip fracture s/p surgical repair (WBAT) presents to the emergency department for evaluation of AMS.  Patient was in a usual state of health until this past week when she has been lethargic.  Baseline is fully alert and oriented per caregiver report to EDP; workup revealing pneumonia, mildly elevated troponins, acute kidney injuryMBS 05/16/17 revealed no aspiration or penetration, occasional throat clearing noted during exam. Esophageal sweep revealed stasis, appearance of dysmotility. Regular diet, thin liquids recommended with esophageal precautions.      SLP Plan  Discharge SLP treatment due to goals met       Recommendations  Diet recommendations: Dysphagia 1 (puree);Thin liquid Liquids provided via: Straw Medication Administration: Crushed with puree Supervision: Full supervision/cueing for compensatory strategies;Staff to assist with self feeding;Patient able to self feed Compensations: Slow rate;Small  sips/bites;Minimize environmental distractions Postural Changes and/or Swallow Maneuvers: Seated upright 90 degrees;Upright 30-60 min after meal                Oral Care Recommendations: Oral care BID Follow up Recommendations: None SLP Visit Diagnosis: Dysphagia, unspecified (R13.10) Plan: Discharge SLP treatment due to (comment)       GO              Rexanne Inocencio [facility], CCC-SLP Speech Language Pathologist [phone]  Shonna Chock 04/08/2018, 2:55 PM

## 2018-04-08 NOTE — Progress Notes (Signed)
[facility] PROGRESS NOTE  Kylie Sanchez EVO:[id number] DOB: 1925/09/29 DOA: 04/04/2018  PCP: Unk Pinto, MD  Brief History/Interval Summary: 84 year old female with history of CKD stage III, hypertension, hyperlipidemia, paroxysmal atrial fib, diabetes, recent right hip fracture status post repair, was discharged to skilled nursing facility.  Patient was sent from the skilled nursing facility with a fever of 103 F, lethargy, altered mental status.  Chest x-ray showed patchy infiltrates in both lung bases.  Patient was hospitalized for further management.  Reason for Visit: Sepsis  Consultants: Cardiology  Procedures:  Transthoracic echocardiogram Study Conclusions  - Left ventricle: The cavity size was normal. There was mild   concentric hypertrophy. Systolic function was mildly reduced. The   estimated ejection fraction was in the range of 45% to 50%.   Hypokinesis of the anteroseptal, inferior, inferoseptal, and   apical myocardium. Doppler parameters are consistent with   abnormal left ventricular relaxation (grade 1 diastolic   dysfunction). - Aortic valve: Transvalvular velocity was within the normal range.   There was no stenosis. There was no regurgitation. - Mitral valve: Transvalvular velocity was within the normal range.   There was no evidence for stenosis. There was trivial   regurgitation. - Left atrium: The atrium was mildly dilated. - Right ventricle: The cavity size was normal. Wall thickness was   normal. Systolic function was normal. - Atrial septum: No defect or patent foramen ovale was identified   by color flow Doppler. - Tricuspid valve: There was trivial regurgitation. - Pulmonary arteries: Systolic pressure was within the normal   range. PA peak pressure: 28 mm Hg (S). - Pericardium, extracardiac: A small pericardial effusion was   identified posterior to the heart. Features were not consistent   with tamponade  physiology.  Impressions:  - Compared with the echo 06/2017, wall motion abnormalities are new.   Antibiotics: None  Subjective/Interval History: Patient complains of constipation.  Also complains of pain at her right hip at times.  Denies any nausea vomiting.  No abdominal pain.  ROS: Denies any chest pain or shortness of breath.  Objective:  Vital Signs  Vitals:   04/07/18 1601 04/07/18 2129 04/08/18 0423 04/08/18 0818  BP: 126/73 124/85 124/66 132/84  Pulse: (!) 111 95 79 86  Resp: _0 Temp: 98.4 F (36.9 C) 98.2 F (36.8 C)  97.6 F (36.4 C)  TempSrc: Oral   Oral  SpO2: 98% 100% 100% 100%  Weight:      Height:        Intake/Output Summary (Last 24 hours) at 04/08/2018 1303 Last data filed at 04/08/2018 1020 Gross per 24 hour  Intake 160 ml  Output 601 ml  Net -441 ml   Filed Weights   04/05/18 0048  Weight: 75.8 kg (167 lb)    General appearance: alert, cooperative, appears stated age and no distress Head: Normocephalic, without obvious abnormality, atraumatic Resp: clear to auscultation bilaterally Cardio: regular rate and rhythm, S1, S2 normal, no murmur, click, rub or gallop GI: soft, non-tender; bowel sounds normal; no masses,  no organomegaly Extremities: Dressing noted over the right lateral thigh.  No drainage noted.  No bruising. Neurologic: No obvious focal neurological deficits.  Lab Results:  Data Reviewed: I have personally reviewed following labs and imaging studies  CBC: Recent Labs  Lab 04/04/18 2124 04/05/18 0723 04/06/18 0355 04/06/18 1950 04/07/18 0542 04/08/18 0513  WBC 13.0* 11.0* 8.4  --  6.4 6.5  NEUTROABS 12.0*  --   --   --   --   --  HGB 7.7* 7.7* 6.8* 10.1* 9.9* 10.1*  HCT 25.3* 25.5* 21.7* 30.8* 31.3* 32.0*  MCV 96.6 97.7 95.6  --  92.3 95.0  PLT 332 249 312  --  335 404*    Basic Metabolic Panel: Recent Labs  Lab 04/04/18 2124 04/05/18 0218 04/05/18 0723 04/06/18 0355 04/07/18 0542  04/08/18 0513  NA 133*  --  134* 133* 136 138  K 4.9  --  4.7 4.4 4.3 3.8  CL 93*  --  96* 98* 100* 99*  CO2 26  --  _0 GLUCOSE 280*  --  160* 83 122* 84  BUN 44*  --  45* 45* 47* 46*  CREATININE 2.15*  --  2.14* 2.13* 2.31* 2.34*  CALCIUM 8.3*  --  7.9* 7.8* 8.1* 8.5*  MG  --  1.5*  --   --   --   --   PHOS  --  4.2  --   --   --   --     GFR: Estimated Creatinine Clearance: 15.2 mL/min (A) (by C-G formula based on SCr of 2.34 mg/dL (H)).  Liver Function Tests: Recent Labs  Lab 04/04/18 2124  AST 26  ALT 17  ALKPHOS 89  BILITOT 0.9  PROT 6.0*  ALBUMIN 2.4*     Recent Labs  Lab 04/05/18 1304  AMMONIA 18    Coagulation Profile: Recent Labs  Lab 04/05/18 0548 04/05/18 0723 04/06/18 1950 04/08/18 0513  INR 4.01* 3.59 2.99 3.18    Cardiac Enzymes: Recent Labs  Lab 04/05/18 0218 04/05/18 0723 04/05/18 1304  TROPONINI 0.36* 0.31* 0.38*    CBG: Recent Labs  Lab 04/07/18 1731 04/07/18 2130 04/08/18 0805 04/08/18 1035 04/08/18 1217  GLUCAP 112* 103* 66 109* 118*   Anemia Panel: Recent Labs    04/06/18 1949 04/06/18 1950  VITAMINB12  --  470  FOLATE 35.0  --   FERRITIN  --  221  TIBC  --  197*  IRON  --  38  RETICCTPCT  --  2.1    Recent Results (from the past 240 hour(s))  Urine culture     Status: Abnormal   Collection Time: 04/04/18  9:07 PM  Result Value Ref Range Status   Specimen Description URINE, RANDOM  Final   Special Requests   Final    NONE Performed at [facility], [place] [address] [place], [place] [address]    Culture 40,000 COLONIES/mL YEAST (A)  Final   Report Status 04/06/2018 FINAL  Final  Blood Culture (routine x 2)     Status: None (Preliminary result)   Collection Time: 04/04/18  9:27 PM  Result Value Ref Range Status   Specimen Description BLOOD LEFT ARM  Final   Special Requests   Final    BOTTLES DRAWN AEROBIC AND ANAEROBIC Blood Culture adequate volume   Culture   Final    NO GROWTH 3  DAYS Performed at [facility], [place] [address] [place], [place] [address]    Report Status PENDING  Incomplete  Blood Culture (routine x 2)     Status: None (Preliminary result)   Collection Time: 04/05/18  2:10 AM  Result Value Ref Range Status   Specimen Description BLOOD RIGHT ARM  Final   Special Requests   Final    BOTTLES DRAWN AEROBIC ONLY Blood Culture results may not be optimal due to an inadequate volume of blood received in culture bottles   Culture  Final    NO GROWTH 2 DAYS Performed at [facility], [place] [address], [place], [place] [address]    Report Status PENDING  Incomplete      Radiology Studies: Ct Abdomen Pelvis Wo Contrast  Result Date: 04/07/2018 CLINICAL DATA:  Abdominal distension EXAM: CT ABDOMEN AND PELVIS WITHOUT CONTRAST TECHNIQUE: Multidetector CT imaging of the abdomen and pelvis was performed following the standard protocol without IV contrast. COMPARISON:  None. FINDINGS: Lower chest: Bilateral lower lobe consolidation is noted with associated small pleural effusions this is slightly more progressed on the left than the right. Hepatobiliary: Gallbladder is well distended without definitive cholelithiasis. The liver is unremarkable. Pancreas: Unremarkable. No pancreatic ductal dilatation or surrounding inflammatory changes. Spleen: Normal in size without focal abnormality. Adrenals/Urinary Tract: The adrenal glands are within normal limits bilaterally. Scattered lesions are noted within both kidneys likely related to cysts. A few of these are hyperdense in nature likely related to hemorrhage. No obstructive changes are seen. Foley catheter is noted in the bladder. Small amount of air is noted in the bladder likely related to recent instrumentation. Stomach/Bowel: Diverticulosis of the colon is seen without definitive diverticulitis. No obstructive changes are seen. The appendix is within normal limits. Large duodenal diverticulum is noted adjacent to  the head of the pancreas. Vascular/Lymphatic: Aortic atherosclerosis. No enlarged abdominal or pelvic lymph nodes. Reproductive: Uterus and bilateral adnexa are unremarkable. Other: Mild ascites is noted as well as changes of anasarca within the abdominal wall. Musculoskeletal: Degenerative changes of the lumbar spine are seen. Changes of prior femoral fracture on the right are noted with fixation. There is also a fracture of the inferior pubic ramus on the right IMPRESSION: Bilateral lower lobe consolidation with associated effusions worse on the left than the right. Cystic changes within the kidneys. Mild ascites and changes of anasarca. Right femoral fracture with fixation. Inferior pubic ramus fracture is noted as well. Diverticulosis without diverticulitis. Electronically Signed   By: Inez Catalina M.D.   On: 04/07/2018 14:00     Medications:  Scheduled: . amiodarone  200 mg Oral BID  . atorvastatin  40 mg Oral q1800  . escitalopram  5 mg Oral Daily  . mouth rinse  15 mL Mouth Rinse BID  . metoprolol tartrate  12.5 mg Oral BID  . polyethylene glycol  17 g Oral BID  . senna-docusate  1 tablet Oral BID   Continuous: . sodium chloride 75 mL/hr at 04/07/18 0505  . sodium chloride    . piperacillin-tazobactam (ZOSYN)  IV Stopped (04/08/18 0556)  . vancomycin Stopped (04/06/18 2331)   UEA:[id number] **OR** acetaminophen, HYDROcodone-acetaminophen, ipratropium-albuterol, nitroGLYCERIN, ondansetron **OR** ondansetron (ZOFRAN) IV, sodium phosphate  Assessment/Plan:    Healthcare associated pneumonia with sepsis Patient met sepsis criteria at the time of admission with presence of fever, leukocytosis, lactic acidosis, acute kidney injury.  Patient placed on intravenous vancomycin and Zosyn.  Blood cultures have been negative.  Seen by speech therapy.  On dysphagia 1 diet.  Urine strep antigen and urine Legionella are negative.  Will discontinue vancomycin.  Continue only Zosyn for now.   Transition to oral antibiotics in 24 hours.  Persistent atrial fibrillation Patient has been in and out of atrial fibrillation.  Cardiology is following.  Placed on amiodarone.  Also placed on beta-blocker.  Not anticoagulation candidate per cards.  Elevated troponin and abnormal EKG Thought to be demand ischemia due to sepsis.  Cardiology following.  Echocardiogram showed diminished systolic function with wall motion abnormalities.  Consider antiplatelet agents.  Patient is on a beta-blocker.  On statin.  Acute on chronic kidney disease stage 3 Baseline creatinine appears to be around 0.3-1.5.  Rise in creatinine noted.  Could be due to hypovolemia, could be secondary to hemodynamic instability from A. fib, antibiotics.  Check UA.  She is noted to be on gentle IV hydration.  We will check her weight.  Strict ins and outs.  No hydronephrosis noted on CT scan.  UA from 5/25 showed moderate hemoglobin with 0 RBCs.  We will check CK as well.  Acute on chronic anemia, normocytic Baseline hemoglobin 7.9-9.5.  Patient's hemoglobin dropped to 6.8 on 5/27.  She was transfused 2 units of PRBC.  No evidence of overt bleeding.  No concerning findings noted on CT abdomen pelvis.  Ferritin 221.  B12 470.  Folate 35.  Diabetes mellitus type 2 Stable.  Continue to monitor CBGs.  History of closed fracture of the right hip Status post recent surgery.  Dressing to be changed.  PT evaluation.  Acute metabolic encephalopathy Likely due to medical illness.  CT head was unremarkable.  Seems to be close to baseline now.  History of enucleation of the left eye Stable.  Chronic systolic CHF with diastolic dysfunction EF noted to be 45%.  Appears to be stable.  Constipation Laxative agents ordered.  DVT Prophylaxis: SCDs    Code Status: DNR Family Communication: No family at bedside Disposition Plan: Management as outlined above.    LOS: 4 days   [facility] Pager  [phone] 04/08/2018, 1:03 PM  If 7PM-7AM, please contact night-coverage at [email or web address], password [facility]

## 2018-04-08 NOTE — Progress Notes (Signed)
Physical Therapy Treatment Patient Details Name: Kylie Sanchez MRN: [id number] DOB: 04-May-1925 Today's Date: 04/08/2018    History of Present Illness Kylie Sanchez is a 84 y.o. female with a known history of CKD3, HTN, HLD, PAF, DM2, recent right hip fracture s/p surgical repair (WBAT) presents to the emergency department for evaluation of AMS.  Patient was in a usual state of health until this past week when she has been lethargic.  Baseline is fully alert and oriented per caregiver report to EDP; workup revealing pneumonia, mildly elevated troponins, acute kidney injury    PT Comments    Continuing work on functional mobility and activity tolerance;  Kylie Sanchez is still very hesitant to move, but was motivated to get to [facility] to be able to move her bowels; Requiring +2 max assist for transfers, but showing overall imporvement in cognition and activity tolerance compared to PT eval.  Follow Up Recommendations  SNF;Supervision/Assistance - 24 hour     Equipment Recommendations  None recommended by PT    Recommendations for Other Services       Precautions / Restrictions Precautions Precautions: Fall Precaution Comments: no L eye Restrictions RLE Weight Bearing: Weight bearing as tolerated    Mobility  Bed Mobility Overal bed mobility: Needs Assistance Bed Mobility: Supine to Sit     Supine to sit: +2 for physical assistance;Max assist     General bed mobility comments: increased time and effort, cueing for sequencing, heavy physical assistance of two for all aspects  Transfers Overall transfer level: Needs assistance Equipment used: 2 person hand held assist Transfers: Squat Pivot Transfers     Squat pivot transfers: Max assist;+2 physical assistance     General transfer comment: Perfrmed squat pivot transfer x2: bed to BSC, then BSC to recliner; both transfers towards stronger L side; L knee blocked for stabiltiy; pt anxious and yelled throughout transfers, but was somewhat  happy to be able to use the [facility] to move bowels  Ambulation/Gait                 Stairs             Wheelchair Mobility    Modified Rankin (Stroke Patients Only)       Balance     Sitting balance-Leahy Scale: Fair       Standing balance-Leahy Scale: Zero                              Cognition Arousal/Alertness: Awake/alert Behavior During Therapy: Restless Overall Cognitive Status: Impaired/Different from baseline Area of Impairment: Following commands;Safety/judgement;Problem solving                       Following Commands: Follows one step commands inconsistently Safety/Judgement: Decreased awareness of safety;Decreased awareness of deficits   Problem Solving: Difficulty sequencing;Requires verbal cues;Requires tactile cues        Exercises      General Comments        Pertinent Vitals/Pain Pain Assessment: Faces Faces Pain Scale: Hurts whole lot Pain Location: R hip with movement Pain Descriptors / Indicators: Grimacing;Guarding Pain Intervention(s): Monitored during session;Repositioned    Home Living                      Prior Function            PT Goals (current goals can now be found in the care plan section) Acute  Rehab PT Goals Patient Stated Goal: less pain PT Goal Formulation: With patient Time For Goal Achievement: 04/19/18 Potential to Achieve Goals: Fair Progress towards PT goals: Progressing toward goals    Frequency    Min 2X/week      PT Plan Current plan remains appropriate    Co-evaluation              AM-PAC PT "6 Clicks" Daily Activity  Outcome Measure  Difficulty turning over in bed (including adjusting bedclothes, sheets and blankets)?: Unable Difficulty moving from lying on back to sitting on the side of the bed? : Unable Difficulty sitting down on and standing up from a chair with arms (e.g., wheelchair, bedside commode, etc,.)?: Unable Help needed moving to  and from a bed to chair (including a wheelchair)?: Total Help needed walking in hospital room?: Total Help needed climbing 3-5 steps with a railing? : Total 6 Click Score: 6    End of Session Equipment Utilized During Treatment: Oxygen;Gait belt Activity Tolerance: Patient tolerated treatment well;Patient limited by pain Patient left: in chair;with call bell/phone within reach;with family/visitor present Nurse Communication: Mobility status PT Visit Diagnosis: Muscle weakness (generalized) (M62.81);Pain;Other abnormalities of gait and mobility (R26.89) Pain - Right/Left: Right Pain - part of body: Hip     Time: 2633-3545 PT Time Calculation (min) (ACUTE ONLY): 30 min  Charges:  $Therapeutic Activity: 23-37 mins                    G Codes:       Roney Catelyn, PT  Acute Rehabilitation Services Pager [phone] Office [phone]    Kylie Sanchez 04/08/2018, 1:07 PM

## 2018-04-08 NOTE — Consult Note (Signed)
   [facility] CM Inpatient Consult   04/08/2018  Kylie Sanchez [birth year] [id number]    Patient screened for potential [facility] Care Management needs due to increased hospital readmission risk score of 27% (high).  Chart reviewed. Noted patient is from SNF.  Spoke with inpatient LCSW to confirm discharge plan likely to return to [facility].   No identifiable [facility] Care Management needs at this time   [name], MSN-Ed, RN,BSN [facility] - Closed Liaison [phone]

## 2018-04-08 NOTE — Progress Notes (Signed)
Progress Note   Subjective   She is feeling better today, improved SOB.  Inpatient Medications    Scheduled Meds: . amiodarone  200 mg Oral BID  . atorvastatin  40 mg Oral q1800  . escitalopram  5 mg Oral Daily  . mouth rinse  15 mL Mouth Rinse BID  . metoprolol tartrate  12.5 mg Oral BID  . polyethylene glycol  17 g Oral BID  . senna-docusate  1 tablet Oral BID   Continuous Infusions: . sodium chloride 75 mL/hr at 04/07/18 0505  . sodium chloride    . piperacillin-tazobactam (ZOSYN)  IV Stopped (04/08/18 0556)  . vancomycin Stopped (04/06/18 2331)   PRN Meds: acetaminophen **OR** acetaminophen, HYDROcodone-acetaminophen, ipratropium-albuterol, nitroGLYCERIN, ondansetron **OR** ondansetron (ZOFRAN) IV, sodium phosphate   Vital Signs    Vitals:   04/07/18 1601 04/07/18 2129 04/08/18 0423 04/08/18 0818  BP: 126/73 124/85 124/66 132/84  Pulse: (!) 111 95 79 86  Resp: 20 16 16 20   Temp: 98.4 F (36.9 C) 98.2 F (36.8 C)  97.6 F (36.4 C)  TempSrc: Oral   Oral  SpO2: 98% 100% 100% 100%  Weight:      Height:        Intake/Output Summary (Last 24 hours) at 04/08/2018 1033 Last data filed at 04/08/2018 1020 Gross per 24 hour  Intake 160 ml  Output 601 ml  Net -441 ml   Filed Weights   04/05/18 0048  Weight: 167 lb (75.8 kg)    Telemetry    Sinus has converted to SR just 10 minutes ago at 10:18 am - Personally Reviewed  Physical Exam   GEN- The patient is elderly appearing, alert and oriented x 3 today.   Head- normocephalic, atraumatic Eyes-  S/p L eye removal Ears- hearing intact Oropharynx- clear Neck- supple, Lungs- Clear to ausculation bilaterally, normal work of breathing Heart- RRR  GI- soft, NT, ND, + BS Extremities- no clubbing, cyanosis, or edema  MS- diffuse atrophy Skin- no rash or lesion Psych- euthymic mood, full affect Neuro- strength and sensation are intact   Labs    Chemistry Recent Labs  Lab 04/04/18 2124  04/06/18 0355  04/07/18 0542 04/08/18 0513  NA 133*   < > 133* 136 138  K 4.9   < > 4.4 4.3 3.8  CL 93*   < > 98* 100* 99*  CO2 26   < > 24 25 26   GLUCOSE 280*   < > 83 122* 84  BUN 44*   < > 45* 47* 46*  CREATININE 2.15*   < > 2.13* 2.31* 2.34*  CALCIUM 8.3*   < > 7.8* 8.1* 8.5*  PROT 6.0*  --   --   --   --   ALBUMIN 2.4*  --   --   --   --   AST 26  --   --   --   --   ALT 17  --   --   --   --   ALKPHOS 89  --   --   --   --   BILITOT 0.9  --   --   --   --   GFRNONAA 19*   < > 19* 17* 17*  GFRAA 22*   < > 22* 20* 20*  ANIONGAP 14   < > 11 11 13    < > = values in this interval not displayed.     Hematology Recent Labs  Lab 04/06/18 0355 04/06/18  1950 04/07/18 0542 04/08/18 0513  WBC 8.4  --  6.4 6.5  RBC 2.27* 3.36* 3.39* 3.37*  HGB 6.8* 10.1* 9.9* 10.1*  HCT 21.7* 30.8* 31.3* 32.0*  MCV 95.6  --  92.3 95.0  MCH 30.0  --  29.2 30.0  MCHC 31.3  --  31.6 31.6  RDW 17.6*  --  16.5* 16.5*  PLT 312  --  335 404*    Cardiac Enzymes Recent Labs  Lab 04/05/18 0218 04/05/18 0723 04/05/18 1304  TROPONINI 0.36* 0.31* 0.38*    Recent Labs  Lab 04/04/18 2155  TROPIPOC 0.26*       Patient Profile     84 y.o. 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   Assessment & Plan    1.  Paroxysmal atrial fibrillation In Sinus bradycardia - spontaneous cardioversion at 10: 18 am yesterady, in sinus brady today, I will decrsae amiodarone to 200 mg po daily. Continue low dose metoprolol 12.5 mg po BID Not currently a candidate for anticoagulation due to anemia  2. Elevated troponin I agree with Dr Domenic Polite that this is likely demand ischemia in the setting of acute medical illness/  anemia Not a candidate for anticoagulation or CV procedures  3. Pneumonia following recent hip fracture Per primary team Blood cultures negative  4. Acute anemia receiving PRBCs currently Coumadin is on hold Primary team evaluating cause of her anemia  Her long term prognosis is poor A conservative approach may be best  Ena Dawley MD, [facility] 04/08/2018 10:33 AM

## 2018-04-08 NOTE — Care Management Note (Addendum)
Case Management Note  Patient Details  Name: Zhara Gieske MRN: [id number] Date of Birth: [birth year]  Subjective/Objective:   Admitted on 04/04/2018 with concern for HCAP/sepsis. PCP noted.       Action/Plan: Prior to admission patient resided at SNF ([facility]). Once patient is medically stable for discharge and bed available plan to discharge to SNF, per CSW arrangements. NCM will continue to follow how patient progresses.   Expected Discharge Date:    To Be determined             Expected Discharge Plan:  [place]  In-House Referral:  Clinical Social Work  Discharge planning Services  CM Consult  Status of Service:  In process, will continue to follow  Kristen Cardinal, RN 04/08/2018, 12:06 PM

## 2018-04-09 ENCOUNTER — Telehealth (INDEPENDENT_AMBULATORY_CARE_PROVIDER_SITE_OTHER): Payer: Self-pay | Admitting: Orthopaedic Surgery

## 2018-04-09 LAB — BASIC METABOLIC PANEL
Anion gap: 13 (ref 5–15)
BUN: 42 mg/dL — AB (ref 6–20)
CO2: 18 mmol/L — ABNORMAL LOW (ref 22–32)
CREATININE: 2.15 mg/dL — AB (ref 0.44–1.00)
Calcium: 8.1 mg/dL — ABNORMAL LOW (ref 8.9–10.3)
Chloride: 106 mmol/L (ref 101–111)
GFR calc Af Amer: 22 mL/min — ABNORMAL LOW (ref >60)
GFR, EST NON AFRICAN AMERICAN: 19 mL/min — AB (ref >60)
Glucose, Bld: 125 mg/dL — ABNORMAL HIGH (ref 65–99)
POTASSIUM: 4.4 mmol/L (ref 3.5–5.1)
SODIUM: 137 mmol/L (ref 135–145)

## 2018-04-09 LAB — GLUCOSE, CAPILLARY
GLUCOSE-CAPILLARY: 117 mg/dL — AB (ref 65–99)
Glucose-Capillary: 121 mg/dL — ABNORMAL HIGH (ref 65–99)

## 2018-04-09 LAB — CULTURE, BLOOD (ROUTINE X 2)
CULTURE: NO GROWTH
SPECIAL REQUESTS: ADEQUATE

## 2018-04-09 MED ORDER — AMIODARONE LOAD VIA INFUSION
150.0000 mg | Freq: Once | INTRAVENOUS | Status: AC
  Administered 2018-04-09: 150 mg via INTRAVENOUS
  Filled 2018-04-09: qty 83.34

## 2018-04-09 MED ORDER — AMIODARONE HCL IN DEXTROSE 360-4.14 MG/200ML-% IV SOLN
60.0000 mg/h | INTRAVENOUS | Status: AC
  Administered 2018-04-09: 60 mg/h via INTRAVENOUS
  Filled 2018-04-09 (×3): qty 200

## 2018-04-09 MED ORDER — AMIODARONE HCL IN DEXTROSE 360-4.14 MG/200ML-% IV SOLN
30.0000 mg/h | INTRAVENOUS | Status: DC
  Administered 2018-04-10 – 2018-04-13 (×6): 30 mg/h via INTRAVENOUS
  Filled 2018-04-09 (×8): qty 200

## 2018-04-09 MED ORDER — AMOXICILLIN-POT CLAVULANATE 250-62.5 MG/5ML PO SUSR
500.0000 mg | Freq: Two times a day (BID) | ORAL | Status: DC
  Administered 2018-04-09 – 2018-04-13 (×8): 500 mg via ORAL
  Filled 2018-04-09 (×10): qty 10

## 2018-04-09 MED ORDER — AMOXICILLIN-POT CLAVULANATE 500-125 MG PO TABS: 500.0000 mg | ORAL_TABLET | Freq: Two times a day (BID) | ORAL | Status: DC

## 2018-04-09 NOTE — Progress Notes (Signed)
Pt arrived to 4E.  CHG bath given.  Noted generalized edema & +4 pitting R arm edema.  NS @75cc /hr stopped and MD notified.  Amnio drip started and monitoring vs.   Kylie Sanchez

## 2018-04-09 NOTE — Telephone Encounter (Signed)
After hip surgery with Dr. Ninfa Linden the patient came down with double pneumonia and would like to know if someone can come up to [facility] and take out her stiches so they wont have to have her transported. CB # [phone]

## 2018-04-09 NOTE — Clinical Social Work Note (Signed)
Patient transferred from 65M to 4E13. Receiving CSW contacted and message left regarding transfer.  Elber Galyean Givens, MSW, LCSW Licensed Clinical Social Worker [facility] [phone]

## 2018-04-09 NOTE — Progress Notes (Signed)
Pt transferred via bed, with O2, and telemetry with belongings, family at side, unit charge nurse and NT transferred pt to room 4E13.

## 2018-04-09 NOTE — Progress Notes (Addendum)
Pt has bed available on unit 4 East, for infusion of amiodarone infusion. Pt will transfer to room 4E13. Report called to [facility] on unit 4[facility]. Pt and family at bedside informed of new bed assignment and need for amiodarone infusion.   Family and pt informed RN that a doctor had been in and d/c'd pt's staples for her right leg.  Steri strips noted to all 3 incisions. Noted upper 2 incisions oozing serosanguinous drainage, this RN applied dry gauze to these 2 incisions.

## 2018-04-09 NOTE — Progress Notes (Signed)
Progress Note   Subjective   She seems upset this morning, she has constipation, complains of leg pain, she is back in a-fib.  Inpatient Medications    Scheduled Meds: . amiodarone  200 mg Oral BID  . atorvastatin  40 mg Oral q1800  . escitalopram  5 mg Oral Daily  . mouth rinse  15 mL Mouth Rinse BID  . metoprolol tartrate  12.5 mg Oral BID  . polyethylene glycol  17 g Oral BID  . senna-docusate  1 tablet Oral BID   Continuous Infusions: . sodium chloride 100 mL/hr at 04/08/18 2144  . sodium chloride    . piperacillin-tazobactam (ZOSYN)  IV Stopped (04/09/18 6599)   PRN Meds: acetaminophen **OR** acetaminophen, HYDROcodone-acetaminophen, ipratropium-albuterol, nitroGLYCERIN, ondansetron **OR** ondansetron (ZOFRAN) IV, sodium phosphate   Vital Signs    Vitals:   04/08/18 0818 04/08/18 1718 04/08/18 2141 04/09/18 0429  BP: 132/84 130/68 133/65 129/82  Pulse: 86 96 88 78  Resp: 20 18 18 20   Temp: 97.6 F (36.4 C) 97.6 F (36.4 C) 98.4 F (36.9 C) 98 F (36.7 C)  TempSrc: Oral Oral Oral Oral  SpO2: 100% 100% 100% 94%  Weight:   193 lb (87.5 kg)   Height:        Intake/Output Summary (Last 24 hours) at 04/09/2018 0924 Last data filed at 04/09/2018 3570 Gross per 24 hour  Intake 4404.58 ml  Output 1251 ml  Net 3153.58 ml   Filed Weights   04/05/18 0048 04/08/18 2141  Weight: 167 lb (75.8 kg) 193 lb (87.5 kg)    Telemetry    Back in a-fib since 4 pm on 5/29 - Personally Reviewed  Physical Exam   GEN- The patient is elderly appearing, alert and oriented x 3 today.   Head- normocephalic, atraumatic Eyes-  S/p L eye removal Ears- hearing intact Oropharynx- clear Neck- supple, Lungs- Clear to ausculation bilaterally, normal work of breathing Heart- RRR  GI- soft, NT, ND, + BS Extremities- no clubbing, cyanosis, or edema  MS- diffuse atrophy Skin- no rash or lesion Psych- euthymic mood, full affect Neuro- strength and sensation are intact   Labs    Chemistry Recent Labs  Lab 04/04/18 2124  04/07/18 0542 04/08/18 0513 04/09/18 0658  NA 133*   < > 136 138 137  K 4.9   < > 4.3 3.8 4.4  CL 93*   < > 100* 99* 106  CO2 26   < > 25 26 18*  GLUCOSE 280*   < > 122* 84 125*  BUN 44*   < > 47* 46* 42*  CREATININE 2.15*   < > 2.31* 2.34* 2.15*  CALCIUM 8.3*   < > 8.1* 8.5* 8.1*  PROT 6.0*  --   --   --   --   ALBUMIN 2.4*  --   --   --   --   AST 26  --   --   --   --   ALT 17  --   --   --   --   ALKPHOS 89  --   --   --   --   BILITOT 0.9  --   --   --   --   GFRNONAA 19*   < > 17* 17* 19*  GFRAA 22*   < > 20* 20* 22*  ANIONGAP 14   < > 11 13 13    < > = values in this interval not displayed.  Hematology Recent Labs  Lab 04/06/18 0355 04/06/18 1950 04/07/18 0542 04/08/18 0513  WBC 8.4  --  6.4 6.5  RBC 2.27* 3.36* 3.39* 3.37*  HGB 6.8* 10.1* 9.9* 10.1*  HCT 21.7* 30.8* 31.3* 32.0*  MCV 95.6  --  92.3 95.0  MCH 30.0  --  29.2 30.0  MCHC 31.3  --  31.6 31.6  RDW 17.6*  --  16.5* 16.5*  PLT 312  --  335 404*    Cardiac Enzymes Recent Labs  Lab 04/05/18 0218 04/05/18 0723 04/05/18 1304  TROPONINI 0.36* 0.31* 0.38*    Recent Labs  Lab 04/04/18 2155  TROPIPOC 0.26*       Patient Profile     84 y.o. 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   Assessment & Plan    1.  Paroxysmal atrial fibrillation Back in a-fib after missing one dose of amiodarone yesterday, amio not given sec to bradycardia, I would restart iv amiodarone again, if it fails this time I would focus on rate control only, she seems to feel much better in SR.  Not currently a candidate for anticoagulation due to anemia  2. Elevated  troponin I agree with Dr Domenic Polite that this is likely demand ischemia in the setting of acute medical illness/ anemia Not a candidate for anticoagulation or CV procedures  3. Pneumonia following recent hip fracture Per primary team Blood cultures negative  4. Acute anemia receiving PRBCs currently Coumadin is on hold Primary team evaluating cause of her anemia  Her long term prognosis is poor. A conservative approach may be best  Ena Dawley MD, [facility] 04/09/2018 9:24 AM

## 2018-04-09 NOTE — Progress Notes (Signed)
Dr Maryland Pink made aware of Dr Francesca Oman (cardiology) order for pt to be on IV amiodarone infusion, (he was informed by unit pharmacist at progression meeting )  Pt will have to be transferred to a telemetry unit that can administer and monitor an amiodarone infusion.  Dr Maryland Pink placed the order. Bed request made. Will inform pt and family.

## 2018-04-09 NOTE — Telephone Encounter (Signed)
See below

## 2018-04-09 NOTE — Progress Notes (Signed)
PT Cancellation Note  Patient Details Name: Kylie Sanchez MRN: [id number] DOB: [birth year]   Cancelled Treatment:    Reason Eval/Treat Not Completed: Other (comment). Patient declining therapy services secondary to pain. Will try to coordinate with pain medication for next visit.  Ellamae Sia, PT, DPT Acute Rehabilitation Services  Pager: [phone]    Willy Eddy 04/09/2018, 3:03 PM

## 2018-04-09 NOTE — Progress Notes (Signed)
[facility] PROGRESS NOTE  Karyn Brull IHK:[id number] DOB: 08/13/1925 DOA: 04/04/2018  PCP: Unk Pinto, MD  Brief History/Interval Summary: 84 year old female with history of CKD stage III, hypertension, hyperlipidemia, paroxysmal atrial fib, diabetes, recent right hip fracture status post repair, was discharged to skilled nursing facility.  Patient was sent from the skilled nursing facility with a fever of 103 F, lethargy, altered mental status.  Chest x-ray showed patchy infiltrates in both lung bases.  Patient was hospitalized for further management.  Reason for Visit: Sepsis  Consultants: Cardiology  Procedures:  Transthoracic echocardiogram Study Conclusions  - Left ventricle: The cavity size was normal. There was mild   concentric hypertrophy. Systolic function was mildly reduced. The   estimated ejection fraction was in the range of 45% to 50%.   Hypokinesis of the anteroseptal, inferior, inferoseptal, and   apical myocardium. Doppler parameters are consistent with   abnormal left ventricular relaxation (grade 1 diastolic   dysfunction). - Aortic valve: Transvalvular velocity was within the normal range.   There was no stenosis. There was no regurgitation. - Mitral valve: Transvalvular velocity was within the normal range.   There was no evidence for stenosis. There was trivial   regurgitation. - Left atrium: The atrium was mildly dilated. - Right ventricle: The cavity size was normal. Wall thickness was   normal. Systolic function was normal. - Atrial septum: No defect or patent foramen ovale was identified   by color flow Doppler. - Tricuspid valve: There was trivial regurgitation. - Pulmonary arteries: Systolic pressure was within the normal   range. PA peak pressure: 28 mm Hg (S). - Pericardium, extracardiac: A small pericardial effusion was   identified posterior to the heart. Features were not consistent   with tamponade  physiology.  Impressions:  - Compared with the echo 06/2017, wall motion abnormalities are new.   Antibiotics: None  Subjective/Interval History: Patient states that she feels more short of breath today.  Denies any nausea vomiting.  Denies any chest pain.    ROS: Denies any headaches.  Objective:  Vital Signs  Vitals:   04/08/18 0818 04/08/18 1718 04/08/18 2141 04/09/18 0429  BP: 132/84 130/68 133/65 129/82  Pulse: 86 96 88 78  Resp: 20 18 18 20   Temp: 97.6 F (36.4 C) 97.6 F (36.4 C) 98.4 F (36.9 C) 98 F (36.7 C)  TempSrc: Oral Oral Oral Oral  SpO2: 100% 100% 100% 94%  Weight:   87.5 kg (193 lb)   Height:        Intake/Output Summary (Last 24 hours) at 04/09/2018 0956 Last data filed at 04/09/2018 7564 Gross per 24 hour  Intake 4404.58 ml  Output 1251 ml  Net 3153.58 ml   Filed Weights   04/05/18 0048 04/08/18 2141  Weight: 75.8 kg (167 lb) 87.5 kg (193 lb)    General appearance: Awake alert.  In no distress. Resp: Clear to auscultation bilaterally.  Somewhat diminished air entry at the bases.  No definite wheezing or rhonchi. Cardio: S1-S2 is irregularly irregular  GI: Abdomen is soft.  Nontender nondistended.  Bowel sounds are present.  No masses or organomegaly. Extremities: No edema Neurologic: No focal neurological deficits.  Lab Results:  Data Reviewed: I have personally reviewed following labs and imaging studies  CBC: Recent Labs  Lab 04/04/18 2124 04/05/18 0723 04/06/18 0355 04/06/18 1950 04/07/18 0542 04/08/18 0513  WBC 13.0* 11.0* 8.4  --  6.4 6.5  NEUTROABS 12.0*  --   --   --   --   --  HGB 7.7* 7.7* 6.8* 10.1* 9.9* 10.1*  HCT 25.3* 25.5* 21.7* 30.8* 31.3* 32.0*  MCV 96.6 97.7 95.6  --  92.3 95.0  PLT 332 249 312  --  335 404*    Basic Metabolic Panel: Recent Labs  Lab 04/05/18 0218 04/05/18 0723 04/06/18 0355 04/07/18 0542 04/08/18 0513 04/09/18 0658  NA  --  134* 133* 136 138 137  K  --  4.7 4.4 4.3 3.8 4.4  CL   --  96* 98* 100* 99* 106  CO2  --  26 24 25 26  18*  GLUCOSE  --  160* 83 122* 84 125*  BUN  --  45* 45* 47* 46* 42*  CREATININE  --  2.14* 2.13* 2.31* 2.34* 2.15*  CALCIUM  --  7.9* 7.8* 8.1* 8.5* 8.1*  MG 1.5*  --   --   --   --   --   PHOS 4.2  --   --   --   --   --     GFR: Estimated Creatinine Clearance: 17.9 mL/min (A) (by C-G formula based on SCr of 2.15 mg/dL (H)).  Liver Function Tests: Recent Labs  Lab 04/04/18 2124  AST 26  ALT 17  ALKPHOS 89  BILITOT 0.9  PROT 6.0*  ALBUMIN 2.4*     Recent Labs  Lab 04/05/18 1304  AMMONIA 18    Coagulation Profile: Recent Labs  Lab 04/05/18 0548 04/05/18 0723 04/06/18 1950 04/08/18 0513  INR 4.01* 3.59 2.99 3.18    Cardiac Enzymes: Recent Labs  Lab 04/05/18 0218 04/05/18 0723 04/05/18 1304 04/08/18 0513  CKTOTAL  --   --   --  53  TROPONINI 0.36* 0.31* 0.38*  --     CBG: Recent Labs  Lab 04/08/18 1035 04/08/18 1217 04/08/18 1701 04/08/18 2146 04/09/18 0735  GLUCAP 109* 118* 146* 153* 117*   Anemia Panel: Recent Labs    04/06/18 1949 04/06/18 1950  VITAMINB12  --  470  FOLATE 35.0  --   FERRITIN  --  221  TIBC  --  197*  IRON  --  38  RETICCTPCT  --  2.1    Recent Results (from the past 240 hour(s))  Urine culture     Status: Abnormal   Collection Time: 04/04/18  9:07 PM  Result Value Ref Range Status   Specimen Description URINE, RANDOM  Final   Special Requests   Final    NONE Performed at [facility], [place] [address], [place], [place] [address]    Culture 40,000 COLONIES/mL YEAST (A)  Final   Report Status 04/06/2018 FINAL  Final  Blood Culture (routine x 2)     Status: None (Preliminary result)   Collection Time: 04/04/18  9:27 PM  Result Value Ref Range Status   Specimen Description BLOOD LEFT ARM  Final   Special Requests   Final    BOTTLES DRAWN AEROBIC AND ANAEROBIC Blood Culture adequate volume   Culture   Final    NO GROWTH 4 DAYS Performed at [facility], [place] [address], [place], [place] [address]    Report Status PENDING  Incomplete  Blood Culture (routine x 2)     Status: None (Preliminary result)   Collection Time: 04/05/18  2:10 AM  Result Value Ref Range Status   Specimen Description BLOOD RIGHT ARM  Final   Special Requests   Final    BOTTLES DRAWN AEROBIC ONLY Blood Culture results may not be  optimal due to an inadequate volume of blood received in culture bottles   Culture   Final    NO GROWTH 3 DAYS Performed at [facility], [place] [address] [place], [place] [address]    Report Status PENDING  Incomplete      Radiology Studies: Ct Abdomen Pelvis Wo Contrast  Result Date: 04/07/2018 CLINICAL DATA:  Abdominal distension EXAM: CT ABDOMEN AND PELVIS WITHOUT CONTRAST TECHNIQUE: Multidetector CT imaging of the abdomen and pelvis was performed following the standard protocol without IV contrast. COMPARISON:  None. FINDINGS: Lower chest: Bilateral lower lobe consolidation is noted with associated small pleural effusions this is slightly more progressed on the left than the right. Hepatobiliary: Gallbladder is well distended without definitive cholelithiasis. The liver is unremarkable. Pancreas: Unremarkable. No pancreatic ductal dilatation or surrounding inflammatory changes. Spleen: Normal in size without focal abnormality. Adrenals/Urinary Tract: The adrenal glands are within normal limits bilaterally. Scattered lesions are noted within both kidneys likely related to cysts. A few of these are hyperdense in nature likely related to hemorrhage. No obstructive changes are seen. Foley catheter is noted in the bladder. Small amount of air is noted in the bladder likely related to recent instrumentation. Stomach/Bowel: Diverticulosis of the colon is seen without definitive diverticulitis. No obstructive changes are seen. The appendix is within normal limits. Large duodenal diverticulum is noted adjacent to the head of the pancreas.  Vascular/Lymphatic: Aortic atherosclerosis. No enlarged abdominal or pelvic lymph nodes. Reproductive: Uterus and bilateral adnexa are unremarkable. Other: Mild ascites is noted as well as changes of anasarca within the abdominal wall. Musculoskeletal: Degenerative changes of the lumbar spine are seen. Changes of prior femoral fracture on the right are noted with fixation. There is also a fracture of the inferior pubic ramus on the right IMPRESSION: Bilateral lower lobe consolidation with associated effusions worse on the left than the right. Cystic changes within the kidneys. Mild ascites and changes of anasarca. Right femoral fracture with fixation. Inferior pubic ramus fracture is noted as well. Diverticulosis without diverticulitis. Electronically Signed   By: Inez Catalina M.D.   On: 04/07/2018 14:00     Medications:  Scheduled: . amiodarone  150 mg Intravenous Once  . amoxicillin-clavulanate  500 mg Oral BID  . atorvastatin  40 mg Oral q1800  . escitalopram  5 mg Oral Daily  . mouth rinse  15 mL Mouth Rinse BID  . metoprolol tartrate  12.5 mg Oral BID  . polyethylene glycol  17 g Oral BID  . senna-docusate  1 tablet Oral BID   Continuous: . sodium chloride 100 mL/hr at 04/08/18 2144  . amiodarone     Followed by  . amiodarone     OMB:[id number] **OR** acetaminophen, HYDROcodone-acetaminophen, ipratropium-albuterol, nitroGLYCERIN, ondansetron **OR** ondansetron (ZOFRAN) IV, sodium phosphate  Assessment/Plan:    Healthcare associated pneumonia with sepsis Patient met sepsis criteria at the time of admission with presence of fever, leukocytosis, lactic acidosis, acute kidney injury.  Patient placed on intravenous vancomycin and Zosyn.  Blood cultures have been negative.  Seen by speech therapy.  On dysphagia 1 diet.  Urine strep antigen and urine Legionella are negative.  Vancomycin was discontinued.  Zosyn can be changed over to Augmentin today.    Atrial fibrillation Patient has  been in and out of atrial fibrillation.  Cardiology is following.  Placed on amiodarone.  Also placed on beta-blocker.  Not anticoagulation candidate per cards.  Patient had converted to sinus rhythm but converted back to atrial fibrillation  overnight.  Started back on amiodarone infusion by cardiology.  Elevated troponin and abnormal EKG Thought to be demand ischemia due to sepsis.  Cardiology following.  Echocardiogram showed diminished systolic function with wall motion abnormalities.  Could consider an antiplatelet agent.  Patient is on a beta-blocker.  On statin.  Acute on chronic kidney disease stage 3 Baseline creatinine appears to be around 0.3-1.5.  Creatinine noted to be rising.  Patient was given IV fluids.  Creatinine is slightly better today.  Urine output has picked up.  Cut back on fluids.  Could be due to hypovolemia, could be secondary to hemodynamic instability from A. fib, antibiotics.  UA still pending.  Strict ins and outs.  No hydronephrosis noted on CT scan.  UA from 5/25 showed moderate hemoglobin with 0 RBCs.  CK normal.  Acute on chronic anemia, normocytic Baseline hemoglobin 7.9-9.5.  Patient's hemoglobin dropped to 6.8 on 5/27.  She was transfused 2 units of PRBC.  No evidence of overt bleeding.  No concerning findings noted on CT abdomen pelvis.  Ferritin 221.  B12 470.  Folate 35.  Hemoglobin remains stable.  Diabetes mellitus type 2 Stable.  Continue to monitor CBGs.  History of closed fracture of the right hip Status post recent surgery.  PT evaluation.  Acute metabolic encephalopathy Likely due to medical illness.  CT head was unremarkable.  Seems to be close to baseline now.  History of enucleation of the left eye Stable.  Chronic systolic CHF with diastolic dysfunction EF noted to be 45%.  Appears to be stable.  Constipation Improved with laxatives.  DVT Prophylaxis: SCDs    Code Status: DNR Family Communication: No family at bedside Disposition  Plan: Management as outlined above.    LOS: 5 days   [facility] Pager [phone] 04/09/2018, 9:56 AM  If 7PM-7AM, please contact night-coverage at [email or web address], password [facility]

## 2018-04-10 DIAGNOSIS — I5041 Acute combined systolic (congestive) and diastolic (congestive) heart failure: Secondary | ICD-10-CM

## 2018-04-10 DIAGNOSIS — I4891 Unspecified atrial fibrillation: Secondary | ICD-10-CM

## 2018-04-10 LAB — BASIC METABOLIC PANEL
ANION GAP: 13 (ref 5–15)
BUN: 37 mg/dL — AB (ref 6–20)
CALCIUM: 8.4 mg/dL — AB (ref 8.9–10.3)
CO2: 17 mmol/L — ABNORMAL LOW (ref 22–32)
Chloride: 109 mmol/L (ref 101–111)
Creatinine, Ser: 2.05 mg/dL — ABNORMAL HIGH (ref 0.44–1.00)
GFR calc Af Amer: 23 mL/min — ABNORMAL LOW (ref >60)
GFR, EST NON AFRICAN AMERICAN: 20 mL/min — AB (ref >60)
Glucose, Bld: 106 mg/dL — ABNORMAL HIGH (ref 65–99)
POTASSIUM: 4.3 mmol/L (ref 3.5–5.1)
SODIUM: 139 mmol/L (ref 135–145)

## 2018-04-10 LAB — CULTURE, BLOOD (ROUTINE X 2): Culture: NO GROWTH

## 2018-04-10 LAB — GLUCOSE, CAPILLARY
GLUCOSE-CAPILLARY: 141 mg/dL — AB (ref 65–99)
GLUCOSE-CAPILLARY: 137 mg/dL — AB (ref 65–99)
Glucose-Capillary: 117 mg/dL — ABNORMAL HIGH (ref 65–99)
Glucose-Capillary: 152 mg/dL — ABNORMAL HIGH (ref 65–99)

## 2018-04-10 LAB — MAGNESIUM: MAGNESIUM: 1.7 mg/dL (ref 1.7–2.4)

## 2018-04-10 MED ORDER — FUROSEMIDE 10 MG/ML IJ SOLN
40.0000 mg | Freq: Three times a day (TID) | INTRAMUSCULAR | Status: DC
  Administered 2018-04-10 – 2018-04-15 (×16): 40 mg via INTRAVENOUS
  Filled 2018-04-10 (×16): qty 4

## 2018-04-10 MED ORDER — ASPIRIN EC 81 MG PO TBEC
81.0000 mg | DELAYED_RELEASE_TABLET | Freq: Every day | ORAL | Status: DC
  Administered 2018-04-10 – 2018-04-13 (×4): 81 mg via ORAL
  Filled 2018-04-10 (×4): qty 1

## 2018-04-10 NOTE — Progress Notes (Signed)
Progress Note   Subjective   The patient seems unhappy, has some SOB.  Inpatient Medications    Scheduled Meds: . amoxicillin-clavulanate  500 mg Oral Q12H  . atorvastatin  40 mg Oral q1800  . escitalopram  5 mg Oral Daily  . mouth rinse  15 mL Mouth Rinse BID  . metoprolol tartrate  12.5 mg Oral BID  . polyethylene glycol  17 g Oral BID  . senna-docusate  1 tablet Oral BID   Continuous Infusions: . sodium chloride Stopped (04/09/18 1430)  . amiodarone     PRN Meds: acetaminophen **OR** acetaminophen, HYDROcodone-acetaminophen, ipratropium-albuterol, nitroGLYCERIN, ondansetron **OR** ondansetron (ZOFRAN) IV, sodium phosphate   Vital Signs    Vitals:   04/09/18 2000 04/09/18 2100 04/10/18 0500 04/10/18 0541  BP: 117/76 (!) 120/57 125/74   Pulse:   77 81  Resp: 18 15 17 18   Temp:    98.1 F (36.7 C)  TempSrc:    Oral  SpO2:   94% 90%  Weight:    188 lb (85.3 kg)  Height:        Intake/Output Summary (Last 24 hours) at 04/10/2018 0840 Last data filed at 04/10/2018 0651 Gross per 24 hour  Intake 423.7 ml  Output 775 ml  Net -351.3 ml   Filed Weights   04/05/18 0048 04/08/18 2141 04/10/18 0541  Weight: 167 lb (75.8 kg) 193 lb (87.5 kg) 188 lb (85.3 kg)    Telemetry    Back in a-fib since 4 pm on 5/29 - Personally Reviewed  Physical Exam   GEN- The patient is elderly appearing, alert and oriented x 3 today.   Head- normocephalic, atraumatic Eyes-  S/p L eye removal Ears- hearing intact Oropharynx- clear Neck- supple, Lungs- Crackles B/L Heart- RRR  GI- soft, NT, ND, + BS Extremities- no clubbing, cyanosis, edema in upper and lower extremities MS- diffuse atrophy Skin- no rash or lesion Psych- euthymic mood, full affect Neuro- strength and sensation are intact   Labs    Chemistry Recent Labs  Lab 04/04/18 2124  04/07/18 0542 04/08/18 0513 04/09/18 0658  NA 133*   < > 136 138 137  K 4.9   < > 4.3 3.8 4.4  CL 93*   < > 100* 99* 106  CO2  26   < > 25 26 18*  GLUCOSE 280*   < > 122* 84 125*  BUN 44*   < > 47* 46* 42*  CREATININE 2.15*   < > 2.31* 2.34* 2.15*  CALCIUM 8.3*   < > 8.1* 8.5* 8.1*  PROT 6.0*  --   --   --   --   ALBUMIN 2.4*  --   --   --   --   AST 26  --   --   --   --   ALT 17  --   --   --   --   ALKPHOS 89  --   --   --   --   BILITOT 0.9  --   --   --   --   GFRNONAA 19*   < > 17* 17* 19*  GFRAA 22*   < > 20* 20* 22*  ANIONGAP 14   < > 11 13 13    < > = values in this interval not displayed.     Hematology Recent Labs  Lab 04/06/18 0355 04/06/18 1950 04/07/18 0542 04/08/18 0513  WBC 8.4  --  6.4 6.5  RBC 2.27*  3.36* 3.39* 3.37*  HGB 6.8* 10.1* 9.9* 10.1*  HCT 21.7* 30.8* 31.3* 32.0*  MCV 95.6  --  92.3 95.0  MCH 30.0  --  29.2 30.0  MCHC 31.3  --  31.6 31.6  RDW 17.6*  --  16.5* 16.5*  PLT 312  --  335 404*    Cardiac Enzymes Recent Labs  Lab 04/05/18 0218 04/05/18 0723 04/05/18 1304  TROPONINI 0.36* 0.31* 0.38*    Recent Labs  Lab 04/04/18 2155  TROPIPOC 0.26*       Patient Profile     84 y.o. 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   Assessment & Plan    1.  Paroxysmal atrial fibrillation Back in a-fib after missing one dose of amiodarone yesterday, amio iv started yesterday, she is in and out of a-fib, I would continue iv load longer this time. If it fails this time I would focus on rate control only, she seems to feel much better in SR as in a-fib she tends to be in persistent RVR. Not currently a candidate for anticoagulation due to anemia  2. Acute combined systolic and diastolic CHF  - weight up 1 lbs - I would start lasix 40 mg iv Q8H, follow crea  closely  Elevated troponin I agree with Dr Domenic Polite that this is likely demand ischemia in the setting of acute medical illness/ anemia Not a candidate for anticoagulation or CV procedures  3. Pneumonia following recent hip fracture Per primary team Blood cultures negative  4. Acute anemia receiving PRBCs currently Coumadin is on hold Primary team evaluating cause of her anemia  Her long term prognosis is poor. A conservative approach may be best  Ena Dawley MD, [facility] 04/10/2018 8:40 AM

## 2018-04-10 NOTE — Progress Notes (Addendum)
Physical Therapy Treatment Patient Details Name: Kylie Sanchez MRN: [id number] DOB: January 10, 1925 Today's Date: 04/10/2018    History of Present Illness Kylie Sanchez is a 84 y.o. female with a known history of CKD3, HTN, HLD, PAF, DM2, recent right hip fracture s/p surgical repair (WBAT) presents to the emergency department for evaluation of AMS.  Patient was in a usual state of health until this past week when she has been lethargic.  Baseline is fully alert and oriented per caregiver report to EDP; workup revealing pneumonia, mildly elevated troponins, acute kidney injury    PT Comments    Pt tolerated transfer to chair using the maxisky.    Follow Up Recommendations  SNF;Supervision/Assistance - 24 hour     Equipment Recommendations  None recommended by PT    Recommendations for Other Services       Precautions / Restrictions Precautions Precautions: Fall Precaution Comments: no L eye Restrictions Weight Bearing Restrictions: Yes RLE Weight Bearing: Weight bearing as tolerated    Mobility  Bed Mobility  Transfers Overall transfer level: Needs assistance Equipment used: Ambulation equipment used           General transfer comment: Maxisky from bed to recliner  Ambulation/Gait                 Stairs             Wheelchair Mobility    Modified Rankin (Stroke Patients Only)       Balance                              Cognition Arousal/Alertness: Awake/alert Behavior During Therapy: WFL for tasks assessed/performed Overall Cognitive Status: Impaired/Different from baseline Area of Impairment: Following commands;Safety/judgement;Problem solving                       Following Commands: Follows one step commands inconsistently;Follows one step commands with increased time     Problem Solving: Slow processing;Difficulty sequencing;Requires verbal cues General Comments: Asked if we knew her father      Exercises General  Exercises - Lower Extremity Ankle Circles/Pumps: AROM;Right;Seated;10 reps Quad Sets: AROM;5 reps;Seated    General Comments        Pertinent Vitals/Pain Pain Assessment: Faces Faces Pain Scale: Hurts worst Pain Location: R hip with movement Pain Descriptors / Indicators: Operative site guarding;Aching Pain Intervention(s): Limited activity within patient's tolerance;Monitored during session;Premedicated before session;Repositioned    Home Living                      Prior Function            PT Goals (current goals can now be found in the care plan section) Progress towards PT goals: Progressing toward goals    Frequency    Min 2X/week      PT Plan Current plan remains appropriate    Co-evaluation              AM-PAC PT "6 Clicks" Daily Activity  Outcome Measure  Difficulty turning over in bed (including adjusting bedclothes, sheets and blankets)?: Unable Difficulty moving from lying on back to sitting on the side of the bed? : Unable Difficulty sitting down on and standing up from a chair with arms (e.g., wheelchair, bedside commode, etc,.)?: Unable Help needed moving to and from a bed to chair (including a wheelchair)?: Total Help needed walking in hospital room?: Total Help needed  climbing 3-5 steps with a railing? : Total 6 Click Score: 6    End of Session Equipment Utilized During Treatment: Gait belt Activity Tolerance: Patient limited by pain;Patient limited by fatigue Patient left: with call bell/phone within reach;with family/visitor present;in chair Nurse Communication: Mobility status;Need for lift equipment PT Visit Diagnosis: Muscle weakness (generalized) (M62.81);Pain;Other abnormalities of gait and mobility (R26.89) Pain - Right/Left: Right Pain - part of body: Hip     Time: 4179-1995 PT Time Calculation (min) (ACUTE ONLY): 14 min  Charges:  $Therapeutic Activity: 8-22 mins                    G Codes:       [facility]  PT [place] 04/10/2018, 12:12 PM

## 2018-04-10 NOTE — Progress Notes (Signed)
[facility] PROGRESS NOTE  Kylie Sanchez WLN:[id number] DOB: 31-Aug-1925 DOA: 04/04/2018  PCP: Unk Pinto, MD  Brief History/Interval Summary: 84 year old female with history of CKD stage III, hypertension, hyperlipidemia, paroxysmal atrial fib, diabetes, recent right hip fracture status post repair, was discharged to skilled nursing facility.  Patient was sent from the skilled nursing facility with a fever of 103 F, lethargy, altered mental status.  Chest x-ray showed patchy infiltrates in both lung bases.  Patient was hospitalized for further management.  Reason for Visit: Sepsis  Consultants: Cardiology  Procedures:  Transthoracic echocardiogram Study Conclusions  - Left ventricle: The cavity size was normal. There was mild   concentric hypertrophy. Systolic function was mildly reduced. The   estimated ejection fraction was in the range of 45% to 50%.   Hypokinesis of the anteroseptal, inferior, inferoseptal, and   apical myocardium. Doppler parameters are consistent with   abnormal left ventricular relaxation (grade 1 diastolic   dysfunction). - Aortic valve: Transvalvular velocity was within the normal range.   There was no stenosis. There was no regurgitation. - Mitral valve: Transvalvular velocity was within the normal range.   There was no evidence for stenosis. There was trivial   regurgitation. - Left atrium: The atrium was mildly dilated. - Right ventricle: The cavity size was normal. Wall thickness was   normal. Systolic function was normal. - Atrial septum: No defect or patent foramen ovale was identified   by color flow Doppler. - Tricuspid valve: There was trivial regurgitation. - Pulmonary arteries: Systolic pressure was within the normal   range. PA peak pressure: 28 mm Hg (S). - Pericardium, extracardiac: A small pericardial effusion was   identified posterior to the heart. Features were not consistent   with tamponade  physiology.  Impressions:  - Compared with the echo 06/2017, wall motion abnormalities are new.   Antibiotics: None  Subjective/Interval History: Patient not very communicative.  Denies any chest pain or shortness of breath.  Her caregiver is at the bedside.  ROS: Denies any headaches.  Objective:  Vital Signs  Vitals:   04/09/18 2100 04/10/18 0500 04/10/18 0541 04/10/18 0852  BP: (!) 120/57 125/74  135/68  Pulse:  77 81 90  Resp: 15 17 18    Temp:   98.1 F (36.7 C)   TempSrc:   Oral   SpO2:  94% 90%   Weight:   85.3 kg (188 lb)   Height:        Intake/Output Summary (Last 24 hours) at 04/10/2018 1144 Last data filed at 04/10/2018 0651 Gross per 24 hour  Intake 183.7 ml  Output 475 ml  Net -291.3 ml   Filed Weights   04/05/18 0048 04/08/18 2141 04/10/18 0541  Weight: 75.8 kg (167 lb) 87.5 kg (193 lb) 85.3 kg (188 lb)    General appearance: Awake alert.  In no distress. Resp: Clear to auscultation bilaterally.  Somewhat diminished air entry at the bases.  No obvious wheezing rales or rhonchi. Cardio: S1-S2 is irregularly irregular.  No S3-S4.  No rubs murmurs or bruit. GI: Abdomen remains soft.  Nontender nondistended.  Bowel sounds are present.  No masses organomegaly. Extremities: Edema is noted in the lower extremities. Neurologic: No focal neurological deficits.  Lab Results:  Data Reviewed: I have personally reviewed following labs and imaging studies  CBC: Recent Labs  Lab 04/04/18 2124 04/05/18 0723 04/06/18 0355 04/06/18 1950 04/07/18 0542 04/08/18 0513  WBC 13.0* 11.0* 8.4  --  6.4 6.5  NEUTROABS 12.0*  --   --   --   --   --  HGB 7.7* 7.7* 6.8* 10.1* 9.9* 10.1*  HCT 25.3* 25.5* 21.7* 30.8* 31.3* 32.0*  MCV 96.6 97.7 95.6  --  92.3 95.0  PLT 332 249 312  --  335 404*    Basic Metabolic Panel: Recent Labs  Lab 04/05/18 0218  04/06/18 0355 04/07/18 0542 04/08/18 0513 04/09/18 0658 04/10/18 0812  NA  --    < > 133* 136 138 137 139   K  --    < > 4.4 4.3 3.8 4.4 4.3  CL  --    < > 98* 100* 99* 106 109  CO2  --    < > 24 25 26  18* 17*  GLUCOSE  --    < > 83 122* 84 125* 106*  BUN  --    < > 45* 47* 46* 42* 37*  CREATININE  --    < > 2.13* 2.31* 2.34* 2.15* 2.05*  CALCIUM  --    < > 7.8* 8.1* 8.5* 8.1* 8.4*  MG 1.5*  --   --   --   --   --  1.7  PHOS 4.2  --   --   --   --   --   --    < > = values in this interval not displayed.    GFR: Estimated Creatinine Clearance: 18.5 mL/min (A) (by C-G formula based on SCr of 2.05 mg/dL (H)).  Liver Function Tests: Recent Labs  Lab 04/04/18 2124  AST 26  ALT 17  ALKPHOS 89  BILITOT 0.9  PROT 6.0*  ALBUMIN 2.4*     Recent Labs  Lab 04/05/18 1304  AMMONIA 18    Coagulation Profile: Recent Labs  Lab 04/05/18 0548 04/05/18 0723 04/06/18 1950 04/08/18 0513  INR 4.01* 3.59 2.99 3.18    Cardiac Enzymes: Recent Labs  Lab 04/05/18 0218 04/05/18 0723 04/05/18 1304 04/08/18 0513  CKTOTAL  --   --   --  53  TROPONINI 0.36* 0.31* 0.38*  --     CBG: Recent Labs  Lab 04/08/18 1701 04/08/18 2146 04/09/18 0735 04/09/18 2144 04/10/18 0623  GLUCAP 146* 153* 117* 121* 117*     Recent Results (from the past 240 hour(s))  Urine culture     Status: Abnormal   Collection Time: 04/04/18  9:07 PM  Result Value Ref Range Status   Specimen Description URINE, RANDOM  Final   Special Requests   Final    NONE Performed at [facility], [place] [address], [place], [place] [address]    Culture 40,000 COLONIES/mL YEAST (A)  Final   Report Status 04/06/2018 FINAL  Final  Blood Culture (routine x 2)     Status: None   Collection Time: 04/04/18  9:27 PM  Result Value Ref Range Status   Specimen Description BLOOD LEFT ARM  Final   Special Requests   Final    BOTTLES DRAWN AEROBIC AND ANAEROBIC Blood Culture adequate volume   Culture   Final    NO GROWTH 5 DAYS Performed at [facility], [place] [address], [place], [place] [address]    Report Status  04/09/2018 FINAL  Final  Blood Culture (routine x 2)     Status: None   Collection Time: 04/05/18  2:10 AM  Result Value Ref Range Status   Specimen Description BLOOD RIGHT ARM  Final   Special Requests   Final    BOTTLES DRAWN AEROBIC ONLY Blood Culture results may not be optimal due to  an inadequate volume of blood received in culture bottles   Culture   Final    NO GROWTH 5 DAYS Performed at [facility], [place] [address], [place], [place] [address]    Report Status 04/10/2018 FINAL  Final      Radiology Studies: No results found.   Medications:  Scheduled: . amoxicillin-clavulanate  500 mg Oral Q12H  . atorvastatin  40 mg Oral q1800  . escitalopram  5 mg Oral Daily  . furosemide  40 mg Intravenous Q8H  . mouth rinse  15 mL Mouth Rinse BID  . metoprolol tartrate  12.5 mg Oral BID  . polyethylene glycol  17 g Oral BID  . senna-docusate  1 tablet Oral BID   Continuous: . sodium chloride Stopped (04/09/18 1430)  . amiodarone     FFM:[id number] **OR** acetaminophen, HYDROcodone-acetaminophen, ipratropium-albuterol, nitroGLYCERIN, ondansetron **OR** ondansetron (ZOFRAN) IV, sodium phosphate  Assessment/Plan:    Healthcare associated pneumonia with sepsis Patient met sepsis criteria at the time of admission with presence of fever, leukocytosis, lactic acidosis, acute kidney injury.  Patient placed on intravenous vancomycin and Zosyn.  Blood cultures have been negative.  Seen by speech therapy.  On dysphagia 1 diet.  Urine strep antigen and urine Legionella are negative.  Vancomycin was discontinued.  Zosyn changed over to Augmentin today.  Respiratory status is stable.  That could be an element of fluid overload considering gaining weight.  Atrial fibrillation Patient has been in and out of atrial fibrillation.  Cardiology is following.  Patient was loaded with amiodarone and was also placed beta-blocker.  She did convert to sinus rhythm.  However she converted back to  atrial fibrillation on 5/30.  She was placed back on IV amiodarone by cardiology.  They continue to follow closely.    Elevated troponin and abnormal EKG Thought to be demand ischemia due to sepsis.  Cardiology following.  Echocardiogram showed diminished systolic function with wall motion abnormalities.  Start aspirin. Patient is on a beta-blocker.  On statin.  Acute on chronic kidney disease stage 3 Baseline creatinine appears to be around 0.3-1.5.  Creatinine was noted to be climbing.  Patient was given fluids.  Urine output did pick up.  Creatinine improved.  However at the same time she is gained weight.  She became more edematous.  Fluids were discontinued.  Appears to be fluid overloaded today.  Might benefit from diuretics which has been ordered by cardiology.  Monitor creatinine closely.  Continue strict ins and outs and daily weights.  No hydronephrosis noted on CT scan.  UA from 5/25 showed moderate hemoglobin with 0 RBCs.  CK normal.  Acute on chronic systolic CHF with diastolic dysfunction EF noted to be 45%.  Appears to have gained some weight.  Low urine output noted.  She is edematous.  Agree with IV Lasix. Continue strict ins and outs and daily weights.   Acute on chronic anemia, normocytic Baseline hemoglobin 7.9-9.5.  Patient's hemoglobin dropped to 6.8 on 5/27.  She was transfused 2 units of PRBC.  No evidence of overt bleeding.  No concerning findings noted on CT abdomen pelvis.  Ferritin 221.  B12 470.  Folate 35.  Hemoglobin remains stable.  Diabetes mellitus type 2 Stable.  Continue to monitor CBGs.  History of closed fracture of the right hip Status post recent surgery.  PT evaluation.  Acute metabolic encephalopathy Likely due to medical illness.  CT head was unremarkable.  Seems to be close to baseline now.  History  of enucleation of the left eye Stable.  Constipation Improved with laxatives.  DVT Prophylaxis: SCDs    Code Status: DNR Family Communication:  Does with patient.  Caregiver at bedside. Disposition Plan: Management as outlined above.  Await improvement in atrial fibrillation as well as volume overload.    LOS: 6 days   [facility] Pager [phone] 04/10/2018, 11:44 AM  If 7PM-7AM, please contact night-coverage at [email or web address], password [facility]

## 2018-04-10 NOTE — Progress Notes (Signed)
Physical Therapy Treatment Patient Details Name: Calyse Murcia MRN: [id number] DOB: June 19, 1925 Today's Date: 04/10/2018    History of Present Illness Nickayla Mcinnis is a 84 y.o. female with a known history of CKD3, HTN, HLD, PAF, DM2, recent right hip fracture s/p surgical repair (WBAT) presents to the emergency department for evaluation of AMS.  Patient was in a usual state of health until this past week when she has been lethargic.  Baseline is fully alert and oriented per caregiver report to EDP; workup revealing pneumonia, mildly elevated troponins, acute kidney injury    PT Comments    Pt making very slow progress. Pt with RLE rolled into internal rotation in supine and sitting. Explained to pt importance of moving RLE and good positioning. Unable to transfer to chair with Stedy. Attempted use of maxisky but it wasn't charged. Placed lift in charging position to be used later. Pt needs constant encouragement to mobilize.   Follow Up Recommendations  SNF;Supervision/Assistance - 24 hour     Equipment Recommendations  None recommended by PT    Recommendations for Other Services       Precautions / Restrictions Precautions Precautions: Fall Precaution Comments: no L eye Restrictions Weight Bearing Restrictions: Yes RLE Weight Bearing: Weight bearing as tolerated    Mobility  Bed Mobility Overal bed mobility: Needs Assistance Bed Mobility: Supine to Sit;Sit to Supine;Rolling Rolling: Max assist   Supine to sit: +2 for physical assistance;HOB elevated;Total assist Sit to supine: +2 for physical assistance;Max assist   General bed mobility comments: Assist to bring legs off bed, elevate trunk into sitting and bring hips to EOB. Returning to supine pt assisting by lowering trunk. Rolling pt assist by reaching and holding rail with opposite UE.  Transfers Overall transfer level: Needs assistance Equipment used: Ambulation equipment used Transfers: Sit to/from Stand Sit to Stand: +2  physical assistance;Max assist         General transfer comment: Assist to bring hips and trunk up. Pt unable to achieve full standing position and unable to close seat flaps of Stedy. Pt stood x 10 for 10-15 seconds  Ambulation/Gait                 Stairs             Wheelchair Mobility    Modified Rankin (Stroke Patients Only)       Balance Overall balance assessment: Needs assistance Sitting-balance support: Bilateral upper extremity supported Sitting balance-Leahy Scale: Poor Sitting balance - Comments: Pt sat EOB x 15 minutes with min to mod assist. Verbal cues to lean forward Postural control: Posterior lean Standing balance support: Bilateral upper extremity supported Standing balance-Leahy Scale: Zero                              Cognition Arousal/Alertness: Awake/alert Behavior During Therapy: WFL for tasks assessed/performed Overall Cognitive Status: Impaired/Different from baseline Area of Impairment: Following commands;Safety/judgement;Problem solving                       Following Commands: Follows one step commands inconsistently;Follows one step commands with increased time     Problem Solving: Slow processing;Difficulty sequencing;Requires verbal cues General Comments: Suspect some of this limited by pain      Exercises      General Comments        Pertinent Vitals/Pain Pain Assessment: Faces Faces Pain Scale: Hurts worst Pain Location: R hip with  movement Pain Descriptors / Indicators: Operative site guarding;Aching Pain Intervention(s): Limited activity within patient's tolerance;Monitored during session;Premedicated before session;Repositioned    Home Living                      Prior Function            PT Goals (current goals can now be found in the care plan section) Progress towards PT goals: Progressing toward goals(very,very slowly)    Frequency    Min 2X/week      PT Plan  Current plan remains appropriate    Co-evaluation              AM-PAC PT "6 Clicks" Daily Activity  Outcome Measure  Difficulty turning over in bed (including adjusting bedclothes, sheets and blankets)?: Unable Difficulty moving from lying on back to sitting on the side of the bed? : Unable Difficulty sitting down on and standing up from a chair with arms (e.g., wheelchair, bedside commode, etc,.)?: Unable Help needed moving to and from a bed to chair (including a wheelchair)?: Total Help needed walking in hospital room?: Total Help needed climbing 3-5 steps with a railing? : Total 6 Click Score: 6    End of Session Equipment Utilized During Treatment: Gait belt Activity Tolerance: Patient limited by pain;Patient limited by fatigue Patient left: in bed;with call bell/phone within reach;with family/visitor present Nurse Communication: Mobility status;Need for lift equipment PT Visit Diagnosis: Muscle weakness (generalized) (M62.81);Pain;Other abnormalities of gait and mobility (R26.89) Pain - Right/Left: Right Pain - part of body: Hip     Time: 0071-2197 PT Time Calculation (min) (ACUTE ONLY): 36 min  Charges:  $Therapeutic Activity: 23-37 mins                    G Codes:       [facility] PT [place] 04/10/2018, 10:20 AM

## 2018-04-11 DIAGNOSIS — N179 Acute kidney failure, unspecified: Secondary | ICD-10-CM

## 2018-04-11 DIAGNOSIS — I5041 Acute combined systolic (congestive) and diastolic (congestive) heart failure: Secondary | ICD-10-CM

## 2018-04-11 LAB — CBC
HCT: 35 % — ABNORMAL LOW (ref 36.0–46.0)
Hemoglobin: 11.2 g/dL — ABNORMAL LOW (ref 12.0–15.0)
MCH: 29.2 pg (ref 26.0–34.0)
MCHC: 32 g/dL (ref 30.0–36.0)
MCV: 91.4 fL (ref 78.0–100.0)
PLATELETS: 560 10*3/uL — AB (ref 150–400)
RBC: 3.83 MIL/uL — AB (ref 3.87–5.11)
RDW: 16.8 % — ABNORMAL HIGH (ref 11.5–15.5)
WBC: 6.4 10*3/uL (ref 4.0–10.5)

## 2018-04-11 LAB — BASIC METABOLIC PANEL
ANION GAP: 13 (ref 5–15)
BUN: 32 mg/dL — ABNORMAL HIGH (ref 6–20)
CO2: 23 mmol/L (ref 22–32)
Calcium: 8.6 mg/dL — ABNORMAL LOW (ref 8.9–10.3)
Chloride: 102 mmol/L (ref 101–111)
Creatinine, Ser: 1.91 mg/dL — ABNORMAL HIGH (ref 0.44–1.00)
GFR, EST AFRICAN AMERICAN: 26 mL/min — AB (ref >60)
GFR, EST NON AFRICAN AMERICAN: 22 mL/min — AB (ref >60)
Glucose, Bld: 146 mg/dL — ABNORMAL HIGH (ref 65–99)
POTASSIUM: 3.3 mmol/L — AB (ref 3.5–5.1)
Sodium: 138 mmol/L (ref 135–145)

## 2018-04-11 LAB — PROTIME-INR
INR: 2.85
Prothrombin Time: 29.7 seconds — ABNORMAL HIGH (ref 11.4–15.2)

## 2018-04-11 LAB — GLUCOSE, CAPILLARY
GLUCOSE-CAPILLARY: 146 mg/dL — AB (ref 65–99)
GLUCOSE-CAPILLARY: 151 mg/dL — AB (ref 65–99)
Glucose-Capillary: 149 mg/dL — ABNORMAL HIGH (ref 65–99)

## 2018-04-11 MED ORDER — POTASSIUM CHLORIDE CRYS ER 20 MEQ PO TBCR
40.0000 meq | EXTENDED_RELEASE_TABLET | Freq: Once | ORAL | Status: AC
  Administered 2018-04-11: 40 meq via ORAL
  Filled 2018-04-11: qty 2

## 2018-04-11 NOTE — Progress Notes (Signed)
Received a call from Kylie Sanchez stating that this patient called 911. Told the operator that she was alone and that she didn't have her call bell. This RN addressed the concern with the patient and told the patient that she had the call bell beside her. Changed her dressing on right hip. Patient denied pain medicine, call bell in reach and water on bedside table. Will continue to monitor

## 2018-04-11 NOTE — Progress Notes (Signed)
[facility] PROGRESS NOTE  Kylie Sanchez XAJ:[id number] DOB: 06-25-1925 DOA: 04/04/2018  PCP: Unk Pinto, MD  Brief History/Interval Summary: 84 year old female with history of CKD stage III, hypertension, hyperlipidemia, paroxysmal atrial fib, diabetes, recent right hip fracture status post repair, was discharged to skilled nursing facility.  Patient was sent from the skilled nursing facility with a fever of 103 F, lethargy, altered mental status.  Chest x-ray showed patchy infiltrates in both lung bases.  Patient was hospitalized for further management.  Reason for Visit: Sepsis  Consultants: Cardiology  Procedures:  Transthoracic echocardiogram Study Conclusions  - Left ventricle: The cavity size was normal. There was mild   concentric hypertrophy. Systolic function was mildly reduced. The   estimated ejection fraction was in the range of 45% to 50%.   Hypokinesis of the anteroseptal, inferior, inferoseptal, and   apical myocardium. Doppler parameters are consistent with   abnormal left ventricular relaxation (grade 1 diastolic   dysfunction). - Aortic valve: Transvalvular velocity was within the normal range.   There was no stenosis. There was no regurgitation. - Mitral valve: Transvalvular velocity was within the normal range.   There was no evidence for stenosis. There was trivial   regurgitation. - Left atrium: The atrium was mildly dilated. - Right ventricle: The cavity size was normal. Wall thickness was   normal. Systolic function was normal. - Atrial septum: No defect or patent foramen ovale was identified   by color flow Doppler. - Tricuspid valve: There was trivial regurgitation. - Pulmonary arteries: Systolic pressure was within the normal   range. PA peak pressure: 28 mm Hg (S). - Pericardium, extracardiac: A small pericardial effusion was   identified posterior to the heart. Features were not consistent   with tamponade  physiology.  Impressions:  - Compared with the echo 06/2017, wall motion abnormalities are new.   Antibiotics: None  Subjective/Interval History: Patient states that she feels "rotten".  However denies any chest pain shortness of breath.  She feels like she is getting weaker.  Denies any specific complaints.  Her niece is at the bedside.    ROS: Denies any headaches.  Objective:  Vital Signs  Vitals:   04/11/18 0501 04/11/18 0700 04/11/18 0810 04/11/18 1122  BP: (!) 152/69  (!) 143/75 (!) 147/33  Pulse: 84  84 80  Resp: (!) _0 Temp: 97.6 F (36.4 C)  97.9 F (36.6 C) 98 F (36.7 C)  TempSrc: Oral Oral Oral Oral  SpO2: 92%  96% 95%  Weight: 83.5 kg (184 lb)     Height:        Intake/Output Summary (Last 24 hours) at 04/11/2018 1233 Last data filed at 04/11/2018 1044 Gross per 24 hour  Intake 924.11 ml  Output 3400 ml  Net -2475.89 ml   Filed Weights   04/08/18 2141 04/10/18 0541 04/11/18 0501  Weight: 87.5 kg (193 lb) 85.3 kg (188 lb) 83.5 kg (184 lb)    General appearance: Awake alert.  In no distress. Resp: Lungs remain clear to auscultation bilaterally.  No wheezing rales or rhonchi. Cardio: S1-S2 remains irregularly irregular.  No S3-S4.  No rubs murmurs or bruit. GI: Abdomen soft.  Nontender nondistended.  Bowel sounds are present.  No masses organomegaly. Extremities: Edema is improving.  Dressing noted to the right lateral thigh.  Able to move both her feet.   Neurologic: No focal neurological deficits.  Lab Results:  Data Reviewed: I have personally reviewed following labs and imaging studies  CBC: Recent Labs  Lab 04/04/18 2124 04/05/18 0723 04/06/18 0355 04/06/18 1950 04/07/18 0542 04/08/18 0513 04/11/18 0436  WBC 13.0* 11.0* 8.4  --  6.4 6.5 6.4  NEUTROABS 12.0*  --   --   --   --   --   --   HGB 7.7* 7.7* 6.8* 10.1* 9.9* 10.1* 11.2*  HCT 25.3* 25.5* 21.7* 30.8* 31.3* 32.0* 35.0*  MCV 96.6 97.7 95.6  --  92.3 95.0 91.4  PLT 332  249 312  --  335 404* 560*    Basic Metabolic Panel: Recent Labs  Lab 04/05/18 0218  04/07/18 0542 04/08/18 0513 04/09/18 0658 04/10/18 0812 04/11/18 0436  NA  --    < > 136 138 137 139 138  K  --    < > 4.3 3.8 4.4 4.3 3.3*  CL  --    < > 100* 99* 106 109 102  CO2  --    < > 25 26 18* 17* 23  GLUCOSE  --    < > 122* 84 125* 106* 146*  BUN  --    < > 47* 46* 42* 37* 32*  CREATININE  --    < > 2.31* 2.34* 2.15* 2.05* 1.91*  CALCIUM  --    < > 8.1* 8.5* 8.1* 8.4* 8.6*  MG 1.5*  --   --   --   --  1.7  --   PHOS 4.2  --   --   --   --   --   --    < > = values in this interval not displayed.    GFR: Estimated Creatinine Clearance: 19.6 mL/min (A) (by C-G formula based on SCr of 1.91 mg/dL (H)).  Liver Function Tests: Recent Labs  Lab 04/04/18 2124  AST 26  ALT 17  ALKPHOS 89  BILITOT 0.9  PROT 6.0*  ALBUMIN 2.4*     Recent Labs  Lab 04/05/18 1304  AMMONIA 18    Coagulation Profile: Recent Labs  Lab 04/05/18 0548 04/05/18 0723 04/06/18 1950 04/08/18 0513 04/11/18 0436  INR 4.01* 3.59 2.99 3.18 2.85    Cardiac Enzymes: Recent Labs  Lab 04/05/18 0218 04/05/18 0723 04/05/18 1304 04/08/18 0513  CKTOTAL  --   --   --  53  TROPONINI 0.36* 0.31* 0.38*  --     CBG: Recent Labs  Lab 04/10/18 1233 04/10/18 1655 04/10/18 2145 04/11/18 0615 04/11/18 1114  GLUCAP 152* 141* 137* 146* 151*     Recent Results (from the past 240 hour(s))  Urine culture     Status: Abnormal   Collection Time: 04/04/18  9:07 PM  Result Value Ref Range Status   Specimen Description URINE, RANDOM  Final   Special Requests   Final    NONE Performed at [facility], [place] [address], [place], [place] [address]    Culture 40,000 COLONIES/mL YEAST (A)  Final   Report Status 04/06/2018 FINAL  Final  Blood Culture (routine x 2)     Status: None   Collection Time: 04/04/18  9:27 PM  Result Value Ref Range Status   Specimen Description BLOOD LEFT ARM  Final   Special  Requests   Final    BOTTLES DRAWN AEROBIC AND ANAEROBIC Blood Culture adequate volume   Culture   Final    NO GROWTH 5 DAYS Performed at [facility], [place] [address] [place], [place] [address]    Report Status 04/09/2018 FINAL  Final  Blood Culture (routine x 2)     Status: None   Collection Time: 04/05/18  2:10 AM  Result Value Ref Range Status   Specimen Description BLOOD RIGHT ARM  Final   Special Requests   Final    BOTTLES DRAWN AEROBIC ONLY Blood Culture results may not be optimal due to an inadequate volume of blood received in culture bottles   Culture   Final    NO GROWTH 5 DAYS Performed at [facility], [address] [place], [place] [address]    Report Status 04/10/2018 FINAL  Final      Radiology Studies: No results found.   Medications:  Scheduled: . amoxicillin-clavulanate  500 mg Oral Q12H  . aspirin EC  81 mg Oral Daily  . atorvastatin  40 mg Oral q1800  . escitalopram  5 mg Oral Daily  . furosemide  40 mg Intravenous Q8H  . mouth rinse  15 mL Mouth Rinse BID  . metoprolol tartrate  12.5 mg Oral BID  . polyethylene glycol  17 g Oral BID  . senna-docusate  1 tablet Oral BID   Continuous: . sodium chloride Stopped (04/09/18 1430)  . amiodarone 30 mg/hr (04/11/18 0515)   ASN:[id number] **OR** acetaminophen, HYDROcodone-acetaminophen, ipratropium-albuterol, nitroGLYCERIN, ondansetron **OR** ondansetron (ZOFRAN) IV, sodium phosphate  Assessment/Plan:    Healthcare associated pneumonia with sepsis Patient met sepsis criteria at the time of admission with presence of fever, leukocytosis, lactic acidosis, acute kidney injury.  Patient was placed on intravenous vancomycin and Zosyn.  Blood cultures have been negative.  Seen by speech therapy.  On dysphagia 1 diet.  Urine strep antigen and urine Legionella are negative.  Vancomycin was discontinued.  Zosyn changed over to Augmentin.  Respiratory status is stable.  Possible element of fluid  overload considering weight gain.  Patient's respiratory status remains stable.  Atrial fibrillation Patient has been in and out of atrial fibrillation.  Cardiology continues to follow.  She remains on intravenous amiodarone.  Also on low-dose beta-blocker.  She had initially converted to sinus rhythm but then went back into atrial fibrillation.  Continue to monitor.  Not a candidate for anticoagulation due to anemia.  Warfarin was discontinued.  INR remains therapeutic.  It will gradually come down.  Elevated troponin and abnormal EKG Thought to be demand ischemia due to sepsis.  Cardiology following.  Echocardiogram showed diminished systolic function with wall motion abnormalities.  Start aspirin. Patient is on a beta-blocker.  On statin.  Acute on chronic kidney disease stage 3/hypokalemia Baseline creatinine appears to be around 0.3-1.5.  Creatinine was noted to be climbing.  Patient was given fluids.  Urine output did pick up.  Creatinine improved.  However at the same time she is gained weight.  She became more edematous.  Fluids were discontinued.  She was noted to be more fluid overloaded yesterday.  She was given Lasix intravenously with good diuresis.  Creatinine has improved.  Strict ins and O's and daily weights.  CK normal.  No hydronephrosis on CT scan.  Replace potassium.  Acute on chronic systolic CHF with diastolic dysfunction EF noted to be 45%.  Patient started on intravenous Lasix.  She has diuresed well.  Weight has decreased.  Continue Lasix for now per cardiology.  Continue strict ins and outs and daily weights.   Acute on chronic anemia, normocytic Baseline hemoglobin 7.9-9.5.  Patient's hemoglobin dropped to 6.8 on 5/27.  She was transfused 2 units of PRBC.  No evidence of  overt bleeding.  No concerning findings noted on CT abdomen pelvis.  Ferritin 221.  B12 470.  Folate 35.  Hemoglobin remains stable.  Diabetes mellitus type 2 Stable.  Continue to monitor  CBGs.  History of closed fracture of the right hip Status post recent surgery.  PT evaluation.  Staples removed by orthopedics.  Continues to have limitation in range of motion of the right hip.  Acute metabolic encephalopathy Likely due to medical illness.  CT head was unremarkable.  Seems to be close to baseline now.  History of enucleation of the left eye Stable.  Constipation Improved with laxatives.  DVT Prophylaxis: SCDs    Code Status: DNR Family Communication: Cussed with the patient and her niece who was at the bedside. Disposition Plan: Management as outlined above.  Await improvement in atrial fibrillation as well as volume overload.    LOS: 7 days   [facility] Pager [phone] 04/11/2018, 12:33 PM  If 7PM-7AM, please contact night-coverage at [email or web address], password [facility]

## 2018-04-11 NOTE — Progress Notes (Addendum)
Progress Note  Patient Name: Kylie Sanchez Date of Encounter: 04/11/2018  Primary Cardiologist: Thompson Grayer, MD   Subjective   Still having episodes of PAF.  Patient denies any chest pain but still says she is short of breath although she thinks she is improved.  Is mad because no one is gotten her out of bed in 2 days.  Inpatient Medications    Scheduled Meds: . amoxicillin-clavulanate  500 mg Oral Q12H  . aspirin EC  81 mg Oral Daily  . atorvastatin  40 mg Oral q1800  . escitalopram  5 mg Oral Daily  . furosemide  40 mg Intravenous Q8H  . mouth rinse  15 mL Mouth Rinse BID  . metoprolol tartrate  12.5 mg Oral BID  . polyethylene glycol  17 g Oral BID  . potassium chloride  40 mEq Oral Once  . senna-docusate  1 tablet Oral BID   Continuous Infusions: . sodium chloride Stopped (04/09/18 1430)  . amiodarone 30 mg/hr (04/11/18 0515)   PRN Meds: acetaminophen **OR** acetaminophen, HYDROcodone-acetaminophen, ipratropium-albuterol, nitroGLYCERIN, ondansetron **OR** ondansetron (ZOFRAN) IV, sodium phosphate   Vital Signs    Vitals:   04/10/18 2026 04/11/18 0501 04/11/18 0700 04/11/18 0810  BP: (!) 154/67 (!) 152/69  (!) 143/75  Pulse: 79 84  84  Resp: 16 (!) 26  17  Temp: 97.6 F (36.4 C) 97.6 F (36.4 C)  97.9 F (36.6 C)  TempSrc: Oral Oral Oral Oral  SpO2: 92% 92%  96%  Weight:  184 lb (83.5 kg)    Height:        Intake/Output Summary (Last 24 hours) at 04/11/2018 0846 Last data filed at 04/11/2018 0515 Gross per 24 hour  Intake 924.11 ml  Output 2400 ml  Net -1475.89 ml   Filed Weights   04/08/18 2141 04/10/18 0541 04/11/18 0501  Weight: 193 lb (87.5 kg) 188 lb (85.3 kg) 184 lb (83.5 kg)    Telemetry    In and out of atrial fibrillation and sinus rhythm - Personally Reviewed  ECG    No new EKG to review- Personally Reviewed  Physical Exam   GEN: No acute distress.   Neck: No JVD Cardiac: RRR, no murmurs, rubs, or gallops.  Respiratory: Clear to  auscultation bilaterally. GI: Soft, nontender, non-distended  MS: No edema; No deformity. Neuro:  Nonfocal  Psych: Normal affect   Labs    Chemistry Recent Labs  Lab 04/04/18 2124  04/09/18 0658 04/10/18 0812 04/11/18 0436  NA 133*   < > 137 139 138  K 4.9   < > 4.4 4.3 3.3*  CL 93*   < > 106 109 102  CO2 26   < > 18* 17* 23  GLUCOSE 280*   < > 125* 106* 146*  BUN 44*   < > 42* 37* 32*  CREATININE 2.15*   < > 2.15* 2.05* 1.91*  CALCIUM 8.3*   < > 8.1* 8.4* 8.6*  PROT 6.0*  --   --   --   --   ALBUMIN 2.4*  --   --   --   --   AST 26  --   --   --   --   ALT 17  --   --   --   --   ALKPHOS 89  --   --   --   --   BILITOT 0.9  --   --   --   --   GFRNONAA 19*   < >  19* 20* 22*  GFRAA 22*   < > 22* 23* 26*  ANIONGAP 14   < > 13 13 13    < > = values in this interval not displayed.     Hematology Recent Labs  Lab 04/07/18 0542 04/08/18 0513 04/11/18 0436  WBC 6.4 6.5 6.4  RBC 3.39* 3.37* 3.83*  HGB 9.9* 10.1* 11.2*  HCT 31.3* 32.0* 35.0*  MCV 92.3 95.0 91.4  MCH 29.2 30.0 29.2  MCHC 31.6 31.6 32.0  RDW 16.5* 16.5* 16.8*  PLT 335 404* 560*    Cardiac Enzymes Recent Labs  Lab 04/05/18 0218 04/05/18 0723 04/05/18 1304  TROPONINI 0.36* 0.31* 0.38*    Recent Labs  Lab 04/04/18 2155  TROPIPOC 0.26*     BNPNo results for input(s): BNP, PROBNP in the last 168 hours.   DDimer No results for input(s): DDIMER in the last 168 hours.   Radiology    No results found.  Cardiac Studies   2D echocardiogram 04/07/2018 Study Conclusions  - Left ventricle: The cavity size was normal. There was mild   concentric hypertrophy. Systolic function was mildly reduced. The   estimated ejection fraction was in the range of 45% to 50%.   Hypokinesis of the anteroseptal, inferior, inferoseptal, and   apical myocardium. Doppler parameters are consistent with   abnormal left ventricular relaxation (grade 1 diastolic   dysfunction). - Aortic valve: Transvalvular  velocity was within the normal range.   There was no stenosis. There was no regurgitation. - Mitral valve: Transvalvular velocity was within the normal range.   There was no evidence for stenosis. There was trivial   regurgitation. - Left atrium: The atrium was mildly dilated. - Right ventricle: The cavity size was normal. Wall thickness was   normal. Systolic function was normal. - Atrial septum: No defect or patent foramen ovale was identified   by color flow Doppler. - Tricuspid valve: There was trivial regurgitation. - Pulmonary arteries: Systolic pressure was within the normal   range. PA peak pressure: 28 mm Hg (S). - Pericardium, extracardiac: A small pericardial effusion was   identified posterior to the heart. Features were not consistent   with tamponade physiology.  Impressions:  - Compared with the echo 06/2017, wall motion abnormalities are new.  Patient Profile     84 y.o. 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   Assessment & Plan    1.  Paroxysmal atrial fibrillation -She continues to have intermittent atrial fibrillation.  Currently she is in sinus rhythm.   -Continue IV amnio load.   -If amio fails this time I would focus on rate control only, she seems to feel much better in SR as in a-fib she tends to be in persistent RVR.  -Continue Lopressor 12.5 mg twice daily -not currently a candidate for anticoagulation due to anemia  2. Acute combined systolic and diastolic CHF  -weight down 4 pounds from yesterday -She put out 2.4 L yesterday and but is still net +5.5 L -Continue Lasix 40 mg iv Q8hrs -Creatinine improved yesterday with diuresis and is now  down from 2.34>>1.91 -Continue to follow renal function while diuresing  3.  Elevated troponin -Likely related to demand ischemia in the setting of  acute medical illness/ anemia -Not a candidate for anticoagulation or CV procedures  3. Pneumonia following recent hip fracture -Per primary team -Blood cultures negative  4. Acute anemia -Coumadin is on hold -Hemoglobin up to 11.2 today after transfusion yesterday -Primary team evaluating cause of her anemia  5.  Hypokalemia -Replete potassium per [facility] -Keep K>4    For questions or updates, please contact [place] Please consult [email or web address] for contact info under Cardiology/STEMI.      Signed, Fransico Him, MD  04/11/2018, 8:46 AM

## 2018-04-12 LAB — GLUCOSE, CAPILLARY
GLUCOSE-CAPILLARY: 128 mg/dL — AB (ref 65–99)
GLUCOSE-CAPILLARY: 185 mg/dL — AB (ref 65–99)
GLUCOSE-CAPILLARY: 156 mg/dL — AB (ref 65–99)
GLUCOSE-CAPILLARY: 131 mg/dL — AB (ref 65–99)

## 2018-04-12 LAB — BASIC METABOLIC PANEL
Anion gap: 13 (ref 5–15)
BUN: 28 mg/dL — ABNORMAL HIGH (ref 6–20)
CHLORIDE: 100 mmol/L — AB (ref 101–111)
CO2: 25 mmol/L (ref 22–32)
CREATININE: 1.76 mg/dL — AB (ref 0.44–1.00)
Calcium: 8.5 mg/dL — ABNORMAL LOW (ref 8.9–10.3)
GFR calc non Af Amer: 24 mL/min — ABNORMAL LOW (ref >60)
GFR, EST AFRICAN AMERICAN: 28 mL/min — AB (ref >60)
Glucose, Bld: 130 mg/dL — ABNORMAL HIGH (ref 65–99)
POTASSIUM: 4 mmol/L (ref 3.5–5.1)
SODIUM: 138 mmol/L (ref 135–145)

## 2018-04-12 NOTE — Progress Notes (Signed)
Progress Note  Patient Name: Kylie Sanchez Date of Encounter: 04/12/2018  Primary Cardiologist: Thompson Grayer, MD   Subjective   Patient went back into atrial fibrillation around 7:00 last night.  She remains in A. fib with heart rate around 100 bpm  Inpatient Medications    Scheduled Meds: . amoxicillin-clavulanate  500 mg Oral Q12H  . aspirin EC  81 mg Oral Daily  . atorvastatin  40 mg Oral q1800  . escitalopram  5 mg Oral Daily  . furosemide  40 mg Intravenous Q8H  . mouth rinse  15 mL Mouth Rinse BID  . metoprolol tartrate  12.5 mg Oral BID  . polyethylene glycol  17 g Oral BID  . senna-docusate  1 tablet Oral BID   Continuous Infusions: . amiodarone 30 mg/hr (04/12/18 0631)   PRN Meds: acetaminophen **OR** acetaminophen, HYDROcodone-acetaminophen, ipratropium-albuterol, nitroGLYCERIN, ondansetron **OR** ondansetron (ZOFRAN) IV, sodium phosphate   Vital Signs    Vitals:   04/11/18 1950 04/12/18 0348 04/12/18 0500 04/12/18 0800  BP: 125/83   128/71  Pulse: 88     Resp: 16   19  Temp: 98.2 F (36.8 C) 98.3 F (36.8 C)  98.1 F (36.7 C)  TempSrc: Oral Oral  Oral  SpO2: 98% 97%  99%  Weight:   182 lb (82.6 kg)   Height:        Intake/Output Summary (Last 24 hours) at 04/12/2018 1026 Last data filed at 04/12/2018 0655 Gross per 24 hour  Intake 180 ml  Output 2950 ml  Net -2770 ml   Filed Weights   04/10/18 0541 04/11/18 0501 04/12/18 0500  Weight: 188 lb (85.3 kg) 184 lb (83.5 kg) 182 lb (82.6 kg)    Telemetry    Atrial fibrillation with CVR - Personally Reviewed  ECG    No new EKG to review - Personally Reviewed  Physical Exam   GEN: No acute distress.   Neck: No JVD Cardiac:  Irregularly irregular, no murmurs, rubs, or gallops.  Respiratory: Clear to auscultation bilaterally. GI: Soft, nontender, non-distended  MS:  To 2+ pitting lower extremity edema; No deformity. Neuro:  Nonfocal  Psych: Normal affect   Labs    Chemistry Recent Labs  Lab  04/10/18 0812 04/11/18 0436 04/12/18 0532  NA 139 138 138  K 4.3 3.3* 4.0  CL 109 102 100*  CO2 17* 23 25  GLUCOSE 106* 146* 130*  BUN 37* 32* 28*  CREATININE 2.05* 1.91* 1.76*  CALCIUM 8.4* 8.6* 8.5*  GFRNONAA 20* 22* 24*  GFRAA 23* 26* 28*  ANIONGAP 13 13 13      Hematology Recent Labs  Lab 04/07/18 0542 04/08/18 0513 04/11/18 0436  WBC 6.4 6.5 6.4  RBC 3.39* 3.37* 3.83*  HGB 9.9* 10.1* 11.2*  HCT 31.3* 32.0* 35.0*  MCV 92.3 95.0 91.4  MCH 29.2 30.0 29.2  MCHC 31.6 31.6 32.0  RDW 16.5* 16.5* 16.8*  PLT 335 404* 560*    Cardiac Enzymes Recent Labs  Lab 04/05/18 1304  TROPONINI 0.38*   No results for input(s): TROPIPOC in the last 168 hours.   BNPNo results for input(s): BNP, PROBNP in the last 168 hours.   DDimer No results for input(s): DDIMER in the last 168 hours.   Radiology    No results found.  Cardiac Studies   2D echocardiogram 04/07/2018 Study Conclusions  - Left ventricle: The cavity size was normal. There was mild concentric hypertrophy. Systolic function was mildly reduced. The estimated ejection fraction was in  the range of 45% to 50%. Hypokinesis of the anteroseptal, inferior, inferoseptal, and apical myocardium. Doppler parameters are consistent with abnormal left ventricular relaxation (grade 1 diastolic dysfunction). - Aortic valve: Transvalvular velocity was within the normal range. There was no stenosis. There was no regurgitation. - Mitral valve: Transvalvular velocity was within the normal range. There was no evidence for stenosis. There was trivial regurgitation. - Left atrium: The atrium was mildly dilated. - Right ventricle: The cavity size was normal. Wall thickness was normal. Systolic function was normal. - Atrial septum: No defect or patent foramen ovale was identified by color flow Doppler. - Tricuspid valve: There was trivial regurgitation. - Pulmonary arteries: Systolic pressure was within the  normal range. PA peak pressure: 28 mm Hg (S). - Pericardium, extracardiac: A small pericardial effusion was identified posterior to the heart. Features were not consistent with tamponade physiology.  Impressions:  - Compared with the echo 06/2017, wall motion abnormalities are new.   Patient Profile     84 y.o. 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   Assessment & Plan    1. Paroxysmal atrial fibrillation -She continues to have intermittent atrial fibrillation.  Currently she is in atrial fibrillation with a heart rate of 105 bpm since 7:00 last night -Continue IV amio load and hopefully will convert back to sinus rhythm today -If amio fails this time I would focus on rate control only, she seems to feel much better in SRas in a-fib she tends to be in persistent RVR. -Continue Lopressor 12.5 mg twice daily -BP stable at 128/71 mmHg -not currently a candidate for anticoagulation due to anemia -Repeat EKG to assess QTC since she is on IV amio  2.Acute combined systolic and diastolic CHF  -weight down 2lbs from yesterday and 11lbs from admit -She put out 2.9 L yesterday and but is still net +2.7L -She still appears volume overloaded with lower extremity edema -Continue Lasix 40 mg iv Q8hrs  -Creatinine continues to improve.  Creatinine was 2.34 at peak and is now 1.76 today -Continue to follow renal function while diuresing  3.  Elevated troponin -Minimally elevated with flat trend (0.36> 0.31> 0.38) -Likely related to demand ischemia in the setting of acute medical illness/ anemia -Not a candidate for anticoagulation or CV procedures  3. Pneumonia following  recent hip fracture -Per primary team -Blood cultures negative  4. Acute anemia -Coumadin is on hold -Hemoglobin up to 11.2 yesterday after transfusion  -no CBC today to review -Primary team evaluating cause of her anemia  5.  Hypokalemia -Repleted K 4 today -Keep K>4 and Mag > 2  For questions or updates, please contact [place] Please consult [email or web address] for contact info under Cardiology/STEMI.      Signed, Fransico Him, MD  04/12/2018, 10:26 AM

## 2018-04-12 NOTE — Progress Notes (Signed)
[facility] PROGRESS NOTE  Eric Morganti MMN:[id number] DOB: [birth year] DOA: 04/04/2018  PCP: Unk Pinto, MD  Brief History/Interval Summary: 84 year old female with history of CKD stage III, hypertension, hyperlipidemia, paroxysmal atrial fib, diabetes, recent right hip fracture status post repair, was discharged to skilled nursing facility.  Patient was sent from the skilled nursing facility with a fever of 103 F, lethargy, altered mental status.  Chest x-ray showed patchy infiltrates in both lung bases.  Patient was hospitalized for further management.  She also developed atrial fibrillation with RVR.  Cardiology was consulted.  Developed fluid overload as well.  Reason for Visit: Sepsis  Consultants: Cardiology  Procedures:  Transthoracic echocardiogram Study Conclusions  - Left ventricle: The cavity size was normal. There was mild   concentric hypertrophy. Systolic function was mildly reduced. The   estimated ejection fraction was in the range of 45% to 50%.   Hypokinesis of the anteroseptal, inferior, inferoseptal, and   apical myocardium. Doppler parameters are consistent with   abnormal left ventricular relaxation (grade 1 diastolic   dysfunction). - Aortic valve: Transvalvular velocity was within the normal range.   There was no stenosis. There was no regurgitation. - Mitral valve: Transvalvular velocity was within the normal range.   There was no evidence for stenosis. There was trivial   regurgitation. - Left atrium: The atrium was mildly dilated. - Right ventricle: The cavity size was normal. Wall thickness was   normal. Systolic function was normal. - Atrial septum: No defect or patent foramen ovale was identified   by color flow Doppler. - Tricuspid valve: There was trivial regurgitation. - Pulmonary arteries: Systolic pressure was within the normal   range. PA peak pressure: 28 mm Hg (S). - Pericardium, extracardiac: A small pericardial effusion was  identified posterior to the heart. Features were not consistent   with tamponade physiology.  Impressions:  - Compared with the echo 06/2017, wall motion abnormalities are new.   Antibiotics: None  Subjective/Interval History: Patient distracted and confused at times.  Today she states that she is feeling well.  Denies any complaints.  No nausea vomiting.  Reports better appetite today.  Her caregiver at the bedside.  ROS: Denies any headaches  Objective:  Vital Signs  Vitals:   04/11/18 1950 04/12/18 0348 04/12/18 0500 04/12/18 0800  BP: 125/83   128/71  Pulse: 88     Resp: 16   19  Temp: 98.2 F (36.8 C) 98.3 F (36.8 C)  98.1 F (36.7 C)  TempSrc: Oral Oral  Oral  SpO2: 98% 97%  99%  Weight:   82.6 kg (182 lb)   Height:        Intake/Output Summary (Last 24 hours) at 04/12/2018 0858 Last data filed at 04/12/2018 0655 Gross per 24 hour  Intake 180 ml  Output 2950 ml  Net -2770 ml   Filed Weights   04/10/18 0541 04/11/18 0501 04/12/18 0500  Weight: 85.3 kg (188 lb) 83.5 kg (184 lb) 82.6 kg (182 lb)    General appearance: Awake alert.  In no distress. Resp: Good air entry bilaterally.  Normal effort at rest.  No wheezing. Cardio S1-S2 remains irregularly irregular.  No S3-S4.  No rubs murmurs or bruit. GI: Abdomen is soft.  Nontender nondistended.  Bowel sounds are present.  No masses organomegaly Extremities: Edema is improving in both lower extremities.  Dressing over the right thigh.  No active drainage noted.  Able to move both feet.  Some limitation of movement in  the right hip joint.  Denies any falls since her surgery. Neurologic: No focal deficits.  Lab Results:  Data Reviewed: I have personally reviewed following labs and imaging studies  CBC: Recent Labs  Lab 04/06/18 0355 04/06/18 1950 04/07/18 0542 04/08/18 0513 04/11/18 0436  WBC 8.4  --  6.4 6.5 6.4  HGB 6.8* 10.1* 9.9* 10.1* 11.2*  HCT 21.7* 30.8* 31.3* 32.0* 35.0*  MCV 95.6  --  92.3  95.0 91.4  PLT 312  --  335 404* 560*    Basic Metabolic Panel: Recent Labs  Lab 04/08/18 0513 04/09/18 0658 04/10/18 0812 04/11/18 0436 04/12/18 0532  NA 138 137 139 138 138  K 3.8 4.4 4.3 3.3* 4.0  CL 99* 106 109 102 100*  CO2 26 18* 17* 23 25  GLUCOSE 84 125* 106* 146* 130*  BUN 46* 42* 37* 32* 28*  CREATININE 2.34* 2.15* 2.05* 1.91* 1.76*  CALCIUM 8.5* 8.1* 8.4* 8.6* 8.5*  MG  --   --  1.7  --   --     GFR: Estimated Creatinine Clearance: 21.2 mL/min (A) (by C-G formula based on SCr of 1.76 mg/dL (H)).   Recent Labs  Lab 04/05/18 1304  AMMONIA 18    Coagulation Profile: Recent Labs  Lab 04/06/18 1950 04/08/18 0513 04/11/18 0436  INR 2.99 3.18 2.85    Cardiac Enzymes: Recent Labs  Lab 04/05/18 1304 04/08/18 0513  CKTOTAL  --  53  TROPONINI 0.38*  --     CBG: Recent Labs  Lab 04/10/18 2145 04/11/18 0615 04/11/18 1114 04/11/18 2136 04/12/18 0647  GLUCAP 137* 146* 151* 149* 128*     Recent Results (from the past 240 hour(s))  Urine culture     Status: Abnormal   Collection Time: 04/04/18  9:07 PM  Result Value Ref Range Status   Specimen Description URINE, RANDOM  Final   Special Requests   Final    NONE Performed at [facility], [place] [address] [place], [place] [address]    Culture 40,000 COLONIES/mL YEAST (A)  Final   Report Status 04/06/2018 FINAL  Final  Blood Culture (routine x 2)     Status: None   Collection Time: 04/04/18  9:27 PM  Result Value Ref Range Status   Specimen Description BLOOD LEFT ARM  Final   Special Requests   Final    BOTTLES DRAWN AEROBIC AND ANAEROBIC Blood Culture adequate volume   Culture   Final    NO GROWTH 5 DAYS Performed at [facility], [place] [address] [place], [place] [address]    Report Status 04/09/2018 FINAL  Final  Blood Culture (routine x 2)     Status: None   Collection Time: 04/05/18  2:10 AM  Result Value Ref Range Status   Specimen Description BLOOD RIGHT ARM  Final    Special Requests   Final    BOTTLES DRAWN AEROBIC ONLY Blood Culture results may not be optimal due to an inadequate volume of blood received in culture bottles   Culture   Final    NO GROWTH 5 DAYS Performed at [facility], [place] [address] [place], [place] [address]    Report Status 04/10/2018 FINAL  Final      Radiology Studies: No results found.   Medications:  Scheduled: . amoxicillin-clavulanate  500 mg Oral Q12H  . aspirin EC  81 mg Oral Daily  . atorvastatin  40 mg Oral q1800  . escitalopram  5  mg Oral Daily  . furosemide  40 mg Intravenous Q8H  . mouth rinse  15 mL Mouth Rinse BID  . metoprolol tartrate  12.5 mg Oral BID  . polyethylene glycol  17 g Oral BID  . senna-docusate  1 tablet Oral BID   Continuous: . amiodarone 30 mg/hr (04/12/18 0631)   VEL:[id number] **OR** acetaminophen, HYDROcodone-acetaminophen, ipratropium-albuterol, nitroGLYCERIN, ondansetron **OR** ondansetron (ZOFRAN) IV, sodium phosphate  Assessment/Plan:    Healthcare associated pneumonia with sepsis Patient met sepsis criteria at the time of admission with presence of fever, leukocytosis, lactic acidosis, acute kidney injury.  Patient was placed on intravenous vancomycin and Zosyn.  Blood cultures have been negative.  Seen by speech therapy.  On dysphagia 1 diet.  Urine strep antigen and urine Legionella are negative.  Vancomycin was discontinued.  Zosyn changed over to Augmentin.  Respiratory status is stable.  Possible element of fluid overload considering weight gain.  Patient's respiratory status remains stable.  Atrial fibrillation Patient has been in and out of atrial fibrillation.  Cardiology continues to follow.  She remains on intravenous amiodarone.  Also on low-dose beta-blocker.  She had initially converted to sinus rhythm but then went back into atrial fibrillation.  Continue to monitor.  Not a candidate for anticoagulation due to anemia.  Warfarin was discontinued.  INR  remains therapeutic.  It will gradually come down.  Elevated troponin and abnormal EKG Thought to be demand ischemia due to sepsis.  Cardiology following.  Echocardiogram showed diminished systolic function with wall motion abnormalities.  Start aspirin. Patient is on a beta-blocker.  On statin.  Does not appear that she is going to need any further testing.  Acute on chronic kidney disease stage 3/hypokalemia Baseline creatinine appears to be around 0.3-1.5.  Creatinine was noted to be climbing.  Patient was given fluids.  Urine output did pick up.  Creatinine improved.  However at the same time she gained weight.  She became more edematous.  Fluids were discontinued.  Patient was started on intravenous Lasix.  She has diuresed well.  Creatinine has improved to 1.76.  Continue strict ins and outs and daily weights.  No hydronephrosis noted on CT scan.    Acute on chronic systolic CHF with diastolic dysfunction EF noted to be 45%.  Patient started on intravenous Lasix.  She has diuresed well.  Weight has decreased.  Continue Lasix for now per cardiology.  Continue strict ins and outs and daily weights.   Acute on chronic anemia, normocytic Baseline hemoglobin 7.9-9.5.  Patient's hemoglobin dropped to 6.8 on 5/27.  She was transfused 2 units of PRBC.  No evidence of overt bleeding.  No concerning findings noted on CT abdomen pelvis.  Ferritin 221.  B12 470.  Folate 35.  Hemoglobin remains stable.  Diabetes mellitus type 2 ABGs are stable.  Continue to monitor.  History of closed fracture of the right hip Status post recent surgery.  PT evaluation.  Staples removed by orthopedics.  Continues to have limitation in range of motion of the right hip.  Dressing to be changed today.  Acute metabolic encephalopathy Likely due to medical illness.  CT head was unremarkable.  Seems to be close to baseline now.  History of enucleation of the left eye Stable.  Constipation Improved with  laxatives.  DVT Prophylaxis: SCDs    Code Status: DNR Family Communication: Cussed with the patient and her niece who was at the bedside. Disposition Plan: Management as outlined above.  Await improvement in atrial fibrillation  as well as volume overload.    LOS: 8 days   [facility] Pager [phone] 04/12/2018, 8:58 AM  If 7PM-7AM, please contact night-coverage at [email or web address], password [facility]

## 2018-04-13 ENCOUNTER — Ambulatory Visit (INDEPENDENT_AMBULATORY_CARE_PROVIDER_SITE_OTHER): Payer: Medicare Other | Admitting: Orthopaedic Surgery

## 2018-04-13 ENCOUNTER — Inpatient Hospital Stay (HOSPITAL_COMMUNITY): Payer: Medicare Other

## 2018-04-13 DIAGNOSIS — I5043 Acute on chronic combined systolic (congestive) and diastolic (congestive) heart failure: Secondary | ICD-10-CM

## 2018-04-13 DIAGNOSIS — M7989 Other specified soft tissue disorders: Secondary | ICD-10-CM

## 2018-04-13 LAB — BASIC METABOLIC PANEL
Anion gap: 13 (ref 5–15)
BUN: 28 mg/dL — AB (ref 6–20)
CHLORIDE: 103 mmol/L (ref 101–111)
CO2: 22 mmol/L (ref 22–32)
Calcium: 8.3 mg/dL — ABNORMAL LOW (ref 8.9–10.3)
Creatinine, Ser: 1.72 mg/dL — ABNORMAL HIGH (ref 0.44–1.00)
GFR calc Af Amer: 29 mL/min — ABNORMAL LOW (ref >60)
GFR calc non Af Amer: 25 mL/min — ABNORMAL LOW (ref >60)
Glucose, Bld: 134 mg/dL — ABNORMAL HIGH (ref 65–99)
Potassium: 3.8 mmol/L (ref 3.5–5.1)
Sodium: 138 mmol/L (ref 135–145)

## 2018-04-13 LAB — GLUCOSE, CAPILLARY
Glucose-Capillary: 150 mg/dL — ABNORMAL HIGH (ref 65–99)
Glucose-Capillary: 161 mg/dL — ABNORMAL HIGH (ref 65–99)
Glucose-Capillary: 161 mg/dL — ABNORMAL HIGH (ref 65–99)
Glucose-Capillary: 134 mg/dL — ABNORMAL HIGH (ref 65–99)

## 2018-04-13 LAB — PROTIME-INR
INR: 1.47
PROTHROMBIN TIME: 17.7 s — AB (ref 11.4–15.2)

## 2018-04-13 MED ORDER — AMIODARONE HCL 200 MG PO TABS
400.0000 mg | ORAL_TABLET | Freq: Every day | ORAL | Status: DC
  Administered 2018-04-13 – 2018-04-15 (×3): 400 mg via ORAL
  Filled 2018-04-13 (×3): qty 2

## 2018-04-13 MED ORDER — WARFARIN SODIUM 2 MG PO TABS
2.0000 mg | ORAL_TABLET | Freq: Once | ORAL | Status: AC
  Administered 2018-04-13: 2 mg via ORAL
  Filled 2018-04-13: qty 1

## 2018-04-13 MED ORDER — AMIODARONE HCL 200 MG PO TABS: 200.0000 mg | ORAL_TABLET | Freq: Every day | ORAL | Status: DC

## 2018-04-13 MED ORDER — WARFARIN - PHARMACIST DOSING INPATIENT
Freq: Every day | Status: DC
Start: 2018-04-13 — End: 2018-04-15

## 2018-04-13 MED ORDER — AMOXICILLIN-POT CLAVULANATE 400-57 MG/5ML PO SUSR
500.0000 mg | Freq: Two times a day (BID) | ORAL | Status: AC
  Administered 2018-04-13 – 2018-04-14 (×3): 500 mg via ORAL
  Filled 2018-04-13 (×3): qty 6.3

## 2018-04-13 NOTE — Progress Notes (Addendum)
Progress Note  Patient Name: Kylie Sanchez Date of Encounter: 04/13/2018  Primary Cardiologist: Thompson Grayer, MD   Subjective   Alert at least partially oriented, looks comfortable. In and out of paroxysmal atrial fibrillation overnight, but always with good rate control.  Persistently in normal sinus rhythm this morning.  Occasional ventricular pacing seen while she was in atrial fibrillation, but no pacing at this time. Has bilateral lower extremity edema, asymmetrical, more prominent in the right limb, where she had surgery. INR has been supratherapeutic since admission, down to 2.85 today.  Inpatient Medications    Scheduled Meds: . amoxicillin-clavulanate  500 mg Oral Q12H  . aspirin EC  81 mg Oral Daily  . atorvastatin  40 mg Oral q1800  . escitalopram  5 mg Oral Daily  . furosemide  40 mg Intravenous Q8H  . mouth rinse  15 mL Mouth Rinse BID  . metoprolol tartrate  12.5 mg Oral BID  . polyethylene glycol  17 g Oral BID  . senna-docusate  1 tablet Oral BID   Continuous Infusions: . amiodarone 30 mg/hr (04/13/18 0655)   PRN Meds: acetaminophen **OR** acetaminophen, HYDROcodone-acetaminophen, ipratropium-albuterol, nitroGLYCERIN, ondansetron **OR** ondansetron (ZOFRAN) IV, sodium phosphate   Vital Signs    Vitals:   04/13/18 0306 04/13/18 0424 04/13/18 0816 04/13/18 0900  BP: 90/79  137/71 133/89  Pulse: 87  81   Resp: 14  14 17   Temp: 97.7 F (36.5 C)  97.6 F (36.4 C)   TempSrc: Oral  Oral   SpO2: 97% 98% 94%   Weight: 182 lb (82.6 kg)     Height:        Intake/Output Summary (Last 24 hours) at 04/13/2018 0937 Last data filed at 04/13/2018 0309 Gross per 24 hour  Intake 188.96 ml  Output 1960 ml  Net -1771.04 ml   Filed Weights   04/11/18 0501 04/12/18 0500 04/13/18 0306  Weight: 184 lb (83.5 kg) 182 lb (82.6 kg) 182 lb (82.6 kg)    Telemetry    Alternating atrial fibrillation/sinus rhythm; rarely seen to have atrial pacing, occasionally has ventricular  pacing especially during atrial fibrillation.  Rate is consistently well controlled while in atrial fibrillation.  Currently normal sinus rhythm- Personally Reviewed  ECG    No new tracing- Personally Reviewed  Physical Exam  Calm, comfortable lying flat GEN: No acute distress.  Enucleation left eye Neck: No JVD Cardiac: RRR, no murmurs, rubs, or gallops.  Respiratory: Clear to auscultation bilaterally. GI: Soft, nontender, non-distended  MS:  2+ soft pitting edema to the knee on the right side, 1+ ankle edema on the left; No deformity. Neuro:  Nonfocal  Psych: Normal affect   Labs    Chemistry Recent Labs  Lab 04/11/18 0436 04/12/18 0532 04/13/18 0327  NA 138 138 138  K 3.3* 4.0 3.8  CL 102 100* 103  CO2 23 25 22   GLUCOSE 146* 130* 134*  BUN 32* 28* 28*  CREATININE 1.91* 1.76* 1.72*  CALCIUM 8.6* 8.5* 8.3*  GFRNONAA 22* 24* 25*  GFRAA 26* 28* 29*  ANIONGAP 13 13 13      Hematology Recent Labs  Lab 04/07/18 0542 04/08/18 0513 04/11/18 0436  WBC 6.4 6.5 6.4  RBC 3.39* 3.37* 3.83*  HGB 9.9* 10.1* 11.2*  HCT 31.3* 32.0* 35.0*  MCV 92.3 95.0 91.4  MCH 29.2 30.0 29.2  MCHC 31.6 31.6 32.0  RDW 16.5* 16.5* 16.8*  PLT 335 404* 560*    Cardiac EnzymesNo results for input(s): TROPONINI in  the last 168 hours. No results for input(s): TROPIPOC in the last 168 hours.   BNPNo results for input(s): BNP, PROBNP in the last 168 hours.   DDimer No results for input(s): DDIMER in the last 168 hours.   Radiology    No results found.  Cardiac Studies   2D echocardiogram 04/07/2018 Study Conclusions  - Left ventricle: The cavity size was normal. There was mild concentric hypertrophy. Systolic function was mildly reduced. The estimated ejection fraction was in the range of 45% to 50%. Hypokinesis of the anteroseptal, inferior, inferoseptal, and apical myocardium. Doppler parameters are consistent with abnormal left ventricular relaxation (grade 1  diastolic dysfunction). - Aortic valve: Transvalvular velocity was within the normal range. There was no stenosis. There was no regurgitation. - Mitral valve: Transvalvular velocity was within the normal range. There was no evidence for stenosis. There was trivial regurgitation. - Left atrium: The atrium was mildly dilated. - Right ventricle: The cavity size was normal. Wall thickness was normal. Systolic function was normal. - Atrial septum: No defect or patent foramen ovale was identified by color flow Doppler. - Tricuspid valve: There was trivial regurgitation. - Pulmonary arteries: Systolic pressure was within the normal range. PA peak pressure: 28 mm Hg (S). - Pericardium, extracardiac: A small pericardial effusion was identified posterior to the heart. Features were not consistent with tamponade physiology.  Impressions:  - Compared with the echo 06/2017, wall motion abnormalities are new.    Patient Profile     84 y.o. female with paroxysmal atrial fibrillation, tachycardia-bradycardia syndrome and dual-chamber permanent pacemaker, history of carotid artery stenosis, history of resolved tachycardia related cardiomyopathy, hypertension, hyperlipidemia, diabetes mellitus type 2, with increased frequency of atrial arrhythmia in the setting of recent right hip fracture and pneumonia.  Assessment & Plan    1. AFib: Overall burden seems to be diminishing.  Rate control is good.  Will switch amiodarone to p.o.  Resume warfarin dosing. 2. CHF: Increased doses of diuretics weight is still roughly 15 pounds above admission weight (per family's report she is actually about 35 pounds above her usual weight).  On higher diuretic dose for last 48 hours, with good urine output and with steadily improving renal function.  With keep on higher dose of diuretics until she reaches a weight of 170 pounds or less. 3. Acute on CKD: improving renal parameters. 4. Abn troponin:  Estimated to be secondary to demand ischemia.  Conservative management recommended. 5, Anemia: I do not see any evidence of ongoing bleeding, even though her INR has been supratherapeutic for the last week.  Problems with anemia in May were related to hip fracture and surgery.  She is at high risk for stroke from atrial fibrillation as well as venous thromboembolic complications with immobility and recent hip fracture/surgery.  In my opinion, the benefit of anticoagulation exceeds the risk  For questions or updates, please contact [place] Please consult [email or web address] for contact info under Cardiology/STEMI.      Signed, Sanda Klein, MD  04/13/2018, 9:37 AM

## 2018-04-13 NOTE — Progress Notes (Signed)
Right lower extremity venous duplex has been completed. Negative for obvious evidence of DVT.  04/13/18 11:39 AM Carlos Levering RVT

## 2018-04-13 NOTE — Progress Notes (Signed)
[facility] PROGRESS NOTE  Kylie Sanchez UMP:[id number] DOB: November 20, 1924 DOA: 04/04/2018  PCP: Unk Pinto, MD  Brief History/Interval Summary: 84 year old female with history of CKD stage III, hypertension, hyperlipidemia, paroxysmal atrial fib, diabetes, recent right hip fracture status post repair, was discharged to skilled nursing facility.  Patient was sent from the skilled nursing facility with a fever of 103 F, lethargy, altered mental status.  Chest x-ray showed patchy infiltrates in both lung bases.  Patient was hospitalized for further management.  She also developed atrial fibrillation with RVR.  Cardiology was consulted.  Developed fluid overload as well.  Reason for Visit: Sepsis  Consultants: Cardiology  Procedures:  Transthoracic echocardiogram Study Conclusions  - Left ventricle: The cavity size was normal. There was mild   concentric hypertrophy. Systolic function was mildly reduced. The   estimated ejection fraction was in the range of 45% to 50%.   Hypokinesis of the anteroseptal, inferior, inferoseptal, and   apical myocardium. Doppler parameters are consistent with   abnormal left ventricular relaxation (grade 1 diastolic   dysfunction). - Aortic valve: Transvalvular velocity was within the normal range.   There was no stenosis. There was no regurgitation. - Mitral valve: Transvalvular velocity was within the normal range.   There was no evidence for stenosis. There was trivial   regurgitation. - Left atrium: The atrium was mildly dilated. - Right ventricle: The cavity size was normal. Wall thickness was   normal. Systolic function was normal. - Atrial septum: No defect or patent foramen ovale was identified   by color flow Doppler. - Tricuspid valve: There was trivial regurgitation. - Pulmonary arteries: Systolic pressure was within the normal   range. PA peak pressure: 28 mm Hg (S). - Pericardium, extracardiac: A small pericardial effusion was  identified posterior to the heart. Features were not consistent   with tamponade physiology.  Impressions:  - Compared with the echo 06/2017, wall motion abnormalities are new.  Lower extremity venous Doppler Negative for DVT.  Antibiotics: None  Subjective/Interval History: Patient somewhat fatigued this morning.  Denies any complaints however.  No nausea vomiting.  Her caregivers at the bedside.  ROS: Denies any headaches.  Objective:  Vital Signs  Vitals:   04/12/18 1959 04/13/18 0306 04/13/18 0424 04/13/18 0816  BP: 139/73 90/79  137/71  Pulse: 90 87  81  Resp: (!) 28 14  14   Temp: 97.7 F (36.5 C) 97.7 F (36.5 C)  97.6 F (36.4 C)  TempSrc: Oral Oral  Oral  SpO2: 98% 97% 98% 94%  Weight:  82.6 kg (182 lb)    Height:        Intake/Output Summary (Last 24 hours) at 04/13/2018 0846 Last data filed at 04/13/2018 0309 Gross per 24 hour  Intake 173.96 ml  Output 1960 ml  Net -1786.04 ml   Filed Weights   04/11/18 0501 04/12/18 0500 04/13/18 0306  Weight: 83.5 kg (184 lb) 82.6 kg (182 lb) 82.6 kg (182 lb)    General appearance: Awake alert.  In no distress. Resp: Good air entry bilaterally.  No wheezing rales or rhonchi. Cardio S1-S2 is normal regular today.  No S3-S4.  No rubs murmurs or bruit. GI: Abdomen remains soft.  Nontender nondistended.  Bowel sounds are present.  No masses organomegaly. Extremities: Right lower extremity continues to have edema.  The left lower extremity edema has improved.  Dressing over right lateral thigh.  No active drainage noted.  Normal range of motion of the left lower extremities.  Right lower extremity mobility has improved though remains restricted.   Neurologic: No focal deficits.  Lab Results:  Data Reviewed: I have personally reviewed following labs and imaging studies  CBC: Recent Labs  Lab 04/06/18 1950 04/07/18 0542 04/08/18 0513 04/11/18 0436  WBC  --  6.4 6.5 6.4  HGB 10.1* 9.9* 10.1* 11.2*  HCT 30.8*  31.3* 32.0* 35.0*  MCV  --  92.3 95.0 91.4  PLT  --  335 404* 560*    Basic Metabolic Panel: Recent Labs  Lab 04/09/18 0658 04/10/18 0812 04/11/18 0436 04/12/18 0532 04/13/18 0327  NA 137 139 138 138 138  K 4.4 4.3 3.3* 4.0 3.8  CL 106 109 102 100* 103  CO2 18* 17* 23 25 22   GLUCOSE 125* 106* 146* 130* 134*  BUN 42* 37* 32* 28* 28*  CREATININE 2.15* 2.05* 1.91* 1.76* 1.72*  CALCIUM 8.1* 8.4* 8.6* 8.5* 8.3*  MG  --  1.7  --   --   --     GFR: Estimated Creatinine Clearance: 21.7 mL/min (A) (by C-G formula based on SCr of 1.72 mg/dL (H)).   Coagulation Profile: Recent Labs  Lab 04/06/18 1950 04/08/18 0513 04/11/18 0436  INR 2.99 3.18 2.85    Cardiac Enzymes: Recent Labs  Lab 04/08/18 0513  CKTOTAL 53    CBG: Recent Labs  Lab 04/12/18 0647 04/12/18 1157 04/12/18 1640 04/12/18 2137 04/13/18 0607  GLUCAP 128* 185* 156* 131* 150*     Recent Results (from the past 240 hour(s))  Urine culture     Status: Abnormal   Collection Time: 04/04/18  9:07 PM  Result Value Ref Range Status   Specimen Description URINE, RANDOM  Final   Special Requests   Final    NONE Performed at [facility], [place] [address] [place], [place] [address]    Culture 40,000 COLONIES/mL YEAST (A)  Final   Report Status 04/06/2018 FINAL  Final  Blood Culture (routine x 2)     Status: None   Collection Time: 04/04/18  9:27 PM  Result Value Ref Range Status   Specimen Description BLOOD LEFT ARM  Final   Special Requests   Final    BOTTLES DRAWN AEROBIC AND ANAEROBIC Blood Culture adequate volume   Culture   Final    NO GROWTH 5 DAYS Performed at [facility], [place] [address], [place], [place] [address]    Report Status 04/09/2018 FINAL  Final  Blood Culture (routine x 2)     Status: None   Collection Time: 04/05/18  2:10 AM  Result Value Ref Range Status   Specimen Description BLOOD RIGHT ARM  Final   Special Requests   Final    BOTTLES DRAWN AEROBIC ONLY Blood  Culture results may not be optimal due to an inadequate volume of blood received in culture bottles   Culture   Final    NO GROWTH 5 DAYS Performed at [facility], [place] [address], [place], [place] [address]    Report Status 04/10/2018 FINAL  Final      Radiology Studies: No results found.   Medications:  Scheduled: . amoxicillin-clavulanate  500 mg Oral Q12H  . aspirin EC  81 mg Oral Daily  . atorvastatin  40 mg Oral q1800  . escitalopram  5 mg Oral Daily  . furosemide  40 mg Intravenous Q8H  . mouth rinse  15 mL Mouth Rinse BID  . metoprolol tartrate  12.5 mg Oral BID  .  polyethylene glycol  17 g Oral BID  . senna-docusate  1 tablet Oral BID   Continuous: . amiodarone 30 mg/hr (04/13/18 0655)   MLY:[id number] **OR** acetaminophen, HYDROcodone-acetaminophen, ipratropium-albuterol, nitroGLYCERIN, ondansetron **OR** ondansetron (ZOFRAN) IV, sodium phosphate  Assessment/Plan:    Healthcare associated pneumonia with sepsis Patient met sepsis criteria at the time of admission with presence of fever, leukocytosis, lactic acidosis, acute kidney injury.  Patient was placed on intravenous vancomycin and Zosyn.  Blood cultures have been negative.  Seen by speech therapy.  On dysphagia 1 diet.  Urine strep antigen and urine Legionella are negative.  Vancomycin was discontinued.  Zosyn changed over to Augmentin.  Respiratory status remains stable.  Fluid overload being managed with IV Lasix.    Atrial fibrillation Patient has been in and out of atrial fibrillation. She had initially converted to sinus rhythm but then went back into atrial fibrillation.  Cardiology continues to follow.  She remains on intravenous amiodarone.  Also on low-dose beta-blocker.  To be in sinus rhythm this morning.  Further management per cardiology.  Previously thought to be a not good candidate for anticoagulation.  However it is felt that the benefits of anticoagulation outweigh the risk so cardiology  has placed her back on warfarin.   Elevated troponin and abnormal EKG Thought to be demand ischemia due to sepsis. Echocardiogram showed diminished systolic function with wall motion abnormalities.  We will stop aspirin now that she is back on warfarin.  Patient is on a beta-blocker.  On statin.  Does not appear that she is going to need any further testing.  Acute on chronic kidney disease stage 3/hypokalemia Baseline creatinine appears to be around 0.3-1.5.  Creatinine was noted to be climbing.  Patient was given fluids.  Urine output did pick up.  Creatinine improved.  However at the same time she gained weight.  She became more edematous.  Fluids were discontinued.  Patient was started on intravenous Lasix.  She has diuresed well.  Creatinine has improved to 1.76.  Continue strict ins and outs and daily weights.  No hydronephrosis noted on CT scan.    Acute on chronic systolic CHF with diastolic dysfunction EF noted to be 45%.  Patient started on intravenous Lasix.  She has been diuresing well.  Continue IV Lasix per cardiology.  They would like for her to come down to 170 pounds.  Continue strict ins and outs and daily weights.    Acute on chronic anemia, normocytic Baseline hemoglobin 7.9-9.5.  Patient's hemoglobin dropped to 6.8 on 5/27.  She was transfused 2 units of PRBC.  No evidence of overt bleeding.  No concerning findings noted on CT abdomen pelvis.  Ferritin 221.  B12 470.  Folate 35.  Hemoglobin remains stable.  Diabetes mellitus type 2 ABGs are stable.  Continue to monitor.  History of closed fracture of the right hip Status post surgery recently.  PT continues to follow.  Acute metabolic encephalopathy Likely due to medical illness.  CT head was unremarkable.  Seems to be close to baseline now.  History of enucleation of the left eye Stable.  Constipation Improved with laxatives.  DVT Prophylaxis: SCDs    Code Status: DNR Family Communication: Discussed with the  patient and her caregiver. Disposition Plan: Regimen as outlined above.  Await continued diuresis until weight comes down to approximately 170 pounds.  Then she will need to go to skilled nursing facility for rehab.    LOS: 9 days   [employer]  [facility] Pager [phone] 04/13/2018, 8:46 AM  If 7PM-7AM, please contact night-coverage at [email or web address], password [facility]

## 2018-04-13 NOTE — Progress Notes (Signed)
Physical Therapy Treatment Patient Details Name: Kylie Sanchez MRN: [id number] DOB: 06-Dec-1924 Today's Date: 04/13/2018    History of Present Illness Kylie Sanchez is a 84 y.o. female with a known history of CKD3, HTN, HLD, PAF, DM2, recent right hip fracture s/p surgical repair (WBAT) presents to the emergency department for evaluation of AMS.  Patient was in a usual state of health until this past week when she has been lethargic.  Baseline is fully alert and oriented per caregiver report to EDP; workup revealing pneumonia, mildly elevated troponins, acute kidney injury    PT Comments    Pt with improved tolerance to activity and exercises.    Follow Up Recommendations  SNF;Supervision/Assistance - 24 hour     Equipment Recommendations  None recommended by PT    Recommendations for Other Services       Precautions / Restrictions Precautions Precautions: Fall Precaution Comments: no L eye Restrictions Weight Bearing Restrictions: Yes RLE Weight Bearing: Weight bearing as tolerated    Mobility  Bed Mobility Overal bed mobility: Needs Assistance Bed Mobility: Rolling Rolling: Max assist         General bed mobility comments: Assist to bring hips/shoulder over. Pt reaching and pulling on rail to assist  Transfers Overall transfer level: Needs assistance Equipment used: Ambulation equipment used Transfers: Sit to/from Stand Sit to Stand: +2 physical assistance;Max assist         General transfer comment: Used maxisky to go from bed to chair. From chair attempted x 2 to stand. On first attempt only able to clear hips from chair ~4 inches. On second attempt able to stand but with heavy assist to keep hips/trunk extended. Stood x 10-15 seconds  Ambulation/Gait                 Marine scientist [place] (Stroke Patients Only)       Balance Overall balance assessment: Needs assistance Sitting-balance support: Bilateral  upper extremity supported Sitting balance-Leahy Scale: Poor Sitting balance - Comments: At edge of chair with UE support   Standing balance support: Bilateral upper extremity supported Standing balance-Leahy Scale: Zero Standing balance comment: +2 max for 10-15 seconds with Stedy                            Cognition Arousal/Alertness: Awake/alert Behavior During Therapy: Restless Overall Cognitive Status: Impaired/Different from baseline Area of Impairment: Problem solving;Memory                     Memory: Decreased short-term memory Following Commands: Follows one step commands consistently Safety/Judgement: Decreased awareness of deficits   Problem Solving: Difficulty sequencing;Requires verbal cues;Requires tactile cues        Exercises General Exercises - Lower Extremity Ankle Circles/Pumps: AROM;Both;15 reps;Seated Long Arc Quad: AAROM;AROM;Both;10 reps;Seated(assistive for right) Heel Slides: AAROM;Right;10 reps;Supine Hip ABduction/ADduction: AAROM;Right;10 reps;Supine    General Comments        Pertinent Vitals/Pain Pain Assessment: Faces Faces Pain Scale: Hurts even more Pain Location: R hip with movement Pain Descriptors / Indicators: Grimacing;Guarding Pain Intervention(s): Limited activity within patient's tolerance;Monitored during session;Repositioned    Home Living                      Prior Function            PT Goals (current goals can now  be found in the care plan section) Progress towards PT goals: Progressing toward goals    Frequency    Min 2X/week      PT Plan Current plan remains appropriate    Co-evaluation              AM-PAC PT "6 Clicks" Daily Activity  Outcome Measure  Difficulty turning over in bed (including adjusting bedclothes, sheets and blankets)?: Unable Difficulty moving from lying on back to sitting on the side of the bed? : Unable Difficulty sitting down on and standing up  from a chair with arms (e.g., wheelchair, bedside commode, etc,.)?: Unable Help needed moving to and from a bed to chair (including a wheelchair)?: Total Help needed walking in hospital room?: Total Help needed climbing 3-5 steps with a railing? : Total 6 Click Score: 6    End of Session Equipment Utilized During Treatment: Gait belt Activity Tolerance: Patient limited by pain Patient left: in chair;with call bell/phone within reach Nurse Communication: Mobility status;Need for lift equipment PT Visit Diagnosis: Muscle weakness (generalized) (M62.81);Pain;Other abnormalities of gait and mobility (R26.89) Pain - Right/Left: Right Pain - part of body: Hip     Time: 2336-1224 PT Time Calculation (min) (ACUTE ONLY): 45 min  Charges:  $Therapeutic Exercise: 8-22 mins $Therapeutic Activity: 23-37 mins                    G Codes:       [facility] PT [place] 04/13/2018, 11:49 AM

## 2018-04-13 NOTE — Progress Notes (Signed)
[place] for warfarin Indication: atrial fibrillation  Allergies  Allergen Reactions  . No Known Allergies     Patient Measurements: Height: 5\' 2"  (157.5 cm) Weight: 182 lb (82.6 kg) IBW/kg (Calculated) : 50.1   Vital Signs: Temp: 97.6 F (36.4 C) (06/03 0816) Temp Source: Oral (06/03 0816) BP: 133/89 (06/03 0900) Pulse Rate: 79 (06/03 0955)  Labs: Recent Labs    04/11/18 0436 04/12/18 0532 04/13/18 0327  HGB 11.2*  --   --   HCT 35.0*  --   --   PLT 560*  --   --   LABPROT 29.7*  --   --   INR 2.85  --   --   CREATININE 1.91* 1.76* 1.72*    Estimated Creatinine Clearance: 21.7 mL/min (A) (by C-G formula based on SCr of 1.72 mg/dL (H)).   Medical History: Past Medical History:  Diagnosis Date  . Cancer ([place]) 2015   left eye  . Cataract   . CKD (chronic kidney disease) stage 3, GFR 30-59 ml/min (HCC)    due to DM  . Dizziness   . Hyperlipidemia   . Hypertension   . Hypertensive cardiovascular disease   . Impaired vision   . Memory loss   . Paroxysmal atrial fibrillation (H[facility])   . Presence of permanent cardiac pacemaker   . Retinopathy   . Sick sinus syndrome ([place])   . Type II or unspecified type diabetes mellitus without mention of complication, not stated as uncontrolled     Assessment: 84 yo female with afib. She was on coumadin PTA and this has been on hold due to anemia (hg= 6.8 on 5/27; INR was 4.0 on 5/26). Plans are to resume coumadin today (coumadin has not given this admission; last dose reported 04/03/18). She has been on antibiotics and also noted on amiodarone. Anticipate lower warfarin dosing with addition of amiodarone.  -INR= 1.47, hg= 11.7  PTA coumadin dose: PTA dose 4 mg/day EXCEPT for 2 mg on MWF  Goal of Therapy:  INR 2-3 Monitor platelets by anticoagulation protocol: Yes   Plan:  -Coumadin 2mg  po today -Daily PT/INR  Hildred Laser, PharmD Clinical Pharmacist Clinical phone from  8:30-4:00 is [phone] After 4pm, please call Main [facility] [phone]) for assistance. 04/13/2018 11:44 AM

## 2018-04-14 LAB — BASIC METABOLIC PANEL
Anion gap: 13 (ref 5–15)
BUN: 29 mg/dL — AB (ref 6–20)
CHLORIDE: 98 mmol/L — AB (ref 101–111)
CO2: 27 mmol/L (ref 22–32)
CREATININE: 1.87 mg/dL — AB (ref 0.44–1.00)
Calcium: 8.2 mg/dL — ABNORMAL LOW (ref 8.9–10.3)
GFR calc Af Amer: 26 mL/min — ABNORMAL LOW (ref >60)
GFR calc non Af Amer: 23 mL/min — ABNORMAL LOW (ref >60)
GLUCOSE: 122 mg/dL — AB (ref 65–99)
Potassium: 3.2 mmol/L — ABNORMAL LOW (ref 3.5–5.1)
Sodium: 138 mmol/L (ref 135–145)

## 2018-04-14 LAB — GLUCOSE, CAPILLARY
GLUCOSE-CAPILLARY: 148 mg/dL — AB (ref 65–99)
GLUCOSE-CAPILLARY: 148 mg/dL — AB (ref 65–99)
Glucose-Capillary: 121 mg/dL — ABNORMAL HIGH (ref 65–99)
Glucose-Capillary: 140 mg/dL — ABNORMAL HIGH (ref 65–99)

## 2018-04-14 LAB — PROTIME-INR
INR: 1.44
Prothrombin Time: 17.4 seconds — ABNORMAL HIGH (ref 11.4–15.2)

## 2018-04-14 MED ORDER — ROPINIROLE HCL 1 MG PO TABS
0.5000 mg | ORAL_TABLET | Freq: Every day | ORAL | Status: DC
  Administered 2018-04-14: 0.5 mg via ORAL
  Filled 2018-04-14: qty 1

## 2018-04-14 MED ORDER — WARFARIN SODIUM 4 MG PO TABS
4.0000 mg | ORAL_TABLET | Freq: Once | ORAL | Status: AC
  Administered 2018-04-14: 4 mg via ORAL
  Filled 2018-04-14: qty 1

## 2018-04-14 MED ORDER — POTASSIUM CHLORIDE CRYS ER 20 MEQ PO TBCR
40.0000 meq | EXTENDED_RELEASE_TABLET | Freq: Once | ORAL | Status: AC
  Administered 2018-04-14: 40 meq via ORAL
  Filled 2018-04-14: qty 2

## 2018-04-14 NOTE — Progress Notes (Addendum)
[facility] PROGRESS NOTE  Kylie Sanchez ZJQ:[id number] DOB: 1925-01-16 DOA: 04/04/2018  PCP: Unk Pinto, MD  Brief History/Interval Summary: 84 year old female with history of CKD stage III, hypertension, hyperlipidemia, paroxysmal atrial fib, diabetes, recent right hip fracture status post repair, was discharged to skilled nursing facility.  Patient was sent from the skilled nursing facility with a fever of 103 F, lethargy, altered mental status.  Chest x-ray showed patchy infiltrates in both lung bases.  Patient was hospitalized for further management.  She also developed atrial fibrillation with RVR.  Cardiology was consulted.  Developed fluid overload as well.  She was placed on IV Lasix.  She is diuresing well.  Heart rate is better controlled.  Reason for Visit: Sepsis  Consultants: Cardiology  Procedures:  Transthoracic echocardiogram Study Conclusions  - Left ventricle: The cavity size was normal. There was mild   concentric hypertrophy. Systolic function was mildly reduced. The   estimated ejection fraction was in the range of 45% to 50%.   Hypokinesis of the anteroseptal, inferior, inferoseptal, and   apical myocardium. Doppler parameters are consistent with   abnormal left ventricular relaxation (grade 1 diastolic   dysfunction). - Aortic valve: Transvalvular velocity was within the normal range.   There was no stenosis. There was no regurgitation. - Mitral valve: Transvalvular velocity was within the normal range.   There was no evidence for stenosis. There was trivial   regurgitation. - Left atrium: The atrium was mildly dilated. - Right ventricle: The cavity size was normal. Wall thickness was   normal. Systolic function was normal. - Atrial septum: No defect or patent foramen ovale was identified   by color flow Doppler. - Tricuspid valve: There was trivial regurgitation. - Pulmonary arteries: Systolic pressure was within the normal   range. PA peak  pressure: 28 mm Hg (S). - Pericardium, extracardiac: A small pericardial effusion was   identified posterior to the heart. Features were not consistent   with tamponade physiology.  Impressions:  - Compared with the echo 06/2017, wall motion abnormalities are new.  Lower extremity venous Doppler Negative for DVT.  Antibiotics: On Augmentin  Subjective/Interval History: Patient about the same as yesterday.  Denies any chest pain or shortness of breath.  States that she wants to go home.    ROS: Denies any headaches.  Objective:  Vital Signs  Vitals:   04/14/18 0642 04/14/18 0700 04/14/18 0800 04/14/18 0919  BP: 139/72 131/60 (!) 133/99   Pulse: 64 88 77 90  Resp: 16 15 16    Temp:   98.5 F (36.9 C)   TempSrc:   Oral   SpO2: 91% 94% 94%   Weight:      Height:        Intake/Output Summary (Last 24 hours) at 04/14/2018 1012 Last data filed at 04/14/2018 0547 Gross per 24 hour  Intake 380 ml  Output 2050 ml  Net -1670 ml   Filed Weights   04/12/18 0500 04/13/18 0306 04/14/18 0549  Weight: 82.6 kg (182 lb) 82.6 kg (182 lb) 79.4 kg (175 lb)    General appearance: Weak alert.  In no distress.  Somewhat distracted. Resp: Good air entry bilaterally.  No wheezing rales or rhonchi. Cardio S1-S2 is irregularly irregular this morning.  No S3-S4.  No rubs murmurs or bruit. GI: Abdomen is soft.  Nontender nondistended.  Bowel sounds are present.  No masses organomegaly. Extremities: Continues to have edema bilateral lower extremities.  Dressing over the right lateral thigh.  No  active drainage.  Improved range of motion of the right lower extremity though still restricted.  Normal range of motion of the left lower extremity.  Neurologic: No focal deficits  Lab Results:  Data Reviewed: I have personally reviewed following labs and imaging studies  CBC: Recent Labs  Lab 04/08/18 0513 04/11/18 0436  WBC 6.5 6.4  HGB 10.1* 11.2*  HCT 32.0* 35.0*  MCV 95.0 91.4  PLT 404*  560*    Basic Metabolic Panel: Recent Labs  Lab 04/10/18 0812 04/11/18 0436 04/12/18 0532 04/13/18 0327 04/14/18 0351  NA 139 138 138 138 138  K 4.3 3.3* 4.0 3.8 3.2*  CL 109 102 100* 103 98*  CO2 17* 23 25 22 27   GLUCOSE 106* 146* 130* 134* 122*  BUN 37* 32* 28* 28* 29*  CREATININE 2.05* 1.91* 1.76* 1.72* 1.87*  CALCIUM 8.4* 8.6* 8.5* 8.3* 8.2*  MG 1.7  --   --   --   --     GFR: Estimated Creatinine Clearance: 19.5 mL/min (A) (by C-G formula based on SCr of 1.87 mg/dL (H)).   Coagulation Profile: Recent Labs  Lab 04/08/18 0513 04/11/18 0436 04/13/18 1032 04/14/18 0351  INR 3.18 2.85 1.47 1.44    Cardiac Enzymes: Recent Labs  Lab 04/08/18 0513  CKTOTAL 53    CBG: Recent Labs  Lab 04/13/18 0607 04/13/18 1155 04/13/18 1643 04/13/18 2113 04/14/18 0644  GLUCAP 150* 161* 161* 134* 121*     Recent Results (from the past 240 hour(s))  Urine culture     Status: Abnormal   Collection Time: 04/04/18  9:07 PM  Result Value Ref Range Status   Specimen Description URINE, RANDOM  Final   Special Requests   Final    NONE Performed at [facility], [place] [address] [place], [place] [address]    Culture 40,000 COLONIES/mL YEAST (A)  Final   Report Status 04/06/2018 FINAL  Final  Blood Culture (routine x 2)     Status: None   Collection Time: 04/04/18  9:27 PM  Result Value Ref Range Status   Specimen Description BLOOD LEFT ARM  Final   Special Requests   Final    BOTTLES DRAWN AEROBIC AND ANAEROBIC Blood Culture adequate volume   Culture   Final    NO GROWTH 5 DAYS Performed at [facility], [place] [address] [place], [place] [address]    Report Status 04/09/2018 FINAL  Final  Blood Culture (routine x 2)     Status: None   Collection Time: 04/05/18  2:10 AM  Result Value Ref Range Status   Specimen Description BLOOD RIGHT ARM  Final   Special Requests   Final    BOTTLES DRAWN AEROBIC ONLY Blood Culture results may not be optimal due to an  inadequate volume of blood received in culture bottles   Culture   Final    NO GROWTH 5 DAYS Performed at [facility], [place] [address], [place], [place] [address]    Report Status 04/10/2018 FINAL  Final      Radiology Studies: No results found.   Medications:  Scheduled: . [START ON 04/27/2018] amiodarone  200 mg Oral Daily  . amiodarone  400 mg Oral Daily  . amoxicillin-clavulanate  500 mg Oral Q12H  . atorvastatin  40 mg Oral q1800  . escitalopram  5 mg Oral Daily  . furosemide  40 mg Intravenous Q8H  . mouth rinse  15 mL Mouth Rinse BID  .  metoprolol tartrate  12.5 mg Oral BID  . polyethylene glycol  17 g Oral BID  . senna-docusate  1 tablet Oral BID  . warfarin  4 mg Oral ONCE-1800  . Warfarin - Pharmacist Dosing Inpatient   Does not apply q1800   Continuous:  KGM:[id number] **OR** acetaminophen, HYDROcodone-acetaminophen, ipratropium-albuterol, nitroGLYCERIN, ondansetron **OR** ondansetron (ZOFRAN) IV, sodium phosphate  Assessment/Plan:    Healthcare associated pneumonia with sepsis Patient met sepsis criteria at the time of admission with presence of fever, leukocytosis, lactic acidosis, acute kidney injury.  Patient was placed on intravenous vancomycin and Zosyn.  Blood cultures have been negative.  Seen by speech therapy.  On dysphagia 1 diet.  Urine strep antigen and urine Legionella are negative.  Vancomycin was discontinued.  Zosyn changed over to Augmentin.  Respiratory status remains stable.  Fluid overload being managed with IV Lasix.  Pneumonia appears to have improved.  It appears patient has completed about 9 days of treatment.  Will stop after today.  Atrial fibrillation Patient has been in and out of atrial fibrillation.  In atrial fibrillation this morning.  Cardiology following.  She has been transitioned to oral amiodarone.  Also on low-dose beta-blocker.  Heart rate is reasonably well controlled.  Patient has been placed back on warfarin by  cardiology.    Elevated troponin and abnormal EKG Thought to be demand ischemia due to sepsis. Echocardiogram showed diminished systolic function with wall motion abnormalities. Patient is on a beta-blocker.  On statin.  Does not appear that she is going to need any further testing.  Acute on chronic kidney disease stage 3/hypokalemia Baseline creatinine appears to be around 0.3-1.5.  Creatinine was noted to be climbing.  Patient was given fluids.  Urine output did pick up.  Creatinine improved.  However at the same time she gained weight.  She became more edematous.  Fluids were discontinued.  Patient was started on intravenous Lasix.  She has diuresed well.  Creatinine is 1.87 today.  Will repeat tomorrow.  She has very good urine output.  Continue strict ins and outs and daily weights.  No hydronephrosis noted on CT scan.  Replace potassium.  Acute on chronic systolic CHF with diastolic dysfunction EF noted to be 45%.  Patient started on intravenous Lasix.  She has been diuresing well.  Weight is coming down as well.  Cardiology managing.  Plan is to continue IV diuretics today and then hopefully transition to oral tomorrow and then discharge to skilled nursing facility at that time.  They would like for her to come down to 170 pounds.  She is 175 pounds today.  Continue strict ins and outs and daily weights.    Acute on chronic anemia, normocytic Baseline hemoglobin 7.9-9.5.  Patient's hemoglobin dropped to 6.8 on 5/27.  She was transfused 2 units of PRBC.  No evidence of overt bleeding.  No concerning findings noted on CT abdomen pelvis.  Ferritin 221.  B12 470.  Folate 35.  Hemoglobin remains stable.  Recheck hemoglobin since she has been started on warfarin.  Diabetes mellitus type 2 Patient was on metformin at home which has been held.  CBGs are reasonably well controlled.  Continue to monitor.  History of closed fracture of the right hip Status post surgery recently.  PT continues to  follow.  No DVT noted on Doppler study.  When she was discharged from the hospital after her surgery she was just on hydrocodone for analgesic purposes.  It appears that she may have been  started on OxyContin at the skilled nursing facility.  This has not been given to her in the hospital due to encephalopathy.  Pain is reasonably well controlled.  We will keep her off of it for now.  Acute metabolic encephalopathy Likely due to medical illness.  CT head was unremarkable.  Seems to be close to baseline now.  History of enucleation of the left eye Stable.  Constipation Improved with laxatives.  Restless leg syndrome Continue with the Requip.  DVT Prophylaxis: Warfarin. Code Status: DNR Family Communication: Cussed with the patient. Disposition Plan: Management as outlined above.  Hopefully discharge tomorrow if cleared by cardiology.      LOS: 10 days   [facility] Pager [phone] 04/14/2018, 10:12 AM  If 7PM-7AM, please contact night-coverage at [email or web address], password [facility]

## 2018-04-14 NOTE — Clinical Social Work Note (Signed)
CSW continues to follow for discharge needs. Per RNCM, patient will likely return to [facility] tomorrow. CSW notified admissions coordinator.  Dayton Scrape, [place]

## 2018-04-14 NOTE — Progress Notes (Signed)
[facility] for warfarin Indication: atrial fibrillation  Allergies  Allergen Reactions  . No Known Allergies     Patient Measurements: Height: 5\' 2"  (157.5 cm) Weight: 175 lb (79.4 kg) IBW/kg (Calculated) : 50.1   Vital Signs: Temp: 98.1 F (36.7 C) (06/03 2107) Temp Source: Oral (06/03 2107) BP: 133/99 (06/04 0800) Pulse Rate: 77 (06/04 0800)  Labs: Recent Labs    04/12/18 0532 04/13/18 0327 04/13/18 1032 04/14/18 0351  LABPROT  --   --  17.7* 17.4*  INR  --   --  1.47 1.44  CREATININE 1.76* 1.72*  --  1.87*    Estimated Creatinine Clearance: 19.5 mL/min (A) (by C-G formula based on SCr of 1.87 mg/dL (H)).   Medical History: Past Medical History:  Diagnosis Date  . Cancer ([facility]) 2015   left eye  . Cataract   . CKD (chronic kidney disease) stage 3, GFR 30-59 ml/min (HCC)    due to DM  . Dizziness   . Hyperlipidemia   . Hypertension   . Hypertensive cardiovascular disease   . Impaired vision   . Memory loss   . Paroxysmal atrial fibrillation (HCC)   . Presence of permanent cardiac pacemaker   . Retinopathy   . Sick sinus syndrome ([facility])   . Type II or unspecified type diabetes mellitus without mention of complication, not stated as uncontrolled     Assessment: 84 yo female with afib. She was on coumadin PTA and this has been on hold due to anemia (hg= 6.8 on 5/27; INR was 4.0 on 5/26). Plans are to resume coumadin today (coumadin has not given this admission; last dose reported 04/03/18). She has been on antibiotics and also noted on amiodarone. Anticipate lower warfarin dosing with addition of amiodarone.  -INR= 1.44, hg= 11.2 on 6/1  PTA coumadin dose: PTA dose 4 mg/day EXCEPT for 2 mg on MWF  Goal of Therapy:  INR 2-3 Monitor platelets by anticoagulation protocol: Yes   Plan:  -Coumadin 4mg  po today -Daily PT/INR  Hildred Laser, PharmD Clinical Pharmacist Clinical phone from 8:30-4:00 is [phone] After 4pm,  please call [facility] [phone]) for assistance. 04/14/2018 8:12 AM

## 2018-04-14 NOTE — Progress Notes (Signed)
Progress Note  Patient Name: Kylie Sanchez Date of Encounter: 04/14/2018  Primary Cardiologist: Thompson Grayer, MD   Subjective   Currently in atrial fibrillation with ventricular rate 80-90, had a period of normal sinus rhythm overnight. Heart rate consistently less than 100 bpm even when in atrial fibrillation. Fair urine output and net diuresis 1700 cc last 24 hours. Weight abruptly reduced by 7 pounds in last 24 hours, not certain is accurate, but if true she has lost 18 pounds since May 29. Renal function essentially unchanged.  She is hypokalemic and has received a single dose of 40 mEq potassium chloride supplement this morning.  Inpatient Medications    Scheduled Meds: . [START ON 04/27/2018] amiodarone  200 mg Oral Daily  . amiodarone  400 mg Oral Daily  . amoxicillin-clavulanate  500 mg Oral Q12H  . atorvastatin  40 mg Oral q1800  . escitalopram  5 mg Oral Daily  . furosemide  40 mg Intravenous Q8H  . mouth rinse  15 mL Mouth Rinse BID  . metoprolol tartrate  12.5 mg Oral BID  . polyethylene glycol  17 g Oral BID  . senna-docusate  1 tablet Oral BID  . warfarin  4 mg Oral ONCE-1800  . Warfarin - Pharmacist Dosing Inpatient   Does not apply q1800   Continuous Infusions:  PRN Meds: acetaminophen **OR** acetaminophen, HYDROcodone-acetaminophen, ipratropium-albuterol, nitroGLYCERIN, ondansetron **OR** ondansetron (ZOFRAN) IV, sodium phosphate   Vital Signs    Vitals:   04/14/18 0549 04/14/18 0642 04/14/18 0700 04/14/18 0800  BP:  139/72 131/60 (!) 133/99  Pulse:  64 88 77  Resp:  16 15 16   Temp:    98.5 F (36.9 C)  TempSrc:    Oral  SpO2:  91% 94% 94%  Weight: 175 lb (79.4 kg)     Height:        Intake/Output Summary (Last 24 hours) at 04/14/2018 0859 Last data filed at 04/14/2018 0547 Gross per 24 hour  Intake 380 ml  Output 2050 ml  Net -1670 ml   Filed Weights   04/12/18 0500 04/13/18 0306 04/14/18 0549  Weight: 182 lb (82.6 kg) 182 lb (82.6 kg) 175 lb  (79.4 kg)    Telemetry    Continues to be in and out of atrial fibrillation, with consistently adequate rate control- Personally Reviewed  ECG    No new tracing- Personally Reviewed  Physical Exam  Looks comfortable in bed GEN: No acute distress.  Status post enucleation left eye Neck: No JVD Cardiac:  Irregular, no murmurs, rubs, or gallops.  Respiratory: Clear to auscultation bilaterally. GI: Soft, nontender, non-distended  MS:  1+ right ankle edema, virtually no swelling left ankle; No deformity. Neuro:  Nonfocal  Psych: Normal affect   Labs    Chemistry Recent Labs  Lab 04/12/18 0532 04/13/18 0327 04/14/18 0351  NA 138 138 138  K 4.0 3.8 3.2*  CL 100* 103 98*  CO2 25 22 27   GLUCOSE 130* 134* 122*  BUN 28* 28* 29*  CREATININE 1.76* 1.72* 1.87*  CALCIUM 8.5* 8.3* 8.2*  GFRNONAA 24* 25* 23*  GFRAA 28* 29* 26*  ANIONGAP 13 13 13      Hematology Recent Labs  Lab 04/08/18 0513 04/11/18 0436  WBC 6.5 6.4  RBC 3.37* 3.83*  HGB 10.1* 11.2*  HCT 32.0* 35.0*  MCV 95.0 91.4  MCH 30.0 29.2  MCHC 31.6 32.0  RDW 16.5* 16.8*  PLT 404* 560*    Cardiac EnzymesNo results for input(s): TROPONINI  in the last 168 hours. No results for input(s): TROPIPOC in the last 168 hours.   BNPNo results for input(s): BNP, PROBNP in the last 168 hours.   DDimer No results for input(s): DDIMER in the last 168 hours.   Radiology    No results found.  Cardiac Studies   2D echocardiogram 04/07/2018 Study Conclusions  - Left ventricle: The cavity size was normal. There was mild concentric hypertrophy. Systolic function was mildly reduced. The estimated ejection fraction was in the range of 45% to 50%. Hypokinesis of the anteroseptal, inferior, inferoseptal, and apical myocardium. Doppler parameters are consistent with abnormal left ventricular relaxation (grade 1 diastolic dysfunction). - Aortic valve: Transvalvular velocity was within the normal  range. There was no stenosis. There was no regurgitation. - Mitral valve: Transvalvular velocity was within the normal range. There was no evidence for stenosis. There was trivial regurgitation. - Left atrium: The atrium was mildly dilated. - Right ventricle: The cavity size was normal. Wall thickness was normal. Systolic function was normal. - Atrial septum: No defect or patent foramen ovale was identified by color flow Doppler. - Tricuspid valve: There was trivial regurgitation. - Pulmonary arteries: Systolic pressure was within the normal range. PA peak pressure: 28 mm Hg (S). - Pericardium, extracardiac: A small pericardial effusion was identified posterior to the heart. Features were not consistent with tamponade physiology.  Impressions:  - Compared with the echo 06/2017, wall motion abnormalities are new.    Patient Profile     84 y.o. female with paroxysmal atrial fibrillation, tachycardia-bradycardia syndrome and dual-chamber permanent pacemaker, history of carotid artery stenosis, history of resolved tachycardia related cardiomyopathy, hypertension, hyperlipidemia, diabetes mellitus type 2, with increased frequency of atrial arrhythmia in the setting of recent right hip fracture and pneumonia.  Assessment & Plan    1. AFib:  Alternates frequently between atrial fibrillation and sinus rhythm, without any evident impact on symptoms.  Rate control is good.  Will switch amiodarone to p.o.  Resumed warfarin dosing.  Target INR 2.0-2.5. 2. CHF: Increased doses of diuretics is working.  Now about 8 pounds above the previous stable reported weight from her nursing facility. Probably switch her to p.o. diuretics tomorrow and she can then be discharged to her nursing facility.  Since she will be going to a nursing facility we can request that they monitor her weight labs carefully there.  Recheck potassium level tomorrow, I suspect she will require low-dose daily  potassium supplement 3. Acute on CKD: Stable renal parameters.  Will require reevaluation of potassium level and creatinine soon after discharge from the hospital.  4. Abn troponin: Estimated to be secondary to demand ischemia.  Conservative management recommended. 5, Anemia: I do not see any evidence of ongoing bleeding, even though her INR has been supratherapeutic for all of last week.  Problems with anemia in May were related to hip fracture and surgery.  She is at high risk for stroke from atrial fibrillation as well as venous thromboembolic complications with immobility and recent hip fracture/surgery.  In my opinion, the benefit of anticoagulation exceeds the risk.  Need to recheck her hemoglobin level after discharge, at the nursing home.    For questions or updates, please contact [place] Please consult [email or web address] for contact info under Cardiology/STEMI.      Signed, Sanda Klein, MD  04/14/2018, 8:59 AM

## 2018-04-15 DIAGNOSIS — R2681 Unsteadiness on feet: Secondary | ICD-10-CM | POA: Diagnosis not present

## 2018-04-15 DIAGNOSIS — R278 Other lack of coordination: Secondary | ICD-10-CM | POA: Diagnosis not present

## 2018-04-15 DIAGNOSIS — G9341 Metabolic encephalopathy: Secondary | ICD-10-CM | POA: Diagnosis not present

## 2018-04-15 DIAGNOSIS — I5041 Acute combined systolic (congestive) and diastolic (congestive) heart failure: Secondary | ICD-10-CM | POA: Diagnosis not present

## 2018-04-15 DIAGNOSIS — Z950 Presence of cardiac pacemaker: Secondary | ICD-10-CM | POA: Diagnosis not present

## 2018-04-15 DIAGNOSIS — N179 Acute kidney failure, unspecified: Secondary | ICD-10-CM | POA: Diagnosis not present

## 2018-04-15 DIAGNOSIS — I495 Sick sinus syndrome: Secondary | ICD-10-CM | POA: Diagnosis not present

## 2018-04-15 DIAGNOSIS — I2510 Atherosclerotic heart disease of native coronary artery without angina pectoris: Secondary | ICD-10-CM | POA: Diagnosis not present

## 2018-04-15 DIAGNOSIS — M6281 Muscle weakness (generalized): Secondary | ICD-10-CM | POA: Diagnosis not present

## 2018-04-15 DIAGNOSIS — S72144D Nondisplaced intertrochanteric fracture of right femur, subsequent encounter for closed fracture with routine healing: Secondary | ICD-10-CM | POA: Diagnosis not present

## 2018-04-15 DIAGNOSIS — E440 Moderate protein-calorie malnutrition: Secondary | ICD-10-CM | POA: Diagnosis not present

## 2018-04-15 DIAGNOSIS — A419 Sepsis, unspecified organism: Secondary | ICD-10-CM | POA: Diagnosis not present

## 2018-04-15 DIAGNOSIS — D6489 Other specified anemias: Secondary | ICD-10-CM | POA: Diagnosis not present

## 2018-04-15 DIAGNOSIS — M2550 Pain in unspecified joint: Secondary | ICD-10-CM | POA: Diagnosis not present

## 2018-04-15 DIAGNOSIS — R9431 Abnormal electrocardiogram [ECG] [EKG]: Secondary | ICD-10-CM | POA: Diagnosis not present

## 2018-04-15 DIAGNOSIS — M79604 Pain in right leg: Secondary | ICD-10-CM | POA: Diagnosis not present

## 2018-04-15 DIAGNOSIS — L89150 Pressure ulcer of sacral region, unstageable: Secondary | ICD-10-CM | POA: Diagnosis not present

## 2018-04-15 DIAGNOSIS — S72001D Fracture of unspecified part of neck of right femur, subsequent encounter for closed fracture with routine healing: Secondary | ICD-10-CM | POA: Diagnosis not present

## 2018-04-15 DIAGNOSIS — I6521 Occlusion and stenosis of right carotid artery: Secondary | ICD-10-CM | POA: Diagnosis not present

## 2018-04-15 DIAGNOSIS — E038 Other specified hypothyroidism: Secondary | ICD-10-CM | POA: Diagnosis not present

## 2018-04-15 DIAGNOSIS — S7221XA Displaced subtrochanteric fracture of right femur, initial encounter for closed fracture: Secondary | ICD-10-CM | POA: Diagnosis not present

## 2018-04-15 DIAGNOSIS — J189 Pneumonia, unspecified organism: Secondary | ICD-10-CM | POA: Diagnosis not present

## 2018-04-15 DIAGNOSIS — I5032 Chronic diastolic (congestive) heart failure: Secondary | ICD-10-CM | POA: Diagnosis not present

## 2018-04-15 DIAGNOSIS — E1151 Type 2 diabetes mellitus with diabetic peripheral angiopathy without gangrene: Secondary | ICD-10-CM | POA: Diagnosis not present

## 2018-04-15 DIAGNOSIS — L89610 Pressure ulcer of right heel, unstageable: Secondary | ICD-10-CM | POA: Diagnosis not present

## 2018-04-15 DIAGNOSIS — I480 Paroxysmal atrial fibrillation: Secondary | ICD-10-CM | POA: Diagnosis not present

## 2018-04-15 DIAGNOSIS — Z7401 Bed confinement status: Secondary | ICD-10-CM | POA: Diagnosis not present

## 2018-04-15 LAB — PROTIME-INR
INR: 1.5
PROTHROMBIN TIME: 18 s — AB (ref 11.4–15.2)

## 2018-04-15 LAB — CBC
HEMATOCRIT: 36.7 % (ref 36.0–46.0)
Hemoglobin: 11.7 g/dL — ABNORMAL LOW (ref 12.0–15.0)
MCH: 29.7 pg (ref 26.0–34.0)
MCHC: 31.9 g/dL (ref 30.0–36.0)
MCV: 93.1 fL (ref 78.0–100.0)
Platelets: 434 10*3/uL — ABNORMAL HIGH (ref 150–400)
RBC: 3.94 MIL/uL (ref 3.87–5.11)
RDW: 16.8 % — ABNORMAL HIGH (ref 11.5–15.5)
WBC: 6.5 10*3/uL (ref 4.0–10.5)

## 2018-04-15 LAB — BASIC METABOLIC PANEL
Anion gap: 12 (ref 5–15)
BUN: 26 mg/dL — AB (ref 6–20)
CO2: 33 mmol/L — ABNORMAL HIGH (ref 22–32)
Calcium: 8.5 mg/dL — ABNORMAL LOW (ref 8.9–10.3)
Chloride: 96 mmol/L — ABNORMAL LOW (ref 101–111)
Creatinine, Ser: 1.86 mg/dL — ABNORMAL HIGH (ref 0.44–1.00)
GFR calc Af Amer: 26 mL/min — ABNORMAL LOW (ref >60)
GFR, EST NON AFRICAN AMERICAN: 23 mL/min — AB (ref >60)
GLUCOSE: 149 mg/dL — AB (ref 65–99)
POTASSIUM: 3.4 mmol/L — AB (ref 3.5–5.1)
Sodium: 141 mmol/L (ref 135–145)

## 2018-04-15 LAB — GLUCOSE, CAPILLARY
GLUCOSE-CAPILLARY: 194 mg/dL — AB (ref 65–99)
Glucose-Capillary: 142 mg/dL — ABNORMAL HIGH (ref 65–99)

## 2018-04-15 MED ORDER — POTASSIUM CHLORIDE CRYS ER 10 MEQ PO TBCR: 10.0000 meq | EXTENDED_RELEASE_TABLET | Freq: Every day | ORAL | Status: DC

## 2018-04-15 MED ORDER — METOPROLOL TARTRATE 25 MG PO TABS: 12.5000 mg | ORAL_TABLET | Freq: Two times a day (BID) | ORAL | Status: AC

## 2018-04-15 MED ORDER — FUROSEMIDE 20 MG PO TABS
20.0000 mg | ORAL_TABLET | Freq: Every day | ORAL | Status: DC
  Administered 2018-04-15: 20 mg via ORAL
  Filled 2018-04-15: qty 1

## 2018-04-15 MED ORDER — OXYMETAZOLINE HCL 0.05 % NA SOLN
3.0000 | Freq: Once | NASAL | Status: AC
  Administered 2018-04-15: 3 via NASAL
  Filled 2018-04-15: qty 15

## 2018-04-15 MED ORDER — ESCITALOPRAM OXALATE 5 MG PO TABS: 5.0000 mg | ORAL_TABLET | Freq: Every day | ORAL | Status: AC

## 2018-04-15 MED ORDER — AMIODARONE HCL 200 MG PO TABS: ORAL_TABLET | ORAL | Status: AC

## 2018-04-15 MED ORDER — HYDROCODONE-ACETAMINOPHEN 5-325 MG PO TABS: 1.0000 | ORAL_TABLET | ORAL | 0 refills | Status: AC | PRN

## 2018-04-15 MED ORDER — WARFARIN SODIUM 4 MG PO TABS: 4.0000 mg | ORAL_TABLET | Freq: Once | ORAL | Status: DC

## 2018-04-15 MED ORDER — POTASSIUM CHLORIDE CRYS ER 20 MEQ PO TBCR: 20.0000 meq | EXTENDED_RELEASE_TABLET | Freq: Every day | ORAL | Status: DC

## 2018-04-15 MED ORDER — POTASSIUM CHLORIDE CRYS ER 20 MEQ PO TBCR
40.0000 meq | EXTENDED_RELEASE_TABLET | Freq: Once | ORAL | Status: AC
  Administered 2018-04-15: 40 meq via ORAL
  Filled 2018-04-15: qty 2

## 2018-04-15 MED ORDER — IPRATROPIUM-ALBUTEROL 0.5-2.5 (3) MG/3ML IN SOLN: 3.0000 mL | Freq: Four times a day (QID) | RESPIRATORY_TRACT | Status: AC | PRN

## 2018-04-15 MED ORDER — POTASSIUM CHLORIDE CRYS ER 10 MEQ PO TBCR: 10.0000 meq | EXTENDED_RELEASE_TABLET | Freq: Every day | ORAL | Status: AC

## 2018-04-15 NOTE — Progress Notes (Signed)
ANTICOAGULATION CONSULT NOTE - Follow Up Consult  Pharmacy Consult for Coumadin Indication: atrial fibrillation  Allergies  Allergen Reactions  . No Known Allergies     Patient Measurements: Height: 5\' 2"  (157.5 cm) Weight: 164 lb (74.4 kg) IBW/kg (Calculated) : 50.1  Vital Signs: Temp: 98.8 F (37.1 C) (06/05 0445) Temp Source: Oral (06/05 0445) BP: 130/66 (06/05 0500) Pulse Rate: 78 (06/05 0500)  Labs: Recent Labs    04/13/18 0327 04/13/18 1032 04/14/18 0351 04/15/18 0652  HGB  --   --   --  11.7*  HCT  --   --   --  36.7  PLT  --   --   --  434*  LABPROT  --  17.7* 17.4* 18.0*  INR  --  1.47 1.44 1.50  CREATININE 1.72*  --  1.87* 1.86*    Estimated Creatinine Clearance: 19 mL/min (A) (by C-G formula based on SCr of 1.86 mg/dL (H)).  Assessment:  Anticoag: Warfarin PTA for hx Afib. Held on admit with elevated INR and eval of low Hgb. Admit INR 4. Decision was to stop coumadin but as of 6/3 plan is to resume.  She has been on antibiotics and also noted on amiodarone. Anticipate lower warfarin dosing with addition of amiodarone.  -INR= 1.5, Hgb 11.7 up. Plts 434 ok. Nose bleed noted. * PTA dose 4 mg/day EXCEPT for 2 mg on MWF with admit INR 4.  Goal of Therapy:  INR 2-3 Monitor platelets by anticoagulation protocol: Yes   Plan:  Coumadin 4mg  po x 1 tonight Daily INR Ultimately maintenance dose will likely need adjusting down from PTA   Kylie Sanchez Kylie Sanchez, PharmD, BCPS Clinical Staff Pharmacist Pager [phone]  Kylie Sanchez 04/15/2018,10:33 AM

## 2018-04-15 NOTE — Progress Notes (Signed)
Clinical Social Worker facilitated patient discharge including contacting patient family and facility to confirm patient discharge plans.  Clinical information faxed to facility and family agreeable with plan.  CSW arranged ambulance transport via [facility] to [facility] .  RN to call [phone] (pt will go in [place]) for report prior to discharge.  Clinical Social Worker will sign off for now as social work intervention is no longer needed. Please consult [place] again if new need arises.  Rhea Pink, MSW, [place]

## 2018-04-15 NOTE — Discharge Summary (Signed)
Physician Discharge Summary   Patient ID: Kinzley Savell MRN: [id number] DOB/AGE: 1925/01/01 84 y.o.  Admit date: 04/04/2018 Discharge date: 04/15/2018  Primary Care Physician:  Unk Pinto, MD   Recommendations for Outpatient Follow-up:  1. Follow up with PCP in 1-2 weeks 2. Please continue daily weights for at least the first week or 2, follow BMET, CBC weekly.  Target INR 2-2.5, follow closely as patient is started on amiodarone.  Home Health: None  Equipment/Devices: None  Discharge Condition: stable CODE STATUS: DNR   Diet recommendation: Dysphagia 1 diet with thin liquids, okay to have chocolate pudding   Discharge Diagnoses:    . Sepsis due to HCAP pneumonia ([facility]) . Afib ([place]) with RVR, history of persistent A. fib . Closed fracture of right hip ([place]) . DM neuropathy, type II diabetes mellitus ([place]) . Acute on chronic systolic CHF with diastolic dysfunction . Elevated troponin . Acute metabolic encephalopathy Acute on chronic kidney disease stage III   Hypokalemia History of closed fracture of the right hip History of enucleation of the left eye Constipation Restless leg syndrome Acute on chronic anemia, normocytic  Consults: Cardiology    Allergies:   Allergies  Allergen Reactions  . No Known Allergies      DISCHARGE MEDICATIONS: Allergies as of 04/15/2018      Reactions   No Known Allergies       Medication List    STOP taking these medications   aspirin 81 MG EC tablet   glimepiride 2 MG tablet Commonly known as:  AMARYL   metFORMIN 500 MG 24 hr tablet Commonly known as:  GLUCOPHAGE-XR   metoprolol succinate 100 MG 24 hr tablet Commonly known as:  TOPROL-XL   oxyCODONE 10 mg 12 hr tablet Commonly known as:  OXYCONTIN     TAKE these medications   amiodarone 200 MG tablet Commonly known as:  PACERONE Take amiodarone 2 tabs (400 mg) daily until 04/26/2018.  Then start amiodarone 200 mg daily on 04/27/2018.   atorvastatin 40 MG  tablet Commonly known as:  LIPITOR Take 1 tablet (40 mg total) by mouth daily at 6 PM.   b complex vitamins tablet Take 1 tablet by mouth daily.   blood glucose meter kit and supplies Test sugars once daily. Dispense based insurance preference. E11.9   cholecalciferol 1000 units tablet Commonly known as:  VITAMIN D Take 1,000 Units by mouth every evening.   Co Q 10 100 MG Caps Take 100 mg by mouth every evening.   escitalopram 5 MG tablet Commonly known as:  LEXAPRO Take 1 tablet (5 mg total) by mouth daily. What changed:    medication strength  how much to take  how to take this  when to take this  additional instructions   furosemide 20 MG tablet Commonly known as:  LASIX Take 1 tablet daily for BP & Fluid What changed:    how much to take  how to take this  when to take this  additional instructions   gabapentin 300 MG capsule Commonly known as:  NEURONTIN Take 300 mg by mouth at bedtime.   glucose blood test strip Check sugars once to twice a day for diabetes E11.21   HYDROcodone-acetaminophen 5-325 MG tablet Commonly known as:  NORCO/VICODIN Take 1-2 tablets by mouth every 4 (four) hours as needed for up to 3 days for moderate pain or severe pain (pain score 4-6 give 1 tablet; pain score 7-10 give 2 tablets).   ipratropium-albuterol 0.5-2.5 (3) MG/3ML Soln  Commonly known as:  DUONEB Take 3 mLs by nebulization every 6 (six) hours as needed.   metoprolol tartrate 25 MG tablet Commonly known as:  LOPRESSOR Take 0.5 tablets (12.5 mg total) by mouth 2 (two) times daily.   nitroGLYCERIN 0.4 MG SL tablet Commonly known as:  NITROSTAT Place 1 tablet (0.4 mg total) under the tongue every 5 (five) minutes as needed for chest pain.   polyethylene glycol packet Commonly known as:  MIRALAX / GLYCOLAX Take 17 g by mouth 2 (two) times daily.   potassium chloride 10 MEQ tablet Commonly known as:  K-DUR,KLOR-CON Take 1 tablet (10 mEq total) by mouth  daily. Start taking on:  04/16/2018   PRESCRIPTION MEDICATION Take 8 oz by mouth 2 (two) times daily. MED PASS   rOPINIRole 0.5 MG tablet Commonly known as:  REQUIP Take 0.5 mg by mouth at bedtime.   sennosides-docusate sodium 8.6-50 MG tablet Commonly known as:  SENOKOT-S Take 1 tablet by mouth daily.   warfarin 4 MG tablet Commonly known as:  COUMADIN Take as directed. If you are unsure how to take this medication, talk to your nurse or doctor. Original instructions:  Take as directed by [facility] What changed:    how much to take  how to take this  when to take this  additional instructions        Brief H and P: For complete details please refer to admission H and P, but in brief 84 year old female with history of CKD stage III, hypertension, hyperlipidemia, paroxysmal atrial fib, diabetes, recent right hip fracture status post repair, was discharged to skilled nursing facility. Patient was sent from the skilled nursing facility with a fever of 103 F, lethargy, altered mental status.  Chest x-ray showed patchy infiltrates in both lung bases.  Patient was hospitalized for further management.  She also developed atrial fibrillation with RVR.  Cardiology was consulted.  Developed fluid overload as well.    Hospital Course:  Sepsis with healthcare associated pneumonia -Met sepsis criteria at the time of admission with fever, leukocytosis, lactic acidosis, acute kidney injury. Placed on IV vancomycin and Zosyn, blood cultures remain negative-, urine strep antigen, urine Legionella antigen negative. -Patient was seen by speech therapy, recommended dysphagia 1 diet.  Vancomycin was discontinued and Zosyn was changed over to Augmentin. Patient has completed-antibiotics course  Persistent atrial fibrillation with RVR -Patient had been in and out of atrial fibrillation, cardiology was consulted and patient was transitioned to oral amiodarone and added low-dose of  beta-blocker. -She is restarted on warfarin.  Follow INR closely as patient is started on amiodarone which can affect INR, goal 2-2.5  Elevated troponin, abnormal EKG -Likely due to demand ischemia, sepsis.  Echo showed diminished systolic function with wall motion abnormalities. -Continue beta-blocker, statin, outpatient follow-up with cardiology  Acute on chronic kidney disease stage III, hypokalemia Baseline creatinine 0.3-1.5, peaked at 2.34, patient was on IV Lasix for diuresis Currently improving, 1.86 CT scan showed no hydronephrosis.  Acute on chronic systolic CHF with diastolic dysfunction EF 19%, patient was placed on IV Lasix for diuresis, has been diuresing well. Cardiology cleared patient for discharge and recommended Lasix oral 20 mg daily Follow daily weights for 1 to 2 weeks, follow BMET CBC closely Weight down from 193lbs-> 164lbs today at discharge   Acute on chronic anemia, normocytic Baseline hemoglobin 7.9-9.5, hemoglobin dropped to 6.8 on 5/27, was transfused 2 units packed RBCs.  No evidence of overt bleeding CT abdomen pelvis negative, H&H  remained stable, follow CBC closely since patient is started on warfarin Hemoglobin 11.7 at the time of discharge.  Diabetes mellitus type 2  patient was on metformin at home which was held.  CBGs remained reasonably well controlled, continue to monitor, restart metformin if CBGs are elevated  History of closed fracture of right hip Status post recent surgery, continue physical therapy.  No DVT noted on Doppler study. When she was discharged from the hospital after her surgery, she was just on hydrocodone for analgesic purposes.  She may have been started on OxyContin at the skilled nursing facility which was discontinued due to encephalopathy.  Pain is controlled, avoid OxyContin. Continue PT  Acute metabolic encephalopathy CT head unremarkable, currently close to baseline  History of enucleation of the left  eye Stable.  Constipation Improved with laxatives.  Restless leg syndrome Continue with the Requip.    Day of Discharge S: No complaints, feeling better.  Continues to have excellent urine output.  Good heart rate control.  BP 139/64 (BP Location: Left Arm)   Pulse 78   Temp 98.2 F (36.8 C) (Oral)   Resp (!) 25   Ht 5' 2"  (1.575 m)   Wt 74.4 kg (164 lb)   SpO2 (!) 87%   BMI 30.00 kg/m   Physical Exam: General: Alert and awake oriented x3 not in any acute distress. HEENT: left eye enucleation CVS: irregularly irregular Chest: clear to auscultation bilaterally, no wheezing rales or rhonchi Abdomen: soft nontender, nondistended, normal bowel sounds Extremities: no cyanosis, clubbing or edema noted bilaterally Neuro: no new focal neurological deficits   The results of significant diagnostics from this hospitalization (including imaging, microbiology, ancillary and laboratory) are listed below for reference.      Procedures/Studies:  Ct Abdomen Pelvis Wo Contrast  Result Date: 04/07/2018 CLINICAL DATA:  Abdominal distension EXAM: CT ABDOMEN AND PELVIS WITHOUT CONTRAST TECHNIQUE: Multidetector CT imaging of the abdomen and pelvis was performed following the standard protocol without IV contrast. COMPARISON:  None. FINDINGS: Lower chest: Bilateral lower lobe consolidation is noted with associated small pleural effusions this is slightly more progressed on the left than the right. Hepatobiliary: Gallbladder is well distended without definitive cholelithiasis. The liver is unremarkable. Pancreas: Unremarkable. No pancreatic ductal dilatation or surrounding inflammatory changes. Spleen: Normal in size without focal abnormality. Adrenals/Urinary Tract: The adrenal glands are within normal limits bilaterally. Scattered lesions are noted within both kidneys likely related to cysts. A few of these are hyperdense in nature likely related to hemorrhage. No obstructive changes are seen.  Foley catheter is noted in the bladder. Small amount of air is noted in the bladder likely related to recent instrumentation. Stomach/Bowel: Diverticulosis of the colon is seen without definitive diverticulitis. No obstructive changes are seen. The appendix is within normal limits. Large duodenal diverticulum is noted adjacent to the head of the pancreas. Vascular/Lymphatic: Aortic atherosclerosis. No enlarged abdominal or pelvic lymph nodes. Reproductive: Uterus and bilateral adnexa are unremarkable. Other: Mild ascites is noted as well as changes of anasarca within the abdominal wall. Musculoskeletal: Degenerative changes of the lumbar spine are seen. Changes of prior femoral fracture on the right are noted with fixation. There is also a fracture of the inferior pubic ramus on the right IMPRESSION: Bilateral lower lobe consolidation with associated effusions worse on the left than the right. Cystic changes within the kidneys. Mild ascites and changes of anasarca. Right femoral fracture with fixation. Inferior pubic ramus fracture is noted as well. Diverticulosis without diverticulitis. Electronically Signed  By: Inez Catalina M.D.   On: 04/07/2018 14:00   Dg Chest 1 View  Result Date: 03/22/2018 CLINICAL DATA:  Closed fracture of the right hip.  Preop. EXAM: CHEST  1 VIEW COMPARISON:  05/18/2017 FINDINGS: Heart is top-normal in size. There is aortic atherosclerosis. No definite aneurysm. Left-sided pacemaker apparatus right atrial and right ventricular leads are redemonstrated. There is scarring overlying the left costophrenic angle and to a lesser degree the right costophrenic angle. Clearing of right-sided pleural effusions since previous exam. No overt pulmonary edema or pulmonary consolidation. Intact bony thorax. 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 IMPRESSION: No pneumonia.  Bibasilar scarring.  Aortic atherosclerosis. Electronically Signed   By: Ashley Royalty M.D.   On: 03/22/2018 01:42    Ct Head Wo Contrast  Result Date: 04/05/2018 CLINICAL DATA:  Altered mental status. EXAM: CT HEAD WITHOUT CONTRAST TECHNIQUE: Contiguous axial images were obtained from the base of the skull through the vertex without intravenous contrast. COMPARISON:  03/22/2018 FINDINGS: Brain: No evidence of acute infarction, hemorrhage, hydrocephalus, extra-axial collection or mass lesion/mass effect. There is ventricular and sulcal enlargement reflecting generalized atrophy stable from the prior study. Patchy areas of white matter hypoattenuation are also noted consistent with mild chronic microvascular ischemic change. Vascular: No hyperdense vessel or unexpected calcification. Skull: Status post left orbital exenteration, stable from prior CT. No skull fracture or suspicious lesion. Sinuses/Orbits: Visualize right globe and orbit unremarkable. No acute findings in the remaining sinuses. Clear mastoid air cells. Other: None. IMPRESSION: 1. No acute intracranial abnormalities. 2. Atrophy and chronic microvascular ischemic change, stable from prior study. 3. Status post left orbital surgery, also unchanged. Electronically Signed   By: Lajean Manes M.D.   On: 04/05/2018 12:25   Ct Head Wo Contrast  Result Date: 03/22/2018 CLINICAL DATA:  Status post fall last night. EXAM: CT HEAD WITHOUT CONTRAST TECHNIQUE: Contiguous axial images were obtained from the base of the skull through the vertex without intravenous contrast. COMPARISON:  May 13, 2017 FINDINGS: Brain: No evidence of acute infarction, hemorrhage, hydrocephalus, extra-axial collection or mass lesion/mass effect. There is chronic diffuse atrophy. Chronic bilateral periventricular white matter small vessel ischemic changes noted. Small old right basal ganglia lacunar infarction is identified unchanged. Vascular: No hyperdense vessel or unexpected calcification. 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 Sinuses/Orbits: No  acute abnormality. 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 Other: Right parietal scalp swelling and hematoma is noted. IMPRESSION: No focal acute intracranial abnormality identified. Right parietal scalp swelling hematoma is identified. Electronically Signed   By: Abelardo Diesel M.D.   On: 03/22/2018 07:53   Dg Chest Port 1 View  Result Date: 04/04/2018 CLINICAL DATA:  Fever and decreased responsiveness EXAM: PORTABLE CHEST 1 VIEW COMPARISON:  Mar 22, 2018 FINDINGS: There is patchy infiltrate in both lung bases. Heart is upper normal in size with pulmonary vascularity normal. There is aortic atherosclerosis. Pacemaker leads are attached the right atrium and right ventricle. Bones osteoporotic. No adenopathy. IMPRESSION: Patchy infiltrate felt to represent pneumonia in both lung bases. Lungs elsewhere clear. Stable cardiac silhouette. There is aortic atherosclerosis. Aortic Atherosclerosis (ICD10-I70.0). Electronically Signed   By: Lowella Grip III M.D.   On: 04/04/2018 21:30   Dg C-arm 1-60 Min  Result Date: 03/23/2018 CLINICAL DATA:  Right hip fracture EXAM: DG C-ARM 61-120 MIN; RIGHT FEMUR 2 VIEWS COMPARISON:  03/22/2018 FINDINGS: Total fluoroscopy time was 3 minutes 24 seconds. Six low resolution  intraoperative spot views of the right hip. Images demonstrate intramedullary rod and screw fixation of the right femur for intertrochanteric fracture. IMPRESSION: Intraoperative fluoroscopic assistance provided during right hip surgery Electronically Signed   By: Donavan Foil M.D.   On: 03/23/2018 19:18   Dg Hip Unilat With Pelvis 2-3 Views Left  Result Date: 03/22/2018 CLINICAL DATA:  84 year old female with fall. EXAM: DG HIP (WITH OR WITHOUT PELVIS) 2-3V LEFT; DG HIP (WITH OR WITHOUT PELVIS) 2-3V RIGHT COMPARISON:  None. FINDINGS: There is a comminuted and mildly displaced intertrochanteric fracture of the right femur with mild proximal migration of the femoral  diaphysis. There is no fracture or dislocation of the left hip. Sclerotic area involving the right inferior pubic ramus, age indeterminate, possibly chronic. Clinical correlation is recommended. There is advanced osteopenia. There is degenerative changes of the lower lumbar spine. The soft tissues are grossly unremarkable. IMPRESSION: Mildly displaced intertrochanteric fracture of the right femur. No dislocation. Electronically Signed   By: Anner Crete M.D.   On: 03/22/2018 01:39   Dg Hip Unilat With Pelvis 2-3 Views Right  Result Date: 04/04/2018 CLINICAL DATA:  Recent right hip ORIF EXAM: DG HIP (WITH OR WITHOUT PELVIS) 2-3V RIGHT COMPARISON:  03/22/2018 FINDINGS: Antegrade intramedullary nail with interlocking femoral neck screw without abnormal perihardware lucency. Fracture site is in anatomic alignment. No acute fracture. IMPRESSION: Right femoral ORIF hardware without adverse features. Electronically Signed   By: Ulyses Jarred M.D.   On: 04/04/2018 22:18   Dg Hip Unilat  With Pelvis 2-3 Views Right  Result Date: 03/22/2018 CLINICAL DATA:  84 year old female with fall. EXAM: DG HIP (WITH OR WITHOUT PELVIS) 2-3V LEFT; DG HIP (WITH OR WITHOUT PELVIS) 2-3V RIGHT COMPARISON:  None. FINDINGS: There is a comminuted and mildly displaced intertrochanteric fracture of the right femur with mild proximal migration of the femoral diaphysis. There is no fracture or dislocation of the left hip. Sclerotic area involving the right inferior pubic ramus, age indeterminate, possibly chronic. Clinical correlation is recommended. There is advanced osteopenia. There is degenerative changes of the lower lumbar spine. The soft tissues are grossly unremarkable. IMPRESSION: Mildly displaced intertrochanteric fracture of the right femur. No dislocation. Electronically Signed   By: Anner Crete M.D.   On: 03/22/2018 01:39   Dg Femur, Min 2 Views Right  Result Date: 03/23/2018 CLINICAL DATA:  Right hip fracture EXAM:  DG C-ARM 61-120 MIN; RIGHT FEMUR 2 VIEWS COMPARISON:  03/22/2018 FINDINGS: Total fluoroscopy time was 3 minutes 24 seconds. Six low resolution intraoperative spot views of the right hip. Images demonstrate intramedullary rod and screw fixation of the right femur for intertrochanteric fracture. IMPRESSION: Intraoperative fluoroscopic assistance provided during right hip surgery Electronically Signed   By: Donavan Foil M.D.   On: 03/23/2018 19:18   Xr Femur, Min 2 Views Right  Result Date: 03/30/2018 2 views of the right hip show intact hardware from fixation of a complex comminuted trochanteric proximal femur fracture with significant subtrochanteric extension.  There is no evidence of hardware failure or complicating features.     LAB RESULTS: Basic Metabolic Panel: Recent Labs  Lab 04/10/18 0812  04/14/18 0351 04/15/18 0652  NA 139   < > 138 141  K 4.3   < > 3.2* 3.4*  CL 109   < > 98* 96*  CO2 17*   < > 27 33*  GLUCOSE 106*   < > 122* 149*  BUN 37*   < > 29* 26*  CREATININE 2.05*   < >  1.87* 1.86*  CALCIUM 8.4*   < > 8.2* 8.5*  MG 1.7  --   --   --    < > = values in this interval not displayed.   Liver Function Tests: No results for input(s): AST, ALT, ALKPHOS, BILITOT, PROT, ALBUMIN in the last 168 hours. No results for input(s): LIPASE, AMYLASE in the last 168 hours. No results for input(s): AMMONIA in the last 168 hours. CBC: Recent Labs  Lab 04/11/18 0436 04/15/18 0652  WBC 6.4 6.5  HGB 11.2* 11.7*  HCT 35.0* 36.7  MCV 91.4 93.1  PLT 560* 434*   Cardiac Enzymes: No results for input(s): CKTOTAL, CKMB, CKMBINDEX, TROPONINI in the last 168 hours. BNP: Invalid input(s): POCBNP CBG: Recent Labs  Lab 04/14/18 2116 04/15/18 0556  GLUCAP 140* 142*      Disposition and Follow-up: Discharge Instructions    Increase activity slowly   Complete by:  As directed        DISPOSITION: [facility] information for  follow-up providers    Unk Pinto, MD. Schedule an appointment as soon as possible for a visit in 2 week(s).   Specialty:  Internal Medicine Contact information: [address] [place] [address] [phone]        Thompson Grayer, MD. Schedule an appointment as soon as possible for a visit in 2 week(s).   Specialty:  Cardiology Contact information: [place] [address] [phone]            Contact information for after-discharge care    Destination    [facility] .   Service:  Skilled Nursing Contact information: [place] [phone]                   Time coordinating discharge:  45 minutes  Signed:   Estill Cotta M.D. [facility] 04/15/2018, 11:21 AM Pager: [phone]

## 2018-04-15 NOTE — Progress Notes (Signed)
Progress Note  Patient Name: Kylie Sanchez Date of Encounter: 04/15/2018  Primary Cardiologist: Thompson Grayer, MD   Subjective   Continues to have excellent urine output and weight is back down to her previous range as reported by [facility]. Stable renal parameters despite diuresis.  Potassium borderline low.  INR remains subtherapeutic. Telemetry shows recurrent episodes of paroxysmal atrial fibrillation with consistently good ventricular rate control. Had a brief nosebleed which she says happens several times a year.  Inpatient Medications    Scheduled Meds: . [START ON 04/27/2018] amiodarone  200 mg Oral Daily  . amiodarone  400 mg Oral Daily  . atorvastatin  40 mg Oral q1800  . escitalopram  5 mg Oral Daily  . furosemide  40 mg Intravenous Q8H  . mouth rinse  15 mL Mouth Rinse BID  . metoprolol tartrate  12.5 mg Oral BID  . polyethylene glycol  17 g Oral BID  . potassium chloride  20 mEq Oral Daily  . rOPINIRole  0.5 mg Oral QHS  . senna-docusate  1 tablet Oral BID  . Warfarin - Pharmacist Dosing Inpatient   Does not apply q1800   Continuous Infusions:  PRN Meds: acetaminophen **OR** acetaminophen, HYDROcodone-acetaminophen, ipratropium-albuterol, nitroGLYCERIN, ondansetron **OR** ondansetron (ZOFRAN) IV, sodium phosphate   Vital Signs    Vitals:   04/14/18 2003 04/15/18 0445 04/15/18 0500 04/15/18 0546  BP: 137/71 136/72 130/66   Pulse:  94 78   Resp:  20 (!) 25   Temp: 98.2 F (36.8 C) 98.8 F (37.1 C)    TempSrc: Oral Oral    SpO2:  95% (!) 87%   Weight:    164 lb (74.4 kg)  Height:        Intake/Output Summary (Last 24 hours) at 04/15/2018 1001 Last data filed at 04/15/2018 0546 Gross per 24 hour  Intake 840 ml  Output 2225 ml  Net -1385 ml   Filed Weights   04/13/18 0306 04/14/18 0549 04/15/18 0546  Weight: 182 lb (82.6 kg) 175 lb (79.4 kg) 164 lb (74.4 kg)    Telemetry    Please sinus rhythm with occasional PACs, heart rate in the 60s and 70s;  occasional episodes of paroxysmal atrial fibrillation with ventricular rate in the 80s and 90s.- Personally Reviewed  ECG    No new tracing- Personally Reviewed  Physical Exam  Appears comfortable, status post left eye enucleation GEN: No acute distress.   Neck: No JVD Cardiac:  Irregular, no murmurs, rubs, or gallops.  Respiratory: Clear to auscultation bilaterally. GI: Soft, nontender, non-distended  MS: No edema; No deformity. Neuro:  Nonfocal  Psych: Normal affect   Labs    Chemistry Recent Labs  Lab 04/13/18 0327 04/14/18 0351 04/15/18 0652  NA 138 138 141  K 3.8 3.2* 3.4*  CL 103 98* 96*  CO2 22 27 33*  GLUCOSE 134* 122* 149*  BUN 28* 29* 26*  CREATININE 1.72* 1.87* 1.86*  CALCIUM 8.3* 8.2* 8.5*  GFRNONAA 25* 23* 23*  GFRAA 29* 26* 26*  ANIONGAP 13 13 12      Hematology Recent Labs  Lab 04/11/18 0436 04/15/18 0652  WBC 6.4 6.5  RBC 3.83* 3.94  HGB 11.2* 11.7*  HCT 35.0* 36.7  MCV 91.4 93.1  MCH 29.2 29.7  MCHC 32.0 31.9  RDW 16.8* 16.8*  PLT 560* 434*    Cardiac EnzymesNo results for input(s): TROPONINI in the last 168 hours. No results for input(s): TROPIPOC in the last 168 hours.   BNPNo results  for input(s): BNP, PROBNP in the last 168 hours.   DDimer No results for input(s): DDIMER in the last 168 hours.   Radiology    No results found.  Cardiac Studies   2D echocardiogram 04/07/2018 Study Conclusions  - Left ventricle: The cavity size was normal. There was mild concentric hypertrophy. Systolic function was mildly reduced. The estimated ejection fraction was in the range of 45% to 50%. Hypokinesis of the anteroseptal, inferior, inferoseptal, and apical myocardium. Doppler parameters are consistent with abnormal left ventricular relaxation (grade 1 diastolic dysfunction). - Aortic valve: Transvalvular velocity was within the normal range. There was no stenosis. There was no regurgitation. - Mitral valve: Transvalvular  velocity was within the normal range. There was no evidence for stenosis. There was trivial regurgitation. - Left atrium: The atrium was mildly dilated. - Right ventricle: The cavity size was normal. Wall thickness was normal. Systolic function was normal. - Atrial septum: No defect or patent foramen ovale was identified by color flow Doppler. - Tricuspid valve: There was trivial regurgitation. - Pulmonary arteries: Systolic pressure was within the normal range. PA peak pressure: 28 mm Hg (S). - Pericardium, extracardiac: A small pericardial effusion was identified posterior to the heart. Features were not consistent with tamponade physiology.  Impressions:  - Compared with the echo 06/2017, wall motion abnormalities are new.  Patient Profile     84 y.o. female  with paroxysmal atrial fibrillation, tachycardia-bradycardia syndrome and dual-chamber permanent pacemaker, history of carotid artery stenosis, history of resolved tachycardia related cardiomyopathy, hypertension, hyperlipidemia, diabetes mellitus type 2, with increased frequency of atrial arrhythmia in the setting of recent right hip fracture and pneumonia.  Assessment & Plan    1. AFib: Alternates frequently between atrial fibrillation and sinus rhythm, without any evident impact on symptoms. Rate control is good. Will switch amiodarone to p.o. the dose of amiodarone should be switched to 200 mg daily on June 17, to be continued chronically. Resumed warfarin dosing.  Target INR 2.0-2.5.  Note that she will likely require a lower dose of warfarin compared to the past due to amiodarone interaction. 2. CHF: With diuresis, back to her previous reported weight from her nursing facility.   Reached back to her previous dose of oral diuretic and I think she can be monitored at the nursing facility.  Would advise daily weights at least for the first week or two.  Since she will be going to a nursing facility we can  request that they monitor her weight labs carefully there.    Start a low-dose daily potassium supplement, accounting for her renal insufficiency, recheck labs in about a week. 3. Acute on CKD: Stable renal parameters.  Will require reevaluation of potassium level and creatinine soon after discharge from the hospital.  4. Abn troponin:Estimated to be secondary to demand ischemia. Conservative management recommended. 5, Anemia:I do not see any evidence of ongoing bleeding, even though her INR has been supratherapeutic for all of last week.  In fact, her hemoglobin has been steadily increasing over the last week. Problems with anemia in May were related to hip fracture and surgery. She is at high risk for stroke from atrial fibrillation as well as venous thromboembolic complications with immobility and recent hip fracture/surgery. In my opinion, the benefit of anticoagulation exceedsthe risk.   Keep INR in the 2.0-2.5 range. Need to recheck her hemoglobin level after discharge, at the nursing home.  Ready for discharge to nursing home from cardiology perspective.   For questions or  updates, please contact [facility] Please consult [email or web address] for contact info under Cardiology/STEMI.      Signed, Sanda Klein, MD  04/15/2018, 10:01 AM

## 2018-04-15 NOTE — Progress Notes (Addendum)
Patient complaining of nasal bleed. Upon assessment bright red blood noticed coming out of patient left nostril. Patient assisted sit up in bed , lean forward and breath through her mouth. Slight pressure place on top of  Left nose to help with bleeding. Per patient this happens about every 2 months.  Tylene Fantasia, NP paged for orders.

## 2018-04-20 ENCOUNTER — Ambulatory Visit: Payer: Medicare Other | Admitting: Cardiology

## 2018-04-21 DIAGNOSIS — L89150 Pressure ulcer of sacral region, unstageable: Secondary | ICD-10-CM | POA: Diagnosis not present

## 2018-04-27 ENCOUNTER — Encounter (INDEPENDENT_AMBULATORY_CARE_PROVIDER_SITE_OTHER): Payer: Self-pay | Admitting: Physician Assistant

## 2018-04-27 ENCOUNTER — Ambulatory Visit (INDEPENDENT_AMBULATORY_CARE_PROVIDER_SITE_OTHER): Payer: Medicare Other

## 2018-04-27 ENCOUNTER — Ambulatory Visit (INDEPENDENT_AMBULATORY_CARE_PROVIDER_SITE_OTHER): Payer: No Typology Code available for payment source | Admitting: Physician Assistant

## 2018-04-27 DIAGNOSIS — S7221XA Displaced subtrochanteric fracture of right femur, initial encounter for closed fracture: Secondary | ICD-10-CM

## 2018-04-27 NOTE — Progress Notes (Signed)
Office Visit Note   Patient: Naba Sneed           Date of Birth: 01/25/1925           MRN: [id number] Visit Date: 04/27/2018              Requested by: Unk Pinto, [address] [place], [place] [address] PCP: Unk Pinto, MD   Assessment & Plan: Visit Diagnoses:  1. Closed subtrochanteric fracture, right, initial encounter ([place])     Plan: She is weightbearing as tolerated right lower extremity.  She will continue work with physical therapy on strengthening gait balance transfers.  See her back in a month check progress lack of.  Follow-Up Instructions: Return in about 1 month (around 05/25/2018) for Radiographs.   Orders:  Orders Placed This Encounter  Procedures  . XR HIP UNILAT W OR W/O PELVIS 2-3 VIEWS RIGHT   No orders of the defined types were placed in this encounter.     Procedures: No procedures performed   Clinical Data: No additional findings.   Subjective: Chief Complaint  Patient presents with  . Right Hip - Pain    HPI Mrs. Brickey returns today 35 days status post IM nailing right hip intertrochanteric fracture.  [facility].  She is been unable to bear weight.  Some concerns about her foot inverting.  She is max assistance for transfers and gait trials in parallel bars.  She presents today on a stretcher. Review of Systems   Objective: Vital Signs: There were no vitals taken for this visit.  Physical Exam General: No acute distress. Ortho Exam Right hip and lower leg surgical incisions healing well no signs of infection. Right calf supple nontender.  She has discomfort with any attempts at bending the knee and internally externally rotating the hip.  However logroll she has minimal discomfort.  Full dorsiflexion plantarflexion ankle. Specialty Comments:  No specialty comments available.  Imaging: No results found.   PMFS History: Patient Active Problem List   Diagnosis Date Noted  . AKI  (acute kidney injury) ([place])   . Acute combined systolic and diastolic CHF, NYHA class 1 ([place])   . Acute metabolic encephalopathy 90/30/0923  . Demand ischemia ([place])   . Sepsis due to pneumonia ([place]) 04/04/2018  . Acute lower UTI 03/24/2018  . Closed right hip fracture, initial encounter ([place]) 03/22/2018  . Hip fracture ([place]) 03/22/2018  . Closed fracture of right hip ([place])   . Warfarin-induced coagulopathy ([place])   . Mild cognitive impairment 01/22/2018  . Afib ([place]) 11/17/2017  . Depression, major, recurrent, in partial remission ([place]) 11/17/2017  . Palliative care encounter   . Goals of care, counseling/discussion   . Pressure injury of skin 05/22/2017  . Dilated cardiomyopathy ([place]) 05/20/2017  . Elevated troponin 05/19/2017  . Asymptomatic stenosis of right carotid artery 05/13/2017  . H/O enucleation of left eyeball 10/30/2015  . Macular degeneration, right eye 10/30/2015  . Pseudophakia, right eye 10/30/2015  . Basal cell carcinoma 02/10/2015  . DM neuropathy, type II diabetes mellitus ([place]) 02/10/2015  . Persistent atrial fibrillation ([place]) 02/03/2015  . Type 2 diabetes mellitus with hyperlipidemia ([place]) 11/24/2014  . T2_NIDDM w/Stage 3 CKD (GFR 40 ml/min) 11/08/2014  . Vitamin D deficiency 11/08/2014  . Medication management 11/08/2014  . BP (high blood pressure) 11/08/2014  . Hyperlipidemia   . Sick sinus syndrome ([place])   . Retinopathy   . Long term current use of anticoagulant therapy  04/30/2012  . Tachycardia-bradycardia ([place]) 04/19/2012  . PAF (paroxysmal atrial fibrillation) ([place]) 04/18/2011  . HCD (hypertensive cardiovascular disease) 04/18/2011   Past Medical History:  Diagnosis Date  . Cancer ([place]) 2015   left eye  . Cataract   . CKD (chronic kidney disease) stage 3, GFR 30-59 ml/min (HCC)    due to DM  . Dizziness   . Hyperlipidemia   . Hypertension   . Hypertensive cardiovascular disease   . Impaired vision   . Memory loss   . Paroxysmal atrial  fibrillation (HCC)   . Presence of permanent cardiac pacemaker   . Retinopathy   . Sick sinus syndrome ([place])   . Type II or unspecified type diabetes mellitus without mention of complication, not stated as uncontrolled     Family History  Problem Relation Age of Onset  . Heart attack Father   . Heart disease Father   . Hypertension Mother   . Heart disease Mother   . Cancer Other     Past Surgical History:  Procedure Laterality Date  . DENTAL SURGERY    . ENDARTERECTOMY Right 05/13/2017   Procedure: REDO ENDARTERECTOMY CAROTID Resection Redundant Internal Corotid Artery.;  Surgeon: Angelia Mould, MD;  Location: [place];  Service: Vascular;  Laterality: Right;  . ENDARTERECTOMY Right 05/13/2017   Procedure: ENDARTERECTOMY CAROTID-RIGHT;  Surgeon: Serafina Mitchell, MD;  Location: [facility] OR;  Service: Vascular;  Laterality: Right;  . EYE SURGERY Left    tumor and eye removed  . INTRAMEDULLARY (IM) NAIL INTERTROCHANTERIC Right 03/23/2018   Procedure: INTRAMEDULLARY (IM) NAIL RIGHT INTERTROCHANTRIC;  Surgeon: Mcarthur Rossetti, MD;  Location: [facility];  Service: Orthopedics;  Laterality: Right;  . PACEMAKER INSERTION  04/20/12   MDT Adapta L implanted by Dr Rayann Heman  . PATCH ANGIOPLASTY Right 05/13/2017   Procedure: PATCH ANGIOPLASTY USING Rueben Bash BIOLOGIC PATCH;  Surgeon: Serafina Mitchell, MD;  [facility];  Service: Vascular;  Laterality: Right;  . PERMANENT PACEMAKER INSERTION N/A 04/20/2012   Procedure: PERMANENT PACEMAKER INSERTION;  Surgeon: Thompson Grayer, MD;  [facility] LAB;  Service: Cardiovascular;  Laterality: N/A;   Social History   Occupational History  . Occupation: Retired  Tobacco Use  . Smoking status: Never Smoker  . Smokeless tobacco: Never Used  Substance and Sexual Activity  . Alcohol use: No  . Drug use: No  . Sexual activity: Not on file

## 2018-04-28 DIAGNOSIS — L89150 Pressure ulcer of sacral region, unstageable: Secondary | ICD-10-CM | POA: Diagnosis not present

## 2018-05-05 ENCOUNTER — Ambulatory Visit: Payer: Medicare Other | Admitting: Cardiology

## 2018-05-05 DIAGNOSIS — L89610 Pressure ulcer of right heel, unstageable: Secondary | ICD-10-CM | POA: Diagnosis not present

## 2018-05-05 DIAGNOSIS — L89150 Pressure ulcer of sacral region, unstageable: Secondary | ICD-10-CM | POA: Diagnosis not present

## 2018-05-06 ENCOUNTER — Encounter: Payer: Self-pay | Admitting: Internal Medicine

## 2018-05-06 ENCOUNTER — Ambulatory Visit (INDEPENDENT_AMBULATORY_CARE_PROVIDER_SITE_OTHER): Payer: Medicare Other | Admitting: Internal Medicine

## 2018-05-06 VITALS — BP 116/58 | HR 77 | Ht 62.0 in

## 2018-05-06 DIAGNOSIS — I6521 Occlusion and stenosis of right carotid artery: Secondary | ICD-10-CM | POA: Diagnosis not present

## 2018-05-06 DIAGNOSIS — Z950 Presence of cardiac pacemaker: Secondary | ICD-10-CM

## 2018-05-06 DIAGNOSIS — I495 Sick sinus syndrome: Secondary | ICD-10-CM

## 2018-05-06 DIAGNOSIS — I5041 Acute combined systolic (congestive) and diastolic (congestive) heart failure: Secondary | ICD-10-CM

## 2018-05-06 DIAGNOSIS — I480 Paroxysmal atrial fibrillation: Secondary | ICD-10-CM

## 2018-05-06 NOTE — Patient Instructions (Addendum)
Medication Instructions:  Your physician recommends that you continue on your current medications as directed. Please refer to the Current Medication list given to you today.  Labwork: None ordered.  Testing/Procedures: None ordered.  Follow-Up: Your physician wants you to follow-up in: 6 months with Chanetta Marshall, NP.   You will receive a reminder letter in the mail two months in advance. If you don't receive a letter, please call our office to schedule the follow-up appointment.  Remote monitoring is used to monitor your Pacemaker from home. This monitoring reduces the number of office visits required to check your device to one time per year. It allows [place] to keep an eye on the functioning of your device to ensure it is working properly. You are scheduled for a device check from home on 06/15/2018. You may send your transmission at any time that day. If you have a wireless device, the transmission will be sent automatically. After your physician reviews your transmission, you will receive a postcard with your next transmission date.  Any Other Special Instructions Will Be Listed Below (If Applicable).  If you need a refill on your cardiac medications before your next appointment, please call your pharmacy.

## 2018-05-06 NOTE — Progress Notes (Signed)
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 female who presents today for routine electrophysiology followup.  Since last being seen in our clinic, the patient reports doing poorly.  She fractured her hip several months ago and has been nonambulatory since.  She apparently has a fair bit of pain and is not progressing with PT.  She has also had several hospitalizations (hospital records personally reviewed).  Today, she denies symptoms of palpitations, chest pain, shortness of breath,  lower extremity edema, dizziness, presyncope, or syncope.  The patient is otherwise without complaint today.   Past Medical History:  Diagnosis Date  . Cancer ([place]) 2015   left eye  . Cataract   . CKD (chronic kidney disease) stage 3, GFR 30-59 ml/min (HCC)    due to DM  . Dizziness   . Hyperlipidemia   . Hypertension   . Hypertensive cardiovascular disease   . Impaired vision   . Memory loss   . Paroxysmal atrial fibrillation (HCC)   . Presence of permanent cardiac pacemaker   . Retinopathy   . Sick sinus syndrome ([place])   . Type II or unspecified type diabetes mellitus without mention of complication, not stated as uncontrolled    Past Surgical History:  Procedure Laterality Date  . DENTAL SURGERY    . ENDARTERECTOMY Right 05/13/2017   Procedure: REDO ENDARTERECTOMY CAROTID Resection Redundant Internal Corotid Artery.;  Surgeon: Angelia Mould, MD;  Location: [facility];  Service: Vascular;  Laterality: Right;  . ENDARTERECTOMY Right 05/13/2017   Procedure: ENDARTERECTOMY CAROTID-RIGHT;  Surgeon: Serafina Mitchell, MD;  Location[facility] OR;  Service: Vascular;  Laterality: Right;  . EYE SURGERY Left    tumor and eye removed  . INTRAMEDULLARY (IM) NAIL INTERTROCHANTERIC Right 03/23/2018   Procedure: INTRAMEDULLARY (IM) NAIL RIGHT INTERTROCHANTRIC;  Surgeon: Mcarthur Rossetti, MD;  Location: [facility];  Service: Orthopedics;  Laterality: Right;  . PACEMAKER INSERTION   04/20/12   MDT Adapta L implanted by Dr Rayann Heman  . PATCH ANGIOPLASTY Right 05/13/2017   Procedure: PATCH ANGIOPLASTY USING Rueben Bash BIOLOGIC PATCH;  Surgeon: Serafina Mitchell, MD;  Location: [facility] OR;  Service: Vascular;  Laterality: Right;  . PERMANENT PACEMAKER INSERTION N/A 04/20/2012   Procedure: PERMANENT PACEMAKER INSERTION;  Surgeon: Thompson Grayer, MD;  Location: [facility] CATH LAB;  Service: Cardiovascular;  Laterality: N/A;    ROS- all systems are reviewed and negative except as per HPI above  Current Outpatient Medications  Medication Sig Dispense Refill  . albuterol (PROVENTIL) (2.5 MG/3ML) 0.083% nebulizer solution Take 2.5 mg by nebulization every 6 (six) hours as needed for wheezing or shortness of breath.    Marland Kitchen amiodarone (PACERONE) 200 MG tablet Take amiodarone 2 tabs (400 mg) daily until 04/26/2018.  Then start amiodarone 200 mg daily on 04/27/2018.    Marland Kitchen atorvastatin (LIPITOR) 40 MG tablet Take 1 tablet (40 mg total) by mouth daily at 6 PM. 90 tablet 0  . b complex vitamins tablet Take 1 tablet by mouth daily.    . blood glucose meter kit and supplies Test sugars once daily. Dispense based insurance preference. E11.9 1 each 0  . cholecalciferol (VITAMIN D) 1000 units tablet Take 1,000 Units by mouth every evening.     . Coenzyme Q10 (CO Q 10) 100 MG CAPS Take 100 mg by mouth every evening.     . escitalopram (LEXAPRO) 5 MG tablet Take 1 tablet (5 mg total) by mouth  daily.    . furosemide (LASIX) 20 MG tablet Take 1 tablet daily for BP & Fluid (Patient taking differently: Take 20 mg by mouth daily. ) 90 tablet 0  . gabapentin (NEURONTIN) 300 MG capsule Take 300 mg by mouth at bedtime.    Marland Kitchen glucose blood test strip Check sugars once to twice a day for diabetes E11.21 150 each 4  . HYDROcodone-acetaminophen (NORCO/VICODIN) 5-325 MG tablet Take 1 tablet by mouth every 4 (four) hours as needed for moderate pain.    Marland Kitchen ipratropium-albuterol (DUONEB) 0.5-2.5 (3) MG/3ML SOLN Take 3 mLs by nebulization  every 6 (six) hours as needed. 360 mL   . metoprolol tartrate (LOPRESSOR) 25 MG tablet Take 0.5 tablets (12.5 mg total) by mouth 2 (two) times daily.    . MULTIPLE VITAMIN PO Take 1 tablet by mouth daily.    . nitroGLYCERIN (NITROSTAT) 0.4 MG SL tablet Place 1 tablet (0.4 mg total) under the tongue every 5 (five) minutes as needed for chest pain. 100 tablet 2  . polyethylene glycol (MIRALAX / GLYCOLAX) packet Take 17 g by mouth 2 (two) times daily.    . potassium chloride (K-DUR,KLOR-CON) 10 MEQ tablet Take 1 tablet (10 mEq total) by mouth daily.    Marland Kitchen PRESCRIPTION MEDICATION Take 8 oz by mouth 2 (two) times daily. MED PASS    . rOPINIRole (REQUIP) 0.5 MG tablet Take 0.5 mg by mouth at bedtime.    . sennosides-docusate sodium (SENOKOT-S) 8.6-50 MG tablet Take 1 tablet by mouth daily.    . vitamin C (ASCORBIC ACID) 500 MG tablet Take 500 mg by mouth daily.    Marland Kitchen warfarin (COUMADIN) 4 MG tablet Take as directed by [facility] (Patient taking differently: Take 2-4 mg by mouth See admin instructions. Take 2 mg by mouth every Mon, Wed, and Fri. Take 4 mg by mouth every Tues, Thurs, Sat, and Sun.) 90 tablet 1  . zinc sulfate 220 (50 Zn) MG capsule Take 220 mg by mouth daily.     No current facility-administered medications for this visit.     Physical Exam: Vitals:   05/06/18 1452 05/06/18 1454  BP:  (!) 116/58  Pulse:  77  SpO2:  92%  Height: 5' 2"  (1.575 m) 5' 2"  (1.575 m)    GEN- The patient is elderly and frail appearing, alert and oriented x 3 today.   Head- normocephalic, atraumatic Eyes-  L eye enucleated Ears- hearing intact Oropharynx- clear Lungs- Clear to ausculation bilaterally, normal work of breathing Chest- pacemaker pocket is well healed Heart- Regular rate and rhythm  GI- soft, NT, ND, + BS Extremities- no clubbing, cyanosis, +2 R leg dependant edema  Pacemaker interrogation- reviewed in detail today,  See PACEART report  ekg tracing ordered today is personally  reviewed and shows sinus rhythm 77 bpm PR 254 msec, QRS 94 msec, QTc 432 msec, nonspecific St/T changes  Assessment and Plan:  1. Symptomatic sinus bradycardia  Normal pacemaker function See Pace Art report No changes today  2. Paroxysmal atrial fibrillation Well controlled currently (3.8 % by device interrogation today--> 19.9% on device check Sept 2018) On coumadin asymptomatic Noted to have afib during recent hospitalization with controlled V rates  3. Chronic systolic dysfunction EF 35-36% Doing reasonably well euvolemic today   4. Chronic renal insufficiency Stable  5. Anemia PCP to manage Continue anticoagulated for now.  [facility]link Return to see EP PA in 6 months  Thompson Grayer MD, [facility] 05/06/2018 3:12 PM

## 2018-05-12 DIAGNOSIS — R2681 Unsteadiness on feet: Secondary | ICD-10-CM | POA: Diagnosis not present

## 2018-05-12 DIAGNOSIS — L89150 Pressure ulcer of sacral region, unstageable: Secondary | ICD-10-CM | POA: Diagnosis not present

## 2018-05-12 DIAGNOSIS — L89610 Pressure ulcer of right heel, unstageable: Secondary | ICD-10-CM | POA: Diagnosis not present

## 2018-05-12 DIAGNOSIS — I481 Persistent atrial fibrillation: Secondary | ICD-10-CM | POA: Diagnosis not present

## 2018-05-12 DIAGNOSIS — S72144D Nondisplaced intertrochanteric fracture of right femur, subsequent encounter for closed fracture with routine healing: Secondary | ICD-10-CM | POA: Diagnosis not present

## 2018-05-12 DIAGNOSIS — M6281 Muscle weakness (generalized): Secondary | ICD-10-CM | POA: Diagnosis not present

## 2018-05-12 DIAGNOSIS — M79604 Pain in right leg: Secondary | ICD-10-CM | POA: Diagnosis not present

## 2018-05-12 DIAGNOSIS — R278 Other lack of coordination: Secondary | ICD-10-CM | POA: Diagnosis not present

## 2018-05-12 DIAGNOSIS — Z4789 Encounter for other orthopedic aftercare: Secondary | ICD-10-CM | POA: Diagnosis not present

## 2018-05-13 DIAGNOSIS — R2681 Unsteadiness on feet: Secondary | ICD-10-CM | POA: Diagnosis not present

## 2018-05-13 DIAGNOSIS — R278 Other lack of coordination: Secondary | ICD-10-CM | POA: Diagnosis not present

## 2018-05-13 DIAGNOSIS — S72144D Nondisplaced intertrochanteric fracture of right femur, subsequent encounter for closed fracture with routine healing: Secondary | ICD-10-CM | POA: Diagnosis not present

## 2018-05-13 DIAGNOSIS — M79604 Pain in right leg: Secondary | ICD-10-CM | POA: Diagnosis not present

## 2018-05-13 DIAGNOSIS — I481 Persistent atrial fibrillation: Secondary | ICD-10-CM | POA: Diagnosis not present

## 2018-05-13 DIAGNOSIS — M6281 Muscle weakness (generalized): Secondary | ICD-10-CM | POA: Diagnosis not present

## 2018-05-15 DIAGNOSIS — I481 Persistent atrial fibrillation: Secondary | ICD-10-CM | POA: Diagnosis not present

## 2018-05-15 DIAGNOSIS — S72144D Nondisplaced intertrochanteric fracture of right femur, subsequent encounter for closed fracture with routine healing: Secondary | ICD-10-CM | POA: Diagnosis not present

## 2018-05-15 DIAGNOSIS — R2681 Unsteadiness on feet: Secondary | ICD-10-CM | POA: Diagnosis not present

## 2018-05-15 DIAGNOSIS — R278 Other lack of coordination: Secondary | ICD-10-CM | POA: Diagnosis not present

## 2018-05-15 DIAGNOSIS — M79604 Pain in right leg: Secondary | ICD-10-CM | POA: Diagnosis not present

## 2018-05-15 DIAGNOSIS — M6281 Muscle weakness (generalized): Secondary | ICD-10-CM | POA: Diagnosis not present

## 2018-05-18 DIAGNOSIS — R278 Other lack of coordination: Secondary | ICD-10-CM | POA: Diagnosis not present

## 2018-05-18 DIAGNOSIS — I481 Persistent atrial fibrillation: Secondary | ICD-10-CM | POA: Diagnosis not present

## 2018-05-18 DIAGNOSIS — M6281 Muscle weakness (generalized): Secondary | ICD-10-CM | POA: Diagnosis not present

## 2018-05-18 DIAGNOSIS — S72144D Nondisplaced intertrochanteric fracture of right femur, subsequent encounter for closed fracture with routine healing: Secondary | ICD-10-CM | POA: Diagnosis not present

## 2018-05-18 DIAGNOSIS — M79604 Pain in right leg: Secondary | ICD-10-CM | POA: Diagnosis not present

## 2018-05-18 DIAGNOSIS — R2681 Unsteadiness on feet: Secondary | ICD-10-CM | POA: Diagnosis not present

## 2018-05-19 DIAGNOSIS — Z7901 Long term (current) use of anticoagulants: Secondary | ICD-10-CM | POA: Diagnosis not present

## 2018-05-19 DIAGNOSIS — R278 Other lack of coordination: Secondary | ICD-10-CM | POA: Diagnosis not present

## 2018-05-19 DIAGNOSIS — I481 Persistent atrial fibrillation: Secondary | ICD-10-CM | POA: Diagnosis not present

## 2018-05-19 DIAGNOSIS — L89610 Pressure ulcer of right heel, unstageable: Secondary | ICD-10-CM | POA: Diagnosis not present

## 2018-05-19 DIAGNOSIS — R2681 Unsteadiness on feet: Secondary | ICD-10-CM | POA: Diagnosis not present

## 2018-05-19 DIAGNOSIS — S72144D Nondisplaced intertrochanteric fracture of right femur, subsequent encounter for closed fracture with routine healing: Secondary | ICD-10-CM | POA: Diagnosis not present

## 2018-05-19 DIAGNOSIS — M6281 Muscle weakness (generalized): Secondary | ICD-10-CM | POA: Diagnosis not present

## 2018-05-19 DIAGNOSIS — L89150 Pressure ulcer of sacral region, unstageable: Secondary | ICD-10-CM | POA: Diagnosis not present

## 2018-05-19 DIAGNOSIS — M79604 Pain in right leg: Secondary | ICD-10-CM | POA: Diagnosis not present

## 2018-05-20 DIAGNOSIS — M79604 Pain in right leg: Secondary | ICD-10-CM | POA: Diagnosis not present

## 2018-05-20 DIAGNOSIS — S72144D Nondisplaced intertrochanteric fracture of right femur, subsequent encounter for closed fracture with routine healing: Secondary | ICD-10-CM | POA: Diagnosis not present

## 2018-05-20 DIAGNOSIS — R2681 Unsteadiness on feet: Secondary | ICD-10-CM | POA: Diagnosis not present

## 2018-05-20 DIAGNOSIS — R278 Other lack of coordination: Secondary | ICD-10-CM | POA: Diagnosis not present

## 2018-05-20 DIAGNOSIS — M6281 Muscle weakness (generalized): Secondary | ICD-10-CM | POA: Diagnosis not present

## 2018-05-20 DIAGNOSIS — I481 Persistent atrial fibrillation: Secondary | ICD-10-CM | POA: Diagnosis not present

## 2018-05-21 DIAGNOSIS — I481 Persistent atrial fibrillation: Secondary | ICD-10-CM | POA: Diagnosis not present

## 2018-05-21 DIAGNOSIS — R0902 Hypoxemia: Secondary | ICD-10-CM | POA: Diagnosis not present

## 2018-05-21 DIAGNOSIS — S72144D Nondisplaced intertrochanteric fracture of right femur, subsequent encounter for closed fracture with routine healing: Secondary | ICD-10-CM | POA: Diagnosis not present

## 2018-05-21 DIAGNOSIS — R278 Other lack of coordination: Secondary | ICD-10-CM | POA: Diagnosis not present

## 2018-05-21 DIAGNOSIS — R2681 Unsteadiness on feet: Secondary | ICD-10-CM | POA: Diagnosis not present

## 2018-05-21 DIAGNOSIS — M79604 Pain in right leg: Secondary | ICD-10-CM | POA: Diagnosis not present

## 2018-05-21 DIAGNOSIS — M6281 Muscle weakness (generalized): Secondary | ICD-10-CM | POA: Diagnosis not present

## 2018-05-22 DIAGNOSIS — R278 Other lack of coordination: Secondary | ICD-10-CM | POA: Diagnosis not present

## 2018-05-22 DIAGNOSIS — D649 Anemia, unspecified: Secondary | ICD-10-CM | POA: Diagnosis not present

## 2018-05-22 DIAGNOSIS — R2681 Unsteadiness on feet: Secondary | ICD-10-CM | POA: Diagnosis not present

## 2018-05-22 DIAGNOSIS — M79604 Pain in right leg: Secondary | ICD-10-CM | POA: Diagnosis not present

## 2018-05-22 DIAGNOSIS — Z7901 Long term (current) use of anticoagulants: Secondary | ICD-10-CM | POA: Diagnosis not present

## 2018-05-22 DIAGNOSIS — I481 Persistent atrial fibrillation: Secondary | ICD-10-CM | POA: Diagnosis not present

## 2018-05-22 DIAGNOSIS — M6281 Muscle weakness (generalized): Secondary | ICD-10-CM | POA: Diagnosis not present

## 2018-05-22 DIAGNOSIS — S72144D Nondisplaced intertrochanteric fracture of right femur, subsequent encounter for closed fracture with routine healing: Secondary | ICD-10-CM | POA: Diagnosis not present

## 2018-05-22 DIAGNOSIS — I4891 Unspecified atrial fibrillation: Secondary | ICD-10-CM | POA: Diagnosis not present

## 2018-05-25 DIAGNOSIS — Z7901 Long term (current) use of anticoagulants: Secondary | ICD-10-CM | POA: Diagnosis not present

## 2018-05-25 DIAGNOSIS — R791 Abnormal coagulation profile: Secondary | ICD-10-CM | POA: Diagnosis not present

## 2018-05-25 DIAGNOSIS — I4891 Unspecified atrial fibrillation: Secondary | ICD-10-CM | POA: Diagnosis not present

## 2018-05-26 ENCOUNTER — Ambulatory Visit (INDEPENDENT_AMBULATORY_CARE_PROVIDER_SITE_OTHER): Payer: Medicare Other | Admitting: Orthopaedic Surgery

## 2018-05-26 DIAGNOSIS — I481 Persistent atrial fibrillation: Secondary | ICD-10-CM | POA: Diagnosis not present

## 2018-05-26 DIAGNOSIS — R2681 Unsteadiness on feet: Secondary | ICD-10-CM | POA: Diagnosis not present

## 2018-05-26 DIAGNOSIS — M79604 Pain in right leg: Secondary | ICD-10-CM | POA: Diagnosis not present

## 2018-05-26 DIAGNOSIS — M6281 Muscle weakness (generalized): Secondary | ICD-10-CM | POA: Diagnosis not present

## 2018-05-26 DIAGNOSIS — L89150 Pressure ulcer of sacral region, unstageable: Secondary | ICD-10-CM | POA: Diagnosis not present

## 2018-05-26 DIAGNOSIS — L89610 Pressure ulcer of right heel, unstageable: Secondary | ICD-10-CM | POA: Diagnosis not present

## 2018-05-26 DIAGNOSIS — R278 Other lack of coordination: Secondary | ICD-10-CM | POA: Diagnosis not present

## 2018-05-26 DIAGNOSIS — S72144D Nondisplaced intertrochanteric fracture of right femur, subsequent encounter for closed fracture with routine healing: Secondary | ICD-10-CM | POA: Diagnosis not present

## 2018-05-27 DIAGNOSIS — M6281 Muscle weakness (generalized): Secondary | ICD-10-CM | POA: Diagnosis not present

## 2018-05-27 DIAGNOSIS — R278 Other lack of coordination: Secondary | ICD-10-CM | POA: Diagnosis not present

## 2018-05-27 DIAGNOSIS — S72144D Nondisplaced intertrochanteric fracture of right femur, subsequent encounter for closed fracture with routine healing: Secondary | ICD-10-CM | POA: Diagnosis not present

## 2018-05-27 DIAGNOSIS — I481 Persistent atrial fibrillation: Secondary | ICD-10-CM | POA: Diagnosis not present

## 2018-05-27 DIAGNOSIS — M79604 Pain in right leg: Secondary | ICD-10-CM | POA: Diagnosis not present

## 2018-05-27 DIAGNOSIS — R2681 Unsteadiness on feet: Secondary | ICD-10-CM | POA: Diagnosis not present

## 2018-05-29 DIAGNOSIS — S72144D Nondisplaced intertrochanteric fracture of right femur, subsequent encounter for closed fracture with routine healing: Secondary | ICD-10-CM | POA: Diagnosis not present

## 2018-05-29 DIAGNOSIS — M6281 Muscle weakness (generalized): Secondary | ICD-10-CM | POA: Diagnosis not present

## 2018-05-29 DIAGNOSIS — I481 Persistent atrial fibrillation: Secondary | ICD-10-CM | POA: Diagnosis not present

## 2018-05-29 DIAGNOSIS — R2681 Unsteadiness on feet: Secondary | ICD-10-CM | POA: Diagnosis not present

## 2018-05-29 DIAGNOSIS — R278 Other lack of coordination: Secondary | ICD-10-CM | POA: Diagnosis not present

## 2018-05-29 DIAGNOSIS — M79604 Pain in right leg: Secondary | ICD-10-CM | POA: Diagnosis not present

## 2018-06-01 DIAGNOSIS — S72144D Nondisplaced intertrochanteric fracture of right femur, subsequent encounter for closed fracture with routine healing: Secondary | ICD-10-CM | POA: Diagnosis not present

## 2018-06-01 DIAGNOSIS — I481 Persistent atrial fibrillation: Secondary | ICD-10-CM | POA: Diagnosis not present

## 2018-06-01 DIAGNOSIS — R2681 Unsteadiness on feet: Secondary | ICD-10-CM | POA: Diagnosis not present

## 2018-06-01 DIAGNOSIS — M6281 Muscle weakness (generalized): Secondary | ICD-10-CM | POA: Diagnosis not present

## 2018-06-01 DIAGNOSIS — R278 Other lack of coordination: Secondary | ICD-10-CM | POA: Diagnosis not present

## 2018-06-01 DIAGNOSIS — M79604 Pain in right leg: Secondary | ICD-10-CM | POA: Diagnosis not present

## 2018-06-02 DIAGNOSIS — I4891 Unspecified atrial fibrillation: Secondary | ICD-10-CM | POA: Diagnosis not present

## 2018-06-02 DIAGNOSIS — L89150 Pressure ulcer of sacral region, unstageable: Secondary | ICD-10-CM | POA: Diagnosis not present

## 2018-06-02 DIAGNOSIS — Z7901 Long term (current) use of anticoagulants: Secondary | ICD-10-CM | POA: Diagnosis not present

## 2018-06-02 DIAGNOSIS — L89610 Pressure ulcer of right heel, unstageable: Secondary | ICD-10-CM | POA: Diagnosis not present

## 2018-06-03 DIAGNOSIS — R278 Other lack of coordination: Secondary | ICD-10-CM | POA: Diagnosis not present

## 2018-06-03 DIAGNOSIS — S72144D Nondisplaced intertrochanteric fracture of right femur, subsequent encounter for closed fracture with routine healing: Secondary | ICD-10-CM | POA: Diagnosis not present

## 2018-06-03 DIAGNOSIS — M79604 Pain in right leg: Secondary | ICD-10-CM | POA: Diagnosis not present

## 2018-06-03 DIAGNOSIS — M6281 Muscle weakness (generalized): Secondary | ICD-10-CM | POA: Diagnosis not present

## 2018-06-03 DIAGNOSIS — I481 Persistent atrial fibrillation: Secondary | ICD-10-CM | POA: Diagnosis not present

## 2018-06-03 DIAGNOSIS — R2681 Unsteadiness on feet: Secondary | ICD-10-CM | POA: Diagnosis not present

## 2018-06-04 DIAGNOSIS — I4891 Unspecified atrial fibrillation: Secondary | ICD-10-CM | POA: Diagnosis not present

## 2018-06-04 DIAGNOSIS — Z7901 Long term (current) use of anticoagulants: Secondary | ICD-10-CM | POA: Diagnosis not present

## 2018-06-04 DIAGNOSIS — M6281 Muscle weakness (generalized): Secondary | ICD-10-CM | POA: Diagnosis not present

## 2018-06-04 DIAGNOSIS — I481 Persistent atrial fibrillation: Secondary | ICD-10-CM | POA: Diagnosis not present

## 2018-06-04 DIAGNOSIS — M79604 Pain in right leg: Secondary | ICD-10-CM | POA: Diagnosis not present

## 2018-06-04 DIAGNOSIS — S72144D Nondisplaced intertrochanteric fracture of right femur, subsequent encounter for closed fracture with routine healing: Secondary | ICD-10-CM | POA: Diagnosis not present

## 2018-06-04 DIAGNOSIS — R278 Other lack of coordination: Secondary | ICD-10-CM | POA: Diagnosis not present

## 2018-06-04 DIAGNOSIS — R2681 Unsteadiness on feet: Secondary | ICD-10-CM | POA: Diagnosis not present

## 2018-06-08 DIAGNOSIS — I4891 Unspecified atrial fibrillation: Secondary | ICD-10-CM | POA: Diagnosis not present

## 2018-06-08 DIAGNOSIS — Z7901 Long term (current) use of anticoagulants: Secondary | ICD-10-CM | POA: Diagnosis not present

## 2018-06-09 DIAGNOSIS — L89610 Pressure ulcer of right heel, unstageable: Secondary | ICD-10-CM | POA: Diagnosis not present

## 2018-06-09 DIAGNOSIS — L89150 Pressure ulcer of sacral region, unstageable: Secondary | ICD-10-CM | POA: Diagnosis not present

## 2018-06-15 ENCOUNTER — Encounter: Payer: Medicare Other | Admitting: *Deleted

## 2018-06-16 ENCOUNTER — Ambulatory Visit (INDEPENDENT_AMBULATORY_CARE_PROVIDER_SITE_OTHER): Payer: Medicare Other | Admitting: Podiatry

## 2018-06-16 ENCOUNTER — Encounter: Payer: Self-pay | Admitting: Podiatry

## 2018-06-16 DIAGNOSIS — I6521 Occlusion and stenosis of right carotid artery: Secondary | ICD-10-CM | POA: Diagnosis not present

## 2018-06-16 DIAGNOSIS — E1142 Type 2 diabetes mellitus with diabetic polyneuropathy: Secondary | ICD-10-CM

## 2018-06-16 DIAGNOSIS — D689 Coagulation defect, unspecified: Secondary | ICD-10-CM

## 2018-06-16 DIAGNOSIS — B351 Tinea unguium: Secondary | ICD-10-CM

## 2018-06-16 DIAGNOSIS — L89610 Pressure ulcer of right heel, unstageable: Secondary | ICD-10-CM | POA: Diagnosis not present

## 2018-06-16 DIAGNOSIS — M79674 Pain in right toe(s): Secondary | ICD-10-CM | POA: Diagnosis not present

## 2018-06-16 DIAGNOSIS — M79675 Pain in left toe(s): Secondary | ICD-10-CM | POA: Diagnosis not present

## 2018-06-16 DIAGNOSIS — L89150 Pressure ulcer of sacral region, unstageable: Secondary | ICD-10-CM | POA: Diagnosis not present

## 2018-06-16 NOTE — Progress Notes (Signed)
This patient presents to the office with chief complaint of long thick nail right foot. and diabetic feet.  This patient  says there  is  no pain or  discomfort in her  feet.  This patient says there are long thick painful nail right big toe..  This nail  Is  painful walking and wearing shoes.  Patient has no history of infection or drainage from both feet.  Patient is unable to  self treat his own nails . This patient presents  to the office today for treatment of the  long nails and a foot evaluation due to history of  diabetes.  General Appearance  Alert, conversant and in no acute stress.  Vascular  Dorsalis pedis and posterior tibial  pulses are palpable  bilaterally.  Capillary return is within normal limits  bilaterally. Temperature is within normal limits  bilaterally.  Neurologic  Senn-Weinstein monofilament wire test within normal limits  bilaterally. Muscle power within normal limits bilaterally.  Nails Thick disfigured discolored nails with subungual debris  Hallux right foot. No evidence of bacterial infection or drainage bilaterally.  Orthopedic  No limitations of motion of motion feet .  No crepitus or effusions noted.  No bony pathology or digital deformities noted.  Skin  normotropic skin with no porokeratosis noted bilaterally.  No signs of infections or ulcers noted.   Asymptomatic callus sub 2 right and sub 1 left foot.  Onychomycosis  Diabetes with no foot complications  IE  Debride nails x 1.  A diabetic foot exam was performed and there is no evidence of any vascular or neurologic pathology.   RTC 4 months.   Gardiner Barefoot DPM

## 2018-06-17 ENCOUNTER — Telehealth: Payer: Self-pay

## 2018-06-17 NOTE — Telephone Encounter (Signed)
Confirmed remote transmission w/ pt niece.

## 2018-06-23 ENCOUNTER — Encounter: Payer: Self-pay | Admitting: Cardiology

## 2018-06-23 DIAGNOSIS — L89150 Pressure ulcer of sacral region, unstageable: Secondary | ICD-10-CM | POA: Diagnosis not present

## 2018-06-23 DIAGNOSIS — L89610 Pressure ulcer of right heel, unstageable: Secondary | ICD-10-CM | POA: Diagnosis not present

## 2018-06-23 DIAGNOSIS — I4891 Unspecified atrial fibrillation: Secondary | ICD-10-CM | POA: Diagnosis not present

## 2018-06-23 DIAGNOSIS — Z7901 Long term (current) use of anticoagulants: Secondary | ICD-10-CM | POA: Diagnosis not present

## 2018-06-24 DIAGNOSIS — Z7901 Long term (current) use of anticoagulants: Secondary | ICD-10-CM | POA: Diagnosis not present

## 2018-06-24 DIAGNOSIS — I4891 Unspecified atrial fibrillation: Secondary | ICD-10-CM | POA: Diagnosis not present

## 2018-06-24 DIAGNOSIS — R4182 Altered mental status, unspecified: Secondary | ICD-10-CM | POA: Diagnosis not present

## 2018-06-24 DIAGNOSIS — D649 Anemia, unspecified: Secondary | ICD-10-CM | POA: Diagnosis not present

## 2018-06-24 DIAGNOSIS — S7291XD Unspecified fracture of right femur, subsequent encounter for closed fracture with routine healing: Secondary | ICD-10-CM | POA: Diagnosis not present

## 2018-06-25 DIAGNOSIS — R1312 Dysphagia, oropharyngeal phase: Secondary | ICD-10-CM | POA: Diagnosis not present

## 2018-06-25 DIAGNOSIS — J189 Pneumonia, unspecified organism: Secondary | ICD-10-CM | POA: Diagnosis not present

## 2018-06-25 DIAGNOSIS — R41841 Cognitive communication deficit: Secondary | ICD-10-CM | POA: Diagnosis not present

## 2018-06-26 DIAGNOSIS — R41841 Cognitive communication deficit: Secondary | ICD-10-CM | POA: Diagnosis not present

## 2018-06-26 DIAGNOSIS — J189 Pneumonia, unspecified organism: Secondary | ICD-10-CM | POA: Diagnosis not present

## 2018-06-26 DIAGNOSIS — R1312 Dysphagia, oropharyngeal phase: Secondary | ICD-10-CM | POA: Diagnosis not present

## 2018-06-27 DIAGNOSIS — R41841 Cognitive communication deficit: Secondary | ICD-10-CM | POA: Diagnosis not present

## 2018-06-27 DIAGNOSIS — R1312 Dysphagia, oropharyngeal phase: Secondary | ICD-10-CM | POA: Diagnosis not present

## 2018-06-27 DIAGNOSIS — J189 Pneumonia, unspecified organism: Secondary | ICD-10-CM | POA: Diagnosis not present

## 2018-06-28 DIAGNOSIS — G309 Alzheimer's disease, unspecified: Secondary | ICD-10-CM | POA: Diagnosis not present

## 2018-06-28 DIAGNOSIS — F918 Other conduct disorders: Secondary | ICD-10-CM | POA: Diagnosis not present

## 2018-06-29 ENCOUNTER — Ambulatory Visit (INDEPENDENT_AMBULATORY_CARE_PROVIDER_SITE_OTHER): Payer: Medicare Other | Admitting: Orthopaedic Surgery

## 2018-06-29 ENCOUNTER — Ambulatory Visit (INDEPENDENT_AMBULATORY_CARE_PROVIDER_SITE_OTHER): Payer: Medicare Other

## 2018-06-29 ENCOUNTER — Encounter (INDEPENDENT_AMBULATORY_CARE_PROVIDER_SITE_OTHER): Payer: Self-pay | Admitting: Orthopaedic Surgery

## 2018-06-29 ENCOUNTER — Ambulatory Visit (INDEPENDENT_AMBULATORY_CARE_PROVIDER_SITE_OTHER): Payer: Medicare Other | Admitting: *Deleted

## 2018-06-29 DIAGNOSIS — J189 Pneumonia, unspecified organism: Secondary | ICD-10-CM | POA: Diagnosis not present

## 2018-06-29 DIAGNOSIS — S72001A Fracture of unspecified part of neck of right femur, initial encounter for closed fracture: Secondary | ICD-10-CM

## 2018-06-29 DIAGNOSIS — I495 Sick sinus syndrome: Secondary | ICD-10-CM | POA: Diagnosis not present

## 2018-06-29 DIAGNOSIS — I6521 Occlusion and stenosis of right carotid artery: Secondary | ICD-10-CM | POA: Diagnosis not present

## 2018-06-29 DIAGNOSIS — R41841 Cognitive communication deficit: Secondary | ICD-10-CM | POA: Diagnosis not present

## 2018-06-29 DIAGNOSIS — R1312 Dysphagia, oropharyngeal phase: Secondary | ICD-10-CM | POA: Diagnosis not present

## 2018-06-29 NOTE — Progress Notes (Signed)
The patient is a 84 year old nursing home resident who is now over 3 months out from surgical fixation of a displaced right hip intertrochanteric fracture.  She is mainly wheelchair bound and does not ambulate much.  Her daughter is with her today.  On exam I can easily put her operative right hip to internal and external rotation with no pain at all.  Her incisions well-healed.  X-rays confirm the fracture on the right side is healed completely and there is no evidence of hardware failure.  At this point there is nothing else that we can offer from our standpoint other than encouragement to get her moving to therapy.  All question concerns were answered and addressed.  Follow-up will be as needed.  She can continue with attempts at weightbearing as tolerated.  I think most of her deficits and limited mobility is related to cognitive deficits.

## 2018-06-30 DIAGNOSIS — Z7901 Long term (current) use of anticoagulants: Secondary | ICD-10-CM | POA: Diagnosis not present

## 2018-06-30 DIAGNOSIS — I4891 Unspecified atrial fibrillation: Secondary | ICD-10-CM | POA: Diagnosis not present

## 2018-06-30 NOTE — Progress Notes (Signed)
Remote pacemaker transmission.   

## 2018-07-01 DIAGNOSIS — R41841 Cognitive communication deficit: Secondary | ICD-10-CM | POA: Diagnosis not present

## 2018-07-01 DIAGNOSIS — J189 Pneumonia, unspecified organism: Secondary | ICD-10-CM | POA: Diagnosis not present

## 2018-07-01 DIAGNOSIS — R1312 Dysphagia, oropharyngeal phase: Secondary | ICD-10-CM | POA: Diagnosis not present

## 2018-07-02 DIAGNOSIS — J189 Pneumonia, unspecified organism: Secondary | ICD-10-CM | POA: Diagnosis not present

## 2018-07-02 DIAGNOSIS — F0391 Unspecified dementia with behavioral disturbance: Secondary | ICD-10-CM | POA: Diagnosis not present

## 2018-07-02 DIAGNOSIS — F411 Generalized anxiety disorder: Secondary | ICD-10-CM | POA: Diagnosis not present

## 2018-07-02 DIAGNOSIS — R1312 Dysphagia, oropharyngeal phase: Secondary | ICD-10-CM | POA: Diagnosis not present

## 2018-07-02 DIAGNOSIS — F4322 Adjustment disorder with anxiety: Secondary | ICD-10-CM | POA: Diagnosis not present

## 2018-07-02 DIAGNOSIS — F339 Major depressive disorder, recurrent, unspecified: Secondary | ICD-10-CM | POA: Diagnosis not present

## 2018-07-02 DIAGNOSIS — R41841 Cognitive communication deficit: Secondary | ICD-10-CM | POA: Diagnosis not present

## 2018-07-03 DIAGNOSIS — Z7901 Long term (current) use of anticoagulants: Secondary | ICD-10-CM | POA: Diagnosis not present

## 2018-07-03 DIAGNOSIS — I4891 Unspecified atrial fibrillation: Secondary | ICD-10-CM | POA: Diagnosis not present

## 2018-07-06 DIAGNOSIS — R1312 Dysphagia, oropharyngeal phase: Secondary | ICD-10-CM | POA: Diagnosis not present

## 2018-07-06 DIAGNOSIS — J189 Pneumonia, unspecified organism: Secondary | ICD-10-CM | POA: Diagnosis not present

## 2018-07-06 DIAGNOSIS — R41841 Cognitive communication deficit: Secondary | ICD-10-CM | POA: Diagnosis not present

## 2018-07-08 DIAGNOSIS — J189 Pneumonia, unspecified organism: Secondary | ICD-10-CM | POA: Diagnosis not present

## 2018-07-08 DIAGNOSIS — R41841 Cognitive communication deficit: Secondary | ICD-10-CM | POA: Diagnosis not present

## 2018-07-08 DIAGNOSIS — R1312 Dysphagia, oropharyngeal phase: Secondary | ICD-10-CM | POA: Diagnosis not present

## 2018-07-10 DIAGNOSIS — R1312 Dysphagia, oropharyngeal phase: Secondary | ICD-10-CM | POA: Diagnosis not present

## 2018-07-10 DIAGNOSIS — Z7901 Long term (current) use of anticoagulants: Secondary | ICD-10-CM | POA: Diagnosis not present

## 2018-07-10 DIAGNOSIS — I4891 Unspecified atrial fibrillation: Secondary | ICD-10-CM | POA: Diagnosis not present

## 2018-07-10 DIAGNOSIS — J189 Pneumonia, unspecified organism: Secondary | ICD-10-CM | POA: Diagnosis not present

## 2018-07-10 DIAGNOSIS — R41841 Cognitive communication deficit: Secondary | ICD-10-CM | POA: Diagnosis not present

## 2018-07-14 DIAGNOSIS — R41841 Cognitive communication deficit: Secondary | ICD-10-CM | POA: Diagnosis not present

## 2018-07-14 DIAGNOSIS — R1312 Dysphagia, oropharyngeal phase: Secondary | ICD-10-CM | POA: Diagnosis not present

## 2018-07-14 DIAGNOSIS — A419 Sepsis, unspecified organism: Secondary | ICD-10-CM | POA: Diagnosis not present

## 2018-07-14 DIAGNOSIS — R293 Abnormal posture: Secondary | ICD-10-CM | POA: Diagnosis not present

## 2018-07-14 DIAGNOSIS — L89620 Pressure ulcer of left heel, unstageable: Secondary | ICD-10-CM | POA: Diagnosis not present

## 2018-07-15 DIAGNOSIS — F0391 Unspecified dementia with behavioral disturbance: Secondary | ICD-10-CM | POA: Diagnosis not present

## 2018-07-15 DIAGNOSIS — F339 Major depressive disorder, recurrent, unspecified: Secondary | ICD-10-CM | POA: Diagnosis not present

## 2018-07-15 DIAGNOSIS — R293 Abnormal posture: Secondary | ICD-10-CM | POA: Diagnosis not present

## 2018-07-15 DIAGNOSIS — A419 Sepsis, unspecified organism: Secondary | ICD-10-CM | POA: Diagnosis not present

## 2018-07-15 DIAGNOSIS — F411 Generalized anxiety disorder: Secondary | ICD-10-CM | POA: Diagnosis not present

## 2018-07-15 DIAGNOSIS — R1312 Dysphagia, oropharyngeal phase: Secondary | ICD-10-CM | POA: Diagnosis not present

## 2018-07-15 DIAGNOSIS — L89620 Pressure ulcer of left heel, unstageable: Secondary | ICD-10-CM | POA: Diagnosis not present

## 2018-07-15 DIAGNOSIS — F4322 Adjustment disorder with anxiety: Secondary | ICD-10-CM | POA: Diagnosis not present

## 2018-07-15 DIAGNOSIS — R41841 Cognitive communication deficit: Secondary | ICD-10-CM | POA: Diagnosis not present

## 2018-07-16 DIAGNOSIS — R41841 Cognitive communication deficit: Secondary | ICD-10-CM | POA: Diagnosis not present

## 2018-07-16 DIAGNOSIS — R293 Abnormal posture: Secondary | ICD-10-CM | POA: Diagnosis not present

## 2018-07-16 DIAGNOSIS — H353114 Nonexudative age-related macular degeneration, right eye, advanced atrophic with subfoveal involvement: Secondary | ICD-10-CM | POA: Diagnosis not present

## 2018-07-16 DIAGNOSIS — A419 Sepsis, unspecified organism: Secondary | ICD-10-CM | POA: Diagnosis not present

## 2018-07-16 DIAGNOSIS — Z7984 Long term (current) use of oral hypoglycemic drugs: Secondary | ICD-10-CM | POA: Diagnosis not present

## 2018-07-16 DIAGNOSIS — L89620 Pressure ulcer of left heel, unstageable: Secondary | ICD-10-CM | POA: Diagnosis not present

## 2018-07-16 DIAGNOSIS — E119 Type 2 diabetes mellitus without complications: Secondary | ICD-10-CM | POA: Diagnosis not present

## 2018-07-16 DIAGNOSIS — Z7901 Long term (current) use of anticoagulants: Secondary | ICD-10-CM | POA: Diagnosis not present

## 2018-07-16 DIAGNOSIS — R1312 Dysphagia, oropharyngeal phase: Secondary | ICD-10-CM | POA: Diagnosis not present

## 2018-07-17 DIAGNOSIS — I4891 Unspecified atrial fibrillation: Secondary | ICD-10-CM | POA: Diagnosis not present

## 2018-07-17 DIAGNOSIS — R293 Abnormal posture: Secondary | ICD-10-CM | POA: Diagnosis not present

## 2018-07-17 DIAGNOSIS — A419 Sepsis, unspecified organism: Secondary | ICD-10-CM | POA: Diagnosis not present

## 2018-07-17 DIAGNOSIS — Z7901 Long term (current) use of anticoagulants: Secondary | ICD-10-CM | POA: Diagnosis not present

## 2018-07-17 DIAGNOSIS — L89620 Pressure ulcer of left heel, unstageable: Secondary | ICD-10-CM | POA: Diagnosis not present

## 2018-07-17 DIAGNOSIS — R41841 Cognitive communication deficit: Secondary | ICD-10-CM | POA: Diagnosis not present

## 2018-07-17 DIAGNOSIS — R1312 Dysphagia, oropharyngeal phase: Secondary | ICD-10-CM | POA: Diagnosis not present

## 2018-07-20 DIAGNOSIS — R1312 Dysphagia, oropharyngeal phase: Secondary | ICD-10-CM | POA: Diagnosis not present

## 2018-07-20 DIAGNOSIS — Z7901 Long term (current) use of anticoagulants: Secondary | ICD-10-CM | POA: Diagnosis not present

## 2018-07-20 DIAGNOSIS — R41841 Cognitive communication deficit: Secondary | ICD-10-CM | POA: Diagnosis not present

## 2018-07-20 DIAGNOSIS — I4891 Unspecified atrial fibrillation: Secondary | ICD-10-CM | POA: Diagnosis not present

## 2018-07-20 DIAGNOSIS — L89620 Pressure ulcer of left heel, unstageable: Secondary | ICD-10-CM | POA: Diagnosis not present

## 2018-07-20 DIAGNOSIS — R293 Abnormal posture: Secondary | ICD-10-CM | POA: Diagnosis not present

## 2018-07-20 DIAGNOSIS — A419 Sepsis, unspecified organism: Secondary | ICD-10-CM | POA: Diagnosis not present

## 2018-07-21 DIAGNOSIS — R1312 Dysphagia, oropharyngeal phase: Secondary | ICD-10-CM | POA: Diagnosis not present

## 2018-07-21 DIAGNOSIS — R293 Abnormal posture: Secondary | ICD-10-CM | POA: Diagnosis not present

## 2018-07-21 DIAGNOSIS — R41841 Cognitive communication deficit: Secondary | ICD-10-CM | POA: Diagnosis not present

## 2018-07-21 DIAGNOSIS — L89620 Pressure ulcer of left heel, unstageable: Secondary | ICD-10-CM | POA: Diagnosis not present

## 2018-07-21 DIAGNOSIS — A419 Sepsis, unspecified organism: Secondary | ICD-10-CM | POA: Diagnosis not present

## 2018-07-22 DIAGNOSIS — R0989 Other specified symptoms and signs involving the circulatory and respiratory systems: Secondary | ICD-10-CM | POA: Diagnosis not present

## 2018-07-22 DIAGNOSIS — R293 Abnormal posture: Secondary | ICD-10-CM | POA: Diagnosis not present

## 2018-07-22 DIAGNOSIS — R41841 Cognitive communication deficit: Secondary | ICD-10-CM | POA: Diagnosis not present

## 2018-07-22 DIAGNOSIS — J00 Acute nasopharyngitis [common cold]: Secondary | ICD-10-CM | POA: Diagnosis not present

## 2018-07-22 DIAGNOSIS — A419 Sepsis, unspecified organism: Secondary | ICD-10-CM | POA: Diagnosis not present

## 2018-07-22 DIAGNOSIS — L89620 Pressure ulcer of left heel, unstageable: Secondary | ICD-10-CM | POA: Diagnosis not present

## 2018-07-22 DIAGNOSIS — Z79899 Other long term (current) drug therapy: Secondary | ICD-10-CM | POA: Diagnosis not present

## 2018-07-22 DIAGNOSIS — R1312 Dysphagia, oropharyngeal phase: Secondary | ICD-10-CM | POA: Diagnosis not present

## 2018-07-23 DIAGNOSIS — R41841 Cognitive communication deficit: Secondary | ICD-10-CM | POA: Diagnosis not present

## 2018-07-23 DIAGNOSIS — L89620 Pressure ulcer of left heel, unstageable: Secondary | ICD-10-CM | POA: Diagnosis not present

## 2018-07-23 DIAGNOSIS — I4891 Unspecified atrial fibrillation: Secondary | ICD-10-CM | POA: Diagnosis not present

## 2018-07-23 DIAGNOSIS — R293 Abnormal posture: Secondary | ICD-10-CM | POA: Diagnosis not present

## 2018-07-23 DIAGNOSIS — R1312 Dysphagia, oropharyngeal phase: Secondary | ICD-10-CM | POA: Diagnosis not present

## 2018-07-23 DIAGNOSIS — A419 Sepsis, unspecified organism: Secondary | ICD-10-CM | POA: Diagnosis not present

## 2018-07-23 DIAGNOSIS — Z7901 Long term (current) use of anticoagulants: Secondary | ICD-10-CM | POA: Diagnosis not present

## 2018-07-24 LAB — CUP PACEART INCLINIC DEVICE CHECK
Battery Remaining Longevity: 108 mo
Brady Statistic AP VS Percent: 19 %
Brady Statistic AS VP Percent: 0 %
Brady Statistic AS VS Percent: 80 %
Date Time Interrogation Session: 20190626182019
Implantable Lead Implant Date: 20130610
Implantable Lead Location: 753859
Implantable Pulse Generator Implant Date: 20130610
Lead Channel Impedance Value: 311 Ohm
Lead Channel Pacing Threshold Amplitude: 1 V
Lead Channel Pacing Threshold Pulse Width: 0.4 ms
Lead Channel Pacing Threshold Pulse Width: 0.4 ms
Lead Channel Sensing Intrinsic Amplitude: 22.4 mV
Lead Channel Setting Pacing Amplitude: 2 V
Lead Channel Setting Pacing Amplitude: 2.5 V
Lead Channel Setting Sensing Sensitivity: 5.6 mV
MDC IDC LEAD IMPLANT DT: 20130610
MDC IDC LEAD LOCATION: 753860
MDC IDC MSMT BATTERY IMPEDANCE: 379 Ohm
MDC IDC MSMT BATTERY VOLTAGE: 2.77 V
MDC IDC MSMT LEADCHNL RA PACING THRESHOLD AMPLITUDE: 0.5 V
MDC IDC MSMT LEADCHNL RA SENSING INTR AMPL: 4 mV
MDC IDC MSMT LEADCHNL RV IMPEDANCE VALUE: 680 Ohm
MDC IDC SET LEADCHNL RV PACING PULSEWIDTH: 0.4 ms
MDC IDC STAT BRADY AP VP PERCENT: 0 %

## 2018-07-27 DIAGNOSIS — R41841 Cognitive communication deficit: Secondary | ICD-10-CM | POA: Diagnosis not present

## 2018-07-27 DIAGNOSIS — R1312 Dysphagia, oropharyngeal phase: Secondary | ICD-10-CM | POA: Diagnosis not present

## 2018-07-27 DIAGNOSIS — R293 Abnormal posture: Secondary | ICD-10-CM | POA: Diagnosis not present

## 2018-07-27 DIAGNOSIS — A419 Sepsis, unspecified organism: Secondary | ICD-10-CM | POA: Diagnosis not present

## 2018-07-27 DIAGNOSIS — L89620 Pressure ulcer of left heel, unstageable: Secondary | ICD-10-CM | POA: Diagnosis not present

## 2018-07-28 DIAGNOSIS — R41841 Cognitive communication deficit: Secondary | ICD-10-CM | POA: Diagnosis not present

## 2018-07-28 DIAGNOSIS — L89620 Pressure ulcer of left heel, unstageable: Secondary | ICD-10-CM | POA: Diagnosis not present

## 2018-07-28 DIAGNOSIS — A419 Sepsis, unspecified organism: Secondary | ICD-10-CM | POA: Diagnosis not present

## 2018-07-28 DIAGNOSIS — R1312 Dysphagia, oropharyngeal phase: Secondary | ICD-10-CM | POA: Diagnosis not present

## 2018-07-28 DIAGNOSIS — Z7901 Long term (current) use of anticoagulants: Secondary | ICD-10-CM | POA: Diagnosis not present

## 2018-07-28 DIAGNOSIS — I4891 Unspecified atrial fibrillation: Secondary | ICD-10-CM | POA: Diagnosis not present

## 2018-07-28 DIAGNOSIS — R293 Abnormal posture: Secondary | ICD-10-CM | POA: Diagnosis not present

## 2018-07-29 DIAGNOSIS — F339 Major depressive disorder, recurrent, unspecified: Secondary | ICD-10-CM | POA: Diagnosis not present

## 2018-07-29 DIAGNOSIS — F0391 Unspecified dementia with behavioral disturbance: Secondary | ICD-10-CM | POA: Diagnosis not present

## 2018-07-29 DIAGNOSIS — F411 Generalized anxiety disorder: Secondary | ICD-10-CM | POA: Diagnosis not present

## 2018-07-30 DIAGNOSIS — Z7901 Long term (current) use of anticoagulants: Secondary | ICD-10-CM | POA: Diagnosis not present

## 2018-07-30 DIAGNOSIS — I4891 Unspecified atrial fibrillation: Secondary | ICD-10-CM | POA: Diagnosis not present

## 2018-08-01 DIAGNOSIS — Z7901 Long term (current) use of anticoagulants: Secondary | ICD-10-CM | POA: Diagnosis not present

## 2018-08-01 DIAGNOSIS — I4891 Unspecified atrial fibrillation: Secondary | ICD-10-CM | POA: Diagnosis not present

## 2018-08-03 DIAGNOSIS — I4891 Unspecified atrial fibrillation: Secondary | ICD-10-CM | POA: Diagnosis not present

## 2018-08-03 DIAGNOSIS — Z7901 Long term (current) use of anticoagulants: Secondary | ICD-10-CM | POA: Diagnosis not present

## 2018-08-03 LAB — CUP PACEART REMOTE DEVICE CHECK
Battery Impedance: 430 Ohm
Battery Voltage: 2.78 V
Brady Statistic AP VS Percent: 6 %
Brady Statistic AS VP Percent: 0 %
Brady Statistic AS VS Percent: 93 %
Implantable Lead Implant Date: 20130610
Implantable Lead Location: 753860
Implantable Lead Model: 5076
Implantable Lead Model: 5092
Lead Channel Impedance Value: 709 Ohm
Lead Channel Pacing Threshold Amplitude: 0.5 V
Lead Channel Pacing Threshold Pulse Width: 0.4 ms
Lead Channel Pacing Threshold Pulse Width: 0.4 ms
Lead Channel Setting Pacing Amplitude: 2.5 V
Lead Channel Setting Pacing Pulse Width: 0.4 ms
Lead Channel Setting Sensing Sensitivity: 5.6 mV
MDC IDC LEAD IMPLANT DT: 20130610
MDC IDC LEAD LOCATION: 753859
MDC IDC MSMT BATTERY REMAINING LONGEVITY: 105 mo
MDC IDC MSMT LEADCHNL RA IMPEDANCE VALUE: 311 Ohm
MDC IDC MSMT LEADCHNL RV PACING THRESHOLD AMPLITUDE: 0.875 V
MDC IDC PG IMPLANT DT: 20130610
MDC IDC SESS DTM: 20190817153731
MDC IDC SET LEADCHNL RA PACING AMPLITUDE: 2 V
MDC IDC STAT BRADY AP VP PERCENT: 0 %

## 2018-08-05 DIAGNOSIS — F411 Generalized anxiety disorder: Secondary | ICD-10-CM | POA: Diagnosis not present

## 2018-08-05 DIAGNOSIS — F0391 Unspecified dementia with behavioral disturbance: Secondary | ICD-10-CM | POA: Diagnosis not present

## 2018-08-05 DIAGNOSIS — F339 Major depressive disorder, recurrent, unspecified: Secondary | ICD-10-CM | POA: Diagnosis not present

## 2018-08-07 DIAGNOSIS — I4891 Unspecified atrial fibrillation: Secondary | ICD-10-CM | POA: Diagnosis not present

## 2018-08-07 DIAGNOSIS — Z7901 Long term (current) use of anticoagulants: Secondary | ICD-10-CM | POA: Diagnosis not present

## 2018-08-14 DIAGNOSIS — J111 Influenza due to unidentified influenza virus with other respiratory manifestations: Secondary | ICD-10-CM | POA: Diagnosis not present

## 2018-08-14 DIAGNOSIS — Z7901 Long term (current) use of anticoagulants: Secondary | ICD-10-CM | POA: Diagnosis not present

## 2018-08-14 DIAGNOSIS — D649 Anemia, unspecified: Secondary | ICD-10-CM | POA: Diagnosis not present

## 2018-08-14 DIAGNOSIS — I4891 Unspecified atrial fibrillation: Secondary | ICD-10-CM | POA: Diagnosis not present

## 2018-08-14 DIAGNOSIS — R05 Cough: Secondary | ICD-10-CM | POA: Diagnosis not present

## 2018-08-16 DIAGNOSIS — R05 Cough: Secondary | ICD-10-CM | POA: Diagnosis not present

## 2018-08-16 DIAGNOSIS — I4891 Unspecified atrial fibrillation: Secondary | ICD-10-CM | POA: Diagnosis not present

## 2018-08-16 DIAGNOSIS — Z7901 Long term (current) use of anticoagulants: Secondary | ICD-10-CM | POA: Diagnosis not present

## 2018-08-16 DIAGNOSIS — F062 Psychotic disorder with delusions due to known physiological condition: Secondary | ICD-10-CM | POA: Diagnosis not present

## 2018-08-18 DIAGNOSIS — I4891 Unspecified atrial fibrillation: Secondary | ICD-10-CM | POA: Diagnosis not present

## 2018-08-18 DIAGNOSIS — Z7901 Long term (current) use of anticoagulants: Secondary | ICD-10-CM | POA: Diagnosis not present

## 2018-08-20 DIAGNOSIS — B351 Tinea unguium: Secondary | ICD-10-CM | POA: Diagnosis not present

## 2018-08-20 DIAGNOSIS — Z7984 Long term (current) use of oral hypoglycemic drugs: Secondary | ICD-10-CM | POA: Diagnosis not present

## 2018-08-20 DIAGNOSIS — E1140 Type 2 diabetes mellitus with diabetic neuropathy, unspecified: Secondary | ICD-10-CM | POA: Diagnosis not present

## 2018-08-20 DIAGNOSIS — L603 Nail dystrophy: Secondary | ICD-10-CM | POA: Diagnosis not present

## 2018-08-20 DIAGNOSIS — E1151 Type 2 diabetes mellitus with diabetic peripheral angiopathy without gangrene: Secondary | ICD-10-CM | POA: Diagnosis not present

## 2018-08-25 DIAGNOSIS — Z7901 Long term (current) use of anticoagulants: Secondary | ICD-10-CM | POA: Diagnosis not present

## 2018-08-25 DIAGNOSIS — I4891 Unspecified atrial fibrillation: Secondary | ICD-10-CM | POA: Diagnosis not present

## 2018-09-01 DIAGNOSIS — I4891 Unspecified atrial fibrillation: Secondary | ICD-10-CM | POA: Diagnosis not present

## 2018-09-01 DIAGNOSIS — Z7901 Long term (current) use of anticoagulants: Secondary | ICD-10-CM | POA: Diagnosis not present

## 2018-09-07 DIAGNOSIS — I4891 Unspecified atrial fibrillation: Secondary | ICD-10-CM | POA: Diagnosis not present

## 2018-09-07 DIAGNOSIS — Z7901 Long term (current) use of anticoagulants: Secondary | ICD-10-CM | POA: Diagnosis not present

## 2018-09-09 DIAGNOSIS — F0391 Unspecified dementia with behavioral disturbance: Secondary | ICD-10-CM | POA: Diagnosis not present

## 2018-09-09 DIAGNOSIS — F411 Generalized anxiety disorder: Secondary | ICD-10-CM | POA: Diagnosis not present

## 2018-09-09 DIAGNOSIS — F339 Major depressive disorder, recurrent, unspecified: Secondary | ICD-10-CM | POA: Diagnosis not present

## 2018-09-14 DIAGNOSIS — I4891 Unspecified atrial fibrillation: Secondary | ICD-10-CM | POA: Diagnosis not present

## 2018-09-14 DIAGNOSIS — Z7901 Long term (current) use of anticoagulants: Secondary | ICD-10-CM | POA: Diagnosis not present

## 2018-09-14 DIAGNOSIS — Z79899 Other long term (current) drug therapy: Secondary | ICD-10-CM | POA: Diagnosis not present

## 2018-09-15 DIAGNOSIS — I4891 Unspecified atrial fibrillation: Secondary | ICD-10-CM | POA: Diagnosis not present

## 2018-09-15 DIAGNOSIS — Z7901 Long term (current) use of anticoagulants: Secondary | ICD-10-CM | POA: Diagnosis not present

## 2018-09-15 DIAGNOSIS — R791 Abnormal coagulation profile: Secondary | ICD-10-CM | POA: Diagnosis not present

## 2018-09-16 DIAGNOSIS — I4891 Unspecified atrial fibrillation: Secondary | ICD-10-CM | POA: Diagnosis not present

## 2018-09-16 DIAGNOSIS — Z7901 Long term (current) use of anticoagulants: Secondary | ICD-10-CM | POA: Diagnosis not present

## 2018-09-18 DIAGNOSIS — I4891 Unspecified atrial fibrillation: Secondary | ICD-10-CM | POA: Diagnosis not present

## 2018-09-18 DIAGNOSIS — Z7901 Long term (current) use of anticoagulants: Secondary | ICD-10-CM | POA: Diagnosis not present

## 2018-09-22 DIAGNOSIS — I4891 Unspecified atrial fibrillation: Secondary | ICD-10-CM | POA: Diagnosis not present

## 2018-09-22 DIAGNOSIS — Z7901 Long term (current) use of anticoagulants: Secondary | ICD-10-CM | POA: Diagnosis not present

## 2018-09-23 DIAGNOSIS — F339 Major depressive disorder, recurrent, unspecified: Secondary | ICD-10-CM | POA: Diagnosis not present

## 2018-09-23 DIAGNOSIS — F0391 Unspecified dementia with behavioral disturbance: Secondary | ICD-10-CM | POA: Diagnosis not present

## 2018-09-23 DIAGNOSIS — F411 Generalized anxiety disorder: Secondary | ICD-10-CM | POA: Diagnosis not present

## 2018-09-25 DIAGNOSIS — Z7901 Long term (current) use of anticoagulants: Secondary | ICD-10-CM | POA: Diagnosis not present

## 2018-09-25 DIAGNOSIS — I4891 Unspecified atrial fibrillation: Secondary | ICD-10-CM | POA: Diagnosis not present

## 2018-09-28 ENCOUNTER — Ambulatory Visit (INDEPENDENT_AMBULATORY_CARE_PROVIDER_SITE_OTHER): Payer: Medicare Other

## 2018-09-28 ENCOUNTER — Telehealth: Payer: Self-pay

## 2018-09-28 DIAGNOSIS — I495 Sick sinus syndrome: Secondary | ICD-10-CM

## 2018-09-28 NOTE — Telephone Encounter (Signed)
Confirmed remote transmission w/ pt niece.

## 2018-09-28 NOTE — Progress Notes (Signed)
Remote pacemaker transmission.   

## 2018-10-01 ENCOUNTER — Encounter: Payer: Self-pay | Admitting: Cardiology

## 2018-10-01 DIAGNOSIS — Z7901 Long term (current) use of anticoagulants: Secondary | ICD-10-CM | POA: Diagnosis not present

## 2018-10-01 DIAGNOSIS — I4891 Unspecified atrial fibrillation: Secondary | ICD-10-CM | POA: Diagnosis not present

## 2018-10-09 DIAGNOSIS — I4891 Unspecified atrial fibrillation: Secondary | ICD-10-CM | POA: Diagnosis not present

## 2018-10-09 DIAGNOSIS — Z7901 Long term (current) use of anticoagulants: Secondary | ICD-10-CM | POA: Diagnosis not present

## 2018-10-13 ENCOUNTER — Ambulatory Visit: Payer: Medicare Other | Admitting: Podiatry

## 2018-10-13 DIAGNOSIS — I4891 Unspecified atrial fibrillation: Secondary | ICD-10-CM | POA: Diagnosis not present

## 2018-10-13 DIAGNOSIS — Z7901 Long term (current) use of anticoagulants: Secondary | ICD-10-CM | POA: Diagnosis not present

## 2018-10-15 DIAGNOSIS — R319 Hematuria, unspecified: Secondary | ICD-10-CM | POA: Diagnosis not present

## 2018-10-15 DIAGNOSIS — N390 Urinary tract infection, site not specified: Secondary | ICD-10-CM | POA: Diagnosis not present

## 2018-10-15 DIAGNOSIS — Z79899 Other long term (current) drug therapy: Secondary | ICD-10-CM | POA: Diagnosis not present

## 2018-10-20 DIAGNOSIS — Z7901 Long term (current) use of anticoagulants: Secondary | ICD-10-CM | POA: Diagnosis not present

## 2018-10-20 DIAGNOSIS — I4891 Unspecified atrial fibrillation: Secondary | ICD-10-CM | POA: Diagnosis not present

## 2018-10-21 DIAGNOSIS — F0391 Unspecified dementia with behavioral disturbance: Secondary | ICD-10-CM | POA: Diagnosis not present

## 2018-10-21 DIAGNOSIS — F411 Generalized anxiety disorder: Secondary | ICD-10-CM | POA: Diagnosis not present

## 2018-10-21 DIAGNOSIS — G4700 Insomnia, unspecified: Secondary | ICD-10-CM | POA: Diagnosis not present

## 2018-10-21 DIAGNOSIS — F339 Major depressive disorder, recurrent, unspecified: Secondary | ICD-10-CM | POA: Diagnosis not present

## 2018-10-22 DIAGNOSIS — I4891 Unspecified atrial fibrillation: Secondary | ICD-10-CM | POA: Diagnosis not present

## 2018-10-22 DIAGNOSIS — Z7901 Long term (current) use of anticoagulants: Secondary | ICD-10-CM | POA: Diagnosis not present

## 2018-10-25 DIAGNOSIS — I4891 Unspecified atrial fibrillation: Secondary | ICD-10-CM | POA: Diagnosis not present

## 2018-10-26 DIAGNOSIS — E1151 Type 2 diabetes mellitus with diabetic peripheral angiopathy without gangrene: Secondary | ICD-10-CM | POA: Diagnosis not present

## 2018-10-26 DIAGNOSIS — B351 Tinea unguium: Secondary | ICD-10-CM | POA: Diagnosis not present

## 2018-10-29 DIAGNOSIS — I4891 Unspecified atrial fibrillation: Secondary | ICD-10-CM | POA: Diagnosis not present

## 2018-10-29 DIAGNOSIS — Z7901 Long term (current) use of anticoagulants: Secondary | ICD-10-CM | POA: Diagnosis not present

## 2018-11-02 DIAGNOSIS — I4891 Unspecified atrial fibrillation: Secondary | ICD-10-CM | POA: Diagnosis not present

## 2018-11-02 DIAGNOSIS — Z7901 Long term (current) use of anticoagulants: Secondary | ICD-10-CM | POA: Diagnosis not present

## 2018-11-09 DIAGNOSIS — Z7901 Long term (current) use of anticoagulants: Secondary | ICD-10-CM | POA: Diagnosis not present

## 2018-11-11 DIAGNOSIS — A419 Sepsis, unspecified organism: Secondary | ICD-10-CM

## 2018-11-11 HISTORY — DX: Sepsis, unspecified organism: A41.9

## 2018-11-13 DIAGNOSIS — D649 Anemia, unspecified: Secondary | ICD-10-CM | POA: Diagnosis not present

## 2018-11-13 DIAGNOSIS — Z131 Encounter for screening for diabetes mellitus: Secondary | ICD-10-CM | POA: Diagnosis not present

## 2018-11-13 DIAGNOSIS — R0602 Shortness of breath: Secondary | ICD-10-CM | POA: Diagnosis not present

## 2018-11-13 DIAGNOSIS — R0989 Other specified symptoms and signs involving the circulatory and respiratory systems: Secondary | ICD-10-CM | POA: Diagnosis not present

## 2018-11-13 DIAGNOSIS — A419 Sepsis, unspecified organism: Secondary | ICD-10-CM | POA: Diagnosis not present

## 2018-11-15 ENCOUNTER — Encounter (HOSPITAL_COMMUNITY): Payer: Self-pay | Admitting: Emergency Medicine

## 2018-11-15 ENCOUNTER — Inpatient Hospital Stay (HOSPITAL_COMMUNITY)
Admission: EM | Admit: 2018-11-15 | Discharge: 2018-12-12 | DRG: 871 | Disposition: E | Payer: Medicare Other | Source: Skilled Nursing Facility | Attending: Family Medicine | Admitting: Family Medicine

## 2018-11-15 ENCOUNTER — Emergency Department (HOSPITAL_COMMUNITY): Payer: Medicare Other

## 2018-11-15 DIAGNOSIS — I4510 Unspecified right bundle-branch block: Secondary | ICD-10-CM | POA: Diagnosis present

## 2018-11-15 DIAGNOSIS — A419 Sepsis, unspecified organism: Secondary | ICD-10-CM | POA: Diagnosis present

## 2018-11-15 DIAGNOSIS — Z79891 Long term (current) use of opiate analgesic: Secondary | ICD-10-CM

## 2018-11-15 DIAGNOSIS — N184 Chronic kidney disease, stage 4 (severe): Secondary | ICD-10-CM | POA: Diagnosis present

## 2018-11-15 DIAGNOSIS — I2510 Atherosclerotic heart disease of native coronary artery without angina pectoris: Secondary | ICD-10-CM | POA: Diagnosis present

## 2018-11-15 DIAGNOSIS — E1151 Type 2 diabetes mellitus with diabetic peripheral angiopathy without gangrene: Secondary | ICD-10-CM | POA: Diagnosis present

## 2018-11-15 DIAGNOSIS — I959 Hypotension, unspecified: Secondary | ICD-10-CM | POA: Diagnosis not present

## 2018-11-15 DIAGNOSIS — A4189 Other specified sepsis: Secondary | ICD-10-CM | POA: Diagnosis not present

## 2018-11-15 DIAGNOSIS — Z9001 Acquired absence of eye: Secondary | ICD-10-CM

## 2018-11-15 DIAGNOSIS — Z515 Encounter for palliative care: Secondary | ICD-10-CM

## 2018-11-15 DIAGNOSIS — I130 Hypertensive heart and chronic kidney disease with heart failure and stage 1 through stage 4 chronic kidney disease, or unspecified chronic kidney disease: Secondary | ICD-10-CM | POA: Diagnosis present

## 2018-11-15 DIAGNOSIS — R4020 Unspecified coma: Secondary | ICD-10-CM | POA: Diagnosis not present

## 2018-11-15 DIAGNOSIS — L89329 Pressure ulcer of left buttock, unspecified stage: Secondary | ICD-10-CM | POA: Diagnosis not present

## 2018-11-15 DIAGNOSIS — E785 Hyperlipidemia, unspecified: Secondary | ICD-10-CM | POA: Diagnosis present

## 2018-11-15 DIAGNOSIS — N179 Acute kidney failure, unspecified: Secondary | ICD-10-CM | POA: Diagnosis not present

## 2018-11-15 DIAGNOSIS — R7989 Other specified abnormal findings of blood chemistry: Secondary | ICD-10-CM | POA: Diagnosis present

## 2018-11-15 DIAGNOSIS — I480 Paroxysmal atrial fibrillation: Secondary | ICD-10-CM | POA: Diagnosis present

## 2018-11-15 DIAGNOSIS — I495 Sick sinus syndrome: Secondary | ICD-10-CM | POA: Diagnosis present

## 2018-11-15 DIAGNOSIS — E872 Acidosis, unspecified: Secondary | ICD-10-CM | POA: Diagnosis present

## 2018-11-15 DIAGNOSIS — R945 Abnormal results of liver function studies: Secondary | ICD-10-CM | POA: Diagnosis present

## 2018-11-15 DIAGNOSIS — R0689 Other abnormalities of breathing: Secondary | ICD-10-CM | POA: Diagnosis not present

## 2018-11-15 DIAGNOSIS — I5041 Acute combined systolic (congestive) and diastolic (congestive) heart failure: Secondary | ICD-10-CM | POA: Diagnosis present

## 2018-11-15 DIAGNOSIS — R402212 Coma scale, best verbal response, none, at arrival to emergency department: Secondary | ICD-10-CM | POA: Diagnosis present

## 2018-11-15 DIAGNOSIS — R6520 Severe sepsis without septic shock: Secondary | ICD-10-CM | POA: Diagnosis not present

## 2018-11-15 DIAGNOSIS — R402112 Coma scale, eyes open, never, at arrival to emergency department: Secondary | ICD-10-CM | POA: Diagnosis present

## 2018-11-15 DIAGNOSIS — I5043 Acute on chronic combined systolic (congestive) and diastolic (congestive) heart failure: Secondary | ICD-10-CM | POA: Diagnosis not present

## 2018-11-15 DIAGNOSIS — R6521 Severe sepsis with septic shock: Secondary | ICD-10-CM | POA: Diagnosis not present

## 2018-11-15 DIAGNOSIS — I472 Ventricular tachycardia: Secondary | ICD-10-CM | POA: Diagnosis present

## 2018-11-15 DIAGNOSIS — I4729 Other ventricular tachycardia: Secondary | ICD-10-CM

## 2018-11-15 DIAGNOSIS — J9601 Acute respiratory failure with hypoxia: Secondary | ICD-10-CM | POA: Diagnosis not present

## 2018-11-15 DIAGNOSIS — J168 Pneumonia due to other specified infectious organisms: Secondary | ICD-10-CM | POA: Diagnosis not present

## 2018-11-15 DIAGNOSIS — Z66 Do not resuscitate: Secondary | ICD-10-CM | POA: Diagnosis present

## 2018-11-15 DIAGNOSIS — E1122 Type 2 diabetes mellitus with diabetic chronic kidney disease: Secondary | ICD-10-CM | POA: Diagnosis present

## 2018-11-15 DIAGNOSIS — J123 Human metapneumovirus pneumonia: Secondary | ICD-10-CM | POA: Diagnosis not present

## 2018-11-15 DIAGNOSIS — R509 Fever, unspecified: Secondary | ICD-10-CM | POA: Diagnosis not present

## 2018-11-15 DIAGNOSIS — Z8249 Family history of ischemic heart disease and other diseases of the circulatory system: Secondary | ICD-10-CM

## 2018-11-15 DIAGNOSIS — R402312 Coma scale, best motor response, none, at arrival to emergency department: Secondary | ICD-10-CM | POA: Diagnosis present

## 2018-11-15 DIAGNOSIS — Z9862 Peripheral vascular angioplasty status: Secondary | ICD-10-CM

## 2018-11-15 DIAGNOSIS — G9341 Metabolic encephalopathy: Secondary | ICD-10-CM | POA: Diagnosis present

## 2018-11-15 DIAGNOSIS — Z79899 Other long term (current) drug therapy: Secondary | ICD-10-CM

## 2018-11-15 DIAGNOSIS — Z85840 Personal history of malignant neoplasm of eye: Secondary | ICD-10-CM

## 2018-11-15 DIAGNOSIS — Z792 Long term (current) use of antibiotics: Secondary | ICD-10-CM

## 2018-11-15 DIAGNOSIS — J189 Pneumonia, unspecified organism: Secondary | ICD-10-CM

## 2018-11-15 DIAGNOSIS — N170 Acute kidney failure with tubular necrosis: Secondary | ICD-10-CM | POA: Diagnosis present

## 2018-11-15 DIAGNOSIS — E11319 Type 2 diabetes mellitus with unspecified diabetic retinopathy without macular edema: Secondary | ICD-10-CM | POA: Diagnosis present

## 2018-11-15 DIAGNOSIS — E559 Vitamin D deficiency, unspecified: Secondary | ICD-10-CM | POA: Diagnosis present

## 2018-11-15 DIAGNOSIS — Z7901 Long term (current) use of anticoagulants: Secondary | ICD-10-CM

## 2018-11-15 DIAGNOSIS — R404 Transient alteration of awareness: Secondary | ICD-10-CM | POA: Diagnosis not present

## 2018-11-15 DIAGNOSIS — I4819 Other persistent atrial fibrillation: Secondary | ICD-10-CM | POA: Diagnosis present

## 2018-11-15 DIAGNOSIS — D539 Nutritional anemia, unspecified: Secondary | ICD-10-CM | POA: Diagnosis present

## 2018-11-15 DIAGNOSIS — Z950 Presence of cardiac pacemaker: Secondary | ICD-10-CM

## 2018-11-15 DIAGNOSIS — I248 Other forms of acute ischemic heart disease: Secondary | ICD-10-CM | POA: Diagnosis present

## 2018-11-15 DIAGNOSIS — K759 Inflammatory liver disease, unspecified: Secondary | ICD-10-CM

## 2018-11-15 DIAGNOSIS — Z85828 Personal history of other malignant neoplasm of skin: Secondary | ICD-10-CM

## 2018-11-15 DIAGNOSIS — Z7189 Other specified counseling: Secondary | ICD-10-CM

## 2018-11-15 DIAGNOSIS — K7200 Acute and subacute hepatic failure without coma: Secondary | ICD-10-CM | POA: Diagnosis present

## 2018-11-15 DIAGNOSIS — H547 Unspecified visual loss: Secondary | ICD-10-CM | POA: Diagnosis present

## 2018-11-15 DIAGNOSIS — E1169 Type 2 diabetes mellitus with other specified complication: Secondary | ICD-10-CM | POA: Diagnosis present

## 2018-11-15 DIAGNOSIS — I420 Dilated cardiomyopathy: Secondary | ICD-10-CM | POA: Diagnosis present

## 2018-11-15 DIAGNOSIS — L89159 Pressure ulcer of sacral region, unspecified stage: Secondary | ICD-10-CM | POA: Diagnosis not present

## 2018-11-15 HISTORY — DX: Sepsis, unspecified organism: A41.9

## 2018-11-15 LAB — CBC WITH DIFFERENTIAL/PLATELET
Band Neutrophils: 0 %
Basophils Absolute: 0 10*3/uL (ref 0.0–0.1)
Basophils Relative: 0 %
Blasts: 0 %
Eosinophils Absolute: 0 10*3/uL (ref 0.0–0.5)
Eosinophils Relative: 0 %
HCT: 32.6 % — ABNORMAL LOW (ref 36.0–46.0)
Hemoglobin: 8.8 g/dL — ABNORMAL LOW (ref 12.0–15.0)
Lymphocytes Relative: 13 %
Lymphs Abs: 1 10*3/uL (ref 0.7–4.0)
MCH: 27 pg (ref 26.0–34.0)
MCHC: 27 g/dL — AB (ref 30.0–36.0)
MCV: 100 fL (ref 80.0–100.0)
Metamyelocytes Relative: 0 %
Monocytes Absolute: 0.3 10*3/uL (ref 0.1–1.0)
Monocytes Relative: 4 %
Myelocytes: 0 %
NEUTROS ABS: 6.1 10*3/uL (ref 1.7–7.7)
Neutrophils Relative %: 83 %
Other: 0 %
Platelets: 188 10*3/uL (ref 150–400)
Promyelocytes Relative: 0 %
RBC: 3.26 MIL/uL — ABNORMAL LOW (ref 3.87–5.11)
RDW: 16.7 % — AB (ref 11.5–15.5)
WBC Morphology: INCREASED
WBC: 7.4 10*3/uL (ref 4.0–10.5)
nRBC: 0 % (ref 0.0–0.2)
nRBC: 0 /100 WBC

## 2018-11-15 LAB — COMPREHENSIVE METABOLIC PANEL
ALK PHOS: 98 U/L (ref 38–126)
ALT: 236 U/L — ABNORMAL HIGH (ref 0–44)
AST: 367 U/L — ABNORMAL HIGH (ref 15–41)
Albumin: 2.7 g/dL — ABNORMAL LOW (ref 3.5–5.0)
Anion gap: 14 (ref 5–15)
BUN: 49 mg/dL — AB (ref 8–23)
CO2: 15 mmol/L — ABNORMAL LOW (ref 22–32)
Calcium: 8.2 mg/dL — ABNORMAL LOW (ref 8.9–10.3)
Chloride: 105 mmol/L (ref 98–111)
Creatinine, Ser: 2.47 mg/dL — ABNORMAL HIGH (ref 0.44–1.00)
GFR calc Af Amer: 19 mL/min — ABNORMAL LOW (ref >60)
GFR calc non Af Amer: 16 mL/min — ABNORMAL LOW (ref >60)
Glucose, Bld: 230 mg/dL — ABNORMAL HIGH (ref 70–99)
Potassium: 5.4 mmol/L — ABNORMAL HIGH (ref 3.5–5.1)
SODIUM: 134 mmol/L — AB (ref 135–145)
Total Bilirubin: 1 mg/dL (ref 0.3–1.2)
Total Protein: 6.4 g/dL — ABNORMAL LOW (ref 6.5–8.1)

## 2018-11-15 LAB — I-STAT ARTERIAL BLOOD GAS, ED
Acid-base deficit: 8 mmol/L — ABNORMAL HIGH (ref 0.0–2.0)
Bicarbonate: 18.3 mmol/L — ABNORMAL LOW (ref 20.0–28.0)
O2 Saturation: 99 %
TCO2: 19 mmol/L — ABNORMAL LOW (ref 22–32)
pCO2 arterial: 40.9 mmHg (ref 32.0–48.0)
pH, Arterial: 7.258 — ABNORMAL LOW (ref 7.350–7.450)
pO2, Arterial: 145 mmHg — ABNORMAL HIGH (ref 83.0–108.0)

## 2018-11-15 LAB — I-STAT CG4 LACTIC ACID, ED: Lactic Acid, Venous: 3.98 mmol/L (ref 0.5–1.9)

## 2018-11-15 MED ORDER — SODIUM CHLORIDE 0.9 % IV BOLUS (SEPSIS)
1000.0000 mL | Freq: Once | INTRAVENOUS | Status: AC
  Administered 2018-11-15: 1000 mL via INTRAVENOUS

## 2018-11-15 MED ORDER — VANCOMYCIN HCL IN DEXTROSE 1-5 GM/200ML-% IV SOLN
1000.0000 mg | Freq: Once | INTRAVENOUS | Status: AC
  Administered 2018-11-15: 1000 mg via INTRAVENOUS
  Filled 2018-11-15: qty 200

## 2018-11-15 MED ORDER — SODIUM CHLORIDE 0.9 % IV SOLN
2.0000 g | Freq: Once | INTRAVENOUS | Status: AC
  Administered 2018-11-15: 2 g via INTRAVENOUS
  Filled 2018-11-15: qty 2

## 2018-11-15 MED ORDER — SODIUM CHLORIDE 0.9 % IV BOLUS (SEPSIS)
250.0000 mL | Freq: Once | INTRAVENOUS | Status: AC
  Administered 2018-11-15: 250 mL via INTRAVENOUS

## 2018-11-15 NOTE — ED Notes (Signed)
Blood cultures collected , NS IV Bolus infusing , Vancomycin and Maxipime antibiotics infusing , IV sites intact , EDP spoke with family on admission and plan of care to pt. , Purewick external catheter intact .

## 2018-11-15 NOTE — ED Notes (Signed)
Dr Tomi Bamberger informed of lactic acid results 3.98

## 2018-11-15 NOTE — ED Triage Notes (Signed)
Patient arrived with EMS from [facility] , staff reported unresponsive this evening with fever and chest congestion , CBG= 115 , DNR , pt. receiving Epinephrine drip and BMV at arrival .

## 2018-11-15 NOTE — ED Provider Notes (Signed)
[facility] EMERGENCY DEPARTMENT Provider Note   CSN: [id number] Arrival date & time: 11/11/2018  2214  5 caveat: Altered mental status   History   Chief Complaint Chief Complaint  Patient presents with  . Unresponsive    HPI Kylie Sanchez is a 85 y.o. female.  HPI Patient presented to the emergency room for evaluation of altered mental status.  Patient is a resident of a nursing facility.  According to the staff she had decreased responsiveness today.  EMS found the patient to be warm to the touch and hypoxic.  Then initiated bag-valve-mask ventilation.  They also started IV fluids and started her on peripheral vasopressors.  Patient is unresponsive and is unable to provide any further history. Past Medical History:  Diagnosis Date  . Cancer ([place]) 2015   left eye  . Cataract   . CKD (chronic kidney disease) stage 3, GFR 30-59 ml/min (HCC)    due to DM  . Dizziness   . Hyperlipidemia   . Hypertension   . Hypertensive cardiovascular disease   . Impaired vision   . Memory loss   . Paroxysmal atrial fibrillation (HCC)   . Presence of permanent cardiac pacemaker   . Retinopathy   . Sick sinus syndrome ([place])   . Type II or unspecified type diabetes mellitus without mention of complication, not stated as uncontrolled     Patient Active Problem List   Diagnosis Date Noted  . AKI (acute kidney injury) ([place])   . Acute combined systolic and diastolic CHF, NYHA class 1 ([place])   . Acute metabolic encephalopathy 96/43/8381  . Demand ischemia ([place])   . Sepsis due to pneumonia ([place]) 04/04/2018  . Acute lower UTI 03/24/2018  . Closed right hip fracture, initial encounter ([place]) 03/22/2018  . Hip fracture ([place]) 03/22/2018  . Closed fracture of right hip ([place])   . Warfarin-induced coagulopathy ([place])   . Mild cognitive impairment 01/22/2018  . Afib ([place]) 11/17/2017  . Depression, major, recurrent, in partial remission ([place]) 11/17/2017  . Palliative care encounter   .  Goals of care, counseling/discussion   . Pressure injury of skin 05/22/2017  . Dilated cardiomyopathy (DISH) 05/20/2017  . Elevated troponin 05/19/2017  . Asymptomatic stenosis of right carotid artery 05/13/2017  . H/O enucleation of left eyeball 10/30/2015  . Macular degeneration, right eye 10/30/2015  . Pseudophakia, right eye 10/30/2015  . Basal cell carcinoma 02/10/2015  . DM neuropathy, type II diabetes mellitus ([place]) 02/10/2015  . Persistent atrial fibrillation 02/03/2015  . Type 2 diabetes mellitus with hyperlipidemia ([place]) 11/24/2014  . T2_NIDDM w/Stage 3 CKD (GFR 40 ml/min) 11/08/2014  . Vitamin D deficiency 11/08/2014  . Medication management 11/08/2014  . BP (high blood pressure) 11/08/2014  . Hyperlipidemia   . Sick sinus syndrome ([place])   . Retinopathy   . Long term current use of anticoagulant therapy 04/30/2012  . Tachycardia-bradycardia ([place]) 04/19/2012  . PAF (paroxysmal atrial fibrillation) ([place]) 04/18/2011  . HCD (hypertensive cardiovascular disease) 04/18/2011    Past Surgical History:  Procedure Laterality Date  . DENTAL SURGERY    . ENDARTERECTOMY Right 05/13/2017   Procedure: REDO ENDARTERECTOMY CAROTID Resection Redundant Internal Corotid Artery.;  Surgeon: Angelia Mould, MD;  Location: [facility];  Service: Vascular;  Laterality: Right;  . ENDARTERECTOMY Right 05/13/2017   Procedure: ENDARTERECTOMY CAROTID-RIGHT;  Surgeon: Serafina Mitchell, MD;  Location: [facility] OR;  Service: Vascular;  Laterality: Right;  . EYE SURGERY Left    tumor and eye removed  .  INTRAMEDULLARY (IM) NAIL INTERTROCHANTERIC Right 03/23/2018   Procedure: INTRAMEDULLARY (IM) NAIL RIGHT INTERTROCHANTRIC;  Surgeon: Mcarthur Rossetti, MD;  Location: [facility];  Service: Orthopedics;  Laterality: Right;  . PACEMAKER INSERTION  04/20/12   MDT Adapta L implanted by Dr Rayann Heman  . PATCH ANGIOPLASTY Right 05/13/2017   Procedure: PATCH ANGIOPLASTY USING Rueben Bash BIOLOGIC PATCH;  Surgeon: Serafina Mitchell, MD;  Location: [facility] OR;  Service: Vascular;  Laterality: Right;  . PERMANENT PACEMAKER INSERTION N/A 04/20/2012   Procedure: PERMANENT PACEMAKER INSERTION;  Surgeon: Thompson Grayer, MD;  Location: [facility] CATH LAB;  Service: Cardiovascular;  Laterality: N/A;     OB History   No obstetric history on file.      Home Medications    Prior to Admission medications   Medication Sig Start Date End Date Taking? Authorizing Provider  albuterol (PROVENTIL) (2.5 MG/3ML) 0.083% nebulizer solution Take 2.5 mg by nebulization every 6 (six) hours as needed for wheezing or shortness of breath.    [provider]  amiodarone (PACERONE) 200 MG tablet Take amiodarone 2 tabs (400 mg) daily until 04/26/2018.  Then start amiodarone 200 mg daily on 04/27/2018. 04/15/18   Rai, Vernelle Emerald, MD  atorvastatin (LIPITOR) 40 MG tablet Take 1 tablet (40 mg total) by mouth daily at 6 PM. 10/28/17   Unk Pinto, MD  b complex vitamins tablet Take 1 tablet by mouth daily.    [provider]  blood glucose meter kit and supplies Test sugars once daily. Dispense based insurance preference. E11.9 11/20/17   Vicie Mutters, PA-C  cholecalciferol (VITAMIN D) 1000 units tablet Take 1,000 Units by mouth every evening.     [provider]  Coenzyme Q10 (CO Q 10) 100 MG CAPS Take 100 mg by mouth every evening.     [provider]  escitalopram (LEXAPRO) 5 MG tablet Take 1 tablet (5 mg total) by mouth daily. 04/15/18   Rai, Vernelle Emerald, MD  furosemide (LASIX) 20 MG tablet Take 1 tablet daily for BP & Fluid Patient taking differently: Take 20 mg by mouth daily.  02/23/18   Unk Pinto, MD  gabapentin (NEURONTIN) 300 MG capsule Take 300 mg by mouth at bedtime.    [provider]  glucose blood test strip Check sugars once to twice a day for diabetes E11.21 11/17/17   Vicie Mutters, PA-C  HYDROcodone-acetaminophen (NORCO/VICODIN) 5-325 MG tablet Take 1 tablet by mouth every 4 (four) hours as  needed for moderate pain.    [provider]  ipratropium-albuterol (DUONEB) 0.5-2.5 (3) MG/3ML SOLN Take 3 mLs by nebulization every 6 (six) hours as needed. 04/15/18   Rai, Vernelle Emerald, MD  metoprolol tartrate (LOPRESSOR) 25 MG tablet Take 0.5 tablets (12.5 mg total) by mouth 2 (two) times daily. 04/15/18   Rai, Ripudeep K, MD  MULTIPLE VITAMIN PO Take 1 tablet by mouth daily.    [provider]  nitroGLYCERIN (NITROSTAT) 0.4 MG SL tablet Place 1 tablet (0.4 mg total) under the tongue every 5 (five) minutes as needed for chest pain. 10/30/17 01/28/19  Allred, Jeneen Rinks, MD  polyethylene glycol (MIRALAX / GLYCOLAX) packet Take 17 g by mouth 2 (two) times daily.    [provider]  potassium chloride (K-DUR,KLOR-CON) 10 MEQ tablet Take 1 tablet (10 mEq total) by mouth daily. 04/16/18   Rai, Vernelle Emerald, MD  PRESCRIPTION MEDICATION Take 8 oz by mouth 2 (two) times daily. MED PASS    [provider]  rOPINIRole (REQUIP) 0.5 MG  tablet Take 0.5 mg by mouth at bedtime.    [provider]  sennosides-docusate sodium (SENOKOT-S) 8.6-50 MG tablet Take 1 tablet by mouth daily.    [provider]  vitamin C (ASCORBIC ACID) 500 MG tablet Take 500 mg by mouth daily.    [provider]  warfarin (COUMADIN) 4 MG tablet Take as directed by [facility] Patient taking differently: Take 2-4 mg by mouth See admin instructions. Take 2 mg by mouth every Mon, Wed, and Fri. Take 4 mg by mouth every Tues, Thurs, Sat, and Sun. 10/30/17   Thompson Grayer, MD  zinc sulfate 220 (50 Zn) MG capsule Take 220 mg by mouth daily.    [provider]    Family History Family History  Problem Relation Age of Onset  . Heart attack Father   . Heart disease Father   . Hypertension Mother   . Heart disease Mother   . Cancer Other     Social History Social History   Tobacco Use  . Smoking status: Never Smoker  . Smokeless tobacco: Never Used  Substance Use Topics    . Alcohol use: No  . Drug use: No     Allergies   No known allergies   Review of Systems Review of Systems  All other systems reviewed and are negative.    Physical Exam Updated Vital Signs BP (!) 113/53   Pulse 60   Temp (!) 101.2 F (38.4 C) (Rectal)   Resp 14   Ht 1.6 m (5' 3" )   Wt 59 kg   SpO2 100%   BMI 23.03 kg/m   Physical Exam Vitals signs and nursing note reviewed.  Constitutional:      General: She is in acute distress.     Appearance: She is well-developed. She is ill-appearing.  HENT:     Head: Normocephalic and atraumatic.     Right Ear: External ear normal.     Left Ear: External ear normal.  Eyes:     General: No scleral icterus.       Right eye: No discharge.        Left eye: No discharge.     Conjunctiva/sclera: Conjunctivae normal.  Neck:     Musculoskeletal: Neck supple.     Trachea: No tracheal deviation.  Cardiovascular:     Rate and Rhythm: Normal rate and regular rhythm.  Pulmonary:     Effort: Pulmonary effort is normal. No respiratory distress.     Breath sounds: No stridor. Rhonchi and rales present. No wheezing.  Abdominal:     General: Bowel sounds are normal. There is no distension.     Palpations: Abdomen is soft.     Tenderness: There is no abdominal tenderness. There is no guarding or rebound.  Musculoskeletal:        General: No tenderness.  Skin:    General: Skin is warm and dry.     Findings: No rash.  Neurological:     Mental Status: She is unresponsive.     GCS: GCS eye subscore is 1. GCS verbal subscore is 1. GCS motor subscore is 1.     Cranial Nerves: No cranial nerve deficit.     Motor: No abnormal muscle tone or seizure activity.     Comments: Patient does not answer any questions, does not respond to medical intervention, IV start      ED Treatments / Results  Labs (all labs ordered are listed, but only abnormal results are displayed)  Labs Reviewed  COMPREHENSIVE METABOLIC PANEL - Abnormal; Notable  for the following components:      Result Value   Sodium 134 (*)    Potassium 5.4 (*)    CO2 15 (*)    Glucose, Bld 230 (*)    BUN 49 (*)    Creatinine, Ser 2.47 (*)    Calcium 8.2 (*)    Total Protein 6.4 (*)    Albumin 2.7 (*)    AST 367 (*)    ALT 236 (*)    GFR calc non Af Amer 16 (*)    GFR calc Af Amer 19 (*)    All other components within normal limits  CBC WITH DIFFERENTIAL/PLATELET - Abnormal; Notable for the following components:   RBC 3.26 (*)    Hemoglobin 8.8 (*)    HCT 32.6 (*)    MCHC 27.0 (*)    RDW 16.7 (*)    All other components within normal limits  I-STAT CG4 LACTIC ACID, ED - Abnormal; Notable for the following components:   Lactic Acid, Venous 3.98 (*)    All other components within normal limits  I-STAT ARTERIAL BLOOD GAS, ED - Abnormal; Notable for the following components:   pH, Arterial 7.258 (*)    pO2, Arterial 145.0 (*)    Bicarbonate 18.3 (*)    TCO2 19 (*)    Acid-base deficit 8.0 (*)    All other components within normal limits  CULTURE, BLOOD (ROUTINE X 2)  CULTURE, BLOOD (ROUTINE X 2)  URINALYSIS, ROUTINE W REFLEX MICROSCOPIC  INFLUENZA PANEL BY PCR (TYPE A & B)    EKG None  Radiology Dg Chest Portable 1 View  Result Date: 11/25/2018 CLINICAL DATA:  Hypotension, shortness of breath, fever to 101 degrees, code sepsis EXAM: PORTABLE CHEST 1 VIEW COMPARISON:  Portable exam at 2225 hrs compared to 04/04/2018 FINDINGS: Left subclavian sequential transvenous pacemaker leads project at right atrium and right ventricle. Normal heart size, mediastinal contours, and pulmonary vascularity. Rotated to the LEFT. Atherosclerotic calcification aorta. Patchy infiltrates RIGHT upper and RIGHT lower lobes consistent with pneumonia. Chronic accentuation of LEFT basilar markings. No definite pleural effusion or pneumothorax. Bones demineralized. IMPRESSION: RIGHT lung infiltrates consistent with pneumonia. Electronically Signed   By: Lavonia Dana M.D.   On:  11/20/2018 22:39    Procedures .Critical Care Performed by: Dorie Rank, MD Authorized by: Dorie Rank, MD   Critical care provider statement:    Critical care time (minutes):  45   Critical care was necessary to treat or prevent imminent or life-threatening deterioration of the following conditions:  Sepsis   Critical care was time spent personally by me on the following activities:  Discussions with consultants, evaluation of patient's response to treatment, examination of patient, ordering and performing treatments and interventions, ordering and review of laboratory studies, ordering and review of radiographic studies, pulse oximetry, re-evaluation of patient's condition, obtaining history from patient or surrogate and review of old charts   (including critical care time)  Medications Ordered in ED Medications  sodium chloride 0.9 % bolus 1,000 mL (0 mLs Intravenous Stopped 12/09/2018 2330)    And  sodium chloride 0.9 % bolus 1,000 mL (0 mLs Intravenous Stopped 11/14/2018 2330)    And  sodium chloride 0.9 % bolus 250 mL (0 mLs Intravenous Stopped 11/14/2018 2310)  vancomycin (VANCOCIN) IVPB 1000 mg/200 mL premix (0 mg Intravenous Stopped 12/05/2018 2330)  ceFEPIme (MAXIPIME) 2 g in sodium chloride 0.9 % 100 mL IVPB (0 g  Intravenous Stopped 12/05/2018 2309)     Initial Impression / Assessment and Plan / ED Course  I have reviewed the triage vital signs and the nursing notes.  Pertinent labs & imaging results that were available during my care of the patient were reviewed by me and considered in my medical decision making (see chart for details).  Clinical Course as of Nov 15 2329  Nancy Fetter Nov 15, 2018  2224 Discussed with Pt's brother.  Kellie Simmering.   Pt would not want intubation, cpr   [JK]  2329 Anemia is new compared to prior values   [JK]  2329 ABG consistent with a metabolic acidosis.  Electrolyte panel consistent with acute kidney injury as well as acute hepatic injury   [JK]  2330 x-ray is  consistent with pneumonia.   [JK]    Clinical Course User Index [JK] Dorie Rank, MD   Patient presented to the emergency room for probable sepsis.  Patient was febrile and had rhonchorous breath sounds.  Chest x-ray is consistent with pneumonia.  Patient's laboratory tests are notable for acute kidney injury, metabolic acidosis, and acute hepatitis.  This is concerning for endorgan dysfunction associated with her sepsis.  I discussed CODE STATUS and goals of care with the patient's brother.  Patient would be amenable to medications and fluids however she would not want to be debated or have any CPR.  She is currently on peripheral epinephrine.  Final Clinical Impressions(s) / ED Diagnoses   Final diagnoses:  Sepsis with acute renal failure, due to unspecified organism, unspecified acute renal failure type, unspecified whether septic shock present ([place])  Hepatitis  Metabolic acidosis  Pneumonia due to infectious organism, unspecified laterality, unspecified part of lung      Dorie Rank, MD 11/16/18 0002

## 2018-11-16 DIAGNOSIS — R402312 Coma scale, best motor response, none, at arrival to emergency department: Secondary | ICD-10-CM | POA: Diagnosis present

## 2018-11-16 DIAGNOSIS — I5043 Acute on chronic combined systolic (congestive) and diastolic (congestive) heart failure: Secondary | ICD-10-CM | POA: Diagnosis present

## 2018-11-16 DIAGNOSIS — D539 Nutritional anemia, unspecified: Secondary | ICD-10-CM | POA: Diagnosis present

## 2018-11-16 DIAGNOSIS — R6521 Severe sepsis with septic shock: Secondary | ICD-10-CM | POA: Diagnosis present

## 2018-11-16 DIAGNOSIS — I420 Dilated cardiomyopathy: Secondary | ICD-10-CM | POA: Diagnosis present

## 2018-11-16 DIAGNOSIS — I472 Ventricular tachycardia: Secondary | ICD-10-CM | POA: Diagnosis present

## 2018-11-16 DIAGNOSIS — I480 Paroxysmal atrial fibrillation: Secondary | ICD-10-CM | POA: Diagnosis present

## 2018-11-16 DIAGNOSIS — A4189 Other specified sepsis: Secondary | ICD-10-CM | POA: Diagnosis present

## 2018-11-16 DIAGNOSIS — E872 Acidosis, unspecified: Secondary | ICD-10-CM | POA: Diagnosis present

## 2018-11-16 DIAGNOSIS — J189 Pneumonia, unspecified organism: Secondary | ICD-10-CM

## 2018-11-16 DIAGNOSIS — R7989 Other specified abnormal findings of blood chemistry: Secondary | ICD-10-CM | POA: Diagnosis present

## 2018-11-16 DIAGNOSIS — Z66 Do not resuscitate: Secondary | ICD-10-CM | POA: Diagnosis present

## 2018-11-16 DIAGNOSIS — G9341 Metabolic encephalopathy: Secondary | ICD-10-CM

## 2018-11-16 DIAGNOSIS — I130 Hypertensive heart and chronic kidney disease with heart failure and stage 1 through stage 4 chronic kidney disease, or unspecified chronic kidney disease: Secondary | ICD-10-CM | POA: Diagnosis present

## 2018-11-16 DIAGNOSIS — E785 Hyperlipidemia, unspecified: Secondary | ICD-10-CM

## 2018-11-16 DIAGNOSIS — A419 Sepsis, unspecified organism: Secondary | ICD-10-CM

## 2018-11-16 DIAGNOSIS — N179 Acute kidney failure, unspecified: Secondary | ICD-10-CM | POA: Diagnosis not present

## 2018-11-16 DIAGNOSIS — I4819 Other persistent atrial fibrillation: Secondary | ICD-10-CM | POA: Diagnosis present

## 2018-11-16 DIAGNOSIS — K7200 Acute and subacute hepatic failure without coma: Secondary | ICD-10-CM | POA: Diagnosis present

## 2018-11-16 DIAGNOSIS — E1122 Type 2 diabetes mellitus with diabetic chronic kidney disease: Secondary | ICD-10-CM | POA: Diagnosis present

## 2018-11-16 DIAGNOSIS — R402112 Coma scale, eyes open, never, at arrival to emergency department: Secondary | ICD-10-CM | POA: Diagnosis present

## 2018-11-16 DIAGNOSIS — R402212 Coma scale, best verbal response, none, at arrival to emergency department: Secondary | ICD-10-CM | POA: Diagnosis present

## 2018-11-16 DIAGNOSIS — E1169 Type 2 diabetes mellitus with other specified complication: Secondary | ICD-10-CM

## 2018-11-16 DIAGNOSIS — N184 Chronic kidney disease, stage 4 (severe): Secondary | ICD-10-CM | POA: Diagnosis present

## 2018-11-16 DIAGNOSIS — I248 Other forms of acute ischemic heart disease: Secondary | ICD-10-CM | POA: Diagnosis present

## 2018-11-16 DIAGNOSIS — I5041 Acute combined systolic (congestive) and diastolic (congestive) heart failure: Secondary | ICD-10-CM | POA: Diagnosis not present

## 2018-11-16 DIAGNOSIS — N170 Acute kidney failure with tubular necrosis: Secondary | ICD-10-CM | POA: Diagnosis present

## 2018-11-16 DIAGNOSIS — R945 Abnormal results of liver function studies: Secondary | ICD-10-CM | POA: Diagnosis not present

## 2018-11-16 DIAGNOSIS — J9601 Acute respiratory failure with hypoxia: Secondary | ICD-10-CM | POA: Diagnosis present

## 2018-11-16 DIAGNOSIS — Z7189 Other specified counseling: Secondary | ICD-10-CM | POA: Diagnosis not present

## 2018-11-16 DIAGNOSIS — Z515 Encounter for palliative care: Secondary | ICD-10-CM | POA: Diagnosis not present

## 2018-11-16 DIAGNOSIS — J123 Human metapneumovirus pneumonia: Secondary | ICD-10-CM | POA: Diagnosis present

## 2018-11-16 DIAGNOSIS — I4729 Other ventricular tachycardia: Secondary | ICD-10-CM

## 2018-11-16 LAB — POCT I-STAT 3, ART BLOOD GAS (G3+)
Acid-base deficit: 12 mmol/L — ABNORMAL HIGH (ref 0.0–2.0)
Bicarbonate: 15.8 mmol/L — ABNORMAL LOW (ref 20.0–28.0)
O2 Saturation: 65 %
TCO2: 17 mmol/L — ABNORMAL LOW (ref 22–32)
pCO2 arterial: 45.3 mmHg (ref 32.0–48.0)
pH, Arterial: 7.151 — CL (ref 7.350–7.450)
pO2, Arterial: 44 mmHg — ABNORMAL LOW (ref 83.0–108.0)

## 2018-11-16 LAB — CBC
HCT: 31.3 % — ABNORMAL LOW (ref 36.0–46.0)
Hemoglobin: 8.7 g/dL — ABNORMAL LOW (ref 12.0–15.0)
MCH: 27.3 pg (ref 26.0–34.0)
MCHC: 27.8 g/dL — ABNORMAL LOW (ref 30.0–36.0)
MCV: 98.1 fL (ref 80.0–100.0)
Platelets: 192 10*3/uL (ref 150–400)
RBC: 3.19 MIL/uL — ABNORMAL LOW (ref 3.87–5.11)
RDW: 16.6 % — ABNORMAL HIGH (ref 11.5–15.5)
WBC: 7.6 10*3/uL (ref 4.0–10.5)
nRBC: 0 % (ref 0.0–0.2)

## 2018-11-16 LAB — URINALYSIS, ROUTINE W REFLEX MICROSCOPIC
Bacteria, UA: NONE SEEN
Bilirubin Urine: NEGATIVE
Glucose, UA: 50 mg/dL — AB
Hgb urine dipstick: NEGATIVE
Ketones, ur: 5 mg/dL — AB
Leukocytes, UA: NEGATIVE
Nitrite: NEGATIVE
PH: 5 (ref 5.0–8.0)
Protein, ur: 100 mg/dL — AB
Specific Gravity, Urine: 1.019 (ref 1.005–1.030)

## 2018-11-16 LAB — GLUCOSE, CAPILLARY
GLUCOSE-CAPILLARY: 108 mg/dL — AB (ref 70–99)
GLUCOSE-CAPILLARY: 198 mg/dL — AB (ref 70–99)
GLUCOSE-CAPILLARY: 231 mg/dL — AB (ref 70–99)
Glucose-Capillary: 193 mg/dL — ABNORMAL HIGH (ref 70–99)
Glucose-Capillary: 167 mg/dL — ABNORMAL HIGH (ref 70–99)
Glucose-Capillary: 208 mg/dL — ABNORMAL HIGH (ref 70–99)

## 2018-11-16 LAB — BASIC METABOLIC PANEL
ANION GAP: 17 — AB (ref 5–15)
Anion gap: 16 — ABNORMAL HIGH (ref 5–15)
BUN: 49 mg/dL — ABNORMAL HIGH (ref 8–23)
BUN: 52 mg/dL — ABNORMAL HIGH (ref 8–23)
CALCIUM: 7.8 mg/dL — AB (ref 8.9–10.3)
CO2: 12 mmol/L — ABNORMAL LOW (ref 22–32)
CO2: 15 mmol/L — ABNORMAL LOW (ref 22–32)
Calcium: 7.6 mg/dL — ABNORMAL LOW (ref 8.9–10.3)
Chloride: 109 mmol/L (ref 98–111)
Chloride: 108 mmol/L (ref 98–111)
Creatinine, Ser: 2.63 mg/dL — ABNORMAL HIGH (ref 0.44–1.00)
Creatinine, Ser: 2.76 mg/dL — ABNORMAL HIGH (ref 0.44–1.00)
GFR calc Af Amer: 18 mL/min — ABNORMAL LOW (ref >60)
GFR calc Af Amer: 17 mL/min — ABNORMAL LOW (ref >60)
GFR calc non Af Amer: 15 mL/min — ABNORMAL LOW (ref >60)
GFR calc non Af Amer: 14 mL/min — ABNORMAL LOW (ref >60)
Glucose, Bld: 236 mg/dL — ABNORMAL HIGH (ref 70–99)
Glucose, Bld: 126 mg/dL — ABNORMAL HIGH (ref 70–99)
Potassium: 4.7 mmol/L (ref 3.5–5.1)
Potassium: 4.3 mmol/L (ref 3.5–5.1)
Sodium: 137 mmol/L (ref 135–145)
Sodium: 140 mmol/L (ref 135–145)

## 2018-11-16 LAB — COMPREHENSIVE METABOLIC PANEL
ALBUMIN: 2.4 g/dL — AB (ref 3.5–5.0)
ALT: 431 U/L — ABNORMAL HIGH (ref 0–44)
AST: 809 U/L — ABNORMAL HIGH (ref 15–41)
Alkaline Phosphatase: 120 U/L (ref 38–126)
Anion gap: 15 (ref 5–15)
BILIRUBIN TOTAL: 1 mg/dL (ref 0.3–1.2)
BUN: 47 mg/dL — ABNORMAL HIGH (ref 8–23)
CO2: 12 mmol/L — ABNORMAL LOW (ref 22–32)
Calcium: 7.7 mg/dL — ABNORMAL LOW (ref 8.9–10.3)
Chloride: 107 mmol/L (ref 98–111)
Creatinine, Ser: 2.44 mg/dL — ABNORMAL HIGH (ref 0.44–1.00)
GFR calc Af Amer: 19 mL/min — ABNORMAL LOW (ref >60)
GFR calc non Af Amer: 17 mL/min — ABNORMAL LOW (ref >60)
GLUCOSE: 256 mg/dL — AB (ref 70–99)
Potassium: 4.8 mmol/L (ref 3.5–5.1)
Sodium: 134 mmol/L — ABNORMAL LOW (ref 135–145)
TOTAL PROTEIN: 5.9 g/dL — AB (ref 6.5–8.1)

## 2018-11-16 LAB — I-STAT ARTERIAL BLOOD GAS, ED
Acid-base deficit: 11 mmol/L — ABNORMAL HIGH (ref 0.0–2.0)
Bicarbonate: 17.1 mmol/L — ABNORMAL LOW (ref 20.0–28.0)
O2 Saturation: 93 %
TCO2: 18 mmol/L — ABNORMAL LOW (ref 22–32)
pCO2 arterial: 46.1 mmHg (ref 32.0–48.0)
pH, Arterial: 7.175 — CL (ref 7.350–7.450)
pO2, Arterial: 83 mmHg (ref 83.0–108.0)

## 2018-11-16 LAB — RESPIRATORY PANEL BY PCR
Adenovirus: NOT DETECTED
Bordetella pertussis: NOT DETECTED
CORONAVIRUS OC43-RVPPCR: NOT DETECTED
Chlamydophila pneumoniae: NOT DETECTED
Coronavirus 229E: NOT DETECTED
Coronavirus HKU1: NOT DETECTED
Coronavirus NL63: NOT DETECTED
INFLUENZA A-RVPPCR: NOT DETECTED
Influenza B: NOT DETECTED
Metapneumovirus: DETECTED — AB
Mycoplasma pneumoniae: NOT DETECTED
Parainfluenza Virus 1: NOT DETECTED
Parainfluenza Virus 2: NOT DETECTED
Parainfluenza Virus 3: NOT DETECTED
Parainfluenza Virus 4: NOT DETECTED
RESPIRATORY SYNCYTIAL VIRUS-RVPPCR: NOT DETECTED
Rhinovirus / Enterovirus: NOT DETECTED

## 2018-11-16 LAB — PROTIME-INR
INR: 1.85
Prothrombin Time: 21.1 seconds — ABNORMAL HIGH (ref 11.4–15.2)

## 2018-11-16 LAB — MAGNESIUM
Magnesium: 1.7 mg/dL (ref 1.7–2.4)
Magnesium: 1.5 mg/dL — ABNORMAL LOW (ref 1.7–2.4)
Magnesium: 2.1 mg/dL (ref 1.7–2.4)

## 2018-11-16 LAB — STREP PNEUMONIAE URINARY ANTIGEN: Strep Pneumo Urinary Antigen: NEGATIVE

## 2018-11-16 LAB — PHOSPHORUS
Phosphorus: 5.2 mg/dL — ABNORMAL HIGH (ref 2.5–4.6)
Phosphorus: 5.2 mg/dL — ABNORMAL HIGH (ref 2.5–4.6)

## 2018-11-16 LAB — I-STAT CG4 LACTIC ACID, ED: Lactic Acid, Venous: 2.37 mmol/L (ref 0.5–1.9)

## 2018-11-16 LAB — TROPONIN I
Troponin I: 0.06 ng/mL (ref <0.03)
Troponin I: 0.05 ng/mL (ref <0.03)

## 2018-11-16 LAB — APTT: aPTT: 36 seconds (ref 24–36)

## 2018-11-16 LAB — HEMOGLOBIN A1C
Hgb A1c MFr Bld: 7.2 % — ABNORMAL HIGH (ref 4.8–5.6)
Mean Plasma Glucose: 159.94 mg/dL

## 2018-11-16 LAB — LACTIC ACID, PLASMA
Lactic Acid, Venous: 4.3 mmol/L (ref 0.5–1.9)
Lactic Acid, Venous: 4.6 mmol/L (ref 0.5–1.9)
Lactic Acid, Venous: 4.2 mmol/L (ref 0.5–1.9)
Lactic Acid, Venous: 4.3 mmol/L (ref 0.5–1.9)
Lactic Acid, Venous: 3.4 mmol/L (ref 0.5–1.9)

## 2018-11-16 LAB — INFLUENZA PANEL BY PCR (TYPE A & B)
INFLAPCR: NEGATIVE
Influenza B By PCR: NEGATIVE

## 2018-11-16 LAB — CBG MONITORING, ED: GLUCOSE-CAPILLARY: 234 mg/dL — AB (ref 70–99)

## 2018-11-16 LAB — TYPE AND SCREEN
ABO/RH(D): O POS
Antibody Screen: NEGATIVE

## 2018-11-16 LAB — BRAIN NATRIURETIC PEPTIDE: B Natriuretic Peptide: 1603.3 pg/mL — ABNORMAL HIGH (ref 0.0–100.0)

## 2018-11-16 LAB — HEPARIN LEVEL (UNFRACTIONATED): Heparin Unfractionated: 0.22 IU/mL — ABNORMAL LOW (ref 0.30–0.70)

## 2018-11-16 LAB — PROCALCITONIN: PROCALCITONIN: 6.25 ng/mL

## 2018-11-16 LAB — MRSA PCR SCREENING: MRSA by PCR: POSITIVE — AB

## 2018-11-16 MED ORDER — MORPHINE SULFATE (PF) 2 MG/ML IV SOLN
1.0000 mg | Freq: Once | INTRAVENOUS | Status: AC
  Administered 2018-11-16: 1 mg via INTRAVENOUS

## 2018-11-16 MED ORDER — SODIUM CHLORIDE 0.9 % IV SOLN
1.0000 g | INTRAVENOUS | Status: DC
  Administered 2018-11-16: 1 g via INTRAVENOUS
  Filled 2018-11-16: qty 1

## 2018-11-16 MED ORDER — ONDANSETRON HCL 4 MG/2ML IJ SOLN: 4.0000 mg | Freq: Four times a day (QID) | INTRAMUSCULAR | Status: DC | PRN

## 2018-11-16 MED ORDER — ALBUTEROL SULFATE (2.5 MG/3ML) 0.083% IN NEBU
2.5000 mg | INHALATION_SOLUTION | RESPIRATORY_TRACT | Status: DC
  Administered 2018-11-16 – 2018-11-17 (×11): 2.5 mg via RESPIRATORY_TRACT
  Filled 2018-11-16 (×11): qty 3

## 2018-11-16 MED ORDER — FAMOTIDINE IN NACL 20-0.9 MG/50ML-% IV SOLN
20.0000 mg | Freq: Every day | INTRAVENOUS | Status: DC
  Administered 2018-11-16: 20 mg via INTRAVENOUS
  Filled 2018-11-16: qty 50

## 2018-11-16 MED ORDER — CALCIUM CHLORIDE 10 % IV SOLN
INTRAVENOUS | Status: AC
  Administered 2018-11-16: 14:00:00
  Filled 2018-11-16: qty 10

## 2018-11-16 MED ORDER — MORPHINE SULFATE (PF) 4 MG/ML IV SOLN
INTRAVENOUS | Status: AC
  Administered 2018-11-16: 4 mg
  Filled 2018-11-16: qty 1

## 2018-11-16 MED ORDER — SODIUM BICARBONATE 8.4 % IV SOLN
INTRAVENOUS | Status: DC
  Administered 2018-11-16: 13:00:00 via INTRAVENOUS
  Filled 2018-11-16 (×3): qty 150

## 2018-11-16 MED ORDER — LACTATED RINGERS IV BOLUS
500.0000 mL | Freq: Once | INTRAVENOUS | Status: AC
  Administered 2018-11-16: 500 mL via INTRAVENOUS

## 2018-11-16 MED ORDER — NOREPINEPHRINE BITARTRATE 1 MG/ML IV SOLN
0.0000 ug/min | INTRAVENOUS | Status: DC
  Administered 2018-11-16: 2 ug/min via INTRAVENOUS
  Administered 2018-11-17: 6 ug/min via INTRAVENOUS
  Filled 2018-11-16: qty 4

## 2018-11-16 MED ORDER — INSULIN ASPART 100 UNIT/ML ~~LOC~~ SOLN
0.0000 [IU] | SUBCUTANEOUS | Status: DC
  Administered 2018-11-16 (×2): 3 [IU] via SUBCUTANEOUS
  Administered 2018-11-16: 2 [IU] via SUBCUTANEOUS
  Administered 2018-11-16 (×2): 3 [IU] via SUBCUTANEOUS
  Administered 2018-11-17 (×2): 2 [IU] via SUBCUTANEOUS
  Administered 2018-11-17: 1 [IU] via SUBCUTANEOUS
  Administered 2018-11-17 (×2): 2 [IU] via SUBCUTANEOUS
  Administered 2018-11-18 (×3): 1 [IU] via SUBCUTANEOUS

## 2018-11-16 MED ORDER — CHLORHEXIDINE GLUCONATE 0.12 % MT SOLN
15.0000 mL | Freq: Two times a day (BID) | OROMUCOSAL | Status: DC
  Administered 2018-11-16 – 2018-11-20 (×7): 15 mL via OROMUCOSAL
  Filled 2018-11-16 (×5): qty 15

## 2018-11-16 MED ORDER — MORPHINE SULFATE (PF) 2 MG/ML IV SOLN: 2.0000 mg | Freq: Once | INTRAVENOUS | Status: DC

## 2018-11-16 MED ORDER — SODIUM CHLORIDE 0.9 % IV SOLN: 250.0000 mL | INTRAVENOUS | Status: DC

## 2018-11-16 MED ORDER — ORAL CARE MOUTH RINSE
15.0000 mL | Freq: Two times a day (BID) | OROMUCOSAL | Status: DC
  Administered 2018-11-16 – 2018-11-17 (×3): 15 mL via OROMUCOSAL

## 2018-11-16 MED ORDER — HEPARIN (PORCINE) 25000 UT/250ML-% IV SOLN
950.0000 [IU]/h | INTRAVENOUS | Status: DC
  Administered 2018-11-16: 800 [IU]/h via INTRAVENOUS
  Filled 2018-11-16: qty 250

## 2018-11-16 MED ORDER — MAGNESIUM SULFATE 2 GM/50ML IV SOLN
2.0000 g | Freq: Once | INTRAVENOUS | Status: AC
  Administered 2018-11-16: 2 g via INTRAVENOUS
  Filled 2018-11-16: qty 50

## 2018-11-16 MED ORDER — LEVOFLOXACIN IN D5W 500 MG/100ML IV SOLN
500.0000 mg | Freq: Once | INTRAVENOUS | Status: AC
  Administered 2018-11-16: 500 mg via INTRAVENOUS
  Filled 2018-11-16: qty 100

## 2018-11-16 MED ORDER — SODIUM BICARBONATE 8.4 % IV SOLN
100.0000 meq | Freq: Once | INTRAVENOUS | Status: AC
  Administered 2018-11-16: 100 meq via INTRAVENOUS

## 2018-11-16 MED ORDER — AMIODARONE HCL IN DEXTROSE 360-4.14 MG/200ML-% IV SOLN
INTRAVENOUS | Status: AC
  Administered 2018-11-16: 14:00:00
  Filled 2018-11-16: qty 400

## 2018-11-16 MED ORDER — VANCOMYCIN HCL 500 MG IV SOLR
500.0000 mg | INTRAVENOUS | Status: DC
  Administered 2018-11-17: 500 mg via INTRAVENOUS
  Filled 2018-11-16: qty 500

## 2018-11-16 MED ORDER — LACTATED RINGERS IV SOLN
INTRAVENOUS | Status: DC
  Administered 2018-11-16: 60 mL/h via INTRAVENOUS
  Administered 2018-11-16: 02:00:00 via INTRAVENOUS

## 2018-11-16 MED ORDER — SODIUM BICARBONATE 8.4 % IV SOLN
INTRAVENOUS | Status: AC
  Administered 2018-11-16: 100 meq via INTRAVENOUS
  Filled 2018-11-16: qty 100

## 2018-11-16 MED ORDER — LEVOFLOXACIN IN D5W 500 MG/100ML IV SOLN: 500.0000 mg | INTRAVENOUS | Status: DC

## 2018-11-16 NOTE — ED Notes (Signed)
Admitting MD ( Dr. Carson Myrtle) advised RN that pt. had a brief episode of VFib/PulselessV-tach while evaluating pt. At bedside , pt. converted back to NSR without intervention , EDP notified.

## 2018-11-16 NOTE — Progress Notes (Signed)
eLink Physician-Brief Progress Note Patient Name: Kylie Sanchez DOB: October 16, 1925 MRN: [id number]   Date of Service  11/16/2018  HPI/Events of Note  Hypotension - BP = 37/24 and VBg = 7.15/45.6/44.  eICU Interventions  Will order: 1. NaHCO3 100 meq IV now.      Intervention Category Major Interventions: Hypotension - evaluation and management  [name] 11/16/2018, 4:17 AM

## 2018-11-16 NOTE — Progress Notes (Signed)
HISTORY & PHYSICAL  Patient Name: Kylie Sanchez MRN: [id number] DOB: 1925/02/25    ADMISSION DATE:  11/26/2018 DATE OF SERVICE:  11/16/2018  CHIEF COMPLAINT:  Altered mental status   HISTORY OF PRESENT ILLNESS  This 85 y.o. Caucasian female nursing home resident transferred to the [facility] Emergency Department with complaints of altered mental status.  She is a [facility].  At time of clinical interview, the patient is on noninvasive positive pressure ventilatory support.  She is lethargic (albeit arousable to noxious stimuli) and unable to participate in clinical interview.  Review of the chart reveals that the patient is DNR.  She is unable to provide clinical history or review of system systems at this time.  According to available documentation, patient presented from the nursing home with reports of altered mental status.  In the emergency department, the patient was started on cefepime and vancomycin for clinical suspicion of pneumonia.  According to documentation, patient has been treated with epinephrine infusion; however, at the time of clinical interview, no intravenous infusion is currently being administered.  Blood pressure 112/61.  REVIEW OF SYSTEMS This patient is critically ill and cannot provide additional history nor review of systems due to mental status/unconsciousness.   PAST MEDICAL/SURGICAL/SOCIAL/FAMILY HISTORIES   Past Medical History:  Diagnosis Date  . Cancer ([place]) 2015   left eye  . Cataract   . CKD (chronic kidney disease) stage 3, GFR 30-59 ml/min (HCC)    due to DM  . Dizziness   . Hyperlipidemia   . Hypertension   . Hypertensive cardiovascular disease   . Impaired vision   . Memory loss   . Paroxysmal atrial fibrillation (HCC)   . Presence of permanent cardiac pacemaker   . Retinopathy   . Sick sinus syndrome ([place])   . Type II or unspecified type diabetes mellitus without mention of complication, not stated  as uncontrolled     Past Surgical History:  Procedure Laterality Date  . DENTAL SURGERY    . ENDARTERECTOMY Right 05/13/2017   Procedure: REDO ENDARTERECTOMY CAROTID Resection Redundant Internal Corotid Artery.;  Surgeon: Angelia Mould, MD;  Location: [place];  Service: Vascular;  Laterality: Right;  . ENDARTERECTOMY Right 05/13/2017   Procedure: ENDARTERECTOMY CAROTID-RIGHT;  Surgeon: Serafina Mitchell, MD;  Location: [facility] OR;  Service: Vascular;  Laterality: Right;  . EYE SURGERY Left    tumor and eye removed  . INTRAMEDULLARY (IM) NAIL INTERTROCHANTERIC Right 03/23/2018   Procedure: INTRAMEDULLARY (IM) NAIL RIGHT INTERTROCHANTRIC;  Surgeon: Mcarthur Rossetti, MD;  Location: [place];  Service: Orthopedics;  Laterality: Right;  . PACEMAKER INSERTION  04/20/12   MDT Adapta L implanted by Dr Rayann Heman  . PATCH ANGIOPLASTY Right 05/13/2017   Procedure: PATCH ANGIOPLASTY USING Rueben Bash BIOLOGIC PATCH;  Surgeon: Serafina Mitchell, MD;  Location: [facility] OR;  Service: Vascular;  Laterality: Right;  . PERMANENT PACEMAKER INSERTION N/A 04/20/2012   Procedure: PERMANENT PACEMAKER INSERTION;  Surgeon: Thompson Grayer, MD;  Location: [facility] CATH LAB;  Service: Cardiovascular;  Laterality: N/A;    Social History   Tobacco Use  . Smoking status: Never Smoker  . Smokeless tobacco: Never Used  Substance Use Topics  . Alcohol use: No    Family History  Problem Relation Age of Onset  . Heart attack Father   . Heart disease Father   . Hypertension Mother   . Heart disease Mother   . Cancer Other     Allergies  Allergen  Reactions  . No Known Allergies     Prior to Admission medications   Medication Sig Start Date End Date Taking? Authorizing Provider  albuterol (PROVENTIL) (2.5 MG/3ML) 0.083% nebulizer solution Take 2.5 mg by nebulization every 6 (six) hours as needed for wheezing or shortness of breath.   Yes [provider]  Amino Acids-Protein Hydrolys (FEEDING SUPPLEMENT, PRO-STAT SUGAR  FREE 64,) LIQD Take 30 mLs by mouth daily.   Yes [provider]  amiodarone (PACERONE) 200 MG tablet Take amiodarone 2 tabs (400 mg) daily until 04/26/2018.  Then start amiodarone 200 mg daily on 04/27/2018. Patient taking differently: Take 200 mg by mouth daily.  04/15/18  Yes Rai, Ripudeep K, MD  amoxicillin-clavulanate (AUGMENTIN) 875-125 MG tablet Take 1 tablet by mouth 2 (two) times daily.   Yes [provider]  atorvastatin (LIPITOR) 40 MG tablet Take 1 tablet (40 mg total) by mouth daily at 6 PM. 10/28/17  Yes Unk Pinto, MD  b complex vitamins tablet Take 1 tablet by mouth daily.   Yes [provider]  blood glucose meter kit and supplies Test sugars once daily. Dispense based insurance preference. E11.9 11/20/17  Yes Vicie Mutters, PA-C  cetirizine (ZYRTEC) 10 MG tablet Take 10 mg by mouth daily as needed for allergies.   Yes [provider]  cholecalciferol (VITAMIN D) 1000 units tablet Take 1,000 Units by mouth every evening.    Yes [provider]  Coenzyme Q10 (CO Q 10) 100 MG CAPS Take 100 mg by mouth every evening.    Yes [provider]  escitalopram (LEXAPRO) 5 MG tablet Take 1 tablet (5 mg total) by mouth daily. Patient taking differently: Take 15 mg by mouth daily.  04/15/18  Yes Rai, Ripudeep K, MD  furosemide (LASIX) 20 MG tablet Take 1 tablet daily for BP & Fluid Patient taking differently: Take 40 mg by mouth daily.  02/23/18  Yes Unk Pinto, MD  glucose blood test strip Check sugars once to twice a day for diabetes E11.21 11/17/17  Yes Vicie Mutters, PA-C  HYDROcodone-acetaminophen (NORCO/VICODIN) 5-325 MG tablet Take 1-2 tablets by mouth every 4 (four) hours as needed for moderate pain.    Yes [provider]  LORazepam (ATIVAN) 0.5 MG tablet Take 0.25 mg by mouth daily as needed for anxiety.   Yes [provider]  metoprolol tartrate (LOPRESSOR) 25 MG tablet Take 0.5 tablets (12.5 mg total) by  mouth 2 (two) times daily. 04/15/18  Yes Rai, Ripudeep K, MD  MULTIPLE VITAMIN PO Take 1 tablet by mouth daily.   Yes [provider]  multivitamin-lutein (OCUVITE-LUTEIN) CAPS capsule Take 1 capsule by mouth 2 (two) times daily.   Yes [provider]  nitroGLYCERIN (NITROSTAT) 0.4 MG SL tablet Place 1 tablet (0.4 mg total) under the tongue every 5 (five) minutes as needed for chest pain. 10/30/17 01/28/19 Yes Allred, Jeneen Rinks, MD  OLANZapine (ZYPREXA) 7.5 MG tablet Take 7.5 mg by mouth at bedtime.   Yes [provider]  polyethylene glycol (MIRALAX / GLYCOLAX) packet Take 17 g by mouth daily.    Yes [provider]  potassium chloride (K-DUR,KLOR-CON) 10 MEQ tablet Take 1 tablet (10 mEq total) by mouth daily. 04/16/18  Yes Rai, Ripudeep K, MD  PRESCRIPTION MEDICATION Take 8 oz by mouth 2 (two) times daily. MED PASS   Yes [provider]  rOPINIRole (REQUIP) 0.5 MG tablet Take 0.5 mg by mouth at bedtime.   Yes [provider]  sennosides-docusate  sodium (SENOKOT-S) 8.6-50 MG tablet Take 1 tablet by mouth daily.   Yes [provider]  warfarin (COUMADIN) 4 MG tablet Take as directed by [facility] Patient taking differently: Take 1 mg by mouth daily.  10/30/17  Yes Allred, Jeneen Rinks, MD  ipratropium-albuterol (DUONEB) 0.5-2.5 (3) MG/3ML SOLN Take 3 mLs by nebulization every 6 (six) hours as needed. 04/15/18   Mendel Corning, MD    Current Facility-Administered Medications  Medication Dose Route Frequency Provider Last Rate Last Dose  . 0.9 %  sodium chloride infusion  250 mL Intravenous Continuous Renee Pain, MD      . albuterol (PROVENTIL) (2.5 MG/3ML) 0.083% nebulizer solution 2.5 mg  2.5 mg Nebulization Q4H Renee Pain, MD   2.5 mg at 11/16/18 0850  . ceFEPIme (MAXIPIME) 1 g in sodium chloride 0.9 % 100 mL IVPB  1 g Intravenous Q24H Erenest Blank, RPH      . chlorhexidine (PERIDEX) 0.12 % solution 15 mL  15 mL Mouth Rinse BID  Renee Pain, MD      . famotidine (PEPCID) IVPB 20 mg premix  20 mg Intravenous Daily Renee Pain, MD 100 mL/hr at 11/16/18 0959 20 mg at 11/16/18 0959  . heparin ADULT infusion 100 units/mL (25000 units/287m sodium chloride 0.45%)  800 Units/hr Intravenous Continuous JRenee Pain MD 8 mL/hr at 11/16/18 0804 800 Units/hr at 11/16/18 0804  . insulin aspart (novoLOG) injection 0-9 Units  0-9 Units Subcutaneous Q4H JRenee Pain MD   2 Units at 11/16/18 [phone] . lactated ringers infusion   Intravenous Continuous JRenee Pain MD 60 mL/hr at 11/16/18 0700    . [START ON 11/17/2018] levofloxacin (LEVAQUIN) IVPB 500 mg  500 mg Intravenous Q48H JRenee Pain MD      . magnesium sulfate IVPB 2 g 50 mL  2 g Intravenous Once ALorella Nimrod MD 50 mL/hr at 11/16/18 1036 2 g at 11/16/18 1036  . MEDLINE mouth rinse  15 mL Mouth Rinse q12n4p JRenee Pain MD      . norepinephrine (LEVOPHED) 4 mg in dextrose 5 % 250 mL (0.016 mg/mL) infusion  0-40 mcg/min Intravenous Titrated JRenee Pain MD 11.25 mL/hr at 11/16/18 0700 3 mcg/min at 11/16/18 0700  . ondansetron (ZOFRAN) injection 4 mg  4 mg Intravenous Q6H PRN JRenee Pain MD      . [Derrill MemoON 11/17/2018] vancomycin (VANCOCIN) 500 mg in sodium chloride 0.9 % 100 mL IVPB  500 mg Intravenous Q36H Ledford, James L, RPH        VITAL SIGNS: BP (!) 137/52   Pulse 75   Temp 98.2 F (36.8 C)   Resp 13   Ht 5' 3"  (1.6 m)   Wt 66.4 kg   SpO2 100%   BMI 25.93 kg/m   VENTILATOR SETTINGS: Vent Mode: BIPAP FiO2 (%):  [40 %-60 %] 40 % Set Rate:  [8 bmp-24 bmp] 24 bmp PEEP:  [6 cmH20] 6 cmH20  INTAKE / OUTPUT: I/O last 3 completed shifts: In: 5046.5 [I.V.:2496.5; IV Piggyback:2550] Out: 0   PHYSICAL EXAMINATION: General.  Well-developed elderly lady, open eyes to noxious stimuli, moans and spontaneously move all limbs, not following any commands.  On BiPAP. HENT.  Atraumatic, no scleral icterus, left eye with patch. Lungs.   Coarse breath sounds with right lower lobe rhonchi. CV.  Regular rate and rhythm, no murmurs. Abdomen.  Soft, nontender, nondistended, bowel sounds positive. Neuro.  Lethargic and drowsy, spontaneous movements in all 4 limbs, not following any commands. Extremities.  No edema, no cyanosis, pulses intact bilaterally. GU.  On pure wick, bladder scan with no urine.  LABS:  BASIC METABOLIC PROFILE Recent Labs  Lab 12/04/2018 2230 11/16/18 0429 11/16/18 0608  NA 134* PENDING 137  K 5.4* PENDING 4.7  CL 105 PENDING 109  CO2 15* PENDING 12*  BUN 49* 47* 49*  CREATININE 2.47* 2.44* 2.63*  GLUCOSE 230* 256* 236*  CALCIUM 8.2* PENDING 7.6*  MG  --  1.7 1.5*  PHOS  --  5.2* 5.2*    Glucose Recent Labs  Lab 11/16/18 0206 11/16/18 0554 11/16/18 0755  GLUCAP 234* 231* 193*    Liver Enzymes Recent Labs  Lab 11/30/2018 2230 11/16/18 0429  AST 367* 809*  ALT 236* 431*  ALKPHOS 98 120  BILITOT 1.0 1.0  ALBUMIN 2.7* 2.4*    CBC Recent Labs  Lab 11/26/2018 2230 11/16/18 0608  WBC 7.4 7.6  HGB 8.8* 8.7*  HCT 32.6* 31.3*  PLT 188 192    COAGULATION STUDIES Recent Labs  Lab 11/16/18 0608  APTT 36  INR 1.85    SEPSIS MARKERS Recent Labs  Lab 11/16/18 0038 11/16/18 0429 11/16/18 0432 11/16/18 0827  LATICACIDVEN 2.37*  --  4.3* 4.6*  PROCALCITON  --  6.25  --   --     ABG Recent Labs  Lab 11/27/2018 2237 11/16/18 0217 11/16/18 0413  PHART 7.258* 7.175* 7.151*  PCO2ART 40.9 46.1 45.3  PO2ART 145.0* 83.0 44.0*    Cardiac Enzymes Recent Labs  Lab 11/16/18 0429 11/16/18 0608  TROPONINI 0.06* 0.05*    Imaging Dg Chest Portable 1 View  Result Date: 11/18/2018 CLINICAL DATA:  Hypotension, shortness of breath, fever to 101 degrees, code sepsis EXAM: PORTABLE CHEST 1 VIEW COMPARISON:  Portable exam at 2225 hrs compared to 04/04/2018 FINDINGS: Left subclavian sequential transvenous pacemaker leads project at right atrium and right ventricle. Normal heart size,  mediastinal contours, and pulmonary vascularity. Rotated to the LEFT. Atherosclerotic calcification aorta. Patchy infiltrates RIGHT upper and RIGHT lower lobes consistent with pneumonia. Chronic accentuation of LEFT basilar markings. No definite pleural effusion or pneumothorax. Bones demineralized. IMPRESSION: RIGHT lung infiltrates consistent with pneumonia. Electronically Signed   By: Lavonia Dana M.D.   On: 11/28/2018 22:39    STUDIES:  12-lead EKG (11/22/2018 2214): Sinus rhythm with intraventricular conduction delay. 12-lead EKG (11/16/2018 0111): Ventricular tachycardia (pulseless) 12-lead EKG (11/16/2018 0116): Sinus bradycardia with right bundle branch block  CULTURES: Results for orders placed or performed during the hospital encounter of 11/29/2018  MRSA PCR Screening     Status: Abnormal   Collection Time: 11/16/18  5:14 AM  Result Value Ref Range Status   MRSA by PCR POSITIVE (A) NEGATIVE Final    Comment:        The GeneXpert MRSA Assay (FDA approved for NASAL specimens only), is one component of a comprehensive MRSA colonization surveillance program. It is not intended to diagnose MRSA infection nor to guide or monitor treatment for MRSA infections. RESULT CALLED TO, READ BACK BY AND VERIFIED WITH: RN Wilhemina Cash [id number] MLM Performed at [facility], [place] [address] [place], [place] [address]     ANTIBIOTICS: Augmentin (11/14/2018-11/28/2018) Cefepime/vancomycin (11/16/2018>>  SIGNIFICANT EVENTS: 11/25/2018: Transferred from [place] center with altered mental status and a clinical impression of pneumonia. 11/16/2018: Pulseless ventricular tachycardia in ER.  Spontaneously converted to sinus bradyarrhythmia.   ASSESSMENT / PLAN:   Sepsis secondary to pneumonia. Metabolic Anion gap acidosis. Did had one episode of  being febrile at 101.2, afebrile since morning. Chest x-ray with right upper and lower lobe infiltrate. Blood cultures pending. Urine cultures  pending. Lactic acid started trending up again around 4:30 AM, she was given 2 ampules of bicarb at that time due to pH of 7.15. -Continue cefepime and vancomycin. -Discontinue Levaquin. -Continue trending lactic acid. -Give 500 cc of LR bolus. -Start Bicarb infusion at 75/hr. -Continue NIPPV support.  Paroxysmal atrial fibrillation.   Currently in sinus rhythm.  Mild tachycardia. She was on amiodarone, Coumadin and metoprolol at home. Continue on Heparin as patient is n.p.o. with minimum response.  2 episodes of pulseless ventricular tachycardia.  Patient has a pacemaker in place. -Continue monitoring. -Cardiology consult  Hypertension.  Currently normotensive and she was placed on Levophed at 2 MCG. -Continue Levophed and monitor blood pressure.  HFrEF.  Echo done in May 2019 shows ejection fraction of 40 to 45% with grade 1 diastolic dysfunction. BNP elevated at 1603 today. Patient receives approximately 3 L of fluids due to sepsis. Does not appear much volume overload on exam. -We will continue to monitor-diurese as needed.  Elevated troponin.  Troponin was positive at 0.06, most likely due to demand, now trending down.   AKI with CKD.  Baseline creatinine around 1.7-1.8 Currently elevated at 2.63.  Patient is anuric with bladder scan showing no urine. -Continue to monitor renal function. -Nephrology consult.  Abnormal LFTs.  Transaminitis most likely secondary to sepsis. -Continue to monitor.  DM.  Current A1c of 7.2. -Continue SSI every 4 hourly.   Anemia.  Most likely secondary to CKD. Baseline hemoglobin around 9-10, currently 8.7.  Hypomagnesemia.  Replete as needed.      FAMILY  - Updates:   - Inter-disciplinary family meet or Palliative Care meeting due by:  11/22/2018  Lorella Nimrod MD PGY3 Pager. [phone]

## 2018-11-16 NOTE — Progress Notes (Signed)
ANTICOAGULATION CONSULT NOTE - Initial Consult  Pharmacy Consult for Heparin when INR is <2 (warfarin on hold) Indication: atrial fibrillation  Allergies  Allergen Reactions  . No Known Allergies    Patient Measurements: Height: 5\' 3"  (160 cm) Weight: 130 lb (59 kg) IBW/kg (Calculated) : 52.4  Vital Signs: Temp: 98.3 F (36.8 C) (01/06 0211) Temp Source: Axillary (01/06 0211) BP: 131/51 (01/06 0215) Pulse Rate: 102 (01/06 0335)  Labs: Recent Labs    11/25/2018 2230  HGB 8.8*  HCT 32.6*  PLT 188  CREATININE 2.47*    Estimated Creatinine Clearance: 12.3 mL/min (A) (by C-G formula based on SCr of 2.47 mg/dL (H)).   Medical History: Past Medical History:  Diagnosis Date  . Cancer ([place]) 2015   left eye  . Cataract   . CKD (chronic kidney disease) stage 3, GFR 30-59 ml/min (HCC)    due to DM  . Dizziness   . Hyperlipidemia   . Hypertension   . Hypertensive cardiovascular disease   . Impaired vision   . Memory loss   . Paroxysmal atrial fibrillation (HCC)   . Presence of permanent cardiac pacemaker   . Retinopathy   . Sick sinus syndrome ([place])   . Type II or unspecified type diabetes mellitus without mention of complication, not stated as uncontrolled    Assessment: 85 y/o F from nursing facility with fever and chest congestion. On warfarin PTA for hx afib. Holding warfarin and starting heparin when INR is <2.   No INR has been drawn yet  Goal of Therapy:  Heparin level 0.3-0.7 units/ml Monitor platelets by anticoagulation protocol: Yes   Plan:  -Awaiting INR  Narda Bonds 11/16/2018,4:48 AM

## 2018-11-16 NOTE — H&P (Addendum)
HISTORY & PHYSICAL  Patient Name: Kylie Sanchez MRN: [id number] DOB: 1925-09-07    ADMISSION DATE:  12/10/2018 DATE OF SERVICE:  11/16/2018  CHIEF COMPLAINT:  Altered mental status   HISTORY OF PRESENT ILLNESS  This 85 y.o. Caucasian female nursing home resident transferred to the [facility] Emergency Department with complaints of altered mental status.  She is a [facility].  At time of clinical interview, the patient is on noninvasive positive pressure ventilatory support.  She is lethargic (albeit arousable to noxious stimuli) and unable to participate in clinical interview.  Review of the chart reveals that the patient is DNR.  She is unable to provide clinical history or review of system systems at this time.  According to available documentation, patient presented from the nursing home with reports of altered mental status.  In the emergency department, the patient was started on cefepime and vancomycin for clinical suspicion of pneumonia.  According to documentation, patient has been treated with epinephrine infusion; however, at the time of clinical interview, no intravenous infusion is currently being administered.  Blood pressure 112/61.  REVIEW OF SYSTEMS This patient is critically ill and cannot provide additional history nor review of systems due to mental status/unconsciousness.   PAST MEDICAL/SURGICAL/SOCIAL/FAMILY HISTORIES   Past Medical History:  Diagnosis Date  . Cancer ([place]) 2015   left eye  . Cataract   . CKD (chronic kidney disease) stage 3, GFR 30-59 ml/min (HCC)    due to DM  . Dizziness   . Hyperlipidemia   . Hypertension   . Hypertensive cardiovascular disease   . Impaired vision   . Memory loss   . Paroxysmal atrial fibrillation (HCC)   . Presence of permanent cardiac pacemaker   . Retinopathy   . Sick sinus syndrome ([place])   . Type II or unspecified type diabetes mellitus without mention of complication, not stated  as uncontrolled     Past Surgical History:  Procedure Laterality Date  . DENTAL SURGERY    . ENDARTERECTOMY Right 05/13/2017   Procedure: REDO ENDARTERECTOMY CAROTID Resection Redundant Internal Corotid Artery.;  Surgeon: Angelia Mould, MD;  Location: [place];  Service: Vascular;  Laterality: Right;  . ENDARTERECTOMY Right 05/13/2017   Procedure: ENDARTERECTOMY CAROTID-RIGHT;  Surgeon: Serafina Mitchell, MD;  Location: [facility] OR;  Service: Vascular;  Laterality: Right;  . EYE SURGERY Left    tumor and eye removed  . INTRAMEDULLARY (IM) NAIL INTERTROCHANTERIC Right 03/23/2018   Procedure: INTRAMEDULLARY (IM) NAIL RIGHT INTERTROCHANTRIC;  Surgeon: Mcarthur Rossetti, MD;  Location: [place];  Service: Orthopedics;  Laterality: Right;  . PACEMAKER INSERTION  04/20/12   MDT Adapta L implanted by Dr Rayann Heman  . PATCH ANGIOPLASTY Right 05/13/2017   Procedure: PATCH ANGIOPLASTY USING Rueben Bash BIOLOGIC PATCH;  Surgeon: Serafina Mitchell, MD;  Location: [facility] OR;  Service: Vascular;  Laterality: Right;  . PERMANENT PACEMAKER INSERTION N/A 04/20/2012   Procedure: PERMANENT PACEMAKER INSERTION;  Surgeon: Thompson Grayer, MD;  Location: [facility] CATH LAB;  Service: Cardiovascular;  Laterality: N/A;    Social History   Tobacco Use  . Smoking status: Never Smoker  . Smokeless tobacco: Never Used  Substance Use Topics  . Alcohol use: No    Family History  Problem Relation Age of Onset  . Heart attack Father   . Heart disease Father   . Hypertension Mother   . Heart disease Mother   . Cancer Other     Allergies  Allergen  Reactions  . No Known Allergies     Prior to Admission medications   Medication Sig Start Date End Date Taking? Authorizing Provider  albuterol (PROVENTIL) (2.5 MG/3ML) 0.083% nebulizer solution Take 2.5 mg by nebulization every 6 (six) hours as needed for wheezing or shortness of breath.   Yes [provider]  Amino Acids-Protein Hydrolys (FEEDING SUPPLEMENT, PRO-STAT SUGAR  FREE 64,) LIQD Take 30 mLs by mouth daily.   Yes [provider]  amiodarone (PACERONE) 200 MG tablet Take amiodarone 2 tabs (400 mg) daily until 04/26/2018.  Then start amiodarone 200 mg daily on 04/27/2018. Patient taking differently: Take 200 mg by mouth daily.  04/15/18  Yes Rai, Ripudeep K, MD  amoxicillin-clavulanate (AUGMENTIN) 875-125 MG tablet Take 1 tablet by mouth 2 (two) times daily.   Yes [provider]  atorvastatin (LIPITOR) 40 MG tablet Take 1 tablet (40 mg total) by mouth daily at 6 PM. 10/28/17  Yes Unk Pinto, MD  b complex vitamins tablet Take 1 tablet by mouth daily.   Yes [provider]  blood glucose meter kit and supplies Test sugars once daily. Dispense based insurance preference. E11.9 11/20/17  Yes Vicie Mutters, PA-C  cetirizine (ZYRTEC) 10 MG tablet Take 10 mg by mouth daily as needed for allergies.   Yes [provider]  cholecalciferol (VITAMIN D) 1000 units tablet Take 1,000 Units by mouth every evening.    Yes [provider]  Coenzyme Q10 (CO Q 10) 100 MG CAPS Take 100 mg by mouth every evening.    Yes [provider]  escitalopram (LEXAPRO) 5 MG tablet Take 1 tablet (5 mg total) by mouth daily. Patient taking differently: Take 15 mg by mouth daily.  04/15/18  Yes Rai, Ripudeep K, MD  furosemide (LASIX) 20 MG tablet Take 1 tablet daily for BP & Fluid Patient taking differently: Take 40 mg by mouth daily.  02/23/18  Yes Unk Pinto, MD  glucose blood test strip Check sugars once to twice a day for diabetes E11.21 11/17/17  Yes Vicie Mutters, PA-C  HYDROcodone-acetaminophen (NORCO/VICODIN) 5-325 MG tablet Take 1-2 tablets by mouth every 4 (four) hours as needed for moderate pain.    Yes [provider]  LORazepam (ATIVAN) 0.5 MG tablet Take 0.25 mg by mouth daily as needed for anxiety.   Yes [provider]  metoprolol tartrate (LOPRESSOR) 25 MG tablet Take 0.5 tablets (12.5 mg total) by  mouth 2 (two) times daily. 04/15/18  Yes Rai, Ripudeep K, MD  MULTIPLE VITAMIN PO Take 1 tablet by mouth daily.   Yes [provider]  multivitamin-lutein (OCUVITE-LUTEIN) CAPS capsule Take 1 capsule by mouth 2 (two) times daily.   Yes [provider]  nitroGLYCERIN (NITROSTAT) 0.4 MG SL tablet Place 1 tablet (0.4 mg total) under the tongue every 5 (five) minutes as needed for chest pain. 10/30/17 01/28/19 Yes Allred, Jeneen Rinks, MD  OLANZapine (ZYPREXA) 7.5 MG tablet Take 7.5 mg by mouth at bedtime.   Yes [provider]  polyethylene glycol (MIRALAX / GLYCOLAX) packet Take 17 g by mouth daily.    Yes [provider]  potassium chloride (K-DUR,KLOR-CON) 10 MEQ tablet Take 1 tablet (10 mEq total) by mouth daily. 04/16/18  Yes Rai, Ripudeep K, MD  PRESCRIPTION MEDICATION Take 8 oz by mouth 2 (two) times daily. MED PASS   Yes [provider]  rOPINIRole (REQUIP) 0.5 MG tablet Take 0.5 mg by mouth at bedtime.   Yes [provider]  sennosides-docusate  sodium (SENOKOT-S) 8.6-50 MG tablet Take 1 tablet by mouth daily.   Yes [provider]  warfarin (COUMADIN) 4 MG tablet Take as directed by [facility] Patient taking differently: Take 1 mg by mouth daily.  10/30/17  Yes Allred, Jeneen Rinks, MD  ipratropium-albuterol (DUONEB) 0.5-2.5 (3) MG/3ML SOLN Take 3 mLs by nebulization every 6 (six) hours as needed. 04/15/18   Rai, Vernelle Emerald, MD    No current facility-administered medications for this encounter.    Current Outpatient Medications  Medication Sig Dispense Refill  . albuterol (PROVENTIL) (2.5 MG/3ML) 0.083% nebulizer solution Take 2.5 mg by nebulization every 6 (six) hours as needed for wheezing or shortness of breath.    . Amino Acids-Protein Hydrolys (FEEDING SUPPLEMENT, PRO-STAT SUGAR FREE 64,) LIQD Take 30 mLs by mouth daily.    Marland Kitchen amiodarone (PACERONE) 200 MG tablet Take amiodarone 2 tabs (400 mg) daily until 04/26/2018.  Then start amiodarone  200 mg daily on 04/27/2018. (Patient taking differently: Take 200 mg by mouth daily. )    . amoxicillin-clavulanate (AUGMENTIN) 875-125 MG tablet Take 1 tablet by mouth 2 (two) times daily.    Marland Kitchen atorvastatin (LIPITOR) 40 MG tablet Take 1 tablet (40 mg total) by mouth daily at 6 PM. 90 tablet 0  . b complex vitamins tablet Take 1 tablet by mouth daily.    . blood glucose meter kit and supplies Test sugars once daily. Dispense based insurance preference. E11.9 1 each 0  . cetirizine (ZYRTEC) 10 MG tablet Take 10 mg by mouth daily as needed for allergies.    . cholecalciferol (VITAMIN D) 1000 units tablet Take 1,000 Units by mouth every evening.     . Coenzyme Q10 (CO Q 10) 100 MG CAPS Take 100 mg by mouth every evening.     . escitalopram (LEXAPRO) 5 MG tablet Take 1 tablet (5 mg total) by mouth daily. (Patient taking differently: Take 15 mg by mouth daily. )    . furosemide (LASIX) 20 MG tablet Take 1 tablet daily for BP & Fluid (Patient taking differently: Take 40 mg by mouth daily. ) 90 tablet 0  . glucose blood test strip Check sugars once to twice a day for diabetes E11.21 150 each 4  . HYDROcodone-acetaminophen (NORCO/VICODIN) 5-325 MG tablet Take 1-2 tablets by mouth every 4 (four) hours as needed for moderate pain.     Marland Kitchen LORazepam (ATIVAN) 0.5 MG tablet Take 0.25 mg by mouth daily as needed for anxiety.    . metoprolol tartrate (LOPRESSOR) 25 MG tablet Take 0.5 tablets (12.5 mg total) by mouth 2 (two) times daily.    . MULTIPLE VITAMIN PO Take 1 tablet by mouth daily.    . multivitamin-lutein (OCUVITE-LUTEIN) CAPS capsule Take 1 capsule by mouth 2 (two) times daily.    . nitroGLYCERIN (NITROSTAT) 0.4 MG SL tablet Place 1 tablet (0.4 mg total) under the tongue every 5 (five) minutes as needed for chest pain. 100 tablet 2  . OLANZapine (ZYPREXA) 7.5 MG tablet Take 7.5 mg by mouth at bedtime.    . polyethylene glycol (MIRALAX / GLYCOLAX) packet Take 17 g by mouth daily.     . potassium chloride  (K-DUR,KLOR-CON) 10 MEQ tablet Take 1 tablet (10 mEq total) by mouth daily.    Marland Kitchen PRESCRIPTION MEDICATION Take 8 oz by mouth 2 (two) times daily. MED PASS    . rOPINIRole (REQUIP) 0.5 MG tablet Take 0.5 mg by mouth at bedtime.    . sennosides-docusate sodium (SENOKOT-S) 8.6-50 MG  tablet Take 1 tablet by mouth daily.    Marland Kitchen warfarin (COUMADIN) 4 MG tablet Take as directed by [facility] (Patient taking differently: Take 1 mg by mouth daily. ) 90 tablet 1  . ipratropium-albuterol (DUONEB) 0.5-2.5 (3) MG/3ML SOLN Take 3 mLs by nebulization every 6 (six) hours as needed. 360 mL     VITAL SIGNS: BP (!) 115/45   Pulse 64   Temp (!) 101.2 F (38.4 C) (Rectal)   Resp (!) 27   Ht 5' 3"  (1.6 m)   Wt 59 kg   SpO2 94%   BMI 23.03 kg/m   VENTILATOR SETTINGS: Vent Mode: BIPAP FiO2 (%):  [40 %-60 %] 40 % Set Rate:  [8 bmp] 8 bmp PEEP:  [6 cmH20] 6 cmH20  INTAKE / OUTPUT: No intake/output data recorded.  PHYSICAL EXAMINATION: GENERAL: lethargic/drowsy, arousable to noxious stimuli, well-developed. No acute distress. HEAD: normocephalic, atraumatic EYE: PERRLA, EOM intact, no scleral icterus, no pallor. NOSE: nares are patent. No polyps. No exudate.  THROAT/ORAL CAVITY: Unable to perform oral cavity examination.  On NIPPV mask during clinical interview. NECK: supple, no thyromegaly, no JVD, no lymphadenopathy. Trachea midline. CHEST/LUNG: symmetric in development and expansion. Good air entry.  Rales and rhonchi bilaterally. HEART: Regular S1 and S2 without murmur, rub or gallop. ABDOMEN: soft, nontender, nondistended. Normoactive bowel sounds. No rebound. No guarding. No hepatosplenomegaly. EXTREMITIES: Edema: Trace. No cyanosis.  No clubbing. 2+ DP pulses LYMPHATIC: no cervical/axillary/inguinal lymph nodes appreciated MUSCULOSKELETAL: No point tenderness.  No bulk atrophy.  SKIN: No rash or lesion.  NEUROLOGIC: Doll's eyes intact. Corneal reflex intact. Spontaneous respirations intact.   Facial symmetry.  Bilateral Babinski's. Motor: 3/5 @ RUE, 3/5 @ LUE, 2/5 @ RLL,  2/5 @ LLL.  DTR: 2+ @ R biceps, 2+ @ L biceps, 2+ @ R patellar,  2+ @ L patellar. No cerebellar signs. Gait was not assessed.   LABS:  BASIC METABOLIC PROFILE Recent Labs  Lab 12/01/2018 2230  NA 134*  K 5.4*  CL 105  CO2 15*  BUN 49*  CREATININE 2.47*  GLUCOSE 230*  CALCIUM 8.2*    Glucose No results for input(s): GLUCAP in the last 168 hours.  Liver Enzymes Recent Labs  Lab 12/06/2018 2230  AST 367*  ALT 236*  ALKPHOS 98  BILITOT 1.0  ALBUMIN 2.7*    CBC Recent Labs  Lab 11/26/2018 2230  WBC 7.4  HGB 8.8*  HCT 32.6*  PLT 188    COAGULATION STUDIES No results for input(s): APTT, INR in the last 168 hours.  SEPSIS MARKERS Recent Labs  Lab 12/06/2018 2248 11/16/18 0038  LATICACIDVEN 3.98* 2.37*    ABG Recent Labs  Lab 11/29/2018 2237  PHART 7.258*  PCO2ART 40.9  PO2ART 145.0*    Cardiac Enzymes No results for input(s): TROPONINI, PROBNP in the last 168 hours.  Imaging Dg Chest Portable 1 View  Result Date: 11/16/2018 CLINICAL DATA:  Hypotension, shortness of breath, fever to 101 degrees, code sepsis EXAM: PORTABLE CHEST 1 VIEW COMPARISON:  Portable exam at 2225 hrs compared to 04/04/2018 FINDINGS: Left subclavian sequential transvenous pacemaker leads project at right atrium and right ventricle. Normal heart size, mediastinal contours, and pulmonary vascularity. Rotated to the LEFT. Atherosclerotic calcification aorta. Patchy infiltrates RIGHT upper and RIGHT lower lobes consistent with pneumonia. Chronic accentuation of LEFT basilar markings. No definite pleural effusion or pneumothorax. Bones demineralized. IMPRESSION: RIGHT lung infiltrates consistent with pneumonia. Electronically Signed   By: Lavonia Dana M.D.   On:  11/17/2018 22:39    STUDIES:  12-lead EKG (12/06/2018 2214): Sinus rhythm with intraventricular conduction delay. 12-lead EKG (11/16/2018 0111): Ventricular  tachycardia (pulseless) 12-lead EKG (11/16/2018 0116): Sinus bradycardia with right bundle branch block  CULTURES: Results for orders placed or performed during the hospital encounter of 04/04/18  Urine culture     Status: Abnormal   Collection Time: 04/04/18  9:07 PM  Result Value Ref Range Status   Specimen Description URINE, RANDOM  Final   Special Requests   Final    NONE Performed at [facility], [place] [address], [place], [place] [address]    Culture 40,000 COLONIES/mL YEAST (A)  Final   Report Status 04/06/2018 FINAL  Final  Blood Culture (routine x 2)     Status: None   Collection Time: 04/04/18  9:27 PM  Result Value Ref Range Status   Specimen Description BLOOD LEFT ARM  Final   Special Requests   Final    BOTTLES DRAWN AEROBIC AND ANAEROBIC Blood Culture adequate volume   Culture   Final    NO GROWTH 5 DAYS Performed at [facility], [place] [address], [place], [place] [address]    Report Status 04/09/2018 FINAL  Final  Blood Culture (routine x 2)     Status: None   Collection Time: 04/05/18  2:10 AM  Result Value Ref Range Status   Specimen Description BLOOD RIGHT ARM  Final   Special Requests   Final    BOTTLES DRAWN AEROBIC ONLY Blood Culture results may not be optimal due to an inadequate volume of blood received in culture bottles   Culture   Final    NO GROWTH 5 DAYS Performed at [facility], [place] [address], [place], [place] [address]    Report Status 04/10/2018 FINAL  Final    ANTIBIOTICS: Augmentin (11/14/2018-12/09/2018) Cefepime/vancomycin (11/16/2018>>  SIGNIFICANT EVENTS: 11/24/2018: Transferred from [place] center with altered mental status and a clinical impression of pneumonia. 11/16/2018: Pulseless ventricular tachycardia in ER.  Spontaneously converted to sinus bradyarrhythmia.   ASSESSMENT / PLAN: Principal Problem:   Sepsis due to pneumonia [facility]) Active Problems:   Acute metabolic encephalopathy   Pulseless ventricular  tachycardia (HCC)   AKI (acute kidney injury) (HCC)   Lactic acidosis   PAF (paroxysmal atrial fibrillation) (HCC)   Type 2 diabetes mellitus with hyperlipidemia (HCC)   Acute combined systolic and diastolic CHF, NYHA class 1 (HCC)   Abnormal LFTs   Macrocytic anemia   By systems:   INFECTIOUS  Sepsis/Pneumonia (healthcare associated) Continue cefepime, vancomycin.  Add Levaquin. Blood cultures. Urine culture. Check urinary strep and Legionella antigens.   PULMONARY  Abnormal CXR: "pneumonia/right upper lobe, right middle lobe"  On NIPPV for respiratory support (insufficient compensation of lactic acidosis) Increase NIPPV rate to 24. Titrate NIPPV support based on ABG results.   CARDIOVASCULAR  Pulseless ventricular tachycardia  Paroxysmal atrial fibrillation, treated with amiodarone and warfarin (home medication)  Hypertension at baseline (furosemide 20 mg p.o. daily, metoprolol tartrate 12.5 mg p.o. twice daily) During the initial clinical encounter, the patient experienced posterior pulseless ventricular tachycardia; however, she did spontaneously convert to sinus bradycardia (the patient is known to have a pacemaker.  No clinical signs that defibrillation occurred).  Check BNP. Check troponin.   RENAL  Acute kidney injury  Lactic acidosis, insufficient compensation IV fluids: Lactated Ringer's 1 L bolus followed by 60 mL/hr   GASTROINTESTINAL  Abnormal LFTs  GI prophylaxis: Famotidine Repeat LFTs in  a.m.   HEMATOLOGIC  Macrocytic anemia  DVT prophylaxis: Heparin gtt (in light of chronic anticoagulation history for atrial fibrillation)  ENDOCRINE  Type 2 diabetes mellitus with peripheral angiopathy (without gangrene) Accu-Cheks every 4 hours and sliding scale insulin   NEUROLOGIC  Encephalopathy, multifactorial (likely, toxic + anoxic)   Admit to ICU under my service (Attending: Renee Pain, MD) with the diagnoses highlighted above in the  active Hospital Problem List (ASSESSMENT).   My assessment, plan of care, findings, medications, side effects, etc. were discussed with: nurse.  FAMILY  - Updates: no family at bedside  - Inter-disciplinary family meet or Palliative Care meeting due by:  11/22/2018  Critical care time: 60 minutes.  The treatment and management of the patient's condition was required based on the threat of imminent deterioration. This time reflects time spent by the physician evaluating, providing care and managing the critically ill patient's care. The time was spent at the immediate bedside (or on the same floor/unit and dedicated to this patient's care). Time involved in separately billable procedures is NOT included int he critical care time indicated above. Family meeting and update time may be included above if and only if the patient is unable/incompetent to participate in clinical interview and/or decision making, and the discussion was necessary to determining treatment decisions.  Renee Pain, MD Board Certified by the ABIM, [place] Pager: [phone]  11/16/2018, 1:48 AM

## 2018-11-16 NOTE — Progress Notes (Signed)
[facility] for Heparin  Indication: atrial fibrillation  Allergies  Allergen Reactions  . No Known Allergies    Patient Measurements: Height: 5\' 3"  (160 cm) Weight: 146 lb 6.2 oz (66.4 kg) IBW/kg (Calculated) : 52.4  Vital Signs: Temp: 98.3 F (36.8 C) (01/06 0211) Temp Source: Axillary (01/06 0211) BP: 131/51 (01/06 0215) Pulse Rate: 102 (01/06 0335)  Labs: Recent Labs    11/13/2018 2230 11/16/18 0429 11/16/18 0608  HGB 8.8*  --  8.7*  HCT 32.6*  --  31.3*  PLT 188  --  192  APTT  --   --  36  LABPROT  --   --  21.1*  INR  --   --  1.85  CREATININE 2.47* 2.44*  --   TROPONINI  --  0.06*  --     Estimated Creatinine Clearance: 13.8 mL/min (A) (by C-G formula based on SCr of 2.44 mg/dL (H)).  Assessment: 85 y.o. female with h/o Afib, Coumadin on hold and INR subtherapeutic, for heparin  Goal of Therapy:  Heparin level 0.3-0.7 units/ml Monitor platelets by anticoagulation protocol: Yes   Plan:  Start heparin 800 units/hr Check heparin level in 8 hours.  Cyriah Childrey, [place] 11/16/2018,7:10 AM

## 2018-11-16 NOTE — Care Plan (Cosign Needed)
Discussed plan of care with nephew as he is her power of attorney in person, he would like to keep same level of care and do not want to withdraw any treatment at this time. No escalation of care from this point onward.  -We will continue current level of care.

## 2018-11-16 NOTE — Progress Notes (Signed)
RT Note:  Venous Blood obtained during attempt for ABG, results given to Dr. Emmit Alexanders.  Verbal order given to d/c morning ABG.

## 2018-11-16 NOTE — Progress Notes (Signed)
CRITICAL VALUE ALERT  Critical Value:  Lactic Acid 4.3 / Troponin 0.06  Date & Time Notied:  11/16/2018 0802  Provider Notified: Dr. Lorella Nimrod, MD.  Orders Received/Actions taken: Continue to trend Lactic Acid and Troponin. 500 mL LR bolus to be administered.

## 2018-11-16 NOTE — Significant Event (Cosign Needed)
Patient had another episode of V. tach which was converted to V. fib and then she developed asystole, initially she was having some agonal breathing and then stop breathing for few minutes.  There was no pulse during that time.  Amiodarone bolus was started, after few minutes patient started pacing and then regained normal sinus rhythm.  She is slowly regain her breathing.  Continue to have very shallow breathing. She was also given a dose of magnesium and calcium.  Talked with nephew who is her power of attorney on the phone, he is on his way.  He do not want to escalate any care.  No ACS meds, he will rather try keeping her comfortable.  Patient appears to have little labored breathing-2 mg of morphine was ordered.  Once nephew arrives we can convert her to full comfort care.

## 2018-11-16 NOTE — Progress Notes (Signed)
Pharmacy Antibiotic Note  Kylie Sanchez is a 85 y.o. female admitted on 11/17/2018 with pneumonia and sepsis.  Pharmacy has been consulted for Vancomycin/Cefepime dosing. WBC WNL. Acute on chronic kidney disease. Lactic acid coming down.   Plan: Vancomycin 1000 mg IV x 1, then give 500 mg IV q36h >>Estimated AUC: 538 Cefepime 1g IV q24h Levaquin per MD (renally adjusted) Trend WBC, temp, renal function  F/U infectious work-up Drug levels as indicated  Height: 5\' 3"  (160 cm) Weight: 130 lb (59 kg) IBW/kg (Calculated) : 52.4  Temp (24hrs), Avg:101.2 F (38.4 C), Min:101.2 F (38.4 C), Max:101.2 F (38.4 C)  Recent Labs  Lab 11/12/2018 2230 11/30/2018 2248 11/16/18 0038  WBC 7.4  --   --   CREATININE 2.47*  --   --   LATICACIDVEN  --  3.98* 2.37*    Estimated Creatinine Clearance: 12.3 mL/min (A) (by C-G formula based on SCr of 2.47 mg/dL (H)).    Allergies  Allergen Reactions  . No Known Allergies     Kylie Sanchez 11/16/2018 1:53 AM

## 2018-11-16 NOTE — ED Notes (Signed)
Attempted to call report

## 2018-11-16 NOTE — Progress Notes (Signed)
Patient noted with small runs of V-tach via telemetry monitoring. At 0400 patient converts into sustained V-tach and later with further review rhythm interpreted as Torsades de Pointes. Patient loses pulse with rhythm but patient is DNR. On call MD notified and updated via bedside E-Link monitoring. Attempt made to obtained ABG but attempt unsuccessful by Respiratory Therapy (venous specimen obtained). Patient spontaneously converts out of V-tach/Torsades and into a slow AV paced rhythm and next back to sinus rhythm. Refer to saved telemetry monitoring strips. While in V-Tach/Torsades pacer spikes noted but patients pacemaker not equipped with automatic defibrillator. Patient regains BP and pulse after converting out of V-tach/Torsades. Order received to administer 2 Amps Bicarb - order carried out. Prior to telemetry event, phlebotomy attempted to obtain lab work including BMP, CBC, INR, Blood Cultures, etc. . but phlebotomist unsuccessful in obtaining lab specimens. Emergently lab work collected via peripheral IV in hand that would draw back. Lab work collected and sent to lab. No further ectopy or telemetry events noted by end of shift. Levophed infusion initiated to assist with meeting BP goals of SBP greater than 90 and MAP greater than 65. Levophed only increased to 3 mcg to meet goals. Patient remains non-purposeful with altered mental status, unable to follow commands, and fluctuations in level of consciousness/RASS from sleeping to restless. Levophed infusion initiated to assist with meeting BP goals of SBP greater than 90 and MAP greater than 65. Levophed only increased to 3 mcg to meet goals.

## 2018-11-16 NOTE — Consult Note (Signed)
[facility]    NEPHROLOGY CONSULTATION NOTE  PATIENT ID:  Kylie Sanchez, DOB:  Jan 03, 1925  HPI: The patient is a 85 y.o. year old female with a past medical history significant for chronic kidney disease stage IV with baseline serum creatinine around 1.86 from June 2019 who presented to the emergency department with altered mental status and hypoxia.  She required bag valve mask ventilation in route to the hospital.  She was unresponsive upon arrival, and has not been able to provide any other associated symptoms.  There is a caregiver at the bedside that states that she has had a cough for several days and has been declining in health.  She has had minimal output since admission.  She has become more acidotic today, prompting renal consultation.   Past Medical History:  Diagnosis Date  . 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 Cataract   . CKD (chronic kidney disease) stage 3, GFR 30-59 ml/min (HCC)    due to DM  . Dizziness   . Hyperlipidemia   . Hypertension   . Hypertensive cardiovascular disease   . Impaired vision   . Memory loss   . Paroxysmal atrial fibrillation (HCC)   . Presence of permanent cardiac pacemaker   . Retinopathy   . Sick sinus syndrome ([place])   . Type II or unspecified type diabetes mellitus without mention of complication, not stated as uncontrolled     Past Surgical History:  Procedure Laterality Date  . DENTAL SURGERY    . ENDARTERECTOMY Right 05/13/2017   Procedure: REDO ENDARTERECTOMY CAROTID Resection Redundant Internal Corotid Artery.;  Surgeon: Angelia Mould, MD;  Location: [place];  Service: Vascular;  Laterality: Right;  . ENDARTERECTOMY Right 05/13/2017   Procedure: ENDARTERECTOMY CAROTID-RIGHT;  Surgeon: Serafina Mitchell, MD;  Location: [facility] OR;  Service: Vascular;  Laterality: Right;  . EYE SURGERY Left    tumor and eye removed  . INTRAMEDULLARY (IM) NAIL INTERTROCHANTERIC Right 03/23/2018   Procedure: INTRAMEDULLARY (IM) NAIL RIGHT  INTERTROCHANTRIC;  Surgeon: Mcarthur Rossetti, MD;  Location: [place];  Service: Orthopedics;  Laterality: Right;  . PACEMAKER INSERTION  04/20/12   MDT Adapta L implanted by Dr Rayann Heman  . PATCH ANGIOPLASTY Right 05/13/2017   Procedure: PATCH ANGIOPLASTY USING Rueben Bash BIOLOGIC PATCH;  Surgeon: Serafina Mitchell, MD;  Location: [facility] OR;  Service: Vascular;  Laterality: Right;  . PERMANENT PACEMAKER INSERTION N/A 04/20/2012   Procedure: PERMANENT PACEMAKER INSERTION;  Surgeon: Thompson Grayer, MD;  Location: [facility] CATH LAB;  Service: Cardiovascular;  Laterality: N/A;    Family History  Problem Relation Age of Onset  . Heart attack Father   . Heart disease Father   . Hypertension Mother   . Heart disease Mother   . Cancer Other     Social History   Tobacco Use  . Smoking status: Never Smoker  . Smokeless tobacco: Never Used  Substance Use Topics  . Alcohol use: No  . Drug use: No    REVIEW OF SYSTEMS: Review of systems unobtainable secondary to unresponsiveness.   PHYSICAL EXAM:  Vitals:   11/16/18 1200 11/16/18 1300  BP: (!) 98/58 (!) 99/41  Pulse: (!) 109 77  Resp: (!) 21 17  Temp: 98.8 F (37.1 C) 98.4 F (36.9 C)  SpO2: 92% 100%   I/O last 3 completed shifts: In: 5046.5 [I.V.:2496.5; IV Piggyback:2550] Out: 0    General: Minimally responsive NAD HEENT: MMM [place] AT anicteric sclera Neck: Positive JVD, no adenopathy  CV:  Heart RRR  Lungs: Lung sounds with bilateral rales and rhonchi Abd:  abd SNT/ND with normal BS GU:  Bladder non-palpable Extremities: +1 bilateral lower extremity edema Skin:  No skin rash Psych: Minimally responsive Neuro:  no focal deficits  MEDICATIONS:   Current Facility-Administered Medications:  .  amiodarone (NEXTERONE PREMIX) 360-4.14 MG/200ML-% (1.8 mg/mL) IV infusion, , , ,  .  0.9 %  sodium chloride infusion, 250 mL, Intravenous, Continuous, Carson Myrtle, Seong-Joo, MD .  albuterol (PROVENTIL) (2.5 MG/3ML) 0.083% nebulizer solution 2.5 mg, 2.5  mg, Nebulization, Q4H, Renee Pain, MD, 2.5 mg at 11/16/18 1147 .  ceFEPIme (MAXIPIME) 1 g in sodium chloride 0.9 % 100 mL IVPB, 1 g, Intravenous, Q24H, Erenest Blank, RPH .  chlorhexidine (PERIDEX) 0.12 % solution 15 mL, 15 mL, Mouth Rinse, BID, Renee Pain, MD, 15 mL at 11/16/18 1200 .  famotidine (PEPCID) IVPB 20 mg premix, 20 mg, Intravenous, Daily, Renee Pain, MD, Stopped at 11/16/18 1029 .  heparin ADULT infusion 100 units/mL (25000 units/236mL sodium chloride 0.45%), 800 Units/hr, Intravenous, Continuous, Renee Pain, MD, Last Rate: 8 mL/hr at 11/16/18 1100, 800 Units/hr at 11/16/18 1100 .  insulin aspart (novoLOG) injection 0-9 Units, 0-9 Units, Subcutaneous, Q4H, Renee Pain, MD, 2 Units at 11/16/18 [phone] .  lactated ringers infusion, , Intravenous, Continuous, Renee Pain, MD, Last Rate: 500 mL/hr at 11/16/18 0801 .  MEDLINE mouth rinse, 15 mL, Mouth Rinse, q12n4p, Renee Pain, MD .  norepinephrine (LEVOPHED) 4 mg in dextrose 5 % 250 mL (0.016 mg/mL) infusion, 0-40 mcg/min, Intravenous, Titrated, Renee Pain, MD, Last Rate: 3.75 mL/hr at 11/16/18 1100, 1 mcg/min at 11/16/18 1100 .  ondansetron (ZOFRAN) injection 4 mg, 4 mg, Intravenous, Q6H PRN, Renee Pain, MD .  sodium bicarbonate 150 mEq in dextrose 5 % 1,000 mL infusion, , Intravenous, Continuous, Amin, Soundra Pilon, MD, Last Rate: 75 mL/hr at 11/16/18 1248 .  [START ON 11/17/2018] vancomycin (VANCOCIN) 500 mg in sodium chloride 0.9 % 100 mL IVPB, 500 mg, Intravenous, Q36H, Erenest Blank, RPH     LABS:  CBC Latest Ref Rng & Units 11/16/2018 11/12/2018 04/15/2018  WBC 4.0 - 10.5 K/uL 7.6 7.4 6.5  Hemoglobin 12.0 - 15.0 g/dL 8.7(L) 8.8(L) 11.7(L)  Hematocrit 36.0 - 46.0 % 31.3(L) 32.6(L) 36.7  Platelets 150 - 400 K/uL 192 188 434(H)    CMP Latest Ref Rng & Units 11/16/2018 11/16/2018 11/20/2018  Glucose 70 - 99 mg/dL 236(H) 256(H) 230(H)  BUN 8 - 23 mg/dL 49(H) 47(H) 49(H)  Creatinine 0.44 - 1.00  mg/dL 2.63(H) 2.44(H) 2.47(H)  Sodium 135 - 145 mmol/L 137 134(L) 134(L)  Potassium 3.5 - 5.1 mmol/L 4.7 4.8 5.4(H)  Chloride 98 - 111 mmol/L 109 107 105  CO2 22 - 32 mmol/L 12(L) 12(L) 15(L)  Calcium 8.9 - 10.3 mg/dL 7.6(L) 7.7(L) 8.2(L)  Total Protein 6.5 - 8.1 g/dL - 5.9(L) 6.4(L)  Total Bilirubin 0.3 - 1.2 mg/dL - 1.0 1.0  Alkaline Phos 38 - 126 U/L - 120 98  AST 15 - 41 U/L - 809(H) 367(H)  ALT 0 - 44 U/L - 431(H) 236(H)    Lab Results  Component Value Date   CALCIUM 7.6 (L) 11/16/2018   PHOS 5.2 (H) 11/16/2018       Component Value Date/Time   COLORURINE AMBER (A) 11/16/2018 0145   APPEARANCEUR HAZY (A) 11/16/2018 0145   LABSPEC 1.019 11/16/2018 0145   PHURINE 5.0 11/16/2018 0145   GLUCOSEU 50 (A) 11/16/2018 0145   HGBUR  NEGATIVE 11/16/2018 0145   BILIRUBINUR NEGATIVE 11/16/2018 0145   KETONESUR 5 (A) 11/16/2018 0145   PROTEINUR 100 (A) 11/16/2018 0145   UROBILINOGEN 0.2 02/10/2015 1034   NITRITE NEGATIVE 11/16/2018 0145   LEUKOCYTESUR NEGATIVE 11/16/2018 0145      Component Value Date/Time   PHART 7.151 (LL) 11/16/2018 0413   PCO2ART 45.3 11/16/2018 0413   PO2ART 44.0 (L) 11/16/2018 0413   HCO3 15.8 (L) 11/16/2018 0413   TCO2 17 (L) 11/16/2018 0413   ACIDBASEDEF 12.0 (H) 11/16/2018 0413   O2SAT 65.0 11/16/2018 0413       Component Value Date/Time   IRON 38 04/06/2018 1950   TIBC 197 (L) 04/06/2018 1950   FERRITIN 221 04/06/2018 1950   IRONPCTSAT 19 04/06/2018 1950       ASSESSMENT/PLAN:     Problem List Items Addressed This Visit    None    Visit Diagnoses    Sepsis with acute renal failure, due to unspecified organism, unspecified acute renal failure type, unspecified whether septic shock present ([place])    -  Primary   Relevant Medications   vancomycin (VANCOCIN) IVPB 1000 mg/200 mL premix (Completed)   ceFEPIme (MAXIPIME) 2 g in sodium chloride 0.9 % 100 mL IVPB (Completed)   ceFEPIme (MAXIPIME) 1 g in sodium chloride 0.9 % 100 mL IVPB (Start on  11/16/2018 10:00 PM)   vancomycin (VANCOCIN) 500 mg in sodium chloride 0.9 % 100 mL IVPB (Start on 11/17/2018 10:00 AM)   Hepatitis       Metabolic acidosis       Pneumonia due to infectious organism, unspecified laterality, unspecified part of lung       Relevant Medications   vancomycin (VANCOCIN) IVPB 1000 mg/200 mL premix (Completed)   ceFEPIme (MAXIPIME) 2 g in sodium chloride 0.9 % 100 mL IVPB (Completed)   amoxicillin-clavulanate (AUGMENTIN) 875-125 MG tablet   cetirizine (ZYRTEC) 10 MG tablet   albuterol (PROVENTIL) (2.5 MG/3ML) 0.083% nebulizer solution 2.5 mg   levofloxacin (LEVAQUIN) IVPB 500 mg (Completed)   ceFEPIme (MAXIPIME) 1 g in sodium chloride 0.9 % 100 mL IVPB (Start on 11/16/2018 10:00 PM)   vancomycin (VANCOCIN) 500 mg in sodium chloride 0.9 % 100 mL IVPB (Start on 11/17/2018 10:00 AM)      1.  Chronic kidney disease stage IV with a baseline serum creatinine around 1.86.  Likely on the basis of longstanding diabetes and atherosclerotic cardiovascular disease.  2.  Acute kidney injury.  Likely secondary to shock.  Has had minimal urine output.  Is more acidotic.  Lengthy discussion with the nephew, Barnabas Lister, who reported the patient would not want dialysis.  He is aware that her clinical status could deteriorate, and is interested in treating her with medications and palliating.  3.  Pneumonia.  Antibiotics per primary team  4.  Anion gap and non-anion gap metabolic acidosis.  Likely secondary to acute kidney injury and lactic acidosis.  Continue lactated Ringer's.  Watch volume status closely.  5.  Shock liver.  Likely secondary to hypotension.  Continue supportive care.      [place], DO, [place]

## 2018-11-16 NOTE — ED Notes (Signed)
Dr Tomi Bamberger informed of lactic acid results 2.37

## 2018-11-16 NOTE — Progress Notes (Signed)
[place] for Heparin  Indication: atrial fibrillation  Allergies  Allergen Reactions  . No Known Allergies    Patient Measurements: Height: 5\' 3"  (160 cm) Weight: 146 lb 6.2 oz (66.4 kg) IBW/kg (Calculated) : 52.4  Vital Signs: Temp: 99 F (37.2 C) (01/06 1700) Temp Source: Other (Comment) (01/06 0700) BP: 130/55 (01/06 1700) Pulse Rate: 66 (01/06 1700)  Labs: Recent Labs    12/11/2018 2230 11/16/18 0429 11/16/18 0608 11/16/18 1314 11/16/18 1627  HGB 8.8*  --  8.7*  --   --   HCT 32.6*  --  31.3*  --   --   PLT 188  --  192  --   --   APTT  --   --  36  --   --   LABPROT  --   --  21.1*  --   --   INR  --   --  1.85  --   --   HEPARINUNFRC  --   --   --   --  0.22*  CREATININE 2.47* 2.44* 2.63* 2.76*  --   TROPONINI  --  0.06* 0.05* 0.05*  --     Estimated Creatinine Clearance: 12.2 mL/min (A) (by C-G formula based on SCr of 2.76 mg/dL (H)).  Assessment: 85 y.o. female with h/o Afib, Coumadin on hold and INR subtherapeutic- heparin was started.  Initial heparin level came back subtherapeutic at 0.22, on 800 units/hr. Hgb 8.7, plt 192. No s/sx of bleeding. No infusion issues. Patient did have an episode of asystole/Vtachy this afternoon - no further escalation in care. No infusion issues or s/sx of bleeding per nursing.   Goal of Therapy:  Heparin level 0.3-0.7 units/ml Monitor platelets by anticoagulation protocol: Yes   Plan:  Increase heparin infusion to 950 units/hr Monitor daily HL, CBC, and for s/sx of bleeding  Antonietta Jewel, PharmD, [place] Clinical Pharmacist  Pager: [phone] Phone: [phone] 11/16/2018,6:07 PM

## 2018-11-17 ENCOUNTER — Inpatient Hospital Stay (HOSPITAL_COMMUNITY): Payer: Medicare Other

## 2018-11-17 LAB — GLUCOSE, CAPILLARY
GLUCOSE-CAPILLARY: 177 mg/dL — AB (ref 70–99)
Glucose-Capillary: 174 mg/dL — ABNORMAL HIGH (ref 70–99)
Glucose-Capillary: 200 mg/dL — ABNORMAL HIGH (ref 70–99)
Glucose-Capillary: 142 mg/dL — ABNORMAL HIGH (ref 70–99)
Glucose-Capillary: 175 mg/dL — ABNORMAL HIGH (ref 70–99)

## 2018-11-17 LAB — BASIC METABOLIC PANEL
ANION GAP: 13 (ref 5–15)
BUN: 53 mg/dL — ABNORMAL HIGH (ref 8–23)
CO2: 23 mmol/L (ref 22–32)
Calcium: 8.4 mg/dL — ABNORMAL LOW (ref 8.9–10.3)
Chloride: 103 mmol/L (ref 98–111)
Creatinine, Ser: 3.02 mg/dL — ABNORMAL HIGH (ref 0.44–1.00)
GFR calc Af Amer: 15 mL/min — ABNORMAL LOW (ref >60)
GFR calc non Af Amer: 13 mL/min — ABNORMAL LOW (ref >60)
Glucose, Bld: 191 mg/dL — ABNORMAL HIGH (ref 70–99)
POTASSIUM: 4.2 mmol/L (ref 3.5–5.1)
Sodium: 139 mmol/L (ref 135–145)

## 2018-11-17 LAB — LEGIONELLA PNEUMOPHILA SEROGP 1 UR AG: L. pneumophila Serogp 1 Ur Ag: NEGATIVE

## 2018-11-17 LAB — CBC
HCT: 25.2 % — ABNORMAL LOW (ref 36.0–46.0)
HEMOGLOBIN: 7.6 g/dL — AB (ref 12.0–15.0)
MCH: 28 pg (ref 26.0–34.0)
MCHC: 30.2 g/dL (ref 30.0–36.0)
MCV: 93 fL (ref 80.0–100.0)
Platelets: 185 10*3/uL (ref 150–400)
RBC: 2.71 MIL/uL — ABNORMAL LOW (ref 3.87–5.11)
RDW: 16.8 % — ABNORMAL HIGH (ref 11.5–15.5)
WBC: 5.7 10*3/uL (ref 4.0–10.5)
nRBC: 0 % (ref 0.0–0.2)

## 2018-11-17 LAB — URINE CULTURE: Culture: NO GROWTH

## 2018-11-17 LAB — TROPONIN I: Troponin I: 0.05 ng/mL (ref <0.03)

## 2018-11-17 LAB — LACTIC ACID, PLASMA
Lactic Acid, Venous: 2.3 mmol/L (ref 0.5–1.9)
Lactic Acid, Venous: 2.5 mmol/L (ref 0.5–1.9)

## 2018-11-17 LAB — HEPARIN LEVEL (UNFRACTIONATED): Heparin Unfractionated: 0.39 IU/mL (ref 0.30–0.70)

## 2018-11-17 MED ORDER — ALBUTEROL SULFATE (2.5 MG/3ML) 0.083% IN NEBU
2.5000 mg | INHALATION_SOLUTION | Freq: Three times a day (TID) | RESPIRATORY_TRACT | Status: DC
  Administered 2018-11-18 (×3): 2.5 mg via RESPIRATORY_TRACT
  Filled 2018-11-17 (×3): qty 3

## 2018-11-17 MED ORDER — LORAZEPAM 2 MG/ML IJ SOLN
1.0000 mg | INTRAMUSCULAR | Status: DC | PRN
  Filled 2018-11-17: qty 1

## 2018-11-17 MED ORDER — MUPIROCIN 2 % EX OINT
TOPICAL_OINTMENT | Freq: Two times a day (BID) | CUTANEOUS | Status: DC
  Administered 2018-11-17: 1 via NASAL
  Administered 2018-11-17 – 2018-11-19 (×4): via NASAL
  Filled 2018-11-17 (×3): qty 22

## 2018-11-17 NOTE — Evaluation (Signed)
Clinical/Bedside Swallow Evaluation Patient Details  Name: Kylie Sanchez MRN: [id number] Date of Birth: [birth year]  Today's Date: 11/17/2018 Time: SLP Start Time (ACUTE ONLY): 4098 SLP Stop Time (ACUTE ONLY): 1507 SLP Time Calculation (min) (ACUTE ONLY): 25 min  Past Medical History:  Past Medical History:  Diagnosis Date  . 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 Cataract   . CKD (chronic kidney disease) stage 3, GFR 30-59 ml/min (HCC)    due to DM  . Dizziness   . Hyperlipidemia   . Hypertension   . Hypertensive cardiovascular disease   . Impaired vision   . Memory loss   . Paroxysmal atrial fibrillation (HCC)   . Presence of permanent cardiac pacemaker   . Retinopathy   . Sick sinus syndrome ([place])   . Type II or unspecified type diabetes mellitus without mention of complication, not stated as uncontrolled    Past Surgical History:  Past Surgical History:  Procedure Laterality Date  . DENTAL SURGERY    . ENDARTERECTOMY Right 05/13/2017   Procedure: REDO ENDARTERECTOMY CAROTID Resection Redundant Internal Corotid Artery.;  Surgeon: Angelia Mould, MD;  Location: [place];  Service: Vascular;  Laterality: Right;  . ENDARTERECTOMY Right 05/13/2017   Procedure: ENDARTERECTOMY CAROTID-RIGHT;  Surgeon: Serafina Mitchell, MD;  Location: [facility] OR;  Service: Vascular;  Laterality: Right;  . EYE SURGERY Left    tumor and eye removed  . INTRAMEDULLARY (IM) NAIL INTERTROCHANTERIC Right 03/23/2018   Procedure: INTRAMEDULLARY (IM) NAIL RIGHT INTERTROCHANTRIC;  Surgeon: Mcarthur Rossetti, MD;  Location: [place];  Service: Orthopedics;  Laterality: Right;  . PACEMAKER INSERTION  04/20/12   MDT Adapta L implanted by Dr Rayann Heman  . PATCH ANGIOPLASTY Right 05/13/2017   Procedure: PATCH ANGIOPLASTY USING Rueben Bash BIOLOGIC PATCH;  Surgeon: Serafina Mitchell, MD;  Location: [facility] OR;  Service: Vascular;  Laterality: Right;  . PERMANENT PACEMAKER INSERTION N/A 04/20/2012   Procedure: PERMANENT PACEMAKER INSERTION;   Surgeon: Thompson Grayer, MD;  Location[facility] C[facility] LAB;  Service: Cardiovascular;  Laterality: N/A;   HPI:  Pt is a 85 yo female who presents from SNF with AMS and sepsis. CXR showed infiltrates on the R side consistent with PNA. Hospital course complicated by pulseless ventricular tachycardia, renal failure and profound metabolic acidosis. BSE July 2018 recommending Dys 1 diet, nectar thick liquids post-procedure with MBS completed prior to discharge recommending advancement to regular diet, thin liquids. At that time pt had frequent throat clear and the appearance of esophageal stasis/dysmotility. More recent clinical assessment in May 2019 recommended Dys 1 diet per pt preference. PMH also includes: DM, sick sinus synfrome, afib, memory loss, HTN, HLD, CKD, cancer of the L eye   Assessment / Plan / Recommendation Clinical Impression  Pt presents with signs of dysphagia with suspected aspiration, given oral holding and immediate coughing. Coughing is most consistently observed wtih thin liquids, even when administered in very small increments via swab. SLP discussed results with pt's nephew/POA, who is not interested in additional swallow testing or any modified consistencies. He acknowledged the risk for aspiration and is accepting of it in order to provide comfort. Will allow purees and thin liquids in small boluses at a time for comfort; MD in agreement with plan. Education was provided to caregiver present about feeding methods to reduce (but not eliminate) the risk of aspiration. Would also emphasize the importance of oral hygiene. SLP to sign off for now. Please reorder if needed. SLP Visit Diagnosis: Dysphagia, unspecified (R13.10)  Aspiration Risk  Moderate aspiration risk;Severe aspiration risk;Risk for inadequate nutrition/hydration    Diet Recommendation Dysphagia 1 (Puree);Thin liquid;Other (Comment)(for comfort)   Liquid Administration via: Other (Comment)(swab) Medication Administration:  Crushed with puree Supervision: Staff to assist with self feeding;Full supervision/cueing for compensatory strategies Compensations: Slow rate;Small sips/bites Postural Changes: Seated upright at 90 degrees;Remain upright for at least 30 minutes after po intake    Other  Recommendations Oral Care Recommendations: Oral care QID   Follow up Recommendations None      Frequency and Duration            Prognosis Prognosis for Safe Diet Advancement: Guarded Barriers to Reach Goals: Other (Comment)(medical prognosis)      Swallow Study   General Date of Onset: 11/16/18 HPI: Pt is a 85 yo female who presents from SNF with AMS and sepsis. CXR showed infiltrates on the R side consistent with PNA. Hospital course complicated by pulseless ventricular tachycardia, renal failure and profound metabolic acidosis. BSE July 2018 recommending Dys 1 diet, nectar thick liquids post-procedure with MBS completed prior to discharge recommending advancement to regular diet, thin liquids. At that time pt had frequent throat clear and the appearance of esophageal stasis/dysmotility. More recent clinical assessment in May 2019 recommended Dys 1 diet per pt preference. PMH also includes: DM, sick sinus synfrome, afib, memory loss, HTN, HLD, CKD, cancer of the L eye Type of Study: Bedside Swallow Evaluation Previous Swallow Assessment: see HPI Diet Prior to this Study: NPO Temperature Spikes Noted: No Respiratory Status: Nasal cannula History of Recent Intubation: No Behavior/Cognition: Alert;Cooperative Oral Cavity Assessment: Other (comment)(UTA - pt would not open her mouth for SLP) Oral Care Completed by SLP: No Oral Cavity - Dentition: Other (Comment)(UTA) Self-Feeding Abilities: Total assist Patient Positioning: Upright in bed Baseline Vocal Quality: Normal Volitional Cough: (not tested) Volitional Swallow: (not tested)    Oral/Motor/Sensory Function Overall Oral Motor/Sensory Function: Other  (comment)(pt not participating in testing but appears functional)   Ice Chips Ice chips: Not tested   Thin Liquid Thin Liquid: Impaired Presentation: (swab) Oral Phase Functional Implications: Oral holding Pharyngeal  Phase Impairments: Cough - Immediate    Nectar Thick Nectar Thick Liquid: Not tested   Honey Thick Honey Thick Liquid: Not tested   Puree Puree: Impaired Presentation: Spoon Pharyngeal Phase Impairments: Cough - Delayed   Solid     Solid: Not tested      Germain Osgood 11/17/2018,4:33 PM  Germain Osgood, M.A. [employer] Acute Environmental education officer [phone] Office [phone]

## 2018-11-17 NOTE — Progress Notes (Signed)
[facility] Progress Note    Assessment/ Plan:   85 y.o. year old female with a past medical history significant for chronic kidney disease stage IV with baseline serum creatinine around 1.86 from June 2019 who presented to the emergency department with altered mental status and hypoxia.  She required bag valve mask ventilation in route to the hospital.  She was unresponsive upon arrival, and has not been able to provide any other associated symptoms.  She has had a cough for several days and declining health.  She has had minimal output since admission.   1.  Chronic kidney disease stage IV with a baseline serum creatinine around 1.86.  Likely on the basis of longstanding diabetes and atherosclerotic cardiovascular disease.  2.  Acute kidney injury.  Likely secondary to shock and in ATN.  Has had minimal urine output.  Jone Baseman, reported the patient would not want dialysis.  He is aware that her clinical status could deteriorate, and is interested in treating her with medications and palliating. Acidosis resolved with HCO3 administration.  - She is positive 8.4L during this hospitalization. Agree with palliative discussions as she has multiorgan failure and renal function will likely continue to trend higher + not a dialysis candidate.  BP also very soft for diuresis and associated venodilation.  3.  Pneumonia.  Antibiotics per primary team  4.  Anion gap and non-anion gap metabolic acidosis.  Likely secondary to acute kidney injury and lactic acidosis.    5.  Shock liver.  Likely secondary to hypotension.  Continue supportive care.  Subjective:   She feels her breathing is getting more difficult.   Objective:   BP (!) 91/52   Pulse (!) 133   Temp 100.2 F (37.9 C)   Resp 20   Ht 5\' 3"  (1.6 m)   Wt 66.4 kg   SpO2 92%   BMI 25.93 kg/m   Intake/Output Summary (Last 24 hours) at 11/17/2018 1405 Last data filed at 11/17/2018 0700 Gross per 24 hour  Intake  2614.23 ml  Output 250 ml  Net 2364.23 ml   Weight change:   Physical Exam: General: Tachypneic HEENT: MMM [place] AT anicteric sclera Neck: Positive JVD, no adenopathy CV:  Heart RRR  Lungs: Lung sounds with bilateral rales and rhonchi Abd:  abd SNT/ND with normal BS Extremities: +1 bilateral lower extremity edema Skin:  No skin rash Neuro:  no focal deficits  Imaging: Dg Chest Portable 1 View  Result Date: 11/14/2018 CLINICAL DATA:  Hypotension, shortness of breath, fever to 101 degrees, code sepsis EXAM: PORTABLE CHEST 1 VIEW COMPARISON:  Portable exam at 2225 hrs compared to 04/04/2018 FINDINGS: Left subclavian sequential transvenous pacemaker leads project at right atrium and right ventricle. Normal heart size, mediastinal contours, and pulmonary vascularity. Rotated to the LEFT. Atherosclerotic calcification aorta. Patchy infiltrates RIGHT upper and RIGHT lower lobes consistent with pneumonia. Chronic accentuation of LEFT basilar markings. No definite pleural effusion or pneumothorax. Bones demineralized. IMPRESSION: RIGHT lung infiltrates consistent with pneumonia. Electronically Signed   By: Lavonia Dana M.D.   On: 12/02/2018 22:39    Labs: BMET Recent Labs  Lab 11/17/2018 2230 11/16/18 0429 11/16/18 0608 11/16/18 1314 11/17/18 0815  NA 134* 134* 137 140 139  K 5.4* 4.8 4.7 4.3 4.2  CL 105 107 109 108 103  CO2 15* 12* 12* 15* 23  GLUCOSE 230* 256* 236* 126* 191*  BUN 49* 47* 49* 52* 53*  CREATININE 2.47* 2.44* 2.63* 2.76* 3.02*  CALCIUM  8.2* 7.7* 7.6* 7.8* 8.4*  PHOS  --  5.2* 5.2*  --   --    CBC Recent Labs  Lab 11/19/2018 2230 11/16/18 0608 11/17/18 0405  WBC 7.4 7.6 5.7  NEUTROABS 6.1  --   --   HGB 8.8* 8.7* 7.6*  HCT 32.6* 31.3* 25.2*  MCV 100.0 98.1 93.0  PLT 188 192 185    Medications:    . albuterol  2.5 mg Nebulization Q4H  . chlorhexidine  15 mL Mouth Rinse BID  . insulin aspart  0-9 Units Subcutaneous Q4H  . mouth rinse  15 mL Mouth Rinse q12n4p   .  morphine injection  2 mg Intravenous Once  . mupirocin ointment   Nasal BID      Otelia Santee, MD 11/17/2018, 2:05 PM

## 2018-11-17 NOTE — Progress Notes (Signed)
RN placed patient off bipap and on to 6L . Patient is tolerating well at this time. Will continue to monitor.

## 2018-11-17 NOTE — Progress Notes (Addendum)
Patient Name: Kylie Sanchez MRN: [id number] DOB: 09/06/1925    ADMISSION DATE:  11/27/2018 DATE OF SERVICE:  11/17/2018  CHIEF COMPLAINT:  Altered mental status   HISTORY OF PRESENT ILLNESS  This 85 y.o. Caucasian female nursing home resident transferred to the [facility] Emergency Department with complaints of altered mental status.  She [facility] a [facility].  At time of clinical interview, the patient is on noninvasive positive pressure ventilatory support.  She is lethargic (albeit arousable to noxious stimuli) and unable to participate in clinical interview.  Review of the chart reveals that the patient is DNR.  She is unable to provide clinical history or review of system systems at this time.  According to available documentation, patient presented from the nursing home with reports of altered mental status.  In the emergency department, the patient was started on cefepime and vancomycin for clinical suspicion of pneumonia.  According to documentation, patient has been treated with epinephrine infusion; however, at the time of clinical interview, no intravenous infusion is currently being administered.  Blood pressure 112/61.   PAST MEDICAL/SURGICAL/SOCIAL/FAMILY HISTORIES   Past Medical History:  Diagnosis Date  . Cancer ([place]) 2015   left eye  . Cataract   . CKD (chronic kidney disease) stage 3, GFR 30-59 ml/min (HCC)    due to DM  . Dizziness   . Hyperlipidemia   . Hypertension   . Hypertensive cardiovascular disease   . Impaired vision   . Memory loss   . Paroxysmal atrial fibrillation (HCC)   . Presence of permanent cardiac pacemaker   . Retinopathy   . Sick sinus syndrome ([place])   . Type II or unspecified type diabetes mellitus without mention of complication, not stated as uncontrolled     Past Surgical History:  Procedure Laterality Date  . DENTAL SURGERY    . ENDARTERECTOMY Right 05/13/2017   Procedure: REDO ENDARTERECTOMY CAROTID  Resection Redundant Internal Corotid Artery.;  Surgeon: Angelia Mould, MD;  Location: [place];  Service: Vascular;  Laterality: Right;  . ENDARTERECTOMY Right 05/13/2017   Procedure: ENDARTERECTOMY CAROTID-RIGHT;  Surgeon: Serafina Mitchell, MD;  Location: [place] [facility];  Service: Vascular;  Laterality: Right;  . EYE SURGERY Left    tumor and eye removed  . INTRAMEDULLARY (IM) NAIL INTERTROCHANTERIC Right 03/23/2018   Procedure: INTRAMEDULLARY (IM) NAIL RIGHT INTERTROCHANTRIC;  Surgeon: Mcarthur Rossetti, MD;  Location: [place];  Service: Orthopedics;  Laterality: Right;  . PACEMAKER INSERTION  04/20/12   MDT Adapta L implanted by Dr Rayann Heman  . PATCH ANGIOPLASTY Right 05/13/2017   Procedure: PATCH ANGIOPLASTY USING Rueben Bash BIOLOGIC PATCH;  Surgeon: Serafina Mitchell, MD;  Location: [facility];  Service: Vascular;  Laterality: Right;  . PERMANENT PACEMAKER INSERTION N/A 04/20/2012   Procedure: PERMANENT PACEMAKER INSERTION;  Surgeon: Thompson Grayer, MD;  Location: [facility] CATH LAB;  Service: Cardiovascular;  Laterality: N/A;    Social History   Tobacco Use  . Smoking status: Never Smoker  . Smokeless tobacco: Never Used  Substance Use Topics  . Alcohol use: No    Family History  Problem Relation Age of Onset  . Heart attack Father   . Heart disease Father   . Hypertension Mother   . Heart disease Mother   . Cancer Other     Allergies  Allergen Reactions  . No Known Allergies     Prior to Admission medications   Medication Sig Start Date End Date Taking? Authorizing Provider  albuterol (PROVENTIL) (2.5 MG/3ML) 0.083% nebulizer solution Take 2.5 mg by nebulization every 6 (six) hours as needed for wheezing or shortness of breath.   Yes [provider]  Amino Acids-Protein Hydrolys (FEEDING SUPPLEMENT, PRO-STAT SUGAR FREE 64,) LIQD Take 30 mLs by mouth daily.   Yes [provider]  amiodarone (PACERONE) 200 MG tablet Take amiodarone 2 tabs (400 mg) daily until 04/26/2018.  Then  start amiodarone 200 mg daily on 04/27/2018. Patient taking differently: Take 200 mg by mouth daily.  04/15/18  Yes Rai, Ripudeep K, MD  amoxicillin-clavulanate (AUGMENTIN) 875-125 MG tablet Take 1 tablet by mouth 2 (two) times daily.   Yes [provider]  atorvastatin (LIPITOR) 40 MG tablet Take 1 tablet (40 mg total) by mouth daily at 6 PM. 10/28/17  Yes Unk Pinto, MD  b complex vitamins tablet Take 1 tablet by mouth daily.   Yes [provider]  blood glucose meter kit and supplies Test sugars once daily. Dispense based insurance preference. E11.9 11/20/17  Yes Vicie Mutters, PA-C  cetirizine (ZYRTEC) 10 MG tablet Take 10 mg by mouth daily as needed for allergies.   Yes [provider]  cholecalciferol (VITAMIN D) 1000 units tablet Take 1,000 Units by mouth every evening.    Yes [provider]  Coenzyme Q10 (CO Q 10) 100 MG CAPS Take 100 mg by mouth every evening.    Yes [provider]  escitalopram (LEXAPRO) 5 MG tablet Take 1 tablet (5 mg total) by mouth daily. Patient taking differently: Take 15 mg by mouth daily.  04/15/18  Yes Rai, Ripudeep K, MD  furosemide (LASIX) 20 MG tablet Take 1 tablet daily for BP & Fluid Patient taking differently: Take 40 mg by mouth daily.  02/23/18  Yes Unk Pinto, MD  glucose blood test strip Check sugars once to twice a day for diabetes E11.21 11/17/17  Yes Vicie Mutters, PA-C  HYDROcodone-acetaminophen (NORCO/VICODIN) 5-325 MG tablet Take 1-2 tablets by mouth every 4 (four) hours as needed for moderate pain.    Yes [provider]  LORazepam (ATIVAN) 0.5 MG tablet Take 0.25 mg by mouth daily as needed for anxiety.   Yes [provider]  metoprolol tartrate (LOPRESSOR) 25 MG tablet Take 0.5 tablets (12.5 mg total) by mouth 2 (two) times daily. 04/15/18  Yes Rai, Ripudeep K, MD  MULTIPLE VITAMIN PO Take 1 tablet by mouth daily.   Yes [provider]  multivitamin-lutein  (OCUVITE-LUTEIN) CAPS capsule Take 1 capsule by mouth 2 (two) times daily.   Yes [provider]  nitroGLYCERIN (NITROSTAT) 0.4 MG SL tablet Place 1 tablet (0.4 mg total) under the tongue every 5 (five) minutes as needed for chest pain. 10/30/17 01/28/19 Yes Allred, Jeneen Rinks, MD  OLANZapine (ZYPREXA) 7.5 MG tablet Take 7.5 mg by mouth at bedtime.   Yes [provider]  polyethylene glycol (MIRALAX / GLYCOLAX) packet Take 17 g by mouth daily.    Yes [provider]  potassium chloride (K-DUR,KLOR-CON) 10 MEQ tablet Take 1 tablet (10 mEq total) by mouth daily. 04/16/18  Yes Rai, Ripudeep K, MD  PRESCRIPTION MEDICATION Take 8 oz by mouth 2 (two) times daily. MED PASS   Yes [provider]  rOPINIRole (REQUIP) 0.5 MG tablet Take 0.5 mg by mouth at bedtime.   Yes [provider]  sennosides-docusate sodium (SENOKOT-S) 8.6-50 MG tablet Take 1 tablet by mouth daily.   Yes [provider]  warfarin (COUMADIN) 4 MG tablet Take as directed  by [facility] Patient taking differently: Take 1 mg by mouth daily.  10/30/17  Yes Allred, Jeneen Rinks, MD  ipratropium-albuterol (DUONEB) 0.5-2.5 (3) MG/3ML SOLN Take 3 mLs by nebulization every 6 (six) hours as needed. 04/15/18   Mendel Corning, MD    Current Facility-Administered Medications  Medication Dose Route Frequency Provider Last Rate Last Dose  . 0.9 %  sodium chloride infusion  250 mL Intravenous Continuous Renee Pain, MD      . albuterol (PROVENTIL) (2.5 MG/3ML) 0.083% nebulizer solution 2.5 mg  2.5 mg Nebulization Q4H Renee Pain, MD   2.5 mg at 11/17/18 1106  . chlorhexidine (PERIDEX) 0.12 % solution 15 mL  15 mL Mouth Rinse BID Renee Pain, MD   15 mL at 11/17/18 1011  . heparin ADULT infusion 100 units/mL (25000 units/230m sodium chloride 0.45%)  950 Units/hr Intravenous Continuous JRenee Pain MD 9.5 mL/hr at 11/17/18 0700 950 Units/hr at 11/17/18 0700  . insulin aspart (novoLOG)  injection 0-9 Units  0-9 Units Subcutaneous Q4H JRenee Pain MD   2 Units at 11/17/18 [phone] . lactated ringers infusion   Intravenous Continuous JRenee Pain MD 60 mL/hr at 11/17/18 0700    . MEDLINE mouth rinse  15 mL Mouth Rinse q12n4p JRenee Pain MD   15 mL at 11/17/18 1252  . morphine 2 MG/ML injection 2 mg  2 mg Intravenous Once EMargaretha Seeds MD      . mupirocin ointment (BACTROBAN) 2 %   Nasal BID AKipp Brood MD      . ondansetron (ZOFRAN) injection 4 mg  4 mg Intravenous Q6H PRN JRenee Pain MD        VITAL SIGNS: BP (!) 91/52   Pulse (!) 133   Temp 100.2 F (37.9 C)   Resp 20   Ht 5' 3"  (1.6 m)   Wt 66.4 kg   SpO2 92%   BMI 25.93 kg/m   VENTILATOR SETTINGS: FiO2 (%):  [40 %-65 %] 45 %  INTAKE / OUTPUT: I/O last 3 completed shifts: In: 8358.5 [I.V.:5173.6; IV Piggyback:3184.8] Out: 350 [Urine:350]  PHYSICAL EXAMINATION: General.  Well-developed elderly lady, more alert and awake today, on BiPAP. HENT.  Atraumatic, no scleral icterus, left eye with patch. Lungs.  Coarse breath sounds with right lower lobe rhonchi. CV.  Regular rate and rhythm, no murmurs. Abdomen.  Soft, nontender, nondistended, bowel sounds positive. Neuro. Alert, little interactive, following simple commands and moving all extremities. Extremities.  No edema, no cyanosis, pulses intact bilaterally.  LABS:  BASIC METABOLIC PROFILE Recent Labs  Lab 11/16/18 0429 11/16/18 0608 11/16/18 1314 11/17/18 0815  NA 134* 137 140 139  K 4.8 4.7 4.3 4.2  CL 107 109 108 103  CO2 12* 12* 15* 23  BUN 47* 49* 52* 53*  CREATININE 2.44* 2.63* 2.76* 3.02*  GLUCOSE 256* 236* 126* 191*  CALCIUM 7.7* 7.6* 7.8* 8.4*  MG 1.7 1.5* 2.1  --   PHOS 5.2* 5.2*  --   --     Glucose Recent Labs  Lab 11/16/18 1600 11/16/18 1949 11/16/18 2346 11/17/18 0404 11/17/18 0723 11/17/18 1214  GLUCAP 167* 208* 198* 174* 200* 177*    Liver Enzymes Recent Labs  Lab 12/06/2018 2230  11/16/18 0429  AST 367* 809*  ALT 236* 431*  ALKPHOS 98 120  BILITOT 1.0 1.0  ALBUMIN 2.7* 2.4*    CBC Recent Labs  Lab 11/28/2018 2230 11/16/18 0608 11/17/18 0405  WBC 7.4 7.6 5.7  HGB 8.8* 8.7* 7.6*  HCT 32.6* 31.3* 25.2*  PLT 188 192 185    COAGULATION STUDIES Recent Labs  Lab 11/16/18 0608  APTT 36  INR 1.85    SEPSIS MARKERS Recent Labs  Lab 11/16/18 0429  11/16/18 1627 11/17/18 0815 11/17/18 1038  LATICACIDVEN  --    < > 3.4* 2.3* 2.5*  PROCALCITON 6.25  --   --   --   --    < > = values in this interval not displayed.    ABG Recent Labs  Lab 12/01/2018 2237 11/16/18 0217 11/16/18 0413  PHART 7.258* 7.175* 7.151*  PCO2ART 40.9 46.1 45.3  PO2ART 145.0* 83.0 44.0*    Cardiac Enzymes Recent Labs  Lab 11/16/18 0429 11/16/18 0608 11/16/18 1314  TROPONINI 0.06* 0.05* 0.05*    Imaging No results found.  STUDIES:  12-lead EKG (12/05/2018 2214): Sinus rhythm with intraventricular conduction delay. 12-lead EKG (11/16/2018 0111): Ventricular tachycardia (pulseless) 12-lead EKG (11/16/2018 0116): Sinus bradycardia with right bundle branch block  CULTURES: Results for orders placed or performed during the hospital encounter of 11/20/2018  Blood Culture (routine x 2)     Status: None (Preliminary result)   Collection Time: 11/29/2018 10:14 PM  Result Value Ref Range Status   Specimen Description BLOOD LEFT HAND  Final   Special Requests   Final    BOTTLES DRAWN AEROBIC ONLY Blood Culture adequate volume   Culture   Final    NO GROWTH 1 DAY Performed at [facility], [place] [address], [place], [place] [address]    Report Status PENDING  Incomplete  Blood Culture (routine x 2)     Status: None (Preliminary result)   Collection Time: 11/14/2018 10:19 PM  Result Value Ref Range Status   Specimen Description BLOOD RIGHT HAND  Final   Special Requests   Final    BOTTLES DRAWN AEROBIC AND ANAEROBIC Blood Culture results may not be optimal due to an  inadequate volume of blood received in culture bottles   Culture   Final    NO GROWTH 1 DAY Performed at [facility], [place] [address], [place], [place] [address]    Report Status PENDING  Incomplete  Culture, blood (routine x 2)     Status: None (Preliminary result)   Collection Time: 11/16/18  4:42 AM  Result Value Ref Range Status   Specimen Description BLOOD LEFT HAND  Final   Special Requests   Final    BOTTLES DRAWN AEROBIC ONLY Blood Culture results may not be optimal due to an inadequate volume of blood received in culture bottles   Culture   Final    NO GROWTH 1 DAY Performed at [facility], [place] [address], [place], [place] [address]    Report Status PENDING  Incomplete  Culture, blood (routine x 2)     Status: None (Preliminary result)   Collection Time: 11/16/18  4:48 AM  Result Value Ref Range Status   Specimen Description BLOOD RIGHT WRIST  Final   Special Requests   Final    BOTTLES DRAWN AEROBIC AND ANAEROBIC Blood Culture adequate volume   Culture   Final    NO GROWTH 1 DAY Performed at [facility], [place] [address], [place], [place] [address]    Report Status PENDING  Incomplete  MRSA PCR Screening     Status: Abnormal   Collection Time: 11/16/18  5:14 AM  Result Value Ref Range Status   MRSA by PCR POSITIVE (A) NEGATIVE  Final    Comment:        The GeneXpert MRSA Assay (FDA approved for NASAL specimens only), is one component of a comprehensive MRSA colonization surveillance program. It is not intended to diagnose MRSA infection nor to guide or monitor treatment for MRSA infections. RESULT CALLED TO, READ BACK BY AND VERIFIED WITH: RN Wilhemina Cash [id number] MLM Performed at [facility], [place] [address] [place], [place] [address]   Respiratory Panel by PCR     Status: Abnormal   Collection Time: 11/16/18 12:45 PM  Result Value Ref Range Status   Adenovirus NOT DETECTED NOT DETECTED Final   Coronavirus 229E NOT DETECTED NOT  DETECTED Final   Coronavirus HKU1 NOT DETECTED NOT DETECTED Final   Coronavirus NL63 NOT DETECTED NOT DETECTED Final   Coronavirus OC43 NOT DETECTED NOT DETECTED Final   Metapneumovirus DETECTED (A) NOT DETECTED Final   Rhinovirus / Enterovirus NOT DETECTED NOT DETECTED Final   Influenza A NOT DETECTED NOT DETECTED Final   Influenza B NOT DETECTED NOT DETECTED Final   Parainfluenza Virus 1 NOT DETECTED NOT DETECTED Final   Parainfluenza Virus 2 NOT DETECTED NOT DETECTED Final   Parainfluenza Virus 3 NOT DETECTED NOT DETECTED Final   Parainfluenza Virus 4 NOT DETECTED NOT DETECTED Final   Respiratory Syncytial Virus NOT DETECTED NOT DETECTED Final   Bordetella pertussis NOT DETECTED NOT DETECTED Final   Chlamydophila pneumoniae NOT DETECTED NOT DETECTED Final   Mycoplasma pneumoniae NOT DETECTED NOT DETECTED Final    Comment: Performed at [facility], [place] [address], [place], [place] [address]    ANTIBIOTICS: Augmentin (11/14/2018-11/27/2018) Cefepime/vancomycin (12/05/2018>>  SIGNIFICANT EVENTS: 12/03/2018: Transferred from [place] center with altered mental status and a clinical impression of pneumonia. 11/16/2018: Pulseless ventricular tachycardia in ER.  Spontaneously converted to sinus bradyarrhythmia.   ASSESSMENT / PLAN:   Sepsis secondary to pneumonia. Metabolic Anion gap acidosis. Did had one episode of being febrile at 100.2,  Chest x-ray with right upper and lower lobe infiltrate. Blood cultures with no growth. Urine cultures pending. Respiratory viral panel shows metapneumovirus. Lactic acid trending down. -Disontinue cefepime and vancomycin as this is viral pneumonia. -We will stop trending lactic acid. -Patient was weaned off of BiPAP successfully and was doing well on 6 L, we will avoid BiPAP in the future. -Discontinue bicarb infusion. -And is ready to be transferred out of ICU to triad hospitalist care.  Paroxysmal atrial fibrillation.  Mild  tachycardia. She was on amiodarone, Coumadin and metoprolol at home. We will restart home meds once passed swallow test.  Multiple episodes of pulseless ventricular tachycardia.  Patient has a pacemaker in place.  She did had another short episode overnight which was self-converted to sinus rhythm.  Cardiology do not have any other thing to offer apart from giving her some amnio. -Continue monitoring.  Hypertension.  Currently little softer blood pressure but remained within acceptable range without Levophed.  HFrEF.  Echo done in May 2019 shows ejection fraction of 40 to 45% with grade 1 diastolic dysfunction. BNP elevated at 1603 today. Patient receives approximately 3 L of fluids due to sepsis. Does not appear much volume overload on exam. -We will continue to monitor-diurese as needed.  Elevated troponin.  Troponin was positive at 0.06, most likely due to demand, now trending down.   AKI with CKD.  Baseline creatinine around 1.7-1.8 Currently elevated at 3.02.  Patient is oliguric now she did produce 350 mL in the past  24-hour.  Nephrology was consulted and they do not think that she is a candidate for dialysis.  Abnormal LFTs.  Transaminitis most likely secondary to sepsis. -Continue to monitor.  DM.  Current A1c of 7.2. -Continue SSI every 4 hourly.   Anemia.  Most likely secondary to CKD. Baseline hemoglobin around 9-10, currently 8.7.  Hypomagnesemia.  Replete as needed.      FAMILY  - Updates: Had a long discussion with nephew and caregiver yesterday, they do not want to escalate care but continue to have similar level at this time.  If patient deteriorates focus should be on keeping her comfortable.  - Inter-disciplinary family meet or Palliative Care meeting due by:  11/22/2018  Lorella Nimrod MD PGY3 Pager. [phone]

## 2018-11-17 NOTE — Progress Notes (Addendum)
Spoke to [place] Dr. Tamala Julian about pt's BP of 74/51. Will continue to monitor for now and if condition declines, will discuss with patient's POA (nephew).

## 2018-11-18 DIAGNOSIS — D539 Nutritional anemia, unspecified: Secondary | ICD-10-CM

## 2018-11-18 LAB — CBC
HCT: 26.7 % — ABNORMAL LOW (ref 36.0–46.0)
Hemoglobin: 8 g/dL — ABNORMAL LOW (ref 12.0–15.0)
MCH: 27.6 pg (ref 26.0–34.0)
MCHC: 30 g/dL (ref 30.0–36.0)
MCV: 92.1 fL (ref 80.0–100.0)
Platelets: 209 10*3/uL (ref 150–400)
RBC: 2.9 MIL/uL — ABNORMAL LOW (ref 3.87–5.11)
RDW: 16.9 % — AB (ref 11.5–15.5)
WBC: 6.3 10*3/uL (ref 4.0–10.5)
nRBC: 0 % (ref 0.0–0.2)

## 2018-11-18 LAB — GLUCOSE, CAPILLARY
GLUCOSE-CAPILLARY: 130 mg/dL — AB (ref 70–99)
GLUCOSE-CAPILLARY: 136 mg/dL — AB (ref 70–99)
Glucose-Capillary: 103 mg/dL — ABNORMAL HIGH (ref 70–99)
Glucose-Capillary: 119 mg/dL — ABNORMAL HIGH (ref 70–99)
Glucose-Capillary: 129 mg/dL — ABNORMAL HIGH (ref 70–99)

## 2018-11-18 LAB — HEPARIN LEVEL (UNFRACTIONATED): Heparin Unfractionated: <0.1 IU/mL — ABNORMAL LOW (ref 0.30–0.70)

## 2018-11-18 NOTE — Consult Note (Signed)
   [facility] CM Inpatient Consult   11/18/2018  Kylie Sanchez Aug 04, 1925 [id number]  Patient chart has been reviewed for unplanned readmission risk score of 33%, extreme.   Per chart review, patient transitioned to comfort measures. Spoke with inpatient RNCM about patient progression and current plan is to seek palliative consult.  No [facility] Care Management needs.   [name], MSN, [facility] Liaison Nurse Mobile Phone [phone]  Toll free office [phone]

## 2018-11-18 NOTE — Progress Notes (Addendum)
PROGRESS NOTE    Kylie Sanchez  ZOX:[id number] DOB: 11/21/1924 DOA: 11/27/2018 PCP: No primary care provider on file.   Brief Narrative: Kylie Sanchez is a 85 y.o. female with a history of atrial fibrillation, diabetes mellitus, CKD. Patient presented secondary acute respiratory failure in setting of pneumonia. She developed an acute kidney injury which has led to oliguria. Discussions with family have led to no escalation of care, but full comfort recommendations are not clear at this time.    Assessment & Plan:   Principal Problem:   Sepsis due to pneumonia [facility]) Active Problems:   PAF (paroxysmal atrial fibrillation) (HCC)   Type 2 diabetes mellitus with hyperlipidemia (HCC)   Acute metabolic encephalopathy   AKI (acute kidney injury) ([place])   Acute combined systolic and diastolic CHF, NYHA class 1 (HCC)   Abnormal LFTs   Lactic acidosis   Pulseless ventricular tachycardia (HCC)   Macrocytic anemia   Sepsis Secondary to pneumonia. Treated empirically with vancomycin/cefepime which were discontinued with a positive RVP as mentioned below. Patient required vasopressors of which she was able to be weaned  Acute respiratory failure with hypoxia Patient required BiPAP which was weaned off. Currently still requiring oxygen via nasal canula. Does not appear labored. -Continue HFNC until goals of care discussions can be continued.  Paroxysmal atrial fibrillation On amiodarone, metoprolol and Coumadin as an outpatient. Medications on hold now secondary to change in patient's goals of care.  Pulseless ventricular tachycardia Episodes occurred in ICU. Patient is currently comfort measures from chart review. Patient currently on telemetry, which will need to be discontinued.  Essential hypertension Currently stable.  Chronic heart failure with reduced EF Stable.  Elevated troponin Thought secondary to demand ischemia.  Shock liver In setting of hypotension  Acute kidney injury on  CKD stage IV Nephrology consulted previously and thought secondary to ATN in setting of hypotension. Patient with poor urine output. Currently not a candidate for hemodialysis. Per chart review, this was discussed with family.  Pneumonia Empirically treated with empiric antibiotics as mentioned above. RVP significant for metapneumovirus and antibiotics discontinued.  Anion gap/non-anion gap metabolic acidosis Resolved with sodium bicarb infusion.  Pressure injury Left buttock and coccyx, not present on admission.   DVT prophylaxis: Heparin drip previously. If confirmed comfort care, will not have prophylaxis Code Status:   Code Status: DNR Family Communication: None at bedside. Called [facility] and left message. Called multiple times. Disposition Plan: Depending on family discussions   Consultants:   PCCM  Nephrology  Palliative care medicine  Procedures:   None  Antimicrobials:  Vancomycin  Cefepime  Levaquin   Subjective: Patient reports no pain or dyspnea.  Objective: Vitals:   11/18/18 0757 11/18/18 1150 11/18/18 1343 11/18/18 1352  BP:  (!) 94/49    Pulse:  76  82  Resp:  15  16  Temp:  98.7 F (37.1 C)    TempSrc:  Axillary    SpO2: 96% 100% 100% 100%  Weight:      Height:        Intake/Output Summary (Last 24 hours) at 11/18/2018 1450 Last data filed at 11/17/2018 1500 Gross per 24 hour  Intake -  Output 0 ml  Net 0 ml   Filed Weights   11/28/2018 2215 11/16/18 0330 11/17/18 1958  Weight: 59 kg 66.4 kg 70.1 kg    Examination:  General exam: Appears calm and comfortable Respiratory system: Diminished. Respiratory effort normal. Cardiovascular system: S1 & S2 heard, RRR. No murmurs, rubs, gallops  or clicks. Gastrointestinal system: Abdomen is nondistended, soft and nontender. No organomegaly or masses felt. Normal bowel sounds heard. Central nervous system: Alert. Extremities: No edema. No calf tenderness Skin: No cyanosis. No rashes    Data  Reviewed: I have personally reviewed following labs and imaging studies  CBC: Recent Labs  Lab 12/05/2018 2230 11/16/18 0608 11/17/18 0405 11/18/18 1042  WBC 7.4 7.6 5.7 6.3  NEUTROABS 6.1  --   --   --   HGB 8.8* 8.7* 7.6* 8.0*  HCT 32.6* 31.3* 25.2* 26.7*  MCV 100.0 98.1 93.0 92.1  PLT 188 192 185 749   Basic Metabolic Panel: Recent Labs  Lab 11/30/2018 2230 11/16/18 0429 11/16/18 0608 11/16/18 1314 11/17/18 0815  NA 134* 134* 137 140 139  K 5.4* 4.8 4.7 4.3 4.2  CL 105 107 109 108 103  CO2 15* 12* 12* 15* 23  GLUCOSE 230* 256* 236* 126* 191*  BUN 49* 47* 49* 52* 53*  CREATININE 2.47* 2.44* 2.63* 2.76* 3.02*  CALCIUM 8.2* 7.7* 7.6* 7.8* 8.4*  MG  --  1.7 1.5* 2.1  --   PHOS  --  5.2* 5.2*  --   --    GFR: Estimated Creatinine Clearance: 11.7 mL/min (A) (by C-G formula based on SCr of 3.02 mg/dL (H)). Liver Function Tests: Recent Labs  Lab 12/01/2018 2230 11/16/18 0429  AST 367* 809*  ALT 236* 431*  ALKPHOS 98 120  BILITOT 1.0 1.0  PROT 6.4* 5.9*  ALBUMIN 2.7* 2.4*   No results for input(s): LIPASE, AMYLASE in the last 168 hours. No results for input(s): AMMONIA in the last 168 hours. Coagulation Profile: Recent Labs  Lab 11/16/18 0608  INR 1.85   Cardiac Enzymes: Recent Labs  Lab 11/16/18 0429 11/16/18 0608 11/16/18 1314  TROPONINI 0.06* 0.05* 0.05*   BNP (last 3 results) No results for input(s): PROBNP in the last 8760 hours. HbA1C: Recent Labs    11/16/18 0428  HGBA1C 7.2*   CBG: Recent Labs  Lab 11/17/18 1626 11/17/18 2124 11/18/18 0427 11/18/18 0739 11/18/18 1148  GLUCAP 142* 175* 130* 103* 119*   Lipid Profile: No results for input(s): CHOL, HDL, LDLCALC, TRIG, CHOLHDL, LDLDIRECT in the last 72 hours. Thyroid Function Tests: No results for input(s): TSH, T4TOTAL, FREET4, T3FREE, THYROIDAB in the last 72 hours. Anemia Panel: No results for input(s): VITAMINB12, FOLATE, FERRITIN, TIBC, IRON, RETICCTPCT in the last 72  hours. Sepsis Labs: Recent Labs  Lab 11/16/18 0429  11/16/18 1314 11/16/18 1627 11/17/18 0815 11/17/18 1038  PROCALCITON 6.25  --   --   --   --   --   LATICACIDVEN  --    < > 4.3* 3.4* 2.3* 2.5*   < > = values in this interval not displayed.    Recent Results (from the past 240 hour(s))  Blood Culture (routine x 2)     Status: None (Preliminary result)   Collection Time: 11/26/2018 10:14 PM  Result Value Ref Range Status   Specimen Description BLOOD LEFT HAND  Final   Special Requests   Final    BOTTLES DRAWN AEROBIC ONLY Blood Culture adequate volume   Culture   Final    NO GROWTH 2 DAYS Performed at [facility], [address] [place], [place] [address]    Report Status PENDING  Incomplete  Blood Culture (routine x 2)     Status: None (Preliminary result)   Collection Time: 11/24/2018 10:19 PM  Result Value Ref Range Status  Specimen Description BLOOD RIGHT HAND  Final   Special Requests   Final    BOTTLES DRAWN AEROBIC AND ANAEROBIC Blood Culture results may not be optimal due to an inadequate volume of blood received in culture bottles   Culture   Final    NO GROWTH 2 DAYS Performed at [facility], [place] [address], [place], [place] [address]    Report Status PENDING  Incomplete  Culture, blood (routine x 2)     Status: None (Preliminary result)   Collection Time: 11/16/18  4:42 AM  Result Value Ref Range Status   Specimen Description BLOOD LEFT HAND  Final   Special Requests   Final    BOTTLES DRAWN AEROBIC ONLY Blood Culture results may not be optimal due to an inadequate volume of blood received in culture bottles   Culture   Final    NO GROWTH 2 DAYS Performed at [facility], [place] [address] [place], [place] [address]    Report Status PENDING  Incomplete  Culture, blood (routine x 2)     Status: None (Preliminary result)   Collection Time: 11/16/18  4:48 AM  Result Value Ref Range Status   Specimen Description BLOOD RIGHT WRIST  Final    Special Requests   Final    BOTTLES DRAWN AEROBIC AND ANAEROBIC Blood Culture adequate volume   Culture   Final    NO GROWTH 2 DAYS Performed at [facility], [place] [address], [place], [place] [address]    Report Status PENDING  Incomplete  MRSA PCR Screening     Status: Abnormal   Collection Time: 11/16/18  5:14 AM  Result Value Ref Range Status   MRSA by PCR POSITIVE (A) NEGATIVE Final    Comment:        The GeneXpert MRSA Assay (FDA approved for NASAL specimens only), is one component of a comprehensive MRSA colonization surveillance program. It is not intended to diagnose MRSA infection nor to guide or monitor treatment for MRSA infections. RESULT CALLED TO, READ BACK BY AND VERIFIED WITH: RN Wilhemina Cash [phone] MLM Performed at [facility], [place] [address] [place], [place] [address]   Culture, Urine     Status: None   Collection Time: 11/16/18  8:15 AM  Result Value Ref Range Status   Specimen Description URINE, CATHETERIZED  Final   Special Requests ADD  Final   Culture   Final    NO GROWTH Performed at [facility], [place] [address] [place], [place] [address]    Report Status 11/17/2018 FINAL  Final  Respiratory Panel by PCR     Status: Abnormal   Collection Time: 11/16/18 12:45 PM  Result Value Ref Range Status   Adenovirus NOT DETECTED NOT DETECTED Final   Coronavirus 229E NOT DETECTED NOT DETECTED Final   Coronavirus HKU1 NOT DETECTED NOT DETECTED Final   Coronavirus NL63 NOT DETECTED NOT DETECTED Final   Coronavirus OC43 NOT DETECTED NOT DETECTED Final   Metapneumovirus DETECTED (A) NOT DETECTED Final   Rhinovirus / Enterovirus NOT DETECTED NOT DETECTED Final   Influenza A NOT DETECTED NOT DETECTED Final   Influenza B NOT DETECTED NOT DETECTED Final   Parainfluenza Virus 1 NOT DETECTED NOT DETECTED Final   Parainfluenza Virus 2 NOT DETECTED NOT DETECTED Final   Parainfluenza Virus 3 NOT DETECTED NOT DETECTED Final   Parainfluenza Virus 4  NOT DETECTED NOT DETECTED Final   Respiratory Syncytial Virus NOT  DETECTED NOT DETECTED Final   Bordetella pertussis NOT DETECTED NOT DETECTED Final   Chlamydophila pneumoniae NOT DETECTED NOT DETECTED Final   Mycoplasma pneumoniae NOT DETECTED NOT DETECTED Final    Comment: Performed at [facility], [place] [address], [place], [place] [address]         Radiology Studies: No results found.      Scheduled Meds: . albuterol  2.5 mg Nebulization TID  . chlorhexidine  15 mL Mouth Rinse BID  . insulin aspart  0-9 Units Subcutaneous Q4H  .  morphine injection  2 mg Intravenous Once  . mupirocin ointment   Nasal BID   Continuous Infusions: . sodium chloride    . lactated ringers Stopped (11/18/18 1200)     LOS: 2 days     Cordelia Poche, MD [facility] 11/18/2018, 2:50 PM  If 7PM-7AM, please contact night-coverage [email or web address]

## 2018-11-18 NOTE — Progress Notes (Signed)
[facility] for Heparin  Indication: atrial fibrillation  Allergies  Allergen Reactions  . No Known Allergies    Patient Measurements: Height: 5\' 4"  (162.6 cm) Weight: 154 lb 8.7 oz (70.1 kg) IBW/kg (Calculated) : 54.7  Vital Signs: Temp: 98.4 F (36.9 C) (01/08 0741) Temp Source: Axillary (01/08 0741) BP: 122/84 (01/08 0741) Pulse Rate: 60 (01/08 0741)  Labs: Recent Labs    11/20/2018 2230 11/16/18 0429 11/16/18 0608 11/16/18 1314 11/16/18 1627 11/17/18 0405 11/17/18 0815  HGB 8.8*  --  8.7*  --   --  7.6*  --   HCT 32.6*  --  31.3*  --   --  25.2*  --   PLT 188  --  192  --   --  185  --   APTT  --   --  36  --   --   --   --   LABPROT  --   --  21.1*  --   --   --   --   INR  --   --  1.85  --   --   --   --   HEPARINUNFRC  --   --   --   --  0.22* 0.39  --   CREATININE 2.47* 2.44* 2.63* 2.76*  --   --  3.02*  TROPONINI  --  0.06* 0.05* 0.05*  --   --   --     Estimated Creatinine Clearance: 11.7 mL/min (A) (by C-G formula based on SCr of 3.02 mg/dL (H)).  Assessment: 85 y.o. female with h/o Afib, Coumadin on hold and INR subtherapeutic- heparin was started. Noted plans for no escalation of care. Swallow evaluation done and able try crushed medications -heparin level= 0.39 -hg= 7.6 on 1/7   Goal of Therapy:  Heparin level 0.3-0.7 units/ml Monitor platelets by anticoagulation protocol: Yes   Plan:  -No heparin changes needed -Daily CBC and heparin level -Consider restart coumadin based on Hg trend  Hildred Laser, PharmD Clinical Pharmacist **Pharmacist phone directory can now be found on [email or web address] ([email or web address]).  Listed under [place].

## 2018-11-18 NOTE — Progress Notes (Signed)
Pt O2 sats 75-80% on 6L nasal cannula. Switched to NRB with 100% sats. Pt continued to pull off NRB, so switched to HFNC. Pt O2 sats remain in high 90's.

## 2018-11-19 ENCOUNTER — Encounter (HOSPITAL_COMMUNITY): Payer: Self-pay | Admitting: General Practice

## 2018-11-19 DIAGNOSIS — Z7189 Other specified counseling: Secondary | ICD-10-CM

## 2018-11-19 DIAGNOSIS — Z515 Encounter for palliative care: Secondary | ICD-10-CM

## 2018-11-19 DIAGNOSIS — I5041 Acute combined systolic (congestive) and diastolic (congestive) heart failure: Secondary | ICD-10-CM

## 2018-11-19 LAB — GLUCOSE, CAPILLARY
Glucose-Capillary: 101 mg/dL — ABNORMAL HIGH (ref 70–99)
Glucose-Capillary: 73 mg/dL (ref 70–99)
Glucose-Capillary: 84 mg/dL (ref 70–99)

## 2018-11-19 MED ORDER — GLYCOPYRROLATE 0.2 MG/ML IJ SOLN
0.2000 mg | INTRAMUSCULAR | Status: DC | PRN
  Administered 2018-11-19: 0.2 mg via SUBCUTANEOUS
  Filled 2018-11-19: qty 1

## 2018-11-19 MED ORDER — LORAZEPAM 2 MG/ML PO CONC: 1.0000 mg | ORAL | Status: DC | PRN

## 2018-11-19 MED ORDER — HYDROMORPHONE HCL 1 MG/ML IJ SOLN: 0.5000 mg | INTRAMUSCULAR | Status: DC | PRN

## 2018-11-19 MED ORDER — HYDROMORPHONE HCL 1 MG/ML IJ SOLN
0.5000 mg | INTRAMUSCULAR | Status: AC
  Administered 2018-11-19: 0.5 mg via INTRAVENOUS
  Filled 2018-11-19: qty 1

## 2018-11-19 MED ORDER — LORAZEPAM 2 MG/ML IJ SOLN: 1.0000 mg | INTRAMUSCULAR | Status: DC | PRN

## 2018-11-19 MED ORDER — MORPHINE SULFATE (PF) 2 MG/ML IV SOLN: 1.0000 mg | INTRAVENOUS | Status: DC

## 2018-11-19 MED ORDER — HYDROMORPHONE HCL 1 MG/ML IJ SOLN
0.5000 mg | INTRAMUSCULAR | Status: DC | PRN
  Administered 2018-11-19 – 2018-11-20 (×4): 0.5 mg via INTRAVENOUS
  Filled 2018-11-19 (×4): qty 1

## 2018-11-19 MED ORDER — LORAZEPAM 1 MG PO TABS: 1.0000 mg | ORAL_TABLET | ORAL | Status: DC | PRN

## 2018-11-19 MED ORDER — ALBUTEROL SULFATE (2.5 MG/3ML) 0.083% IN NEBU: 2.5000 mg | INHALATION_SOLUTION | Freq: Four times a day (QID) | RESPIRATORY_TRACT | Status: DC | PRN

## 2018-11-19 MED ORDER — GLYCOPYRROLATE 0.2 MG/ML IJ SOLN
0.2000 mg | INTRAMUSCULAR | Status: DC | PRN
  Administered 2018-11-20 (×3): 0.2 mg via INTRAVENOUS
  Filled 2018-11-19 (×3): qty 1

## 2018-11-19 MED ORDER — GLYCOPYRROLATE 1 MG PO TABS: 1.0000 mg | ORAL_TABLET | ORAL | Status: DC | PRN

## 2018-11-19 MED ORDER — ONDANSETRON 4 MG PO TBDP
4.0000 mg | ORAL_TABLET | Freq: Four times a day (QID) | ORAL | Status: DC | PRN
  Filled 2018-11-19: qty 1

## 2018-11-19 MED ORDER — MORPHINE SULFATE (PF) 2 MG/ML IV SOLN
1.0000 mg | INTRAVENOUS | Status: DC | PRN
  Filled 2018-11-19: qty 1

## 2018-11-19 MED ORDER — ONDANSETRON HCL 4 MG/2ML IJ SOLN: 4.0000 mg | Freq: Four times a day (QID) | INTRAMUSCULAR | Status: DC | PRN

## 2018-11-19 NOTE — Consult Note (Signed)
Consultation Note Date: 11/19/2018   Patient Name: Kylie Sanchez  DOB: 28-May-1925  MRN: [id number]  Age / Sex: 85 y.o., female  PCP: No primary care provider on file. Referring Physician: Mariel Aloe, MD  Reason for Consultation: Establishing goals of care  HPI/Patient Profile: 85 y.o. female  with past medical history of DM2, atrial fib, CKD, s/p hip fracture repair, CHFrEF, and residing at [facility] admitted on 11/12/2018 after being found unresponsive. Workup revealed sepsis with pneumonia resulting in acute kidney failure, acute respiratory failure, shock liver. She required IV pressors that have been weaned, Bipap that has been weaned however, oxygen requirements continue to be high- she is currently on 6L HFNC. She has been oliguric and family has stated she would not want dialysis. She continues to receive supportive care with IV fluids and O2 titration. Palliative medicine consulted for continued GOC.  Clinical Assessment and Goals of Care: Met at the bedside with patient's Kylie Sanchez- Kylie Sanchez.  She was familiar with palliative medicine having with met with one of our providers on previous admission. Patient in bed appeared to have labored breathing.  She was unresponsive to my touch or voice.  Family notes patient was sweet, but "would turn in a minute!". States she appreciated being able to do things her way.  Discussed goals of care and expectations for recovery with [facility].  Kylie Sanchez stated she and her husband Kylie Sanchez did not think that patient was improving and were of the understanding that she was dying.  They believed that the goal of care was to make her comfortable and support her through an end-of-life process.    We discussed full comfort path including discontinuing IV fluids and titrating off of high flow oxygen.  We discussed using opioids and benzodiazepines to control symptoms of dying and  supporting through natural dying process.  Which she will stated this was their goal and this would be Kylie Sanchez's goal.  She said that this was not a quality of life that Kylie Sanchez would want.  Kylie Sanchez's quality of life had greatly declined since her hip fracture.  Kylie Sanchez and Kylie Sanchez (patient's nephew, also HCPOA, who was present via phone) were hopeful that [name] could have a peaceful death.  They stated that in this case Kylie Sanchez would not want further life prolonging measures.  Kylie Sanchez support as she discussed difficult times with multiple stressors in the family. Her father in law is coming to their home with Hospice today.   Kylie Sanchez was also at the bedside at the close of our conversation and confirmed plan with family.  Primary Decision Maker HCPOA- Kylie Sanchez and Kylie Sanchez    SUMMARY OF RECOMMENDATIONS -D/C IV fluids -Titrate off HFNC -D/C cardiac monitoring, d/c pulse oximetry, d/c labs -Hydromorphone .37m q15 min prn for SOB, increased RR -lorazepam 149mIV q4hr prn -[name] .52m37mV q4hr prn for terminal secretions -Comfort measures -Family requests that patient remain inpatient for remainder of days- I believe with transition to full comfort she will become unstable for transfer  Code Status/Advance Care Planning:  DNR  Palliative Prophylaxis:   Frequent Pain Assessment  Additional Recommendations (Limitations, Scope, Preferences):  Full Comfort Care  Psycho-social/Spiritual:   Desire for further Chaplaincy support:yes  Prognosis:    Hours - Days  Discharge Planning: Anticipated Hospital Death  Primary Diagnoses: Present on Admission: . Acute combined systolic and diastolic CHF, NYHA class 1 ([place]) . Acute metabolic encephalopathy . AKI (acute kidney injury) ([place]) . PAF (paroxysmal atrial fibrillation) ([place]) . Sepsis due to pneumonia ([place]) . Type 2 diabetes mellitus with hyperlipidemia ([place]) . Abnormal LFTs . Lactic acidosis . Macrocytic anemia   I  have reviewed the medical record, interviewed the patient and family, and examined the patient. The following aspects are pertinent.  Past Medical History:  Diagnosis Date  . Cancer ([place]) 2015   left eye  . Cataract   . CKD (chronic kidney disease) stage 3, GFR 30-59 ml/min (HCC)    due to DM  . Dizziness   . Hyperlipidemia   . Hypertension   . Hypertensive cardiovascular disease   . Impaired vision   . Memory loss   . Paroxysmal atrial fibrillation (HCC)   . Presence of permanent cardiac pacemaker   . Retinopathy   . Sick sinus syndrome ([place])   . Type II or unspecified type diabetes mellitus without mention of complication, not stated as uncontrolled    Social History   Socioeconomic History  . Marital status: Single    Spouse name: Not on file  . Number of children: 0  . Years of education: 11 years  . Highest education level: High school graduate  Occupational History  . Occupation: Retired  Scientific laboratory technician  . Financial resource strain: Not on file  . Food insecurity:    Worry: Not on file    Inability: Not on file  . Transportation needs:    Medical: Not on file    Non-medical: Not on file  Tobacco Use  . Smoking status: Never Smoker  . Smokeless tobacco: Never Used  Substance and Sexual Activity  . Alcohol use: No  . Drug use: No  . Sexual activity: Not on file  Lifestyle  . Physical activity:    Days per week: Not on file    Minutes per session: Not on file  . Stress: Not on file  Relationships  . Social connections:    Talks on phone: Not on file    Gets together: Not on file    Attends religious service: Not on file    Active member of club or organization: Not on file    Attends meetings of clubs or organizations: Not on file    Relationship status: Not on file  Other Topics Concern  . Not on file  Social History Narrative   Lives at home with her brother.  She has a caregiver in the home from 9am to 6:30pm.   Left-handed.   2 cups caffeine per  day.   Family History  Problem Relation Age of Onset  . Heart attack Father   . Heart disease Father   . Hypertension Mother   . Heart disease Mother   . Cancer Other    Scheduled Meds: . chlorhexidine  15 mL Mouth Rinse BID  .  morphine injection  1 mg Intravenous NOW   Continuous Infusions: PRN Meds:.glycopyrrolate **OR** glycopyrrolate **OR** glycopyrrolate, LORazepam **OR** LORazepam **OR** LORazepam, morphine injection, ondansetron **OR** ondansetron (ZOFRAN) IV Medications Prior to Admission:  Prior to Admission medications  Medication Sig Start Date End Date Taking? Authorizing Provider  albuterol (PROVENTIL) (2.5 MG/3ML) 0.083% nebulizer solution Take 2.5 mg by nebulization every 6 (six) hours as needed for wheezing or shortness of breath.   Yes [provider]  Amino Acids-Protein Hydrolys (FEEDING SUPPLEMENT, PRO-STAT SUGAR FREE 64,) LIQD Take 30 mLs by mouth daily.   Yes [provider]  amiodarone (PACERONE) 200 MG tablet Take amiodarone 2 tabs (400 mg) daily until 04/26/2018.  Then start amiodarone 200 mg daily on 04/27/2018. Patient taking differently: Take 200 mg by mouth daily.  04/15/18  Yes Rai, Ripudeep K, MD  amoxicillin-clavulanate (AUGMENTIN) 875-125 MG tablet Take 1 tablet by mouth 2 (two) times daily.   Yes [provider]  atorvastatin (LIPITOR) 40 MG tablet Take 1 tablet (40 mg total) by mouth daily at 6 PM. 10/28/17  Yes Unk Pinto, MD  b complex vitamins tablet Take 1 tablet by mouth daily.   Yes [provider]  blood glucose meter kit and supplies Test sugars once daily. Dispense based insurance preference. E11.9 11/20/17  Yes Vicie Mutters, PA-C  cetirizine (ZYRTEC) 10 MG tablet Take 10 mg by mouth daily as needed for allergies.   Yes [provider]  cholecalciferol (VITAMIN D) 1000 units tablet Take 1,000 Units by mouth every evening.    Yes [provider]  Coenzyme Q10 (CO Q 10) 100 MG CAPS Take  100 mg by mouth every evening.    Yes [provider]  escitalopram (LEXAPRO) 5 MG tablet Take 1 tablet (5 mg total) by mouth daily. Patient taking differently: Take 15 mg by mouth daily.  04/15/18  Yes Rai, Ripudeep K, MD  furosemide (LASIX) 20 MG tablet Take 1 tablet daily for BP & Fluid Patient taking differently: Take 40 mg by mouth daily.  02/23/18  Yes Unk Pinto, MD  glucose blood test strip Check sugars once to twice a day for diabetes E11.21 11/17/17  Yes Vicie Mutters, PA-C  HYDROcodone-acetaminophen (NORCO/VICODIN) 5-325 MG tablet Take 1-2 tablets by mouth every 4 (four) hours as needed for moderate pain.    Yes [provider]  LORazepam (ATIVAN) 0.5 MG tablet Take 0.25 mg by mouth daily as needed for anxiety.   Yes [provider]  metoprolol tartrate (LOPRESSOR) 25 MG tablet Take 0.5 tablets (12.5 mg total) by mouth 2 (two) times daily. 04/15/18  Yes Rai, Ripudeep K, MD  MULTIPLE VITAMIN PO Take 1 tablet by mouth daily.   Yes [provider]  multivitamin-lutein (OCUVITE-LUTEIN) CAPS capsule Take 1 capsule by mouth 2 (two) times daily.   Yes [provider]  nitroGLYCERIN (NITROSTAT) 0.4 MG SL tablet Place 1 tablet (0.4 mg total) under the tongue every 5 (five) minutes as needed for chest pain. 10/30/17 01/28/19 Yes Allred, Jeneen Rinks, MD  OLANZapine (ZYPREXA) 7.5 MG tablet Take 7.5 mg by mouth at bedtime.   Yes [provider]  polyethylene glycol (MIRALAX / GLYCOLAX) packet Take 17 g by mouth daily.    Yes [provider]  potassium chloride (K-DUR,KLOR-CON) 10 MEQ tablet Take 1 tablet (10 mEq total) by mouth daily. 04/16/18  Yes Rai, Ripudeep K, MD  PRESCRIPTION MEDICATION Take 8 oz by mouth 2 (two) times daily. MED PASS   Yes [provider]  rOPINIRole (REQUIP) 0.5 MG tablet Take 0.5 mg by mouth at bedtime.   Yes [provider]  sennosides-docusate sodium (SENOKOT-S) 8.6-50 MG tablet Take 1 tablet by mouth  daily.   Yes [provider]  warfarin (COUMADIN) 4 MG tablet Take as directed by [facility] Patient taking differently: Take 1 mg by mouth daily.  10/30/17  Yes Allred, Jeneen Rinks, MD  ipratropium-albuterol (DUONEB) 0.5-2.5 (3) MG/3ML SOLN Take 3 mLs by nebulization every 6 (six) hours as needed. 04/15/18   Mendel Corning, MD   Allergies  Allergen Reactions  . No Known Allergies    Review of Systems  Unable to perform ROS: Acuity of condition    Physical Exam Vitals signs and nursing note reviewed.  Cardiovascular:     Rate and Rhythm: Normal rate.  Pulmonary:     Comments: Increased effort, using accessory muslces Abdominal:     Palpations: Abdomen is soft.  Neurological:     Comments: nonresponsive to voice or touch     Vital Signs: BP (!) 112/57 (BP Location: Left Arm)   Pulse 74   Temp (!) 97.5 F (36.4 C) (Axillary)   Resp 16   Ht 5' 4"  (1.626 m)   Wt 69.6 kg   SpO2 94%   BMI 26.33 kg/m  Pain Scale: 0-10   Pain Score: 0-No pain   SpO2: SpO2: 94 % O2 Device:SpO2: 94 % O2 Flow Rate: .O2 Flow Rate (L/min): 4 L/min  IO: Intake/output summary:   Intake/Output Summary (Last 24 hours) at 11/19/2018 1139 Last data filed at 11/19/2018 0430 Gross per 24 hour  Intake 245.71 ml  Output 350 ml  Net -104.29 ml    LBM: Last BM Date: (PTA) Baseline Weight: Weight: 59 kg Most recent weight: Weight: 69.6 kg     Palliative Assessment/Data: PPS: 10%   Flowsheet Rows     Most Recent Value  Intake Tab  Referral Department  Hospitalist  Unit at Time of Referral  Cardiac/Telemetry Unit  Palliative Care Primary Diagnosis  Nephrology  Date Notified  11/18/18  Palliative Care Type  Return patient Palliative Care  Reason for referral  Clarify Goals of Care  Date of Admission  11/16/18  # of days IP prior to Palliative referral  2  Clinical Assessment  Psychosocial & Spiritual Assessment  Palliative Care Outcomes      Thank you for this consult. Palliative  medicine will continue to follow and assist as needed.   Time In: 1000 Time Out: 1200 Time Total: 120 minutes Prolonged services billed: yes Greater than 50%  of this time was spent counseling and coordinating care related to the above assessment and plan.  Signed by: Mariana Kaufman, AGNP-C Palliative Medicine    Please contact Palliative Medicine Team phone at [phone] for questions and concerns.  For individual provider: See Shea Evans

## 2018-11-19 NOTE — Progress Notes (Signed)
PROGRESS NOTE    [name]  NUU:[id number] DOB: Oct 07, 1925 DOA: 12/07/2018 PCP: No primary care provider on file.   Brief Narrative: Kylie Sanchez is a 85 y.o. female with a history of atrial fibrillation, diabetes mellitus, CKD. Patient presented secondary acute respiratory failure in setting of pneumonia. She developed an acute kidney injury which has led to oliguria. Discussions with family have led to no escalation of care, but full comfort recommendations are not clear at this time.    Assessment & Plan:   Principal Problem:   Sepsis due to pneumonia [facility]) Active Problems:   PAF (paroxysmal atrial fibrillation) (HCC)   Type 2 diabetes mellitus with hyperlipidemia (HCC)   Acute metabolic encephalopathy   AKI (acute kidney injury) ([place])   Acute combined systolic and diastolic CHF, NYHA class 1 (HCC)   Abnormal LFTs   Lactic acidosis   Pulseless ventricular tachycardia (HCC)   Macrocytic anemia   Advanced care planning/counseling discussion   Palliative care by specialist   Terminal care   Sepsis Secondary to pneumonia. Treated empirically with vancomycin/cefepime which were discontinued with a positive RVP as mentioned below. Patient required vasopressors of which she was able to be weaned.  Acute respiratory failure with hypoxia Patient required BiPAP which was weaned off. Currently still requiring oxygen via nasal canula. Goals of care discussions cleared patient course. Will be discontinuing HFNC. Morphine for dyspnea.  Paroxysmal atrial fibrillation On amiodarone, metoprolol and Coumadin as an outpatient. Medications on hold now secondary to change in patient's goals of care.  Pulseless ventricular tachycardia Episodes occurred in ICU. Patient is currently comfort measures from chart review. Telemetry discontinued.  Essential hypertension Currently stable.  Chronic heart failure with reduced EF Stable.  Elevated troponin Thought secondary to demand  ischemia.  Shock liver In setting of hypotension. Currently comfort care.  Acute kidney injury on CKD stage IV Nephrology consulted previously and thought secondary to ATN in setting of hypotension. Patient with poor urine output. Currently not a candidate for hemodialysis. Per chart review, this was discussed with family.  Pneumonia Empirically treated with empiric antibiotics as mentioned above. RVP significant for metapneumovirus and antibiotics discontinued.  Anion gap/non-anion gap metabolic acidosis Resolved with sodium bicarb infusion.  Pressure injury Left buttock and coccyx, not present on admission.   DVT prophylaxis: Heparin drip previously. If confirmed comfort care, will not have prophylaxis Code Status:   Code Status: DNR Family Communication: Niece and caregiver at bedside.  Disposition Plan: Comfort measures   Consultants:   PCCM  Nephrology  Palliative care medicine  Procedures:   None  Antimicrobials:  Vancomycin  Cefepime  Levaquin   Subjective: No issues overnight.  Objective: Vitals:   11/18/18 1959 11/19/18 0007 11/19/18 0424 11/19/18 0749  BP: 99/60 102/66 (!) 98/57 (!) 112/57  Pulse: 98 96 67 74  Resp: 15 16 14 16   Temp: 97.7 F (36.5 C) 97.6 F (36.4 C) (!) 97.5 F (36.4 C)   TempSrc: Axillary Axillary Axillary   SpO2: 96% 100% 98% 94%  Weight:   69.6 kg   Height:        Intake/Output Summary (Last 24 hours) at 11/19/2018 1535 Last data filed at 11/19/2018 0430 Gross per 24 hour  Intake 245.71 ml  Output 350 ml  Net -104.29 ml   Filed Weights   11/16/18 0330 11/17/18 1958 11/19/18 0424  Weight: 66.4 kg 70.1 kg 69.6 kg    Examination:  General exam: Appears calm and comfortable  Respiratory system: Mild accessory muscle  usage   Data Reviewed: I have personally reviewed following labs and imaging studies  CBC: Recent Labs  Lab 11/29/2018 2230 11/16/18 0608 11/17/18 0405 11/18/18 1042  WBC 7.4 7.6 5.7 6.3   NEUTROABS 6.1  --   --   --   HGB 8.8* 8.7* 7.6* 8.0*  HCT 32.6* 31.3* 25.2* 26.7*  MCV 100.0 98.1 93.0 92.1  PLT 188 192 185 619   Basic Metabolic Panel: Recent Labs  Lab 11/14/2018 2230 11/16/18 0429 11/16/18 0608 11/16/18 1314 11/17/18 0815  NA 134* 134* 137 140 139  K 5.4* 4.8 4.7 4.3 4.2  CL 105 107 109 108 103  CO2 15* 12* 12* 15* 23  GLUCOSE 230* 256* 236* 126* 191*  BUN 49* 47* 49* 52* 53*  CREATININE 2.47* 2.44* 2.63* 2.76* 3.02*  CALCIUM 8.2* 7.7* 7.6* 7.8* 8.4*  MG  --  1.7 1.5* 2.1  --   PHOS  --  5.2* 5.2*  --   --    GFR: Estimated Creatinine Clearance: 11.6 mL/min (A) (by C-G formula based on SCr of 3.02 mg/dL (H)). Liver Function Tests: Recent Labs  Lab 12/03/2018 2230 11/16/18 0429  AST 367* 809*  ALT 236* 431*  ALKPHOS 98 120  BILITOT 1.0 1.0  PROT 6.4* 5.9*  ALBUMIN 2.7* 2.4*   No results for input(s): LIPASE, AMYLASE in the last 168 hours. No results for input(s): AMMONIA in the last 168 hours. Coagulation Profile: Recent Labs  Lab 11/16/18 0608  INR 1.85   Cardiac Enzymes: Recent Labs  Lab 11/16/18 0429 11/16/18 0608 11/16/18 1314  TROPONINI 0.06* 0.05* 0.05*   BNP (last 3 results) No results for input(s): PROBNP in the last 8760 hours. HbA1C: No results for input(s): HGBA1C in the last 72 hours. CBG: Recent Labs  Lab 11/18/18 1617 11/18/18 2000 11/19/18 0015 11/19/18 0434 11/19/18 0747  GLUCAP 136* 129* 101* 73 84   Lipid Profile: No results for input(s): CHOL, HDL, LDLCALC, TRIG, CHOLHDL, LDLDIRECT in the last 72 hours. Thyroid Function Tests: No results for input(s): TSH, T4TOTAL, FREET4, T3FREE, THYROIDAB in the last 72 hours. Anemia Panel: No results for input(s): VITAMINB12, FOLATE, FERRITIN, TIBC, IRON, RETICCTPCT in the last 72 hours. Sepsis Labs: Recent Labs  Lab 11/16/18 0429  11/16/18 1314 11/16/18 1627 11/17/18 0815 11/17/18 1038  PROCALCITON 6.25  --   --   --   --   --   LATICACIDVEN  --    < > 4.3*  3.4* 2.3* 2.5*   < > = values in this interval not displayed.    Recent Results (from the past 240 hour(s))  Blood Culture (routine x 2)     Status: None (Preliminary result)   Collection Time: 11/20/2018 10:14 PM  Result Value Ref Range Status   Specimen Description BLOOD LEFT HAND  Final   Special Requests   Final    BOTTLES DRAWN AEROBIC ONLY Blood Culture adequate volume   Culture   Final    NO GROWTH 3 DAYS Performed at [facility], [address], [place], [place] [address]    Report Status PENDING  Incomplete  Blood Culture (routine x 2)     Status: None (Preliminary result)   Collection Time: 11/23/2018 10:19 PM  Result Value Ref Range Status   Specimen Description BLOOD RIGHT HAND  Final   Special Requests   Final    BOTTLES DRAWN AEROBIC AND ANAEROBIC Blood Culture results may not be optimal due to an inadequate volume  of blood received in culture bottles   Culture   Final    NO GROWTH 3 DAYS Performed at [facility], [place] [address] [place], [place] [address]    Report Status PENDING  Incomplete  Culture, blood (routine x 2)     Status: None (Preliminary result)   Collection Time: 11/16/18  4:42 AM  Result Value Ref Range Status   Specimen Description BLOOD LEFT HAND  Final   Special Requests   Final    BOTTLES DRAWN AEROBIC ONLY Blood Culture results may not be optimal due to an inadequate volume of blood received in culture bottles   Culture   Final    NO GROWTH 3 DAYS Performed at [facility], [place] [address] [place], [place] [address]    Report Status PENDING  Incomplete  Culture, blood (routine x 2)     Status: None (Preliminary result)   Collection Time: 11/16/18  4:48 AM  Result Value Ref Range Status   Specimen Description BLOOD RIGHT WRIST  Final   Special Requests   Final    BOTTLES DRAWN AEROBIC AND ANAEROBIC Blood Culture adequate volume   Culture   Final    NO GROWTH 3 DAYS Performed at [facility], [address]  [place], [place] [address]    Report Status PENDING  Incomplete  MRSA PCR Screening     Status: Abnormal   Collection Time: 11/16/18  5:14 AM  Result Value Ref Range Status   MRSA by PCR POSITIVE (A) NEGATIVE Final    Comment:        The GeneXpert MRSA Assay (FDA approved for NASAL specimens only), is one component of a comprehensive MRSA colonization surveillance program. It is not intended to diagnose MRSA infection nor to guide or monitor treatment for MRSA infections. RESULT CALLED TO, READ BACK BY AND VERIFIED WITH: RN Wilhemina Cash [id number] MLM Performed at [facility], [place] [address], [place], [place] [address]   Culture, Urine     Status: None   Collection Time: 11/16/18  8:15 AM  Result Value Ref Range Status   Specimen Description URINE, CATHETERIZED  Final   Special Requests ADD  Final   Culture   Final    NO GROWTH Performed at [facility], [place] [address], [place],  [address]    Report Status 11/17/2018 FINAL  Final  Respiratory Panel by PCR     Status: Abnormal   Collection Time: 11/16/18 12:45 PM  Result Value Ref Range Status   Adenovirus NOT DETECTED NOT DETECTED Final   Coronavirus 229E NOT DETECTED NOT DETECTED Final   Coronavirus HKU1 NOT DETECTED NOT DETECTED Final   Coronavirus NL63 NOT DETECTED NOT DETECTED Final   Coronavirus OC43 NOT DETECTED NOT DETECTED Final   Metapneumovirus DETECTED (A) NOT DETECTED Final   Rhinovirus / Enterovirus NOT DETECTED NOT DETECTED Final   Influenza A NOT DETECTED NOT DETECTED Final   Influenza B NOT DETECTED NOT DETECTED Final   Parainfluenza Virus 1 NOT DETECTED NOT DETECTED Final   Parainfluenza Virus 2 NOT DETECTED NOT DETECTED Final   Parainfluenza Virus 3 NOT DETECTED NOT DETECTED Final   Parainfluenza Virus 4 NOT DETECTED NOT DETECTED Final   Respiratory Syncytial Virus NOT DETECTED NOT DETECTED Final   Bordetella pertussis NOT DETECTED NOT DETECTED Final   Chlamydophila pneumoniae NOT  DETECTED NOT DETECTED Final   Mycoplasma pneumoniae NOT DETECTED NOT DETECTED Final  Comment: Performed at [facility], [place] [address] [place], [place], [place] [address]         Radiology Studies: No results found.      Scheduled Meds: . chlorhexidine  15 mL Mouth Rinse BID   Continuous Infusions:    LOS: 3 days     Cordelia Poche, MD [facility] 11/19/2018, 3:35 PM  If 7PM-7AM, please contact night-coverage [email or web address]

## 2018-11-20 MED ORDER — HYDROMORPHONE HCL 1 MG/ML IJ SOLN: 0.5000 mg | Freq: Four times a day (QID) | INTRAMUSCULAR | Status: DC

## 2018-11-20 MED ORDER — HYDROMORPHONE HCL 1 MG/ML IJ SOLN
0.5000 mg | Freq: Four times a day (QID) | INTRAMUSCULAR | Status: DC
  Administered 2018-11-20 (×2): 0.5 mg via INTRAVENOUS
  Filled 2018-11-20 (×2): qty 1

## 2018-11-20 NOTE — Progress Notes (Addendum)
PROGRESS NOTE    Zanyla Klebba  TIW:[id number] DOB: 02/26/1925 DOA: 11/19/2018 PCP: No primary care provider on file.   Brief Narrative: Dorea Duff is a 85 y.o. female with a history of atrial fibrillation, diabetes mellitus, CKD. Patient presented secondary acute respiratory failure in setting of pneumonia. She developed an acute kidney injury which has led to oliguria. Discussions with family have led to no escalation of care, but full comfort recommendations are not clear at this time.    Assessment & Plan:   Principal Problem:   Sepsis due to pneumonia [facility]) Active Problems:   PAF (paroxysmal atrial fibrillation) (HCC)   Type 2 diabetes mellitus with hyperlipidemia (HCC)   Acute metabolic encephalopathy   AKI (acute kidney injury) ([place])   Acute combined systolic and diastolic CHF, NYHA class 1 (HCC)   Abnormal LFTs   Lactic acidosis   Pulseless ventricular tachycardia (HCC)   Macrocytic anemia   Advanced care planning/counseling discussion   Palliative care by specialist   Terminal care   Sepsis Secondary to pneumonia. Treated empirically with vancomycin/cefepime which were discontinued with a positive RVP as mentioned below. Patient required vasopressors of which she was able to be weaned.  Acute respiratory failure with hypoxia Patient required BiPAP which was weaned off. Currently still requiring oxygen via nasal canula. Goals of care discussions cleared patient course. Will be discontinuing HFNC. Morphine for dyspnea.  Paroxysmal atrial fibrillation On amiodarone, metoprolol and Coumadin as an outpatient. Medications on hold now secondary to change in patient's goals of care.  Pulseless ventricular tachycardia Episodes occurred in ICU. Patient is currently comfort measures from chart review. Telemetry discontinued.  Essential hypertension Currently stable.  Chronic heart failure with reduced EF Stable.  Elevated troponin Thought secondary to demand  ischemia.  Shock liver In setting of hypotension. Currently comfort care.  Acute kidney injury on CKD stage IV Nephrology consulted previously and thought secondary to ATN in setting of hypotension. Patient with poor urine output. Currently not a candidate for hemodialysis. Per chart review, this was discussed with family.  Pneumonia Empirically treated with empiric antibiotics as mentioned above. RVP significant for metapneumovirus and antibiotics discontinued.  Anion gap/non-anion gap metabolic acidosis Resolved with sodium bicarb infusion.  Pressure injury Left buttock and coccyx initially documented as not present on admission, however, information was incorrect. No pressure ulcer assessed on this admission.   DVT prophylaxis: None, comfort care Code Status:   Code Status: DNR Family Communication: Niece and caregiver at bedside.  Disposition Plan: Comfort measures   Consultants:   PCCM  Nephrology  Palliative care medicine  Procedures:   None  Antimicrobials:  Vancomycin  Cefepime  Levaquin   Subjective: Still on oxygen overnight. Has some episodes of dyspnea per niece.  Objective: Vitals:   11/18/18 1959 11/19/18 0007 11/19/18 0424 11/19/18 0749  BP: 99/60 102/66 (!) 98/57 (!) 112/57  Pulse: 98 96 67 74  Resp: 15 16 14 16   Temp: 97.7 F (36.5 C) 97.6 F (36.4 C) (!) 97.5 F (36.4 C)   TempSrc: Axillary Axillary Axillary   SpO2: 96% 100% 98% 94%  Weight:   69.6 kg   Height:        Intake/Output Summary (Last 24 hours) at 11/20/2018 1411 Last data filed at 11/20/2018 0951 Gross per 24 hour  Intake 0 ml  Output -  Net 0 ml   Filed Weights   11/16/18 0330 11/17/18 1958 11/19/18 0424  Weight: 66.4 kg 70.1 kg 69.6 kg    Examination:  General: Well appearing, no distress currently  Data Reviewed: I have personally reviewed following labs and imaging studies  CBC: Recent Labs  Lab 11/25/2018 2230 11/16/18 0608 11/17/18 0405  11/18/18 1042  WBC 7.4 7.6 5.7 6.3  NEUTROABS 6.1  --   --   --   HGB 8.8* 8.7* 7.6* 8.0*  HCT 32.6* 31.3* 25.2* 26.7*  MCV 100.0 98.1 93.0 92.1  PLT 188 192 185 433   Basic Metabolic Panel: Recent Labs  Lab 11/13/2018 2230 11/16/18 0429 11/16/18 0608 11/16/18 1314 11/17/18 0815  NA 134* 134* 137 140 139  K 5.4* 4.8 4.7 4.3 4.2  CL 105 107 109 108 103  CO2 15* 12* 12* 15* 23  GLUCOSE 230* 256* 236* 126* 191*  BUN 49* 47* 49* 52* 53*  CREATININE 2.47* 2.44* 2.63* 2.76* 3.02*  CALCIUM 8.2* 7.7* 7.6* 7.8* 8.4*  MG  --  1.7 1.5* 2.1  --   PHOS  --  5.2* 5.2*  --   --    GFR: Estimated Creatinine Clearance: 11.6 mL/min (A) (by C-G formula based on SCr of 3.02 mg/dL (H)). Liver Function Tests: Recent Labs  Lab 11/20/2018 2230 11/16/18 0429  AST 367* 809*  ALT 236* 431*  ALKPHOS 98 120  BILITOT 1.0 1.0  PROT 6.4* 5.9*  ALBUMIN 2.7* 2.4*   No results for input(s): LIPASE, AMYLASE in the last 168 hours. No results for input(s): AMMONIA in the last 168 hours. Coagulation Profile: Recent Labs  Lab 11/16/18 0608  INR 1.85   Cardiac Enzymes: Recent Labs  Lab 11/16/18 0429 11/16/18 0608 11/16/18 1314  TROPONINI 0.06* 0.05* 0.05*   BNP (last 3 results) No results for input(s): PROBNP in the last 8760 hours. HbA1C: No results for input(s): HGBA1C in the last 72 hours. CBG: Recent Labs  Lab 11/18/18 1617 11/18/18 2000 11/19/18 0015 11/19/18 0434 11/19/18 0747  GLUCAP 136* 129* 101* 73 84   Lipid Profile: No results for input(s): CHOL, HDL, LDLCALC, TRIG, CHOLHDL, LDLDIRECT in the last 72 hours. Thyroid Function Tests: No results for input(s): TSH, T4TOTAL, FREET4, T3FREE, THYROIDAB in the last 72 hours. Anemia Panel: No results for input(s): VITAMINB12, FOLATE, FERRITIN, TIBC, IRON, RETICCTPCT in the last 72 hours. Sepsis Labs: Recent Labs  Lab 11/16/18 0429  11/16/18 1314 11/16/18 1627 11/17/18 0815 11/17/18 1038  PROCALCITON 6.25  --   --   --   --    --   LATICACIDVEN  --    < > 4.3* 3.4* 2.3* 2.5*   < > = values in this interval not displayed.    Recent Results (from the past 240 hour(s))  Blood Culture (routine x 2)     Status: None (Preliminary result)   Collection Time: 12/10/2018 10:14 PM  Result Value Ref Range Status   Specimen Description BLOOD LEFT HAND  Final   Special Requests   Final    BOTTLES DRAWN AEROBIC ONLY Blood Culture adequate volume   Culture   Final    NO GROWTH 4 DAYS Performed at [facility], [address], [place], [place] [address]    Report Status PENDING  Incomplete  Blood Culture (routine x 2)     Status: None (Preliminary result)   Collection Time: 11/12/2018 10:19 PM  Result Value Ref Range Status   Specimen Description BLOOD RIGHT HAND  Final   Special Requests   Final    BOTTLES DRAWN AEROBIC AND ANAEROBIC Blood Culture results may not be optimal due  to an inadequate volume of blood received in culture bottles   Culture   Final    NO GROWTH 4 DAYS Performed at [facility], [place] [address] [place], [place] [address]    Report Status PENDING  Incomplete  Culture, blood (routine x 2)     Status: None (Preliminary result)   Collection Time: 11/16/18  4:42 AM  Result Value Ref Range Status   Specimen Description BLOOD LEFT HAND  Final   Special Requests   Final    BOTTLES DRAWN AEROBIC ONLY Blood Culture results may not be optimal due to an inadequate volume of blood received in culture bottles   Culture   Final    NO GROWTH 4 DAYS Performed at [facility], [place] [address] [place], [place] [address]    Report Status PENDING  Incomplete  Culture, blood (routine x 2)     Status: None (Preliminary result)   Collection Time: 11/16/18  4:48 AM  Result Value Ref Range Status   Specimen Description BLOOD RIGHT WRIST  Final   Special Requests   Final    BOTTLES DRAWN AEROBIC AND ANAEROBIC Blood Culture adequate volume   Culture   Final    NO GROWTH 4 DAYS Performed at [facility], [place] [address], [place], [place] [address]    Report Status PENDING  Incomplete  MRSA PCR Screening     Status: Abnormal   Collection Time: 11/16/18  5:14 AM  Result Value Ref Range Status   MRSA by PCR POSITIVE (A) NEGATIVE Final    Comment:        The GeneXpert MRSA Assay (FDA approved for NASAL specimens only), is one component of a comprehensive MRSA colonization surveillance program. It is not intended to diagnose MRSA infection nor to guide or monitor treatment for MRSA infections. RESULT CALLED TO, READ BACK BY AND VERIFIED WITH: RN Wilhemina Cash [phone] MLM Performed at [facility], [place] [address] [place], [place] [address]   Culture, Urine     Status: None   Collection Time: 11/16/18  8:15 AM  Result Value Ref Range Status   Specimen Description URINE, CATHETERIZED  Final   Special Requests ADD  Final   Culture   Final    NO GROWTH Performed at [facility], [place] [address], [place], [place] [address]    Report Status 11/17/2018 FINAL  Final  Respiratory Panel by PCR     Status: Abnormal   Collection Time: 11/16/18 12:45 PM  Result Value Ref Range Status   Adenovirus NOT DETECTED NOT DETECTED Final   Coronavirus 229E NOT DETECTED NOT DETECTED Final   Coronavirus HKU1 NOT DETECTED NOT DETECTED Final   Coronavirus NL63 NOT DETECTED NOT DETECTED Final   Coronavirus OC43 NOT DETECTED NOT DETECTED Final   Metapneumovirus DETECTED (A) NOT DETECTED Final   Rhinovirus / Enterovirus NOT DETECTED NOT DETECTED Final   Influenza A NOT DETECTED NOT DETECTED Final   Influenza B NOT DETECTED NOT DETECTED Final   Parainfluenza Virus 1 NOT DETECTED NOT DETECTED Final   Parainfluenza Virus 2 NOT DETECTED NOT DETECTED Final   Parainfluenza Virus 3 NOT DETECTED NOT DETECTED Final   Parainfluenza Virus 4 NOT DETECTED NOT DETECTED Final   Respiratory Syncytial Virus NOT DETECTED NOT DETECTED Final   Bordetella pertussis NOT DETECTED NOT DETECTED Final    Chlamydophila pneumoniae NOT DETECTED NOT DETECTED Final   Mycoplasma pneumoniae NOT DETECTED NOT DETECTED  Final    Comment: Performed at [facility], [place] [address], [place], [place] [address]         Radiology Studies: No results found.      Scheduled Meds: . chlorhexidine  15 mL Mouth Rinse BID  .  HYDROmorphone (DILAUDID) injection  0.5 mg Intravenous Q6H   Continuous Infusions:    LOS: 4 days     Cordelia Poche, MD [facility] 11/20/2018, 2:11 PM  If 7PM-7AM, please contact night-coverage [email or web address]

## 2018-11-20 NOTE — Progress Notes (Addendum)
Daily Progress Note   Patient Name: Kylie Sanchez       Date: 11/20/2018 DOB: May 29, 1925  Age: 85 y.o. MRN#: [id number] Attending Physician: Mariel Aloe, MD Primary Care Physician: No primary care provider on file. Admit Date: 11/14/2018  Reason for Consultation/Follow-up: Establishing goals of care and Terminal Care  Subjective: Patient in bed. Awakens briefly to my voice. Per niece she is not eating or drinking. Some difficulty breathing this morning, relieved with hydromorphone- patient had removed oxygen. We discussed discontinuing the oxygen by nasal cannula and using only the hydromorphone and lorazepam for shortness of breath. [name] in agreement.   Review of Systems  Unable to perform ROS: Acuity of condition    Length of Stay: 4  Current Medications: Scheduled Meds:  . chlorhexidine  15 mL Mouth Rinse BID    Continuous Infusions:   PRN Meds: glycopyrrolate **OR** glycopyrrolate **OR** glycopyrrolate, HYDROmorphone (DILAUDID) injection, LORazepam **OR** [DISCONTINUED] LORazepam **OR** LORazepam, ondansetron **OR** ondansetron (ZOFRAN) IV  Physical Exam Vitals signs and nursing note reviewed.  Constitutional:      Appearance: She is ill-appearing.     Comments: Diffuse anasarca  HENT:     Mouth/Throat:     Mouth: Mucous membranes are dry.  Skin:    Coloration: Skin is pale.  Neurological:     Mental Status: She is disoriented.             Vital Signs: BP (!) 112/57 (BP Location: Left Arm)   Pulse 74   Temp (!) 97.5 F (36.4 C) (Axillary)   Resp 16   Ht 5\' 4"  (1.626 m)   Wt 69.6 kg   SpO2 94%   BMI 26.33 kg/m  SpO2: SpO2: 94 % O2 Device: O2 Device: Nasal Cannula O2 Flow Rate: O2 Flow Rate (L/min): 4 L/min  Intake/output summary:    Intake/Output Summary (Last 24 hours) at 11/20/2018 1103 Last data filed at 11/20/2018 0951 Gross per 24 hour  Intake 0 ml  Output -  Net 0 ml   LBM: Last BM Date: (unknown) Baseline Weight: Weight: 59 kg Most recent weight: Weight: 69.6 kg       Palliative Assessment/Data: PPS: 10%    Flowsheet Rows     Most Recent Value  Intake Tab  Referral Department  Hospitalist  Unit at Time of  Referral  Cardiac/Telemetry Unit  Palliative Care Primary Diagnosis  Nephrology  Date Notified  11/18/18  Palliative Care Type  Return patient Palliative Care  Reason for referral  Clarify Goals of Care  Date of Admission  11/16/18  # of days IP prior to Palliative referral  2  Clinical Assessment  Psychosocial & Spiritual Assessment  Palliative Care Outcomes      Patient Active Problem List   Diagnosis Date Noted  . Advanced care planning/counseling discussion   . Palliative care by specialist   . Terminal care   . Abnormal LFTs 11/16/2018  . Lactic acidosis 11/16/2018  . Pulseless ventricular tachycardia ([place]) 11/16/2018  . Macrocytic anemia 11/16/2018  . AKI (acute kidney injury) ([place])   . Acute combined systolic and diastolic CHF, NYHA class 1 ([place])   . Acute metabolic encephalopathy 24/07/7352  . Demand ischemia ([place])   . Sepsis due to pneumonia ([place]) 04/04/2018  . Acute lower UTI 03/24/2018  . Closed right hip fracture, initial encounter ([place]) 03/22/2018  . Hip fracture ([place]) 03/22/2018  . Closed fracture of right hip ([place])   . Warfarin-induced coagulopathy ([place])   . Mild cognitive impairment 01/22/2018  . Afib ([place]) 11/17/2017  . Depression, major, recurrent, in partial remission ([place]) 11/17/2017  . Palliative care encounter   . Goals of care, counseling/discussion   . Pressure injury of skin 05/22/2017  . Dilated cardiomyopathy ([place]) 05/20/2017  . Elevated troponin 05/19/2017  . Asymptomatic stenosis of right carotid artery 05/13/2017  . H/O enucleation of left  eyeball 10/30/2015  . Macular degeneration, right eye 10/30/2015  . Pseudophakia, right eye 10/30/2015  . Basal cell carcinoma 02/10/2015  . DM neuropathy, type II diabetes mellitus ([place]) 02/10/2015  . Persistent atrial fibrillation 02/03/2015  . Type 2 diabetes mellitus with hyperlipidemia ([place]) 11/24/2014  . T2_NIDDM w/Stage 3 CKD (GFR 40 ml/min) 11/08/2014  . Vitamin D deficiency 11/08/2014  . Medication management 11/08/2014  . BP (high blood pressure) 11/08/2014  . Hyperlipidemia   . Sick sinus syndrome ([place])   . Retinopathy   . Long term current use of anticoagulant therapy 04/30/2012  . Tachycardia-bradycardia ([place]) 04/19/2012  . PAF (paroxysmal atrial fibrillation) ([place]) 04/18/2011  . HCD (hypertensive cardiovascular disease) 04/18/2011    Palliative Care Assessment & Plan   Patient Profile:  85 y.o. female  with past medical history of DM2, atrial fib, CKD, s/p hip fracture repair, CHFrEF, and residing at [facility] admitted on 11/17/2018 after being found unresponsive. Workup revealed sepsis with pneumonia resulting in acute kidney failure, acute respiratory failure, shock liver. She required IV pressors that have been weaned, Bipap that has been weaned however, oxygen requirements continue to be high- she is currently on 6L HFNC. She has been oliguric and family has stated she would not want dialysis. She continues to receive supportive care with IV fluids and O2 titration. Palliative medicine consulted for continued GOC.  Assessment/Recommendations/Plan   D/C O2 by nasal cannula- use hydromorphone and lorazepam for SOB  Will schedule IV hydromorphone as well for symptom management  Patient needs continued hospitalization for symptom management- unstable for transfer  Goals of Care and Additional Recommendations:  Limitations on Scope of Treatment: Full Comfort Care  Code Status:  DNR  Prognosis:   Hours - Days  Discharge Planning:  Anticipated Hospital Death  Care  plan was discussed with patient's niece and patient RNVenora Maples.  Thank you for allowing the Palliative Medicine Team to assist in the care of this patient.  Time In: 1025 Time Out: 1100 Total Time 35 minutes Prolonged Time Billed no      Greater than 50%  of this time was spent counseling and coordinating care related to the above assessment and plan.  Mariana Kaufman, AGNP-C Palliative Medicine   Please contact Palliative Medicine Team phone at [phone] for questions and concerns.

## 2018-11-20 NOTE — Progress Notes (Signed)
   11/20/18 1000  Clinical Encounter Type  Visited With Patient and family together  Visit Type Initial  Referral From Nurse  Spiritual Encounters  Spiritual Needs Prayer  Responded to [facility] consult. Patient was sitting up in bed and niece was at bedside. Niece said patient was heavily medicated and thus confusion. Prayed with patient and provided emotional support.

## 2018-11-21 LAB — CULTURE, BLOOD (ROUTINE X 2)
CULTURE: NO GROWTH
Culture: NO GROWTH
Culture: NO GROWTH
Culture: NO GROWTH
Special Requests: ADEQUATE
Special Requests: ADEQUATE

## 2018-11-24 LAB — CUP PACEART REMOTE DEVICE CHECK
Battery Impedance: 428 Ohm
Battery Remaining Longevity: 104 mo
Battery Voltage: 2.77 V
Brady Statistic AP VP Percent: 0 %
Brady Statistic AP VS Percent: 10 %
Brady Statistic AS VP Percent: 0 %
Brady Statistic AS VS Percent: 90 %
Date Time Interrogation Session: 20191118163747
Implantable Lead Implant Date: 20130610
Implantable Lead Implant Date: 20130610
Implantable Lead Location: 753859
Implantable Lead Location: 753860
Implantable Lead Model: 5076
Implantable Lead Model: 5092
Implantable Pulse Generator Implant Date: 20130610
Lead Channel Impedance Value: 300 Ohm
Lead Channel Impedance Value: 661 Ohm
Lead Channel Pacing Threshold Amplitude: 0.875 V
Lead Channel Pacing Threshold Amplitude: 1.125 V
Lead Channel Pacing Threshold Pulse Width: 0.4 ms
Lead Channel Pacing Threshold Pulse Width: 0.4 ms
Lead Channel Setting Pacing Amplitude: 2 V
Lead Channel Setting Pacing Amplitude: 2.5 V
Lead Channel Setting Pacing Pulse Width: 0.4 ms
Lead Channel Setting Sensing Sensitivity: 5.6 mV

## 2018-12-11 ENCOUNTER — Encounter: Payer: Medicare Other | Admitting: Nurse Practitioner

## 2018-12-12 NOTE — Death Summary Note (Signed)
DEATH SUMMARY   Patient Details  Name: Kylie Sanchez MRN: [id number] DOB: 1925-09-29  Admission/Discharge Information   Admit Date:  Dec 12, 2018  Date of Death: Date of Death: 12/18/2018  Time of Death: Time of Death: 02/26/58  Length of Stay: 5  Referring Physician: No primary care provider on file.   Reason(s) for Hospitalization  Acute respiratory failure  Diagnoses  Preliminary cause of death:  Secondary Diagnoses (including complications and co-morbidities):  Principal Problem:   Sepsis due to pneumonia ([place]) Active Problems:   PAF (paroxysmal atrial fibrillation) (HCC)   Type 2 diabetes mellitus with hyperlipidemia (HCC)   Acute metabolic encephalopathy   AKI (acute kidney injury) ([place])   Acute combined systolic and diastolic CHF, NYHA class 1 (HCC)   Abnormal LFTs   Lactic acidosis   Pulseless ventricular tachycardia ([place])   Macrocytic anemia   Advanced care planning/counseling discussion   Palliative care by specialist   Terminal care   Brief Hospital Course (including significant findings, care, treatment, and services provided and events leading to death)  Kylie Sanchez is a 85 y.o. year old female with a history of atrial fibrillation, diabetes mellitus, CKD.  Patient presented secondary to shortness of breath found to have acute respiratory failure secondary hypoxia in addition to sepsis in the setting of pneumonia.  She was empirically treated with vancomycin and cefepime, however, RVP was positive for metapneumovirus.  While in the ICU, patient had episodes of pulseless ventricular tachycardia in addition to acute kidney injury in setting of hypotension and continued respiratory failure with hypoxia.  Patient continued to decline and was requiring increasing amounts of oxygen.  Goals of care discussions were had and patient made full comfort care.    Pertinent Labs and Studies  Significant Diagnostic Studies Dg Chest Portable 1 View  Result Date: 2018/12/12 CLINICAL DATA:   Hypotension, shortness of breath, fever to 101 degrees, code sepsis EXAM: PORTABLE CHEST 1 VIEW COMPARISON:  Portable exam at 2225 hrs compared to 04/04/2018 FINDINGS: Left subclavian sequential transvenous pacemaker leads project at right atrium and right ventricle. Normal heart size, mediastinal contours, and pulmonary vascularity. Rotated to the LEFT. Atherosclerotic calcification aorta. Patchy infiltrates RIGHT upper and RIGHT lower lobes consistent with pneumonia. Chronic accentuation of LEFT basilar markings. No definite pleural effusion or pneumothorax. Bones demineralized. IMPRESSION: RIGHT lung infiltrates consistent with pneumonia. Electronically Signed   By: Lavonia Dana M.D.   On: 12-12-2018 22:39    Microbiology Recent Results (from the past 240 hour(s))  Blood Culture (routine x 2)     Status: None   Collection Time: 2018/12/12 10:14 PM  Result Value Ref Range Status   Specimen Description BLOOD LEFT HAND  Final   Special Requests   Final    BOTTLES DRAWN AEROBIC ONLY Blood Culture adequate volume   Culture   Final    NO GROWTH 5 DAYS Performed at [facility], [address], [place], [place] [address]    Report Status 12-18-18 FINAL  Final  Blood Culture (routine x 2)     Status: None   Collection Time: 2018-12-12 10:19 PM  Result Value Ref Range Status   Specimen Description BLOOD RIGHT HAND  Final   Special Requests   Final    BOTTLES DRAWN AEROBIC AND ANAEROBIC Blood Culture results may not be optimal due to an inadequate volume of blood received in culture bottles   Culture   Final    NO GROWTH 5 DAYS Performed at [facility],  [address], [place], [place] [address]    Report Status Dec 09, 2018 FINAL  Final  Culture, blood (routine x 2)     Status: None   Collection Time: 11/16/18  4:42 AM  Result Value Ref Range Status   Specimen Description BLOOD LEFT HAND  Final   Special Requests   Final    BOTTLES DRAWN AEROBIC ONLY Blood Culture results may not  be optimal due to an inadequate volume of blood received in culture bottles   Culture   Final    NO GROWTH 5 DAYS Performed at [facility], [place] [address], [place], [place] [address]    Report Status 2018/12/09 FINAL  Final  Culture, blood (routine x 2)     Status: None   Collection Time: 11/16/18  4:48 AM  Result Value Ref Range Status   Specimen Description BLOOD RIGHT WRIST  Final   Special Requests   Final    BOTTLES DRAWN AEROBIC AND ANAEROBIC Blood Culture adequate volume   Culture   Final    NO GROWTH 5 DAYS Performed at [facility], [place] [address] [place], [place] [address]    Report Status 12-09-18 FINAL  Final  MRSA PCR Screening     Status: Abnormal   Collection Time: 11/16/18  5:14 AM  Result Value Ref Range Status   MRSA by PCR POSITIVE (A) NEGATIVE Final    Comment:        The GeneXpert MRSA Assay (FDA approved for NASAL specimens only), is one component of a comprehensive MRSA colonization surveillance program. It is not intended to diagnose MRSA infection nor to guide or monitor treatment for MRSA infections. RESULT CALLED TO, READ BACK BY AND VERIFIED WITH: RN Wilhemina Cash [id number] Performed at [facility], [place] [address], [place], [place] [address]   Culture, Urine     Status: None   Collection Time: 11/16/18  8:15 AM  Result Value Ref Range Status   Specimen Description URINE, CATHETERIZED  Final   Special Requests ADD  Final   Culture   Final    NO GROWTH Performed at [facility], [place] [address] [place], [place] [address]    Report Status 11/17/2018 FINAL  Final  Respiratory Panel by PCR     Status: Abnormal   Collection Time: 11/16/18 12:45 PM  Result Value Ref Range Status   Adenovirus NOT DETECTED NOT DETECTED Final   Coronavirus 229E NOT DETECTED NOT DETECTED Final   Coronavirus HKU1 NOT DETECTED NOT DETECTED Final   Coronavirus NL63 NOT DETECTED NOT DETECTED Final   Coronavirus OC43 NOT DETECTED NOT  DETECTED Final   Metapneumovirus DETECTED (A) NOT DETECTED Final   Rhinovirus / Enterovirus NOT DETECTED NOT DETECTED Final   Influenza A NOT DETECTED NOT DETECTED Final   Influenza B NOT DETECTED NOT DETECTED Final   Parainfluenza Virus 1 NOT DETECTED NOT DETECTED Final   Parainfluenza Virus 2 NOT DETECTED NOT DETECTED Final   Parainfluenza Virus 3 NOT DETECTED NOT DETECTED Final   Parainfluenza Virus 4 NOT DETECTED NOT DETECTED Final   Respiratory Syncytial Virus NOT DETECTED NOT DETECTED Final   Bordetella pertussis NOT DETECTED NOT DETECTED Final   Chlamydophila pneumoniae NOT DETECTED NOT DETECTED Final   Mycoplasma pneumoniae NOT DETECTED NOT DETECTED Final    Comment: Performed at [facility], [place] [address], [place], [place] [address]    Lab Basic Metabolic Panel: Recent Labs  Lab 11/23/2018  2230 11/16/18 0429 11/16/18 0608 11/16/18 1314 11/17/18 0815  NA 134* 134* 137 140 139  K 5.4* 4.8 4.7 4.3 4.2  CL 105 107 109 108 103  CO2 15* 12* 12* 15* 23  GLUCOSE 230* 256* 236* 126* 191*  BUN 49* 47* 49* 52* 53*  CREATININE 2.47* 2.44* 2.63* 2.76* 3.02*  CALCIUM 8.2* 7.7* 7.6* 7.8* 8.4*  MG  --  1.7 1.5* 2.1  --   PHOS  --  5.2* 5.2*  --   --    Liver Function Tests: Recent Labs  Lab 12/10/2018 2230 11/16/18 0429  AST 367* 809*  ALT 236* 431*  ALKPHOS 98 120  BILITOT 1.0 1.0  PROT 6.4* 5.9*  ALBUMIN 2.7* 2.4*   No results for input(s): LIPASE, AMYLASE in the last 168 hours. No results for input(s): AMMONIA in the last 168 hours. CBC: Recent Labs  Lab 12/01/2018 2230 11/16/18 0608 11/17/18 0405 11/18/18 1042  WBC 7.4 7.6 5.7 6.3  NEUTROABS 6.1  --   --   --   HGB 8.8* 8.7* 7.6* 8.0*  HCT 32.6* 31.3* 25.2* 26.7*  MCV 100.0 98.1 93.0 92.1  PLT 188 192 185 209   Cardiac Enzymes: Recent Labs  Lab 11/16/18 0429 11/16/18 0608 11/16/18 1314  TROPONINI 0.06* 0.05* 0.05*   Sepsis Labs: Recent Labs  Lab 11/29/2018 2230  11/16/18 0429  11/16/18 0608   11/16/18 1314 11/16/18 1627 11/17/18 0405 11/17/18 0815 11/17/18 1038 11/18/18 1042  PROCALCITON  --   --  6.25  --   --   --   --   --   --   --   --   --   WBC 7.4  --   --   --  7.6  --   --   --  5.7  --   --  6.3  LATICACIDVEN  --    < >  --    < >  --    < > 4.3* 3.4*  --  2.3* 2.5*  --    < > = values in this interval not displayed.    Procedures/Operations     Cordelia Poche, MD 11/22/2018, 7:54 PM

## 2018-12-12 DEATH — deceased

## 2019-05-29 IMAGING — CT CT HEAD W/O CM
4 series · 16 of 47 positions shown, 18 images · non-contrast
Comparison: May 13, 2017

CLINICAL DATA: Status post fall last night.

EXAM:
CT HEAD WITHOUT CONTRAST
TECHNIQUE: Contiguous axial images were obtained from the base of the skull
through the vertex without intravenous contrast.

[Series 3: head without · axial · non-contrast · 0.39mm/px · z∈[-231,-121]mm · 7 of 30 slices shown, 9 images]
[im 4/30  brain]
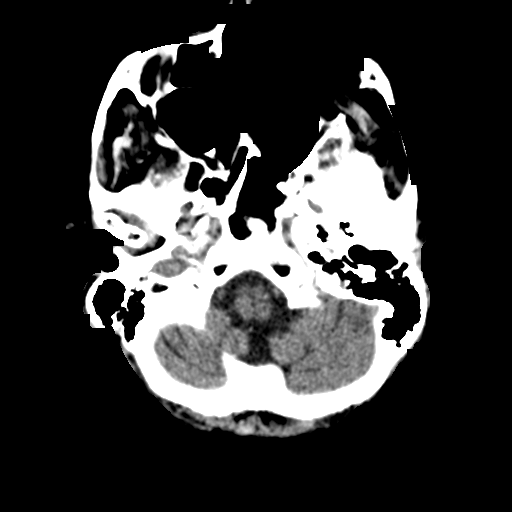
[im 4/30  bone]
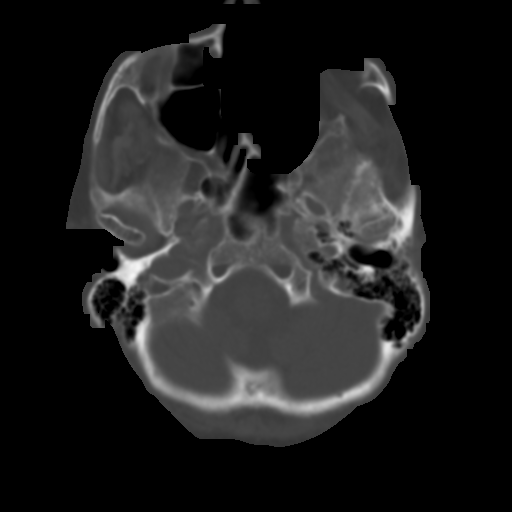
[im 8/30  brain]
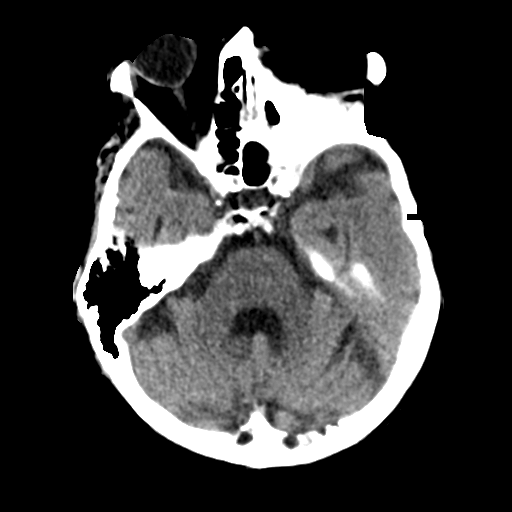
[im 11/30  brain]
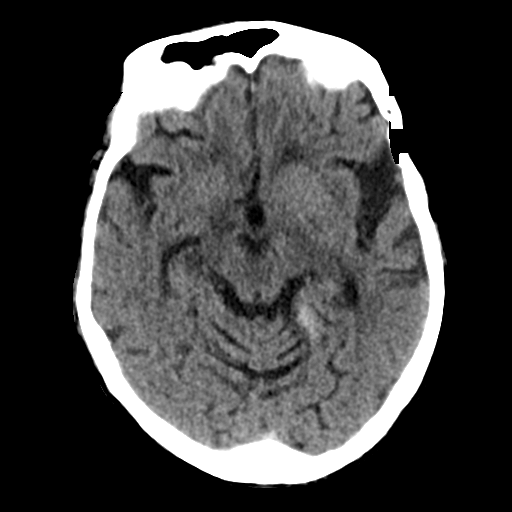
[im 15/30  brain]
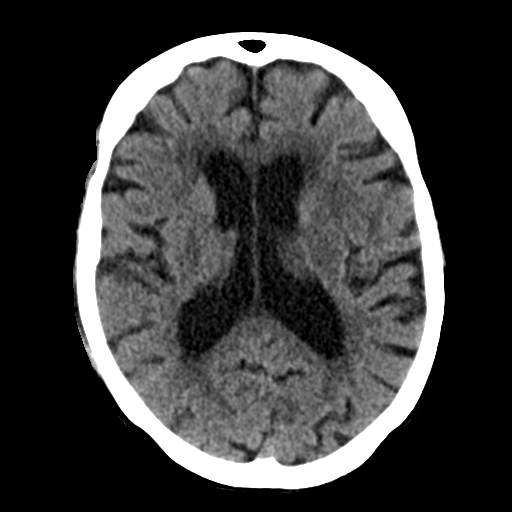
[im 19/30  brain]
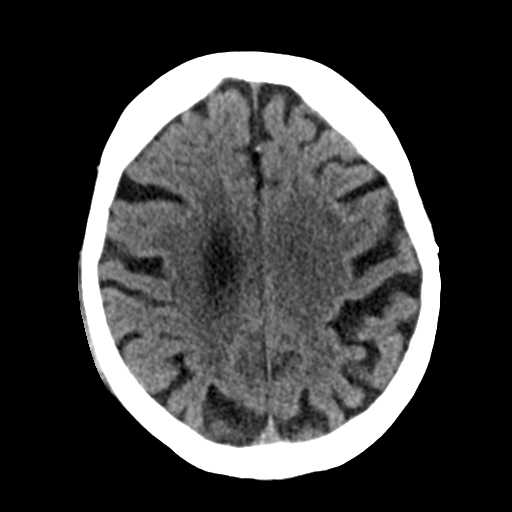
[im 19/30  bone]
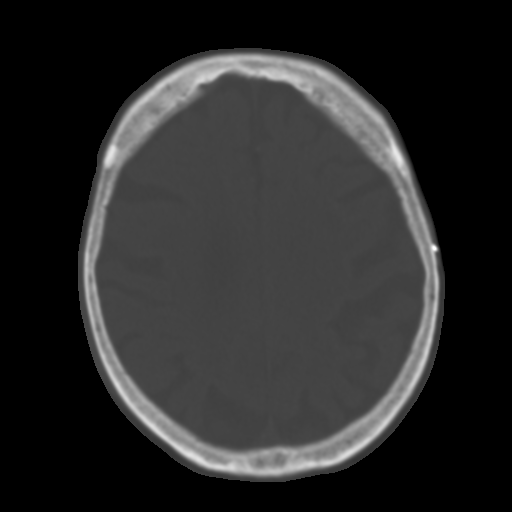
[im 22/30  brain]
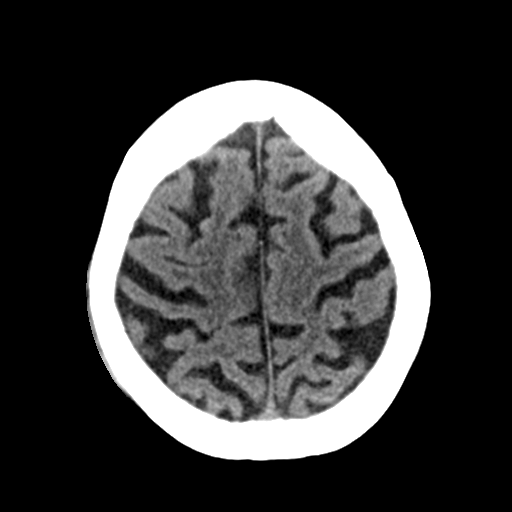
[im 26/30  brain]
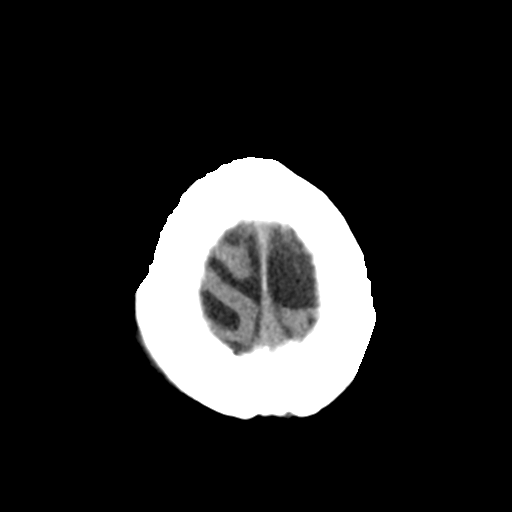

[Series 4: head bone · axial · 0.39mm/px · z∈[-232,-204]mm · 3 of 74 slices shown]
[im 8/74  bone]
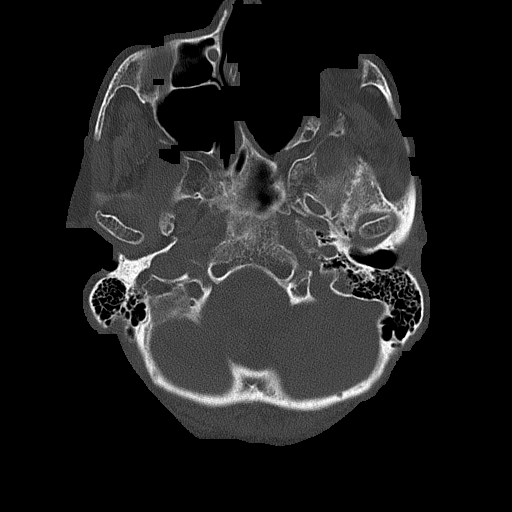
[im 15/74  bone]
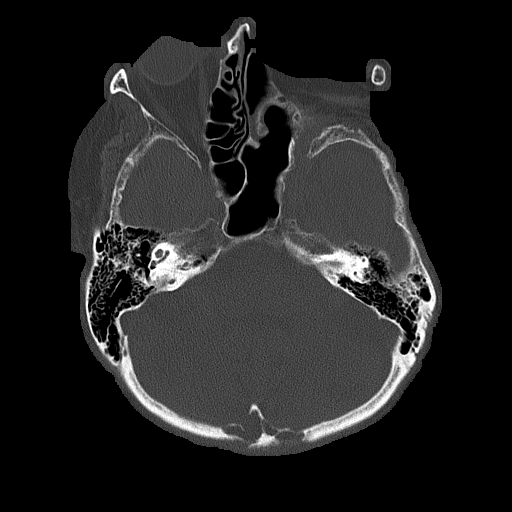
[im 22/74  bone]
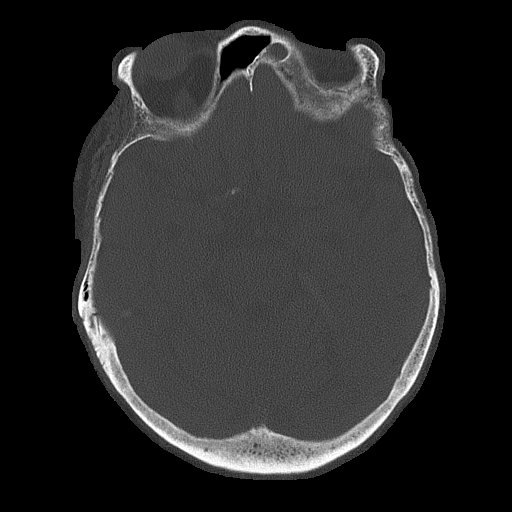

[Series 5: head without cor · coronal · non-contrast · 0.30mm/px · 3 of 67 slices shown]
[im 23/67  brain]
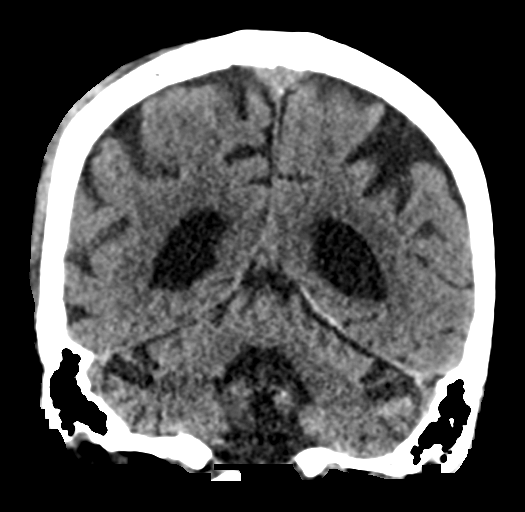
[im 30/67  brain]
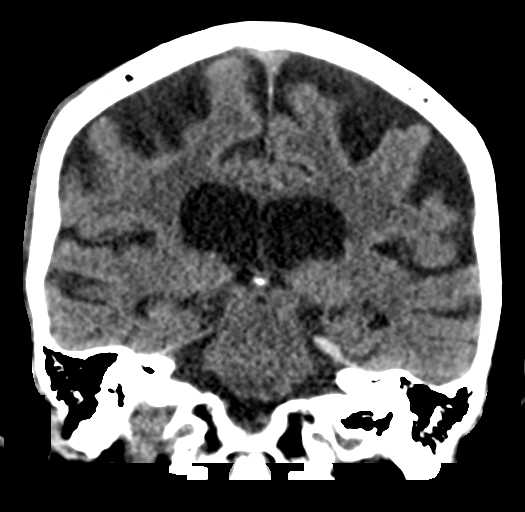
[im 37/67  brain]
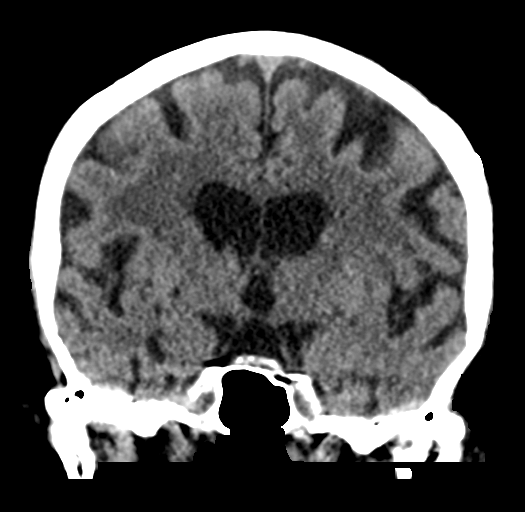

[Series 6: head without sag · sagittal · non-contrast · 0.31mm/px · 3 of 67 slices shown]
[im 23/67  brain]
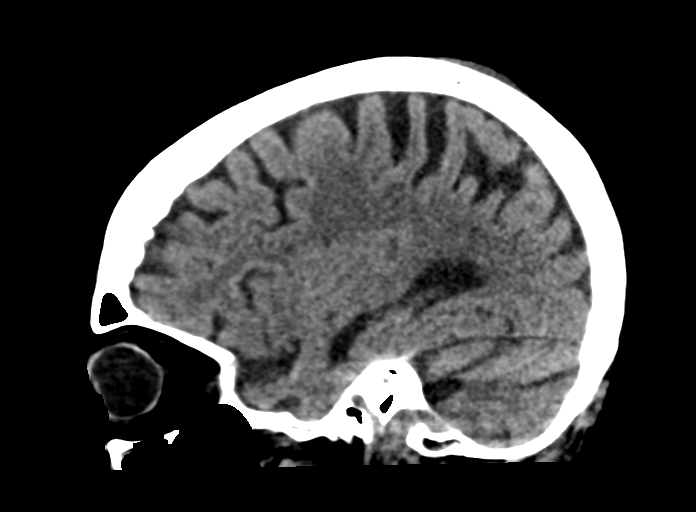
[im 34/67  brain]
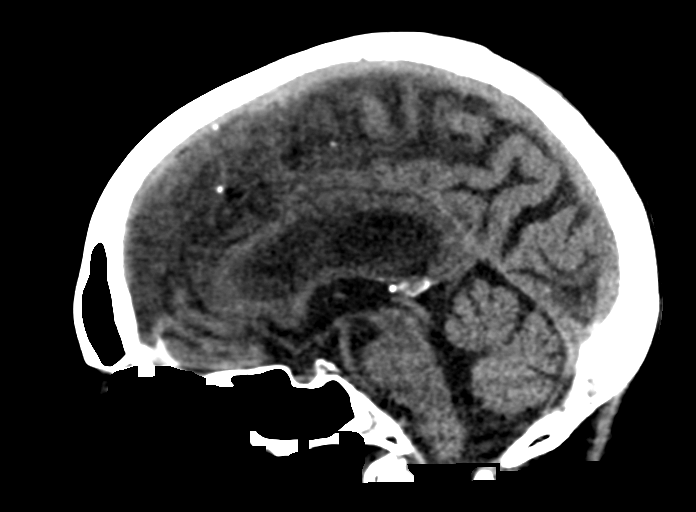
[im 45/67  brain]
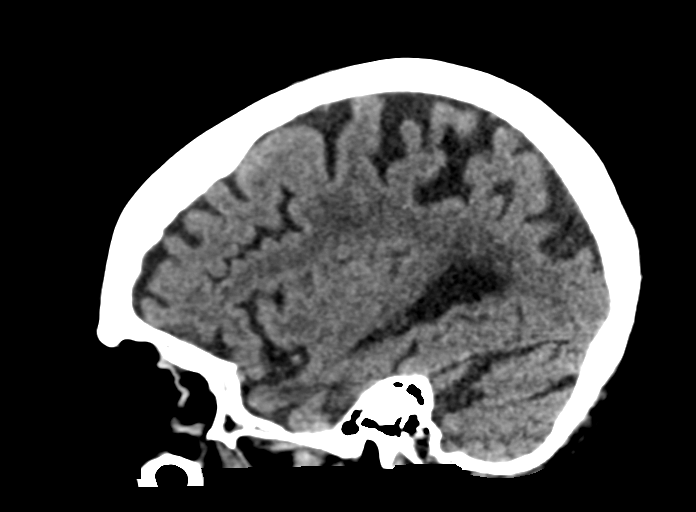

[16 of 47 positions shown; findings below may reference images not displayed]

FINDINGS: Brain: No evidence of acute infarction, hemorrhage, hydrocephalus,
extra-axial collection or mass lesion/mass effect. There is chronic
diffuse atrophy. Chronic bilateral periventricular white matter
small vessel ischemic changes noted. Small old right basal ganglia
lacunar infarction is identified unchanged.

Vascular: No hyperdense vessel or unexpected calcification.

Skull: Deformity from prior left enucleation and left sinonasal
surgery are noted.

Sinuses/Orbits: No acute abnormality. Deformity from prior left
enucleation and left sinonasal surgery are noted.

Other: Right parietal scalp swelling and hematoma is noted.
IMPRESSION: No focal acute intracranial abnormality identified.

Right parietal scalp swelling hematoma is identified.
